# Patient Record
Sex: Female | Born: 1940
Health system: Southern US, Community
[De-identification: ages and names within clinical notes are randomized; demographics above are authoritative.]

## PROBLEM LIST (undated history)

## (undated) DIAGNOSIS — M1990 Unspecified osteoarthritis, unspecified site: Secondary | ICD-10-CM

## (undated) DIAGNOSIS — E162 Hypoglycemia, unspecified: Secondary | ICD-10-CM

## (undated) DIAGNOSIS — IMO0002 Reserved for concepts with insufficient information to code with codable children: Secondary | ICD-10-CM

## (undated) DIAGNOSIS — K589 Irritable bowel syndrome without diarrhea: Secondary | ICD-10-CM

## (undated) DIAGNOSIS — K22711 Barrett's esophagus with high grade dysplasia: Secondary | ICD-10-CM

## (undated) DIAGNOSIS — I959 Hypotension, unspecified: Secondary | ICD-10-CM

## (undated) DIAGNOSIS — I493 Ventricular premature depolarization: Secondary | ICD-10-CM

## (undated) DIAGNOSIS — Z87440 Personal history of urinary (tract) infections: Secondary | ICD-10-CM

## (undated) DIAGNOSIS — K449 Diaphragmatic hernia without obstruction or gangrene: Secondary | ICD-10-CM

## (undated) DIAGNOSIS — M810 Age-related osteoporosis without current pathological fracture: Secondary | ICD-10-CM

## (undated) DIAGNOSIS — M8580 Other specified disorders of bone density and structure, unspecified site: Secondary | ICD-10-CM

## (undated) DIAGNOSIS — N390 Urinary tract infection, site not specified: Secondary | ICD-10-CM

## (undated) DIAGNOSIS — I7300 Raynaud's syndrome without gangrene: Secondary | ICD-10-CM

## (undated) DIAGNOSIS — K2270 Barrett's esophagus without dysplasia: Secondary | ICD-10-CM

## (undated) DIAGNOSIS — E739 Lactose intolerance, unspecified: Secondary | ICD-10-CM

## (undated) DIAGNOSIS — D6851 Activated protein C resistance: Secondary | ICD-10-CM

## (undated) DIAGNOSIS — K529 Noninfective gastroenteritis and colitis, unspecified: Secondary | ICD-10-CM

## (undated) DIAGNOSIS — M419 Scoliosis, unspecified: Secondary | ICD-10-CM

## (undated) HISTORY — DX: Activated protein C resistance: D68.51

## (undated) HISTORY — DX: Urinary tract infection, site not specified: N39.0

## (undated) HISTORY — DX: Ventricular premature depolarization: I49.3

## (undated) HISTORY — DX: Hypotension, unspecified: I95.9

## (undated) HISTORY — DX: Barrett's esophagus with high grade dysplasia: K22.711

## (undated) HISTORY — DX: Hypoglycemia, unspecified: E16.2

## (undated) HISTORY — PX: BREAST EXCISIONAL BIOPSY: SUR124

## (undated) HISTORY — DX: Personal history of urinary (tract) infections: Z87.440

## (undated) HISTORY — DX: *Unknown: ERROR EN

## (undated) HISTORY — DX: Other specified disorders of bone density and structure, unspecified site: M85.80

## (undated) HISTORY — DX: Unspecified osteoarthritis, unspecified site: M19.90

---
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
## 1997-10-25 ENCOUNTER — Ambulatory Visit (HOSPITAL_COMMUNITY): Admission: RE | Admit: 1997-10-25 | Discharge: 1997-10-25 | Payer: Self-pay | Admitting: *Deleted

## 1998-06-23 ENCOUNTER — Other Ambulatory Visit: Admission: RE | Admit: 1998-06-23 | Discharge: 1998-06-23 | Payer: Self-pay | Admitting: *Deleted

## 1998-12-13 ENCOUNTER — Ambulatory Visit (HOSPITAL_COMMUNITY): Admission: RE | Admit: 1998-12-13 | Discharge: 1998-12-13 | Payer: Self-pay | Admitting: Internal Medicine

## 1998-12-13 ENCOUNTER — Encounter: Payer: Self-pay | Admitting: Internal Medicine

## 1998-12-22 ENCOUNTER — Other Ambulatory Visit: Admission: RE | Admit: 1998-12-22 | Discharge: 1998-12-22 | Payer: Self-pay | Admitting: *Deleted

## 1999-06-21 ENCOUNTER — Encounter: Payer: Self-pay | Admitting: Internal Medicine

## 1999-06-21 ENCOUNTER — Encounter: Admission: RE | Admit: 1999-06-21 | Discharge: 1999-06-21 | Payer: Self-pay | Admitting: Internal Medicine

## 1999-07-07 ENCOUNTER — Other Ambulatory Visit: Admission: RE | Admit: 1999-07-07 | Discharge: 1999-07-07 | Payer: Self-pay | Admitting: *Deleted

## 2000-06-21 ENCOUNTER — Encounter: Payer: Self-pay | Admitting: Internal Medicine

## 2000-06-21 ENCOUNTER — Encounter: Admission: RE | Admit: 2000-06-21 | Discharge: 2000-06-21 | Payer: Self-pay | Admitting: Internal Medicine

## 2000-07-05 ENCOUNTER — Other Ambulatory Visit: Admission: RE | Admit: 2000-07-05 | Discharge: 2000-07-05 | Payer: Self-pay | Admitting: *Deleted

## 2001-02-19 ENCOUNTER — Encounter (INDEPENDENT_AMBULATORY_CARE_PROVIDER_SITE_OTHER): Payer: Self-pay

## 2001-02-19 ENCOUNTER — Ambulatory Visit (HOSPITAL_BASED_OUTPATIENT_CLINIC_OR_DEPARTMENT_OTHER): Admission: RE | Admit: 2001-02-19 | Discharge: 2001-02-19 | Payer: Self-pay | Admitting: General Surgery

## 2001-06-24 ENCOUNTER — Encounter: Payer: Self-pay | Admitting: Internal Medicine

## 2001-06-24 ENCOUNTER — Encounter: Admission: RE | Admit: 2001-06-24 | Discharge: 2001-06-24 | Payer: Self-pay | Admitting: Internal Medicine

## 2001-07-08 ENCOUNTER — Other Ambulatory Visit: Admission: RE | Admit: 2001-07-08 | Discharge: 2001-07-08 | Payer: Self-pay | Admitting: *Deleted

## 2002-06-27 ENCOUNTER — Encounter: Payer: Self-pay | Admitting: Internal Medicine

## 2002-06-27 ENCOUNTER — Encounter: Admission: RE | Admit: 2002-06-27 | Discharge: 2002-06-27 | Payer: Self-pay | Admitting: Internal Medicine

## 2002-08-05 ENCOUNTER — Ambulatory Visit (HOSPITAL_COMMUNITY): Admission: RE | Admit: 2002-08-05 | Discharge: 2002-08-05 | Payer: Self-pay | Admitting: Internal Medicine

## 2002-08-05 ENCOUNTER — Encounter: Payer: Self-pay | Admitting: Internal Medicine

## 2003-03-27 ENCOUNTER — Ambulatory Visit (HOSPITAL_COMMUNITY): Admission: RE | Admit: 2003-03-27 | Discharge: 2003-03-27 | Payer: Self-pay | Admitting: Gastroenterology

## 2003-03-27 ENCOUNTER — Encounter (INDEPENDENT_AMBULATORY_CARE_PROVIDER_SITE_OTHER): Payer: Self-pay | Admitting: *Deleted

## 2003-06-30 ENCOUNTER — Encounter: Admission: RE | Admit: 2003-06-30 | Discharge: 2003-06-30 | Payer: Self-pay | Admitting: Internal Medicine

## 2003-09-01 ENCOUNTER — Encounter: Admission: RE | Admit: 2003-09-01 | Discharge: 2003-09-01 | Payer: Self-pay | Admitting: Internal Medicine

## 2003-09-01 ENCOUNTER — Other Ambulatory Visit: Admission: RE | Admit: 2003-09-01 | Discharge: 2003-09-01 | Payer: Self-pay | Admitting: Internal Medicine

## 2004-08-31 ENCOUNTER — Other Ambulatory Visit: Admission: RE | Admit: 2004-08-31 | Discharge: 2004-08-31 | Payer: Self-pay | Admitting: Internal Medicine

## 2004-09-01 ENCOUNTER — Encounter: Admission: RE | Admit: 2004-09-01 | Discharge: 2004-09-01 | Payer: Self-pay | Admitting: Internal Medicine

## 2004-10-13 ENCOUNTER — Encounter: Admission: RE | Admit: 2004-10-13 | Discharge: 2004-10-13 | Payer: Self-pay | Admitting: Gastroenterology

## 2004-10-20 ENCOUNTER — Encounter: Admission: RE | Admit: 2004-10-20 | Discharge: 2004-10-20 | Payer: Self-pay | Admitting: Gastroenterology

## 2004-11-15 ENCOUNTER — Encounter: Admission: RE | Admit: 2004-11-15 | Discharge: 2004-11-15 | Payer: Self-pay | Admitting: Internal Medicine

## 2005-11-17 ENCOUNTER — Encounter: Admission: RE | Admit: 2005-11-17 | Discharge: 2005-11-17 | Payer: Self-pay | Admitting: Internal Medicine

## 2006-11-20 ENCOUNTER — Encounter: Admission: RE | Admit: 2006-11-20 | Discharge: 2006-11-20 | Payer: Self-pay | Admitting: Family Medicine

## 2007-11-21 ENCOUNTER — Encounter: Admission: RE | Admit: 2007-11-21 | Discharge: 2007-11-21 | Payer: Self-pay | Admitting: Family Medicine

## 2008-11-23 ENCOUNTER — Ambulatory Visit: Payer: Self-pay | Admitting: Diagnostic Radiology

## 2008-11-23 ENCOUNTER — Ambulatory Visit (HOSPITAL_BASED_OUTPATIENT_CLINIC_OR_DEPARTMENT_OTHER): Admission: RE | Admit: 2008-11-23 | Discharge: 2008-11-23 | Payer: Self-pay | Admitting: Family Medicine

## 2009-12-21 ENCOUNTER — Ambulatory Visit: Payer: Self-pay | Admitting: Diagnostic Radiology

## 2009-12-21 ENCOUNTER — Ambulatory Visit (HOSPITAL_BASED_OUTPATIENT_CLINIC_OR_DEPARTMENT_OTHER): Admission: RE | Admit: 2009-12-21 | Discharge: 2009-12-21 | Payer: Self-pay | Admitting: Family Medicine

## 2010-12-12 ENCOUNTER — Other Ambulatory Visit (HOSPITAL_BASED_OUTPATIENT_CLINIC_OR_DEPARTMENT_OTHER): Payer: Self-pay | Admitting: Family Medicine

## 2010-12-12 DIAGNOSIS — Z1231 Encounter for screening mammogram for malignant neoplasm of breast: Secondary | ICD-10-CM

## 2011-01-02 ENCOUNTER — Ambulatory Visit (HOSPITAL_BASED_OUTPATIENT_CLINIC_OR_DEPARTMENT_OTHER)
Admission: RE | Admit: 2011-01-02 | Discharge: 2011-01-02 | Disposition: A | Discharge status: Home / Self Care | Payer: Medicare Other | Source: Ambulatory Visit | Attending: Family Medicine | Admitting: Family Medicine

## 2011-01-02 DIAGNOSIS — Z1231 Encounter for screening mammogram for malignant neoplasm of breast: Secondary | ICD-10-CM | POA: Insufficient documentation

## 2011-01-14 ENCOUNTER — Emergency Department (HOSPITAL_BASED_OUTPATIENT_CLINIC_OR_DEPARTMENT_OTHER)
Admission: EM | Admit: 2011-01-14 | Discharge: 2011-01-14 | Disposition: A | Discharge status: Home / Self Care | Payer: Medicare Other | Attending: Emergency Medicine | Admitting: Emergency Medicine

## 2011-01-14 ENCOUNTER — Encounter: Payer: Self-pay | Admitting: Emergency Medicine

## 2011-01-14 DIAGNOSIS — K589 Irritable bowel syndrome without diarrhea: Secondary | ICD-10-CM | POA: Insufficient documentation

## 2011-01-14 DIAGNOSIS — T6391XA Toxic effect of contact with unspecified venomous animal, accidental (unintentional), initial encounter: Secondary | ICD-10-CM | POA: Insufficient documentation

## 2011-01-14 DIAGNOSIS — T63441A Toxic effect of venom of bees, accidental (unintentional), initial encounter: Secondary | ICD-10-CM

## 2011-01-14 DIAGNOSIS — T63461A Toxic effect of venom of wasps, accidental (unintentional), initial encounter: Secondary | ICD-10-CM | POA: Insufficient documentation

## 2011-01-14 HISTORY — DX: Irritable bowel syndrome, unspecified: K58.9

## 2011-01-14 HISTORY — DX: Noninfective gastroenteritis and colitis, unspecified: K52.9

## 2011-01-14 MED ORDER — PREDNISONE 50 MG PO TABS: 50.0000 mg | ORAL_TABLET | Freq: Every day | ORAL | Status: AC

## 2011-01-14 MED ORDER — PREDNISONE 20 MG PO TABS
60.0000 mg | ORAL_TABLET | Freq: Once | ORAL | Status: AC
  Administered 2011-01-14: 60 mg via ORAL
  Filled 2011-01-14: qty 3

## 2011-01-14 NOTE — ED Notes (Signed)
Pt presents with bee sing over both arms with 2-3 inch diameter red raised sites with increased itching x 24hrs

## 2011-01-14 NOTE — ED Provider Notes (Signed)
History     CSN: [id number] Arrival date & time: 01/14/2011  4:19 PM  Chief Complaint  Patient presents with  . Insect Bite    Pt reports being stung by approximately 7 yellow jackets  with large red welps forming over 1 hr at site with no SOB   Patient is a 70 y.o. female presenting with allergic reaction. The history is provided by the patient.  Allergic Reaction The primary symptoms are  rash and urticaria. The primary symptoms do not include wheezing, shortness of breath, cough, diarrhea, dizziness, altered mental status or angioedema. The current episode started yesterday. The problem has been gradually worsening.  The rash is associated with itching.  The onset of the reaction was associated with insect bite/sting. Significant symptoms also include itching.  she denies known h/o severe allergic rxn Reports she was stung by multiple bees  Past Medical History  Diagnosis Date  . Colitis   . IBS (irritable bowel syndrome)     No past surgical history on file.  Family History  Problem Relation Age of Onset  . Cancer Mother   . Cancer Father     History  Substance Use Topics  . Smoking status: Never Smoker   . Smokeless tobacco: Not on file  . Alcohol Use: 0.6 oz/week    1 Glasses of wine per week    OB History    Grav Para Term Preterm Abortions TAB SAB Ect Mult Living                  Review of Systems  Respiratory: Negative for cough, shortness of breath and wheezing.   Gastrointestinal: Negative for diarrhea.  Skin: Positive for itching and rash.  Neurological: Negative for dizziness.  Psychiatric/Behavioral: Negative for altered mental status.    Physical Exam  BP 129/57  Pulse 78  Temp(Src) 97.7 F (36.5 C) (Oral)  Resp 22  SpO2 99%  Physical Exam  Constitutional: well developed, well nourished, no distress Head and Face: normocephalic/atraumatic Eyes: EOMI/PERRL ENMT: mucous membranes moist, uvula midline, no angioedema noted Neck: supple, no  meningeal signs CV: no murmur/rubs/gallops noted Lungs: clear to auscultation bilaterally Abd: soft, nontender GU: normal appearance Extremities: full ROM noted, pulses normal/equal Neuro: awake/alert, no distress, appropriate for age, [id number], no lethargy is noted Skin: area of urticaria to each bicep, and right LE.  No rash to chest/abd/face/neck Psych: appropriate for age  ED Course  Procedures  MDM Nursing notes reviewed and considered in documentation Pt already taking benadryl, will give burst of prednisone Pt reports reaction slowly worsened over past 24 hours No signs of anaphylaxis      Joya Gaskins, MD 01/14/11 1742

## 2011-06-13 HISTORY — PX: ABDOMINAL HYSTERECTOMY: SHX81

## 2011-10-16 ENCOUNTER — Other Ambulatory Visit: Payer: Self-pay | Admitting: Family Medicine

## 2011-10-16 DIAGNOSIS — Z9289 Personal history of other medical treatment: Secondary | ICD-10-CM

## 2011-10-16 DIAGNOSIS — N63 Unspecified lump in unspecified breast: Secondary | ICD-10-CM

## 2011-10-16 HISTORY — DX: Personal history of other medical treatment: Z92.89

## 2011-10-16 HISTORY — PX: OTHER SURGICAL HISTORY: SHX169

## 2011-10-17 ENCOUNTER — Ambulatory Visit
Admission: RE | Admit: 2011-10-17 | Discharge: 2011-10-17 | Disposition: A | Discharge status: Home / Self Care | Payer: Medicare Other | Source: Ambulatory Visit | Attending: Family Medicine | Admitting: Family Medicine

## 2011-10-17 DIAGNOSIS — N63 Unspecified lump in unspecified breast: Secondary | ICD-10-CM

## 2011-11-22 ENCOUNTER — Other Ambulatory Visit: Payer: Self-pay | Admitting: Family Medicine

## 2011-11-22 DIAGNOSIS — Z1231 Encounter for screening mammogram for malignant neoplasm of breast: Secondary | ICD-10-CM

## 2011-11-23 HISTORY — PX: COLONOSCOPY: SHX174

## 2012-01-03 ENCOUNTER — Ambulatory Visit
Admission: RE | Admit: 2012-01-03 | Discharge: 2012-01-03 | Disposition: A | Discharge status: Home / Self Care | Payer: Medicare Other | Source: Ambulatory Visit | Attending: Family Medicine | Admitting: Family Medicine

## 2012-01-03 DIAGNOSIS — Z1231 Encounter for screening mammogram for malignant neoplasm of breast: Secondary | ICD-10-CM

## 2012-05-14 DIAGNOSIS — N8111 Cystocele, midline: Secondary | ICD-10-CM | POA: Insufficient documentation

## 2012-06-24 DIAGNOSIS — D499 Neoplasm of unspecified behavior of unspecified site: Secondary | ICD-10-CM | POA: Insufficient documentation

## 2012-06-24 DIAGNOSIS — M79604 Pain in right leg: Secondary | ICD-10-CM | POA: Insufficient documentation

## 2012-11-26 ENCOUNTER — Other Ambulatory Visit: Payer: Self-pay

## 2012-11-26 DIAGNOSIS — Z1231 Encounter for screening mammogram for malignant neoplasm of breast: Secondary | ICD-10-CM

## 2013-01-16 DIAGNOSIS — M179 Osteoarthritis of knee, unspecified: Secondary | ICD-10-CM | POA: Insufficient documentation

## 2013-01-16 DIAGNOSIS — M1712 Unilateral primary osteoarthritis, left knee: Secondary | ICD-10-CM | POA: Insufficient documentation

## 2013-01-16 DIAGNOSIS — M2240 Chondromalacia patellae, unspecified knee: Secondary | ICD-10-CM | POA: Insufficient documentation

## 2013-01-20 ENCOUNTER — Ambulatory Visit
Admission: RE | Admit: 2013-01-20 | Discharge: 2013-01-20 | Disposition: A | Discharge status: Home / Self Care | Payer: Medicare Other | Source: Ambulatory Visit

## 2013-01-20 DIAGNOSIS — Z1231 Encounter for screening mammogram for malignant neoplasm of breast: Secondary | ICD-10-CM

## 2013-08-03 ENCOUNTER — Encounter (HOSPITAL_BASED_OUTPATIENT_CLINIC_OR_DEPARTMENT_OTHER): Payer: Self-pay | Admitting: Emergency Medicine

## 2013-08-03 ENCOUNTER — Emergency Department (HOSPITAL_BASED_OUTPATIENT_CLINIC_OR_DEPARTMENT_OTHER)
Admission: EM | Admit: 2013-08-03 | Discharge: 2013-08-03 | Disposition: A | Discharge status: Home / Self Care | Payer: Medicare Other | Attending: Emergency Medicine | Admitting: Emergency Medicine

## 2013-08-03 DIAGNOSIS — K589 Irritable bowel syndrome without diarrhea: Secondary | ICD-10-CM | POA: Insufficient documentation

## 2013-08-03 DIAGNOSIS — Z91040 Latex allergy status: Secondary | ICD-10-CM | POA: Insufficient documentation

## 2013-08-03 DIAGNOSIS — N390 Urinary tract infection, site not specified: Secondary | ICD-10-CM | POA: Insufficient documentation

## 2013-08-03 DIAGNOSIS — Z79899 Other long term (current) drug therapy: Secondary | ICD-10-CM | POA: Insufficient documentation

## 2013-08-03 DIAGNOSIS — Z8679 Personal history of other diseases of the circulatory system: Secondary | ICD-10-CM | POA: Insufficient documentation

## 2013-08-03 DIAGNOSIS — IMO0002 Reserved for concepts with insufficient information to code with codable children: Secondary | ICD-10-CM | POA: Insufficient documentation

## 2013-08-03 DIAGNOSIS — Z8739 Personal history of other diseases of the musculoskeletal system and connective tissue: Secondary | ICD-10-CM | POA: Insufficient documentation

## 2013-08-03 DIAGNOSIS — Z880 Allergy status to penicillin: Secondary | ICD-10-CM | POA: Insufficient documentation

## 2013-08-03 HISTORY — DX: Scoliosis, unspecified: M41.9

## 2013-08-03 HISTORY — DX: Lactose intolerance, unspecified: E73.9

## 2013-08-03 HISTORY — DX: Raynaud's syndrome without gangrene: I73.00

## 2013-08-03 HISTORY — DX: Diaphragmatic hernia without obstruction or gangrene: K44.9

## 2013-08-03 HISTORY — DX: Reserved for concepts with insufficient information to code with codable children: IMO0002

## 2013-08-03 HISTORY — DX: Age-related osteoporosis without current pathological fracture: M81.0

## 2013-08-03 HISTORY — DX: Barrett's esophagus without dysplasia: K22.70

## 2013-08-03 LAB — CBC WITH DIFFERENTIAL/PLATELET
Basophils Absolute: 0.1 10*3/uL (ref 0.0–0.1)
Basophils Relative: 1 % (ref 0–1)
Eosinophils Absolute: 0 10*3/uL (ref 0.0–0.7)
Eosinophils Relative: 0 % (ref 0–5)
HCT: 41 % (ref 36.0–46.0)
Hemoglobin: 13.4 g/dL (ref 12.0–15.0)
Lymphocytes Relative: 23 % (ref 12–46)
Lymphs Abs: 1.5 10*3/uL (ref 0.7–4.0)
MCH: 32.2 pg (ref 26.0–34.0)
MCHC: 32.7 g/dL (ref 30.0–36.0)
MCV: 98.6 fL (ref 78.0–100.0)
Monocytes Absolute: 0.7 10*3/uL (ref 0.1–1.0)
Monocytes Relative: 11 % (ref 3–12)
Neutro Abs: 4.3 10*3/uL (ref 1.7–7.7)
Neutrophils Relative %: 65 % (ref 43–77)
Platelets: 280 10*3/uL (ref 150–400)
RBC: 4.16 MIL/uL (ref 3.87–5.11)
RDW: 12.7 % (ref 11.5–15.5)
WBC: 6.7 10*3/uL (ref 4.0–10.5)

## 2013-08-03 LAB — URINALYSIS, ROUTINE W REFLEX MICROSCOPIC
Bilirubin Urine: NEGATIVE
Glucose, UA: NEGATIVE mg/dL
Hgb urine dipstick: NEGATIVE
Ketones, ur: NEGATIVE mg/dL
Nitrite: POSITIVE — AB
Protein, ur: NEGATIVE mg/dL
Specific Gravity, Urine: 1.008 (ref 1.005–1.030)
Urobilinogen, UA: 1 mg/dL (ref 0.0–1.0)
pH: 6 (ref 5.0–8.0)

## 2013-08-03 LAB — BASIC METABOLIC PANEL
BUN: 12 mg/dL (ref 6–23)
CO2: 25 mEq/L (ref 19–32)
Calcium: 9.4 mg/dL (ref 8.4–10.5)
Chloride: 101 mEq/L (ref 96–112)
Creatinine, Ser: 0.7 mg/dL (ref 0.50–1.10)
GFR calc Af Amer: >90 mL/min (ref >90)
GFR calc non Af Amer: 86 mL/min — ABNORMAL LOW (ref >90)
Glucose, Bld: 118 mg/dL — ABNORMAL HIGH (ref 70–99)
Potassium: 4.5 mEq/L (ref 3.7–5.3)
Sodium: 140 mEq/L (ref 137–147)

## 2013-08-03 LAB — URINE MICROSCOPIC-ADD ON

## 2013-08-03 MED ORDER — CEFTRIAXONE SODIUM 1 G IJ SOLR
INTRAMUSCULAR | Status: AC
  Filled 2013-08-03: qty 10

## 2013-08-03 MED ORDER — DEXTROSE 5 % IV SOLN
1.0000 g | Freq: Once | INTRAVENOUS | Status: AC
  Administered 2013-08-03: 1 g via INTRAVENOUS

## 2013-08-03 MED ORDER — CEPHALEXIN 500 MG PO CAPS: 500.0000 mg | ORAL_CAPSULE | Freq: Four times a day (QID) | ORAL | Status: DC

## 2013-08-03 NOTE — Discharge Instructions (Signed)
Urinary Tract Infection  Urinary tract infections (UTIs) can develop anywhere along your urinary tract. Your urinary tract is your body's drainage system for removing wastes and extra water. Your urinary tract includes two kidneys, two ureters, a bladder, and a urethra. Your kidneys are a pair of bean-shaped organs. Each kidney is about the size of your fist. They are located below your ribs, one on each side of your spine.  CAUSES  Infections are caused by microbes, which are microscopic organisms, including fungi, viruses, and bacteria. These organisms are so small that they can only be seen through a microscope. Bacteria are the microbes that most commonly cause UTIs.  SYMPTOMS   Symptoms of UTIs may vary by age and gender of the patient and by the location of the infection. Symptoms in young women typically include a frequent and intense urge to urinate and a painful, burning feeling in the bladder or urethra during urination. Older women and men are more likely to be tired, shaky, and weak and have muscle aches and abdominal pain. A fever may mean the infection is in your kidneys. Other symptoms of a kidney infection include pain in your back or sides below the ribs, nausea, and vomiting.  DIAGNOSIS  To diagnose a UTI, your caregiver will ask you about your symptoms. Your caregiver also will ask to provide a urine sample. The urine sample will be tested for bacteria and white blood cells. White blood cells are made by your body to help fight infection.  TREATMENT   Typically, UTIs can be treated with medication. Because most UTIs are caused by a bacterial infection, they usually can be treated with the use of antibiotics. The choice of antibiotic and length of treatment depend on your symptoms and the type of bacteria causing your infection.  HOME CARE INSTRUCTIONS   If you were prescribed antibiotics, take them exactly as your caregiver instructs you. Finish the medication even if you feel better after you  have only taken some of the medication.   Drink enough water and fluids to keep your urine clear or pale yellow.   Avoid caffeine, tea, and carbonated beverages. They tend to irritate your bladder.   Empty your bladder often. Avoid holding urine for long periods of time.   Empty your bladder before and after sexual intercourse.   After a bowel movement, women should cleanse from front to back. Use each tissue only once.  SEEK MEDICAL CARE IF:    You have back pain.   You develop a fever.   Your symptoms do not begin to resolve within 3 days.  SEEK IMMEDIATE MEDICAL CARE IF:    You have severe back pain or lower abdominal pain.   You develop chills.   You have nausea or vomiting.   You have continued burning or discomfort with urination.  MAKE SURE YOU:    Understand these instructions.   Will watch your condition.   Will get help right away if you are not doing well or get worse.  Document Released: 03/08/2005 Document Revised: 11/28/2011 Document Reviewed: 07/07/2011  Exit[employer] Patient Information 2014 [employer].

## 2013-08-03 NOTE — ED Notes (Signed)
Dysuria since yesterday.  Pt taking azo standard which has helped some.  No known fever.

## 2013-08-03 NOTE — ED Provider Notes (Signed)
CSN: [id number]     Arrival date & time 08/03/13  1105 History   First MD Initiated Contact with Patient 08/03/13 1108     Chief Complaint  Patient presents with  . Dysuria     (Consider location/radiation/quality/duration/timing/severity/associated sxs/prior Treatment) HPI  This is a 73 year old female with a history of frequent UTIs, colitis, IBS who presents with dysuria. Patient reports one-day history of urinary burning and frequency. She is followed by urology at [facility]. Patient states that she took 2 doses of a "left over antibiotic" yesterday. It appears that it was Keflex. Patient denies any fevers, chills, abdominal pain. She denies any flank pain, and nausea, vomiting.  Past Medical History  Diagnosis Date  . Colitis   . IBS (irritable bowel syndrome)   . Hiatal hernia   . Barrett's esophagus   . Osteoporosis   . Scoliosis   . Raynaud's disease   . DDD (degenerative disc disease)   . Lactose intolerance    No past surgical history on file. Family History  Problem Relation Age of Onset  . Cancer Mother   . Cancer Father    History  Substance Use Topics  . Smoking status: Never Smoker   . Smokeless tobacco: Not on file  . Alcohol Use: 0.6 oz/week    1 Glasses of wine per week   OB History   Grav Para Term Preterm Abortions TAB SAB Ect Mult Living                 Review of Systems  Constitutional: Negative for fever.  Respiratory: Negative for chest tightness and shortness of breath.   Cardiovascular: Negative for chest pain.  Gastrointestinal: Negative for nausea, vomiting and abdominal pain.  Genitourinary: Positive for dysuria, urgency and frequency. Negative for flank pain.  Musculoskeletal: Negative for back pain.  Neurological: Negative for headaches.  Psychiatric/Behavioral: Negative for confusion.  All other systems reviewed and are negative.      Allergies  Aspirin; Avelox; Ciprofloxacin hcl; Levofloxacin;  Norfloxacin; Nsaids; Ofloxacin; Penicillins; Tequin; and Latex  Home Medications   Current Outpatient Rx  Name  Route  Sig  Dispense  Refill  . calcium-vitamin D (OSCAL WITH D) 500-200 MG-UNIT per tablet   Oral   Take 1 tablet by mouth 3 (three) times daily.           . cephALEXin (KEFLEX) 500 MG capsule   Oral   Take 500 mg by mouth 3 (three) times daily.           . Cholecalciferol (VITAMIN D) 2000 UNITS tablet   Oral   Take 2,000 Units by mouth daily.           . fluticasone (FLOVENT DISKUS) 50 MCG/BLIST diskus inhaler   Inhalation   Inhale 1 puff into the lungs 2 (two) times daily.         . Glucosamine 500 MG TABS   Oral   Take 1 tablet by mouth 3 (three) times daily.           Marland Kitchen lactase (LACTAID) 3000 UNITS tablet   Oral   Take 1 tablet by mouth 2 (two) times daily.           Earney Navy Bicarbonate (ZEGERID) 20-1100 MG CAPS   Oral   Take 1 capsule by mouth 2 (two) times daily.           . ValACYclovir HCl (VALTREX PO)   Oral   Take 1 tablet  by mouth daily.           . zoledronic acid (RECLAST) 5 MG/100ML SOLN injection   Intravenous   Inject 5 mg into the vein once.         . cephALEXin (KEFLEX) 500 MG capsule   Oral   Take 1 capsule (500 mg total) by mouth 4 (four) times daily.   28 capsule   0   . cycloSPORINE (RESTASIS) 0.05 % ophthalmic emulsion   Both Eyes   Place 1 drop into both eyes 2 (two) times daily.          . diphenhydrAMINE (BENADRYL) 25 MG tablet   Oral   Take 25 mg by mouth every 4 (four) hours as needed. For itching          . hydrOXYzine (ATARAX) 25 MG tablet   Oral   Take 50 mg by mouth every 4 (four) hours as needed. For itching           . Methenamine-Sodium Salicylate (CYSTEX) 008-676.1 MG TABS   Oral   Take 15 mLs by mouth daily.           . Multiple Vitamins-Minerals (MULTIVITAMIN WITH MINERALS) tablet   Oral   Take 1 tablet by mouth daily.            BP 118/68  Pulse 83   Temp(Src) 98.3 F (36.8 C) (Oral)  Resp 20  Ht 5\' 2"  (1.575 m)  Wt 140 lb (63.504 kg)  BMI 25.60 kg/m2  SpO2 98% Physical Exam  Nursing note and vitals reviewed. Constitutional: She is oriented to person, place, and time. No distress.  elderly  HENT:  Head: Normocephalic and atraumatic.  Neck: Neck supple.  Cardiovascular: Normal rate, regular rhythm and normal heart sounds.   No murmur heard. Pulmonary/Chest: Effort normal and breath sounds normal. No respiratory distress. She has no wheezes.  Abdominal: Soft. Bowel sounds are normal. There is no tenderness. There is no rebound and no guarding.  Neurological: She is alert and oriented to person, place, and time.  Skin: Skin is warm and dry.  Psychiatric: She has a normal mood and affect.    ED Course  Procedures (including critical care time) Labs Review Labs Reviewed  URINALYSIS, ROUTINE W REFLEX MICROSCOPIC - Abnormal; Notable for the following:    Color, Urine ORANGE (*)    Nitrite POSITIVE (*)    Leukocytes, UA TRACE (*)    All other components within normal limits  BASIC METABOLIC PANEL - Abnormal; Notable for the following:    Glucose, Bld 118 (*)    GFR calc non Af Amer 86 (*)    All other components within normal limits  CBC WITH DIFFERENTIAL  URINE MICROSCOPIC-ADD ON   Imaging Review No results found.  EKG Interpretation   None       MDM   Final diagnoses:  Urinary tract infection    Patient presents with symptoms consistent with prior UTIs. Has taken 2 doses of Keflex.  She does report some improvement. She is nontoxic-appearing. Lab work is reassuring. Urinalysis is nitrite positive.  I have reviewed patient's urine culture from [facility]. Urine sensitive to ceftriaxone. Patient was given dose of ceftriaxone. She had sensitivities to cephalosporins. Will discharge on Keflex. She is to followup with her urologist.  After history, exam, and medical workup I feel the patient has been appropriately  medically screened and is safe for discharge home. Pertinent diagnoses were discussed with the patient. Patient  was given return precautions.     Merryl Hacker, MD 08/03/13 1501

## 2013-08-04 LAB — URINE CULTURE
Colony Count: NO GROWTH
Culture: NO GROWTH

## 2014-01-20 ENCOUNTER — Other Ambulatory Visit: Payer: Self-pay

## 2014-01-20 DIAGNOSIS — Z1231 Encounter for screening mammogram for malignant neoplasm of breast: Secondary | ICD-10-CM

## 2014-02-04 ENCOUNTER — Ambulatory Visit: Payer: Medicare Other

## 2014-02-10 ENCOUNTER — Encounter (INDEPENDENT_AMBULATORY_CARE_PROVIDER_SITE_OTHER): Payer: Self-pay

## 2014-02-10 ENCOUNTER — Ambulatory Visit
Admission: RE | Admit: 2014-02-10 | Discharge: 2014-02-10 | Disposition: A | Discharge status: Home / Self Care | Payer: Medicare Other | Source: Ambulatory Visit

## 2014-02-10 DIAGNOSIS — Z1231 Encounter for screening mammogram for malignant neoplasm of breast: Secondary | ICD-10-CM

## 2014-11-02 HISTORY — PX: SIGMOIDOSCOPY: SHX6686

## 2015-01-13 ENCOUNTER — Other Ambulatory Visit: Payer: Self-pay

## 2015-01-13 DIAGNOSIS — Z1231 Encounter for screening mammogram for malignant neoplasm of breast: Secondary | ICD-10-CM

## 2015-02-22 ENCOUNTER — Ambulatory Visit
Admission: RE | Admit: 2015-02-22 | Discharge: 2015-02-22 | Disposition: A | Discharge status: Home / Self Care | Payer: Medicare Other | Source: Ambulatory Visit

## 2015-02-22 DIAGNOSIS — Z1231 Encounter for screening mammogram for malignant neoplasm of breast: Secondary | ICD-10-CM

## 2015-02-24 ENCOUNTER — Other Ambulatory Visit: Payer: Self-pay | Admitting: Family Medicine

## 2015-02-24 DIAGNOSIS — R928 Other abnormal and inconclusive findings on diagnostic imaging of breast: Secondary | ICD-10-CM

## 2015-03-12 ENCOUNTER — Ambulatory Visit
Admission: RE | Admit: 2015-03-12 | Discharge: 2015-03-12 | Disposition: A | Discharge status: Home / Self Care | Payer: Medicare Other | Source: Ambulatory Visit | Attending: Family Medicine | Admitting: Family Medicine

## 2015-03-12 ENCOUNTER — Other Ambulatory Visit: Payer: Self-pay | Admitting: Family Medicine

## 2015-03-12 DIAGNOSIS — R928 Other abnormal and inconclusive findings on diagnostic imaging of breast: Secondary | ICD-10-CM

## 2015-12-17 DIAGNOSIS — Z9889 Other specified postprocedural states: Secondary | ICD-10-CM

## 2015-12-17 HISTORY — DX: Other specified postprocedural states: Z98.890

## 2018-07-12 DIAGNOSIS — Z9289 Personal history of other medical treatment: Secondary | ICD-10-CM

## 2018-07-12 HISTORY — DX: Personal history of other medical treatment: Z92.89

## 2018-08-26 LAB — HM COLONOSCOPY

## 2018-11-18 ENCOUNTER — Other Ambulatory Visit: Payer: Self-pay | Admitting: Family Medicine

## 2019-02-11 ENCOUNTER — Other Ambulatory Visit: Payer: Self-pay | Admitting: Family Medicine

## 2019-02-11 DIAGNOSIS — N644 Mastodynia: Secondary | ICD-10-CM

## 2019-03-01 DIAGNOSIS — Z9289 Personal history of other medical treatment: Secondary | ICD-10-CM

## 2019-03-01 HISTORY — DX: Personal history of other medical treatment: Z92.89

## 2019-04-01 DIAGNOSIS — Z9289 Personal history of other medical treatment: Secondary | ICD-10-CM

## 2019-04-01 HISTORY — DX: Personal history of other medical treatment: Z92.89

## 2019-04-14 ENCOUNTER — Other Ambulatory Visit: Payer: Self-pay

## 2019-04-14 ENCOUNTER — Ambulatory Visit
Admission: RE | Admit: 2019-04-14 | Discharge: 2019-04-14 | Disposition: A | Discharge status: Home / Self Care | Payer: Medicare Other | Source: Ambulatory Visit | Attending: Family Medicine | Admitting: Family Medicine

## 2019-04-14 DIAGNOSIS — N644 Mastodynia: Secondary | ICD-10-CM

## 2019-04-14 DIAGNOSIS — Z9289 Personal history of other medical treatment: Secondary | ICD-10-CM

## 2019-04-14 HISTORY — PX: OTHER SURGICAL HISTORY: SHX169

## 2019-04-14 HISTORY — DX: Personal history of other medical treatment: Z92.89

## 2019-04-23 ENCOUNTER — Other Ambulatory Visit: Payer: Self-pay

## 2019-04-23 ENCOUNTER — Emergency Department (HOSPITAL_COMMUNITY): Payer: Medicare Other

## 2019-04-23 ENCOUNTER — Emergency Department (HOSPITAL_COMMUNITY)
Admission: EM | Admit: 2019-04-23 | Discharge: 2019-04-23 | Disposition: A | Discharge status: Home / Self Care | Payer: Medicare Other | Attending: Emergency Medicine | Admitting: Emergency Medicine

## 2019-04-23 ENCOUNTER — Encounter (HOSPITAL_COMMUNITY): Payer: Self-pay

## 2019-04-23 DIAGNOSIS — Z91040 Latex allergy status: Secondary | ICD-10-CM | POA: Diagnosis not present

## 2019-04-23 DIAGNOSIS — R413 Other amnesia: Secondary | ICD-10-CM | POA: Diagnosis not present

## 2019-04-23 DIAGNOSIS — Z79899 Other long term (current) drug therapy: Secondary | ICD-10-CM | POA: Insufficient documentation

## 2019-04-23 DIAGNOSIS — R4182 Altered mental status, unspecified: Secondary | ICD-10-CM | POA: Diagnosis present

## 2019-04-23 LAB — CBC WITH DIFFERENTIAL/PLATELET
Abs Immature Granulocytes: 0.03 10*3/uL (ref 0.00–0.07)
Basophils Absolute: 0.1 10*3/uL (ref 0.0–0.1)
Basophils Relative: 1 %
Eosinophils Absolute: 0.1 10*3/uL (ref 0.0–0.5)
Eosinophils Relative: 1 %
HCT: 38.5 % (ref 36.0–46.0)
Hemoglobin: 12.2 g/dL (ref 12.0–15.0)
Immature Granulocytes: 0 %
Lymphocytes Relative: 26 %
Lymphs Abs: 2.3 10*3/uL (ref 0.7–4.0)
MCH: 32 pg (ref 26.0–34.0)
MCHC: 31.7 g/dL (ref 30.0–36.0)
MCV: 101 fL — ABNORMAL HIGH (ref 80.0–100.0)
Monocytes Absolute: 1.1 10*3/uL — ABNORMAL HIGH (ref 0.1–1.0)
Monocytes Relative: 12 %
Neutro Abs: 5.3 10*3/uL (ref 1.7–7.7)
Neutrophils Relative %: 60 %
Platelets: 251 10*3/uL (ref 150–400)
RBC: 3.81 MIL/uL — ABNORMAL LOW (ref 3.87–5.11)
RDW: 12.5 % (ref 11.5–15.5)
WBC: 9 10*3/uL (ref 4.0–10.5)
nRBC: 0 % (ref 0.0–0.2)

## 2019-04-23 LAB — BASIC METABOLIC PANEL
Anion gap: 7 (ref 5–15)
BUN: 9 mg/dL (ref 8–23)
CO2: 25 mmol/L (ref 22–32)
Calcium: 8.4 mg/dL — ABNORMAL LOW (ref 8.9–10.3)
Chloride: 102 mmol/L (ref 98–111)
Creatinine, Ser: 0.86 mg/dL (ref 0.44–1.00)
GFR calc Af Amer: >60 mL/min (ref >60)
GFR calc non Af Amer: >60 mL/min (ref >60)
Glucose, Bld: 100 mg/dL — ABNORMAL HIGH (ref 70–99)
Potassium: 4.2 mmol/L (ref 3.5–5.1)
Sodium: 134 mmol/L — ABNORMAL LOW (ref 135–145)

## 2019-04-23 NOTE — Discharge Instructions (Addendum)
The testing today is reassuring.  There is no sign of brain tumor or dementia.  Make sure you are taking your usual medicines, getting plenty of rest and eating 3 good meals each day.  Call your PCP for a follow-up appointment to discuss further testing and treatment which may be needed.  She may prefer to send you to a neurologist as well.

## 2019-04-23 NOTE — ED Triage Notes (Signed)
Pt states her and husband went for walk, came back and began reading a book, and could not remember her place in the book, Husband states Pt told him she could not remember taking a walk and memory of the morning.

## 2019-04-23 NOTE — ED Provider Notes (Signed)
[facility] DEPT Provider Note   CSN: [id number] Arrival date & time: 04/23/19  1228     History   Chief Complaint Chief Complaint  Patient presents with  . AMS    HPI Carol Perez is a 78 y.o. female.     Patient is a 78 year old female with a history of IBS, arthritis, colitis, factor V Leiden disease on aspirin who presents today with her husband for abrupt onset of confusion.  Husband states everything was fine this morning and they gone walking in the halls when approximately 45 minutes prior to arrival she came in and told him something was wrong and she could not remember anything.  He states she could not remember anything that happened earlier today her birthdate her phone number or other information that she would normally know.  Of note patient had a similar episode approximately 6 weeks ago and they went to [facility] for this.  Her symptoms did resolve and she went home and had an outpatient work-up with MRI, EEG, carotid ultrasounds, and echo which have all been normal.  Patient has been feeling well until this episode today 45 minutes prior to arrival.  Patient denies any pain, dizziness, lightheadedness, palpitations.  Husband denies any facial droop and patient denies any unilateral weakness numbness or coordination problems.  The history is provided by the patient and the spouse.    Past Medical History:  Diagnosis Date  . Barrett's esophagus   . Colitis   . DDD (degenerative disc disease)   . Hiatal hernia   . IBS (irritable bowel syndrome)   . Lactose intolerance   . Osteoporosis   . Raynaud's disease   . Scoliosis     There are no active problems to display for this patient.   Past Surgical History:  Procedure Laterality Date  . BREAST EXCISIONAL BIOPSY Left 30+ yrs ago     OB History   No obstetric history on file.      Home Medications    Prior to Admission medications   Medication Sig Start Date End  Date Taking? Authorizing Provider  calcium-vitamin D (OSCAL WITH D) 500-200 MG-UNIT per tablet Take 1 tablet by mouth 3 (three) times daily.      [provider]  cephALEXin (KEFLEX) 500 MG capsule Take 500 mg by mouth 3 (three) times daily.      [provider]  cephALEXin (KEFLEX) 500 MG capsule Take 1 capsule (500 mg total) by mouth 4 (four) times daily. 08/03/13   Horton, Barbette Hair, MD  Cholecalciferol (VITAMIN D) 2000 UNITS tablet Take 2,000 Units by mouth daily.      [provider]  cycloSPORINE (RESTASIS) 0.05 % ophthalmic emulsion Place 1 drop into both eyes 2 (two) times daily.     [provider]  diphenhydrAMINE (BENADRYL) 25 MG tablet Take 25 mg by mouth every 4 (four) hours as needed. For itching     [provider]  fluticasone (FLOVENT DISKUS) 50 MCG/BLIST diskus inhaler Inhale 1 puff into the lungs 2 (two) times daily.    [provider]  Glucosamine 500 MG TABS Take 1 tablet by mouth 3 (three) times daily.      [provider]  hydrOXYzine (ATARAX) 25 MG tablet Take 50 mg by mouth every 4 (four) hours as needed. For itching      [provider]  lactase (LACTAID) 3000 UNITS tablet Take 1 tablet by mouth 2 (two) times daily.  [provider]  Methenamine-Sodium Salicylate (CYSTEX) XX123456 MG TABS Take 15 mLs by mouth daily.      [provider]  Multiple Vitamins-Minerals (MULTIVITAMIN WITH MINERALS) tablet Take 1 tablet by mouth daily.      [provider]  Omeprazole-Sodium Bicarbonate (ZEGERID) 20-1100 MG CAPS Take 1 capsule by mouth 2 (two) times daily.      [provider]  ValACYclovir HCl (VALTREX PO) Take 1 tablet by mouth daily.      [provider]  zoledronic acid (RECLAST) 5 MG/100ML SOLN injection Inject 5 mg into the vein once.    [provider]    Family History Family History  Problem Relation Age of Onset  . Cancer Mother   .  Cancer Father     Social History Social History   Tobacco Use  . Smoking status: Never Smoker  . Smokeless tobacco: Never Used  Substance Use Topics  . Alcohol use: Yes    Alcohol/week: 1.0 standard drinks    Types: 1 Glasses of wine per week  . Drug use: No     Allergies   Aspirin, Avelox [moxifloxacin], Ciprofloxacin hcl, Levofloxacin, Norfloxacin, Nsaids, Ofloxacin, Penicillins, Tequin [gatifloxacin], and Latex   Review of Systems Review of Systems  All other systems reviewed and are negative.    Physical Exam Updated Vital Signs BP 139/70   Pulse 71   Temp 97.9 F (36.6 C) (Oral)   Resp 16   Ht 5\' 2"  (1.575 m)   Wt 61.2 kg   SpO2 100%   BMI 24.69 kg/m   Physical Exam Vitals signs and nursing note reviewed.  Constitutional:      General: She is not in acute distress.    Appearance: Normal appearance. She is well-developed and normal weight.  HENT:     Head: Normocephalic and atraumatic.  Eyes:     General: No visual field deficit.    Pupils: Pupils are equal, round, and reactive to light.  Cardiovascular:     Rate and Rhythm: Normal rate and regular rhythm.     Heart sounds: Normal heart sounds. No murmur. No friction rub.  Pulmonary:     Effort: Pulmonary effort is normal.     Breath sounds: Normal breath sounds. No wheezing or rales.  Abdominal:     General: Bowel sounds are normal. There is no distension.     Palpations: Abdomen is soft.     Tenderness: There is no abdominal tenderness. There is no guarding or rebound.  Musculoskeletal: Normal range of motion.        General: No tenderness.     Comments: No edema  Skin:    General: Skin is warm and dry.     Findings: No rash.  Neurological:     Mental Status: She is alert.     Cranial Nerves: No cranial nerve deficit, dysarthria or facial asymmetry.     Sensory: No sensory deficit.     Motor: Motor function is intact. No weakness or pronator drift.     Coordination: Coordination is intact.  Coordination normal. Finger-Nose-Finger Test and Heel to [facility] Test normal.     Gait: Gait is intact.     Comments: Oriented to person place and time.  Still having difficulty recalling short-term memory like what she did earlier today or test that room runs 6 weeks ago.  Psychiatric:        Mood and Affect: Mood normal.        Behavior:  Behavior normal.        Thought Content: Thought content normal.      ED Treatments / Results  Labs (all labs ordered are listed, but only abnormal results are displayed) Labs Reviewed  CBC WITH DIFFERENTIAL/PLATELET  BASIC METABOLIC PANEL  CBG MONITORING, ED    EKG EKG Interpretation  Date/Time:  Wednesday April 23 2019 12:43:35 EST Ventricular Rate:  74 PR Interval:    QRS Duration: 97 QT Interval:  433 QTC Calculation: 481 R Axis:   -13 Text Interpretation: Sinus rhythm Low voltage, precordial leads Borderline repolarization abnormality No previous tracing Artifact Confirmed by Blanchie Dessert [phone]) on 04/23/2019 1:08:27 PM   Radiology No results found.  Procedures Procedures (including critical care time)  Medications Ordered in ED Medications - No data to display   Initial Impression / Assessment and Plan / ED Course  I have reviewed the triage vital signs and the nursing notes.  Pertinent labs & imaging results that were available during my care of the patient were reviewed by me and considered in my medical decision making (see chart for details).        Elderly female presenting today with an acute loss of memory.  Similar symptoms approximately 6 weeks ago without definitive answers.  Patient had multiple testing done including MRI, EEG, echo, carotid Doppler, zio patch all which were reassuring.  Had been a normal day today until symptoms started.  Symptoms are starting to clear now but she still is not able to remember going to the hospital 6 weeks ago or any of the outpatient testing.  She is oriented to person  place and time.  She is able to recall her birthdate and her phone number.  She has no focal deficits on exam.  Blood sugar here today is within normal limits and vital signs are normal.  Do not feel that this is a code stroke.  Will discuss with neurology for further recommendations.  2:28 PM Labs without acute findings.  Patient does report she is also on macrobid and is not sure if that is relevant for recent UTI.  Pt is still having persistent confusion but states now it is waxing and waning.  Spoke with Dr. Sandrea Matte who recommened repeat MRI to r/o stroke but also possible atypical sz.  Final Clinical Impressions(s) / ED Diagnoses   Final diagnoses:  None    ED Discharge Orders    None       Blanchie Dessert, MD 04/23/19 1431

## 2019-04-23 NOTE — ED Provider Notes (Signed)
4:45 PM-checkout from Dr. Maryan Rued to evaluate patient after MRI imaging of the brain.  This is returned and is normal for age.    Patient Vitals for the past 24 hrs:  BP Temp Temp src Pulse Resp SpO2 Height Weight  04/23/19 1658 128/68 - - 66 16 98 % - -  04/23/19 1530 127/63 - - 69 13 97 % - -  04/23/19 1500 127/63 - - 69 13 97 % - -  04/23/19 1445 - - - 71 15 97 % - -  04/23/19 1430 128/70 - - 73 20 97 % - -  04/23/19 1400 129/61 - - 70 14 97 % - -  04/23/19 1345 - - - 70 16 97 % - -  04/23/19 1330 134/62 - - 69 10 98 % - -  04/23/19 1315 - - - 71 (!) 29 99 % - -  04/23/19 1300 131/63 - - 72 18 98 % - -  04/23/19 1257 139/70 97.9 F (36.6 C) Oral 71 16 100 % 5' 2" (1.575 m) 61.2 kg    6:09 PM Reevaluation with update and discussion. After initial assessment and treatment, an updated evaluation reveals at this time the patient is alert, and conversant and states that her memory has almost completely recovered for the events of today.  She feels like this is a fairly similar episode to one that she had a couple of months ago when she was hospitalized and evaluated comprehensively.  She states that she did not see the neurologist as an outpatient following hospitalization but did talk to her on the phone regarding her MRI results then.  She is also met with her PCP since then, and been referred to cardiology who has done a work-up.  She was given reassuring results.  The patient lives in [place], and gets most of her medical care in [place], [place].  We discussed the findings and plan and the patient states that she will follow-up with her PCP for further evaluation and treatment as needed.  During my time with her and her husband, I did notice that the patient had a somewhat short temper and became agitated when her husband was asking her questions while she was thinking about something.  All questions answered. [place] Decision Making: Episode of memory loss,  recurrent.  Evaluation here is reassuring.  No indication for hospitalization at this time.  I doubt that she has had a stroke or is having encephalopathy.  There is no indication for hospitalization at this time.  CRITICAL CARE-no Performed by: Daleen Bo  Nursing Notes Reviewed/ Care Coordinated Applicable Imaging Reviewed Interpretation of Laboratory Data incorporated into ED treatment  The patient appears reasonably screened and/or stabilized for discharge and I doubt any other medical condition or other [facility] requiring further screening, evaluation, or treatment in the ED at this time prior to discharge.  Plan: Home Medications-continue usual; Home Treatments-rest, fluids; return here if the recommended treatment, does not improve the symptoms; Recommended follow up-PCP follow-up for further evaluation treatment in 1 or so weeks.    Daleen Bo, MD 04/23/19 [phone]

## 2019-04-23 NOTE — ED Triage Notes (Signed)
Patient was brought in by her husband. Patient's husband reports that the patient had sudden memory loss approx 45 minutes ago.  Patient is a resident of [facility].  Husband reports that the patient walked up to him and stated, "something is wrong. I can not remember anything."

## 2019-04-23 NOTE — ED Notes (Signed)
Pure wick has been placed. Suction set to 45mmHg.  

## 2019-05-01 DIAGNOSIS — Z780 Asymptomatic menopausal state: Secondary | ICD-10-CM | POA: Insufficient documentation

## 2019-05-01 DIAGNOSIS — K219 Gastro-esophageal reflux disease without esophagitis: Secondary | ICD-10-CM | POA: Insufficient documentation

## 2019-06-25 ENCOUNTER — Other Ambulatory Visit: Payer: Self-pay

## 2019-06-25 ENCOUNTER — Non-Acute Institutional Stay: Payer: Medicare Other | Admitting: Internal Medicine

## 2019-06-25 VITALS — BP 138/80 | Temp 98.5°F | Ht 62.0 in | Wt 138.0 lb

## 2019-06-25 DIAGNOSIS — F419 Anxiety disorder, unspecified: Secondary | ICD-10-CM

## 2019-06-25 DIAGNOSIS — N390 Urinary tract infection, site not specified: Secondary | ICD-10-CM | POA: Diagnosis not present

## 2019-06-25 DIAGNOSIS — M810 Age-related osteoporosis without current pathological fracture: Secondary | ICD-10-CM | POA: Diagnosis not present

## 2019-06-25 DIAGNOSIS — K580 Irritable bowel syndrome with diarrhea: Secondary | ICD-10-CM

## 2019-06-25 DIAGNOSIS — G8929 Other chronic pain: Secondary | ICD-10-CM

## 2019-06-25 DIAGNOSIS — M545 Low back pain, unspecified: Secondary | ICD-10-CM

## 2019-06-25 DIAGNOSIS — K22719 Barrett's esophagus with dysplasia, unspecified: Secondary | ICD-10-CM

## 2019-06-25 DIAGNOSIS — E785 Hyperlipidemia, unspecified: Secondary | ICD-10-CM

## 2019-06-25 MED ORDER — PREMARIN 0.625 MG/GM VA CREA: 1.0000 | TOPICAL_CREAM | VAGINAL | 12 refills | Status: DC

## 2019-06-25 NOTE — Progress Notes (Signed)
Location:  [place] of Service:  Clinic (12)  Provider:   Code Status:  Goals of Care:  Advanced Directives 04/23/2019  Does Patient Have a Medical Advance Directive? No  Would patient like information on creating a medical advance directive? No - Patient declined     Chief Complaint  Patient presents with  . Medical Management of Chronic Issues    Patient here to establish care.     HPI: Patient is a 79 y.o. female seen today for medical management of chronic diseases.   Patient came here today to [place] with our Practice Her Active problems are   ? Anxiety Attack vs TIA  Patient has been having episodes in which she start having Nausea, Palpitations, Dizziness ,SOB and Confusion. It can last from 5 min to 30 min Seen By Neurology Her Mri and EEG have been negative She has been seen by Cardiologist  Her Echo Normal. Zio Patch was negative for any Arrhythmias except Some PVC She says this started when she moved with her husband to friend's home.  They had to sell the house in [place].  There is some amount of anxiety involved neck Is taking three 81 mg Aspirin And was started on Statin but did not tolerate Recurrent UTI Patient has a history of recurrent UTI she says she has had 4-5 UTIs in the past few months.  She is on chronic nitrofurantoin.  Has appointment with the urologist Osteoporosis Is on Prolia Due for repeat Injection in May  IBS with diarrhea and questionable Barrett's Has appointment with [facility] for [facility] Hyperlipidemia Was started on Lipitor 40 mg per her Neurology. But patient started having tremor and does not want to take it anymore Back Pain Takes Neurontin PRN. Was told for surgery but does not want to do that  Has moved in [facility] in June 2020 from [place].  She has a son who lives in [place] and a niece who is the [place] Past Medical History:  Diagnosis Date  . Barrett's esophagus   . Colitis   . DDD  (degenerative disc disease)   . Erroneous encounter - disregard    error   . Erroneous encounter - disregard   . Erroneous encounter - disregard   . Factor 5 Leiden mutation, heterozygous [facility])    Per [place] Patient Packet.  . H/O echocardiogram 04/01/2019   Per [place] Patient Packet.  . Hiatal hernia    Per [place] Patient Packet.  . High grade dysplasia of Barrett's epithelium    Per [facility] New Patient Packet.  Marland Kitchen History of bladder infections    Per [facility] New Patient Packet.  Marland Kitchen History of bone density study 07/12/2018   Per [place] Patient Packet.  Marland Kitchen History of bone density study 07/12/2018   Up coming appointment 07/17/2019, Per [facility] New Patient Packet  . History of endoscopy 12/17/2015   Needed Every 3 years. Scheduled for January 2021. Per [facility] New Patient Packet.   Marland Kitchen History of mammogram 04/14/2019   Per [place] Patient Packet.  Marland Kitchen History of MRI 03/01/2019   By Dr.Gulati/ Neurologist. MRI of Brain. Per [facility] New Patient Packet.  Marland Kitchen History of MRI 04/23/2019   By Dr[facility] Maryan Rued at [facility]. Per [facility] New Patient Packet.  Marland Kitchen History of Papanicolaou smear of cervix 10/16/2011   Per [place] Patient Packet  . Hypotension    Per [facility] New Patient Packet.  . IBS (  irritable bowel syndrome)    Per [facility] New Patient Packet.  . Lactose intolerance   . Osteoarthritis    Per [facility] New Patient Packet.  Marland Kitchen Osteopenia    Per [place] New Patient Packet.  . Osteoporosis   . Raynaud's disease    Per [facility] New Patient Packet.  . Scoliosis    Per [place] New Patient Packet.    Past Surgical History:  Procedure Laterality Date  . ABDOMINAL HYSTERECTOMY  06/13/2011   By Dr[facility]Scherer at [facility]. Per [facility] New Patient Packet.  Marland Kitchen BREAST EXCISIONAL BIOPSY Left 30+ yrs ago  . COLONOSCOPY  11/23/2011   By Dr.McCune at [facility]. Per [place] Patient Packet.  Marland Kitchen SIGMOIDOSCOPY  11/02/2014   Per [place] New Patient Packet  . TONSILLECTOMY  06/12/1948   Per [place] New Patient Packet.     Allergies  Allergen Reactions  . Aspirin Other (See Comments)    Upsets ulcers  . Avelox [Moxifloxacin]   . Ciprofloxacin Hcl   . Levofloxacin   . Norfloxacin   . Nsaids Other (See Comments)    Upsets ulcers  . Ofloxacin   . Penicillins Hives  . Tape   . Tequin [Gatifloxacin]   . Latex Swelling and Rash  . Methylisothiazolinone Rash    Outpatient Encounter Medications as of 06/25/2019  Medication Sig  . aspirin EC 81 MG tablet Take 81 mg by mouth 3 (three) times daily. Take with food. ( enteric coated tablets, had peptic ulcers years ago. ) Per [facility] New Patient Packet.  . B Complex Vitamins (VITAMIN B-COMPLEX) TABS Take 1 tablet by mouth daily.  Marland Kitchen CALCIUM PO Take 1,500 mg by mouth daily.  Marland Kitchen dicyclomine (BENTYL) 10 MG/5ML solution Take 10 mg by mouth as needed. For stomach cramps.  . gabapentin (NEURONTIN) 100 MG capsule Take 100 mg by mouth daily as needed for pain.  . magnesium oxide (MAG-OX) 400 MG tablet Take 400 mg by mouth daily.  Marland Kitchen tiZANidine (ZANAFLEX) 2 MG tablet Take 2 mg by mouth as needed for muscle spasms. For leg cramps.  . valACYclovir (VALTREX) 500 MG tablet Take 500 mg by mouth daily.    No facility-administered encounter medications on file as of 06/25/2019.    Review of Systems:  Review of Systems  Constitutional: Negative.   HENT: Negative.   Respiratory: Negative.   Cardiovascular: Negative.   Gastrointestinal: Positive for diarrhea.  Genitourinary: Positive for frequency and urgency.  Musculoskeletal: Positive for back pain.  Neurological: Negative.   Psychiatric/Behavioral: The patient is nervous/anxious.   All other systems reviewed and are negative.   Health Maintenance  Topic Date Due  . DEXA SCAN  10/14/2007  . PNA vac Low Risk Adult (2 of 2 - PPSV23) 12/12/2015  . TETANUS/TDAP  04/08/2018  . INFLUENZA VACCINE  Completed    Physical Exam: Vitals:   06/25/19 1541  BP: 138/80  Temp: 98.5 F (36.9 C)  Weight: 138 lb (62.6 kg)  Height:  5\' 2"  (1.575 m)   Body mass index is 25.24 kg/m. Physical Exam Constitutional: Oriented to person, place, and time. Well-developed and well-nourished.  HENT:  Head: Normocephalic.  Mouth/Throat: Oropharynx is clear and moist. Had Some wax in her ear Eyes: Pupils are equal, round, and reactive to light.  Neck: Neck supple.  Cardiovascular: Normal rate and normal heart sounds.  No murmur heard. Pulmonary/Chest: Effort normal and breath sounds normal. No respiratory distress. No wheezes. She has no rales.  Abdominal: Soft. Bowel sounds are  normal. No distension. There is no tenderness. There is no rebound.  Musculoskeletal: No edema.  Lymphadenopathy: none Neurological: Alert and oriented to person, place, and time.  No Focal Deficits Gait is stable Does not use Cane. No Falls. No Tremors Skin: Skin is warm and dry.  Psychiatric: Normal mood and affect. Behavior is normal. Thought content normal.   Labs reviewed: Basic Metabolic Panel: Recent Labs    04/23/19 1250  NA 134*  K 4.2  CL 102  CO2 25  GLUCOSE 100*  BUN 9  CREATININE 0.86  CALCIUM 8.4*   Liver Function Tests: No results for input(s): AST, ALT, ALKPHOS, BILITOT, PROT, ALBUMIN in the last 8760 hours. No results for input(s): LIPASE, AMYLASE in the last 8760 hours. No results for input(s): AMMONIA in the last 8760 hours. CBC: Recent Labs    04/23/19 1250  WBC 9.0  NEUTROABS 5.3  HGB 12.2  HCT 38.5  MCV 101.0*  PLT 251   Lipid Panel: No results for input(s): CHOL, HDL, LDLCALC, TRIG, CHOLHDL, LDLDIRECT in the last 8760 hours. No results found for: HGBA1C  Procedures since last visit: No results found.  Assessment/Plan ? Episodes of Confusions/ Anxiety Work Up By cardiology and neurology has been negative so far She is on Aspirin Did not tolerate statin Does want to try something besides Lipitor We discussed the possibility of anxiety.  Trial of antidepressant or Klonopin Patient wants to wait for  now. Recurrent UTI On nitrofurantoin and Methenamine Has appointment with urology Also uses Premarin to help with vaginal atrophy Hyperlipidemia Had tremors with Lipitor.  Is open to try something else Osteoporosis Had DEXA in 01/20 in [place] Do not have access for that She was to schedule a Prolia injection in the office Back pain Uses Neurontin as needed Chronic diarrhea Has appointment with GI   Labs/tests ordered:  * No order type specified * Next appt:  Visit date not found  Total time spent in this patient care encounter was  45_  minutes; greater than 50% of the visit spent counseling patient and staff, reviewing records , Labs and coordinating care for problems addressed at this encounter.

## 2019-06-25 NOTE — Progress Notes (Signed)
New Patient Packet Abstracted by Sadee Osland/RMA.

## 2019-06-26 ENCOUNTER — Encounter: Payer: Self-pay | Admitting: Internal Medicine

## 2019-06-26 DIAGNOSIS — K2270 Barrett's esophagus without dysplasia: Secondary | ICD-10-CM | POA: Insufficient documentation

## 2019-06-26 DIAGNOSIS — E785 Hyperlipidemia, unspecified: Secondary | ICD-10-CM | POA: Insufficient documentation

## 2019-06-26 DIAGNOSIS — M810 Age-related osteoporosis without current pathological fracture: Secondary | ICD-10-CM | POA: Insufficient documentation

## 2019-06-26 DIAGNOSIS — N390 Urinary tract infection, site not specified: Secondary | ICD-10-CM | POA: Insufficient documentation

## 2019-06-26 DIAGNOSIS — M549 Dorsalgia, unspecified: Secondary | ICD-10-CM | POA: Insufficient documentation

## 2019-06-26 DIAGNOSIS — K589 Irritable bowel syndrome without diarrhea: Secondary | ICD-10-CM | POA: Insufficient documentation

## 2019-07-17 LAB — HM DEXA SCAN

## 2019-08-15 ENCOUNTER — Ambulatory Visit (INDEPENDENT_AMBULATORY_CARE_PROVIDER_SITE_OTHER): Payer: Medicare Other | Admitting: Family

## 2019-08-15 ENCOUNTER — Other Ambulatory Visit: Payer: Self-pay

## 2019-08-15 ENCOUNTER — Encounter: Payer: Self-pay | Admitting: Family

## 2019-08-15 VITALS — BP 128/70 | HR 74 | Temp 97.7°F | Wt 139.0 lb

## 2019-08-15 DIAGNOSIS — K1379 Other lesions of oral mucosa: Secondary | ICD-10-CM

## 2019-08-15 MED ORDER — MAGIC MOUTHWASH W/LIDOCAINE: 5.0000 mL | Freq: Three times a day (TID) | ORAL | 0 refills | Status: AC

## 2019-08-15 MED ORDER — VALACYCLOVIR HCL 1 G PO TABS: 500.0000 mg | ORAL_TABLET | Freq: Two times a day (BID) | ORAL | 0 refills | Status: AC

## 2019-08-15 NOTE — Progress Notes (Signed)
Provider: Reign Bartnick FNP-C  Virgie Dad, MD  Patient Care Team: Virgie Dad, MD as PCP - General (Internal Medicine) Rozetta Nunnery, MD as Consulting Physician (Otolaryngology) Luberta Mutter, MD as Consulting Physician (Ophthalmology)  Extended Emergency Contact Information Primary Emergency Contact: Engelbert,William M Address: [address] [place]          [place], [place] [address] Johnnette Litter of [place] Phone: [phone] Mobile Phone: [phone] Relation: Spouse Secondary Emergency Contact: Reeves,Pam    POA Address: [place], [place] [address] Johnnette Litter of [place] Phone: [phone] Relation: Niece Preferred language: English Interpreter needed? No  Code Status:  Full Code  Goals of care: Advanced Directive information Advanced Directives 04/23/2019  Does Patient Have a Medical Advance Directive? No  Would patient like information on creating a medical advance directive? No - Patient declined     Chief Complaint  Patient presents with  . Acute Visit    ulcers in mouth x 1 week     HPI:  Pt is a 79 y.o. female seen today for an acute visit for evaluation of ulcers in mouth x 10 days.she states felt the sores in the mouth after eating pop corn.she describes sores as painful especially when brushing her teeth.Also states tip of the  tongue is sensitive.she has noticed sores on right upper / lower gum.Last night new sore started under the tongue.Her tongue has been brown-black in color though thinks might be from mouth wash.she has had pain with swallowing so took a Benadryl last night. Also took a Valacyclovir 500 mg tablet that she had for previous genital  Herpes.she read about mouth sores online and went and bought some new toothpaste for Gum care and soft toothbrush. She denies any fever,chills or shortness of breath.    Past Medical History:  Diagnosis Date  . Barrett's esophagus   .  Colitis   . DDD (degenerative disc disease)   . Erroneous encounter - disregard    error   . Erroneous encounter - disregard   . Erroneous encounter - disregard   . Factor 5 Leiden mutation, heterozygous [facility])    Per [place] Patient Packet.  . H/O echocardiogram 04/01/2019   Per [place] Patient Packet.  . Hiatal hernia    Per [place] Patient Packet.  . High grade dysplasia of Barrett's epithelium    Per [facility] New Patient Packet.  Marland Kitchen History of bladder infections    Per [facility] New Patient Packet.  Marland Kitchen History of bone density study 07/12/2018   Per [place] Patient Packet.  Marland Kitchen History of bone density study 07/12/2018   Up coming appointment 07/17/2019, Per [facility] New Patient Packet  . History of endoscopy 12/17/2015   Needed Every 3 years. Scheduled for January 2021. Per [facility] New Patient Packet.   Marland Kitchen History of mammogram 04/14/2019   Per [place] Patient Packet.  Marland Kitchen History of MRI 03/01/2019   By Dr.Gulati/ Neurologist. MRI of Brain. Per [facility] New Patient Packet.  Marland Kitchen History of MRI 04/23/2019   By Dr. [facility]an Rued at [facility]. Per [facility] New Patient Packet.  Marland Kitchen History of Papanicolaou smear of cervix 10/16/2011   Per [place] Patient Packet  . Hypotension    Per [facility] New Patient Packet.  . IBS (irritable bowel syndrome)    Per [place] New Patient Packet.  . Lactose intolerance   . Osteoarthritis    Per  [place] New Patient Packet.  Marland Kitchen Osteopenia    Per [place] New Patient Packet.  . Osteoporosis   . Raynaud's disease    Per [facility] New Patient Packet.  . Scoliosis    Per [place] New Patient Packet.   Past Surgical History:  Procedure Laterality Date  . ABDOMINAL HYSTERECTOMY  06/13/2011   By Dr[facility]herer at [facility]. Per [facility] New Patient Packet.  Marland Kitchen BREAST EXCISIONAL BIOPSY Left 30+ yrs ago  . COLONOSCOPY  11/23/2011   By Dr.McCune at [facility]. Per [place] Patient Packet.  Marland Kitchen SIGMOIDOSCOPY  11/02/2014   Per [place] New Patient Packet  . TONSILLECTOMY  06/12/1946   Per [place] New Patient  Packet.    Allergies  Allergen Reactions  . Aspirin Other (See Comments)    Upsets ulcers  . Avelox [Moxifloxacin]   . Ciprofloxacin Hcl   . Levofloxacin   . Norfloxacin   . Nsaids Other (See Comments)    Upsets ulcers  . Ofloxacin   . Penicillins Hives  . Tape   . Tequin [Gatifloxacin]   . Latex Swelling and Rash  . Methylisothiazolinone Rash    Outpatient Encounter Medications as of 08/15/2019  Medication Sig  . aspirin EC 81 MG tablet Take 81 mg by mouth 3 (three) times daily.   . B Complex Vitamins (VITAMIN B-COMPLEX) TABS Take 1 tablet by mouth daily.  Marland Kitchen CALCIUM PO Take 1,500 mg by mouth daily.  . Cholecalciferol (D3-1000 PO) Take by mouth daily. 1,000 IU  . conjugated estrogens (PREMARIN) vaginal cream Place 1 Applicatorful vaginally 3 (three) times a week.  . denosumab (PROLIA) 60 MG/ML SOSY injection Inject 60 mg into the skin every 6 (six) months. Last injection 05/01/2019.  . gabapentin (NEURONTIN) 100 MG capsule Take 100 mg by mouth daily as needed for pain.  . Glucosamine HCl 1000 MG TABS Take 1 tablet by mouth 2 (two) times daily.  Marland Kitchen Lifitegrast (XIIDRA OP) Apply 1 drop to eye 2 (two) times daily.   Marland Kitchen loperamide (IMODIUM) 2 MG capsule Take 2 mg by mouth as needed for diarrhea or loose stools.  . magnesium oxide (MAG-OX) 400 MG tablet Take 400 mg by mouth daily.  . methenamine (HIPREX) 1 g tablet Take 1 g by mouth 2 (two) times daily with a meal. For UTI Prevention. Last UTI: October 20th, November 5th, and December 28th.   . Methenamine-Sodium Salicylate (CYSTEX PO) Take 100 mg by mouth daily. ( Cranberry Concentrate )  . Omeprazole-Sodium Bicarbonate (ZEGERID PO) Take 20 mg by mouth 2 (two) times daily.  . Polyvinyl Alcohol-Povidone (REFRESH OP) Apply 1 drop to eye as needed.  . valACYclovir (VALTREX) 500 MG tablet Take 500 mg by mouth daily.   . [DISCONTINUED] dicyclomine (BENTYL) 10 MG/5ML solution Take 10 mg by mouth as needed. For stomach cramps.  .  [DISCONTINUED] PSYLLIUM FIBER PO Take by mouth 5 (five) times daily.  . [DISCONTINUED] tiZANidine (ZANAFLEX) 2 MG tablet Take 2 mg by mouth as needed for muscle spasms. For leg cramps.   No facility-administered encounter medications on file as of 08/15/2019.    Review of Systems  Constitutional: Negative for appetite change, chills, fatigue and fever.  HENT: Positive for postnasal drip and rhinorrhea. Negative for congestion, sinus pressure, sinus pain, sneezing, sore throat and trouble swallowing.        Chronic runny nose. Mouth sore   Eyes: Positive for visual disturbance. Negative for discharge, redness and itching.       Wears  eye glasses   Respiratory: Negative for cough, chest tightness, shortness of breath and wheezing.   Cardiovascular: Negative for chest pain and leg swelling.       Chronic PVC's   Gastrointestinal: Positive for diarrhea. Negative for abdominal distention, abdominal pain, constipation, nausea and vomiting.       Chronic diarrhea d/t IBS   Skin: Negative for color change, pallor and rash.  Neurological: Negative for dizziness, speech difficulty, light-headedness, numbness and headaches.    Immunization History  Administered Date(s) Administered  . Influenza, High Dose Seasonal PF 03/26/2019  . Moderna SARS-COVID-2 Vaccination 06/16/2019  . Pneumococcal Conjugate-13 12/12/2014  . Tdap 04/08/2008   Pertinent  Health Maintenance Due  Topic Date Due  . DEXA SCAN  10/14/2007  . PNA vac Low Risk Adult (2 of 2 - PPSV23) 12/12/2015  . INFLUENZA VACCINE  Completed   Fall Risk  08/15/2019  Falls in the past year? 0  Number falls in past yr: 0  Injury with Fall? 0   Functional Status Survey:    Vitals:   08/15/19 1323  BP: 128/70  Pulse: 74  Temp: 97.7 F (36.5 C)  TempSrc: Oral  SpO2: 96%  Weight: 139 lb (63 kg)   Body mass index is 25.42 kg/m. Physical Exam Vitals reviewed.  Constitutional:      General: She is not in acute distress.     Appearance: She is not ill-appearing.  HENT:     Head: Normocephalic.     Right Ear: Tympanic membrane, ear canal and external ear normal. There is no impacted cerumen.     Left Ear: Tympanic membrane, ear canal and external ear normal. There is no impacted cerumen.     Nose: Nose normal. No congestion or rhinorrhea.     Mouth/Throat:     Mouth: Mucous membranes are moist. Oral lesions present.     Tongue: Lesions present.     Palate: No lesions.     Pharynx: Oropharynx is clear. No pharyngeal swelling, oropharyngeal exudate or posterior oropharyngeal erythema.     Tonsils: No tonsillar exudate or tonsillar abscesses.     Comments: Whitish ulcer on right upper gum with erythema along gum.red lesion also noted on right lower gum line and tip of tongue and under the tongue.painful to touch.   Eyes:     General: No scleral icterus.       Right eye: No discharge.        Left eye: No discharge.     Extraocular Movements: Extraocular movements intact.     Conjunctiva/sclera: Conjunctivae normal.     Pupils: Pupils are equal, round, and reactive to light.  Neck:     Vascular: No carotid bruit.  Cardiovascular:     Rate and Rhythm: Normal rate and regular rhythm.     Pulses: Normal pulses.     Heart sounds: Normal heart sounds. No murmur. No friction rub. No gallop.   Pulmonary:     Effort: Pulmonary effort is normal. No respiratory distress.     Breath sounds: Normal breath sounds. No wheezing, rhonchi or rales.  Chest:     Chest wall: No tenderness.  Abdominal:     General: Bowel sounds are normal. There is no distension.     Palpations: Abdomen is soft. There is no mass.     Tenderness: There is no abdominal tenderness. There is no right CVA tenderness, left CVA tenderness, guarding or rebound.  Musculoskeletal:        General:  No swelling or tenderness. Normal range of motion.     Cervical back: Normal range of motion. No rigidity or tenderness.     Right lower leg: No edema.      Left lower leg: No edema.  Lymphadenopathy:     Cervical: No cervical adenopathy.  Skin:    General: Skin is warm and dry.     Coloration: Skin is not pale.     Findings: No bruising, erythema or rash.  Neurological:     Mental Status: She is alert and oriented to person, place, and time.     Cranial Nerves: No cranial nerve deficit.     Sensory: No sensory deficit.     Motor: No weakness.     Gait: Gait normal.  Psychiatric:        Mood and Affect: Mood normal.        Behavior: Behavior normal.        Thought Content: Thought content normal.        Judgment: Judgment normal.     Labs reviewed: Recent Labs    04/23/19 1250  NA 134*  K 4.2  CL 102  CO2 25  GLUCOSE 100*  BUN 9  CREATININE 0.86  CALCIUM 8.4*   No results for input(s): AST, ALT, ALKPHOS, BILITOT, PROT, ALBUMIN in the last 8760 hours. Recent Labs    04/23/19 1250  WBC 9.0  NEUTROABS 5.3  HGB 12.2  HCT 38.5  MCV 101.0*  PLT 251   No results found for: TSH No results found for: HGBA1C No results found for: CHOL, HDL, LDLCALC, LDLDIRECT, TRIG, CHOLHDL  Significant Diagnostic Results in last 30 days:  No results found.  Assessment/Plan 1. Mouth sores Afebrile.Lesion worrisome for shingles verse canker sores.discussed patient's lesion with Dr.Reed present at [facility] office who also agrees possible shingles recommeded treating with valacyclovir and mouth wash with lidocaine.I've discussed treat with patient agrees to take Valacyclovir 1Gm twice daily x 7 days. - Advised to swish and spit magic mouth wash 5 ml three times daily x 7 days.Advised to avoid eating 30 minutes before or after using mouth wash.  - valACYclovir (VALTREX) 1000 MG tablet; Take 0.5 tablets (500 mg total) by mouth 2 (two) times daily for 7 days. Then resume 500 mg tablet daily.  Dispense: 7 tablet; Refill: 0 - magic mouthwash w/lidocaine SOLN; Take 5 mLs by mouth 3 (three) times daily for 7 days.  Dispense: 100 mL; Refill: 0 - Advised  to go to ED if she  develops any shortness of breath or choking sensation,or swelling of the tongue - follow up in 5 days with Dr[facility]Gupta at [facility]  Family/ staff Communication: Reviewed plan of care with patient verbalized understanding.  Labs/tests ordered: None   Next Appointment: 5 days with Dr.Gupta   Sandrea Hughs, NP

## 2019-08-15 NOTE — Patient Instructions (Signed)
-   Take Valacyclovir 1 Gm one by mouth twice daily x 7 days  - Swish mouth wash 5 ml by mouth three times daily x 7 days  - Please go to ED if you develop any shortness of breath or choking sensation,or swelling of the tongue

## 2019-08-20 ENCOUNTER — Other Ambulatory Visit: Payer: Self-pay

## 2019-08-20 ENCOUNTER — Encounter: Payer: Self-pay | Admitting: Internal Medicine

## 2019-08-20 ENCOUNTER — Non-Acute Institutional Stay: Payer: Medicare Other | Admitting: Internal Medicine

## 2019-08-20 VITALS — BP 136/88 | HR 84 | Temp 98.0°F | Ht 62.0 in | Wt 138.4 lb

## 2019-08-20 DIAGNOSIS — M810 Age-related osteoporosis without current pathological fracture: Secondary | ICD-10-CM

## 2019-08-20 DIAGNOSIS — E785 Hyperlipidemia, unspecified: Secondary | ICD-10-CM

## 2019-08-20 DIAGNOSIS — K1379 Other lesions of oral mucosa: Secondary | ICD-10-CM

## 2019-08-20 DIAGNOSIS — N390 Urinary tract infection, site not specified: Secondary | ICD-10-CM

## 2019-08-20 DIAGNOSIS — K22719 Barrett's esophagus with dysplasia, unspecified: Secondary | ICD-10-CM

## 2019-08-20 NOTE — Progress Notes (Signed)
Location: [place] of Service:  Clinic (12)  Provider:   Code Status:  Goals of Care:  Advanced Directives 04/23/2019  Does Patient Have a Medical Advance Directive? No  Would patient like information on creating a medical advance directive? No - Patient declined     Chief Complaint  Patient presents with  . Medical Management of Chronic Issues    Follow up on office visit at office due to herpes in mouth.     HPI: Patient is a 79 y.o. female seen today for an acute visit for Follow up of Mouth Ulcers most Likely due to Herpes Has h/o Recurrent UTI, Genital Herpes, Osteoporosis, IBS with Diarrhea, Barrett's Esophagus, Hyperlipidemia ans Back Pain  Mouth Ulcers Was seen in the Clinic on 3/05 for Mouth Ulcers.  Was diagnosed with a mouth herpes.  And started on Vancyclovir. States she feels much better now Patient does have a history of genital herpes and recurrent mouth Ulcers  She was on prophylactic Vancyclovir which she had stopped a year ago.  Other Issues Anxiety Attack / TIA Sya they are better. She still getting them but try to control with Breathing Exercise  Patient has these  episodes in which she start having Nausea, Palpitations, Dizziness ,SOB and Confusion. It can last from 5 min to 30 min Seen By Neurology Her MRI and EEG have been negative She has been seen by Cardiologist  Her Echo Normal. Zio Patch was negative for any Arrhythmias except Some PVC She says this started when she moved with her husband to friend's home.  They had to sell the house in [place].  There is some amount of anxiety involved neck Is taking three 81 mg Aspirin And was started on Statin but did not tolerate  Recurrent UTI Patient has a history of recurrent UTI she says she has had 4-5 UTIs in the past few months.  She is on chronic Methanimine Osteoporosis Is on Prolia Due for repeat Injection in May DEXA in previous PCP  IBS with diarrhea and questionable  Barrett's Has appointment with [facility] for [facility] Hyperlipidemia Was started on Lipitor 40 mg per her Neurology. But patient started having tremor and does not want to take it anymore Back Pain Takes Neurontin PRN. Was told for surgery but does not want to do that  Has moved in [facility] in June 2020 from [place].  She has a son who lives in [place] and a niece who is the [place]   Past Medical History:  Diagnosis Date  . Barrett's esophagus   . Colitis   . DDD (degenerative disc disease)   . Erroneous encounter - disregard    error   . Erroneous encounter - disregard   . Erroneous encounter - disregard   . Factor 5 Leiden mutation, heterozygous [facility])    Per [place] Patient Packet.  . H/O echocardiogram 04/01/2019   Per [place] Patient Packet.  . Hiatal hernia    Per [place] Patient Packet.  . High grade dysplasia of Barrett's epithelium    Per [facility] New Patient Packet.  Marland Kitchen History of bladder infections    Per [facility] New Patient Packet.  Marland Kitchen History of bone density study 07/12/2018   Per [place] Patient Packet.  Marland Kitchen History of bone density study 07/12/2018   Up coming appointment 07/17/2019, Per [facility] New Patient Packet  . History of endoscopy 12/17/2015   Needed Every 3 years. Scheduled for January 2021. Per  [place] New Patient Packet.   Marland Kitchen History of mammogram 04/14/2019   Per [place] Patient Packet.  Marland Kitchen History of MRI 03/01/2019   By Dr.Gulati/ Neurologist. MRI of Brain. Per [facility] New Patient Packet.  Marland Kitchen History of MRI 04/23/2019   By Dr. Maryan Rued at [facility]. Per [facility] New Patient Packet.  Marland Kitchen History of Papanicolaou smear of cervix 10/16/2011   Per [place] Patient Packet  . Hypotension    Per [facility] New Patient Packet.  . IBS (irritable bowel syndrome)    Per [place] New Patient Packet.  . Lactose intolerance   . Osteoarthritis    Per [facility] New Patient Packet.  Marland Kitchen Osteopenia    Per [place] New Patient Packet.  . Osteoporosis   . Raynaud's disease    Per [facility] New Patient  Packet.  . Scoliosis    Per [place] New Patient Packet.    Past Surgical History:  Procedure Laterality Date  . ABDOMINAL HYSTERECTOMY  06/13/2011   By Dr.Scherer at [facility]. Per [facility] New Patient Packet.  Marland Kitchen BREAST EXCISIONAL BIOPSY Left 30+ yrs ago  . COLONOSCOPY  11/23/2011   By Dr.McCune at [facility]. Per [place] Patient Packet.  Marland Kitchen SIGMOIDOSCOPY  11/02/2014   Per [place] New Patient Packet  . TONSILLECTOMY  06/12/1948   Per [place] New Patient Packet.    Allergies  Allergen Reactions  . Aspirin Other (See Comments)    Upsets ulcers  . Avelox [Moxifloxacin]   . Ciprofloxacin Hcl   . Levofloxacin   . Norfloxacin   . Nsaids Other (See Comments)    Upsets ulcers  . Ofloxacin   . Penicillins Hives  . Tape   . Tequin [Gatifloxacin]   . Latex Swelling and Rash  . Methylisothiazolinone Rash    Outpatient Encounter Medications as of 08/20/2019  Medication Sig  . aspirin EC 81 MG tablet Take 81 mg by mouth 3 (three) times daily.   . B Complex Vitamins (VITAMIN B-COMPLEX) TABS Take 1 tablet by mouth daily.  Marland Kitchen CALCIUM PO Take 1,500 mg by mouth daily.  . Cholecalciferol (D3-1000 PO) Take by mouth daily. 2,000 IU  . conjugated estrogens (PREMARIN) vaginal cream Place 1 Applicatorful vaginally 3 (three) times a week.  . denosumab (PROLIA) 60 MG/ML SOSY injection Inject 60 mg into the skin every 6 (six) months. Last injection 05/01/2019.  . gabapentin (NEURONTIN) 100 MG capsule Take 100 mg by mouth daily as needed for pain.  . Glucosamine HCl 1000 MG TABS Take 1 tablet by mouth 2 (two) times daily.  Marland Kitchen Lifitegrast (XIIDRA OP) Apply 1 drop to eye 2 (two) times daily.   Marland Kitchen loperamide (IMODIUM) 2 MG capsule Take 2 mg by mouth as needed for diarrhea or loose stools.  . magic mouthwash w/lidocaine SOLN Take 5 mLs by mouth 3 (three) times daily for 7 days.  . magnesium oxide (MAG-OX) 400 MG tablet Take 400 mg by mouth daily.  . methenamine (HIPREX) 1 g tablet Take 1 g by  mouth 2 (two) times daily with a meal. For UTI Prevention. Last UTI: October 20th, November 5th, and December 28th.   . Methenamine-Sodium Salicylate (CYSTEX PO) Take 100 mg by mouth daily. ( Cranberry Concentrate )  . Omeprazole-Sodium Bicarbonate (ZEGERID PO) Take 20 mg by mouth 2 (two) times daily.  . Polyvinyl Alcohol-Povidone (REFRESH OP) Apply 1 drop to eye as needed.  . valACYclovir (VALTREX) 1000 MG tablet Take 0.5 tablets (500 mg total) by mouth 2 (two)  times daily for 7 days. Then resume 500 mg tablet daily.   No facility-administered encounter medications on file as of 08/20/2019.    Review of Systems:  Review of Systems  Review of Systems  Constitutional: Negative for activity change, appetite change, chills, diaphoresis, fatigue and fever.  HENT: Negative for mouth sores, postnasal drip, rhinorrhea, sinus pain and sore throat.   Respiratory: Negative for apnea, cough, chest tightness, shortness of breath and wheezing.   Cardiovascular: Negative for chest pain, palpitations and leg swelling.  Gastrointestinal: Negative for abdominal distention, abdominal pain, constipation, Does have Diarrhea   Genitourinary: Negative for dysuria and frequency.  Musculoskeletal: Negative for arthralgias, joint swelling and myalgias.  Skin: Negative for rash.  Neurological: Negative for dizziness, syncope, weakness, light-headedness and numbness.  Psychiatric/Behavioral: Negative for behavioral problems, confusion and sleep disturbance.     Health Maintenance  Topic Date Due  . DEXA SCAN  10/14/2007  . PNA vac Low Risk Adult (2 of 2 - PPSV23) 12/12/2015  . TETANUS/TDAP  04/08/2018  . INFLUENZA VACCINE  Completed    Physical Exam: Vitals:   08/20/19 1253  BP: 136/88  Pulse: 84  Temp: 98 F (36.7 C)  SpO2: 97%  Weight: 138 lb 6.4 oz (62.8 kg)  Height: 5\' 2"  (1.575 m)   Body mass index is 25.31 kg/m. Physical Exam  Constitutional: Oriented to person, place, and time.  Well-developed and well-nourished.  HENT:  Head: Normocephalic.  Mouth/Throat: Oropharynx is clear and moist.  Was clear Did not see any Ulcers Eyes: Pupils are equal, round, and reactive to light.  Neck: Neck supple.  Cardiovascular: Normal rate and normal heart sounds.  No murmur heard. Pulmonary/Chest: Effort normal and breath sounds normal. No respiratory distress. No wheezes. She has no rales.  Abdominal: Soft. Bowel sounds are normal. No distension. There is no tenderness. There is no rebound.  Musculoskeletal: No edema.  Lymphadenopathy: none Neurological: Alert and oriented to person, place, and time.  Skin: Skin is warm and dry.  Psychiatric: Normal mood and affect. Behavior is normal. Thought content normal.    Labs reviewed: Basic Metabolic Panel: Recent Labs    04/23/19 1250  NA 134*  K 4.2  CL 102  CO2 25  GLUCOSE 100*  BUN 9  CREATININE 0.86  CALCIUM 8.4*   Liver Function Tests: No results for input(s): AST, ALT, ALKPHOS, BILITOT, PROT, ALBUMIN in the last 8760 hours. No results for input(s): LIPASE, AMYLASE in the last 8760 hours. No results for input(s): AMMONIA in the last 8760 hours. CBC: Recent Labs    04/23/19 1250  WBC 9.0  NEUTROABS 5.3  HGB 12.2  HCT 38.5  MCV 101.0*  PLT 251   Lipid Panel: No results for input(s): CHOL, HDL, LDLCALC, TRIG, CHOLHDL, LDLDIRECT in the last 8760 hours. No results found for: HGBA1C  Procedures since last visit: No results found.  Assessment/Plan  Mouth Ulcers ? Herpes Better on Vancyclovir She was on Suppressive Therapy for Gential Herpes before. Will restart that again.  ? Episodes of Confusions/ Anxiety Work Up By cardiology and neurology has been negative so far She is on Aspirin Did not tolerate statin Will Consider Antianxiety next visit. Right Now she feels she can handle it   Recurrent UTI On  Methenamine Follows  with urology Also uses Premarin to help with vaginal  atrophy Hyperlipidemia Had tremors with Lipitor.  Is open to try something else Repeat Lipid Panel Osteoporosis Repeat BMD Tscore -2.2 Continue Prolia  Back pain  Uses Neurontin as needed Chronic diarrhea Is Having EGD and Colonoscopy Next week with [facility]   Labs/tests ordered:  * No order type specified * Next appt:  09/03/2019

## 2019-08-26 HISTORY — PX: COLONOSCOPY: SHX174

## 2019-09-01 ENCOUNTER — Other Ambulatory Visit: Payer: Self-pay

## 2019-09-01 DIAGNOSIS — M810 Age-related osteoporosis without current pathological fracture: Secondary | ICD-10-CM

## 2019-09-01 DIAGNOSIS — E785 Hyperlipidemia, unspecified: Secondary | ICD-10-CM

## 2019-09-01 LAB — LIPID PANEL
Cholesterol: 228 mg/dL — ABNORMAL HIGH (ref <200)
HDL: 93 mg/dL (ref >=50)
LDL Cholesterol (Calc): 112 mg/dL (calc) — ABNORMAL HIGH
Non-HDL Cholesterol (Calc): 135 mg/dL (calc) — ABNORMAL HIGH (ref <130)
Total CHOL/HDL Ratio: 2.5 (calc) (ref <5.0)
Triglycerides: 115 mg/dL (ref <150)

## 2019-09-01 LAB — CBC WITH DIFFERENTIAL/PLATELET
Absolute Monocytes: 636 cells/uL (ref 200–950)
Basophils Absolute: 101 cells/uL (ref 0–200)
Basophils Relative: 1.6 %
Eosinophils Absolute: 164 cells/uL (ref 15–500)
Eosinophils Relative: 2.6 %
HCT: 41.8 % (ref 35.0–45.0)
Hemoglobin: 13.8 g/dL (ref 11.7–15.5)
Lymphs Abs: 1915 cells/uL (ref 850–3900)
MCH: 31.4 pg (ref 27.0–33.0)
MCHC: 33 g/dL (ref 32.0–36.0)
MCV: 95.2 fL (ref 80.0–100.0)
MPV: 10.1 fL (ref 7.5–12.5)
Monocytes Relative: 10.1 %
Neutro Abs: 3484 cells/uL (ref 1500–7800)
Neutrophils Relative %: 55.3 %
Platelets: 374 10*3/uL (ref 140–400)
RBC: 4.39 10*6/uL (ref 3.80–5.10)
RDW: 12.4 % (ref 11.0–15.0)
Total Lymphocyte: 30.4 %
WBC: 6.3 10*3/uL (ref 3.8–10.8)

## 2019-09-01 LAB — COMPLETE METABOLIC PANEL WITH GFR
AG Ratio: 1.8 (calc) (ref 1.0–2.5)
ALT: 17 U/L (ref 6–29)
AST: 22 U/L (ref 10–35)
Albumin: 4.4 g/dL (ref 3.6–5.1)
Alkaline phosphatase (APISO): 81 U/L (ref 37–153)
BUN: 10 mg/dL (ref 7–25)
CO2: 27 mmol/L (ref 20–32)
Calcium: 9.6 mg/dL (ref 8.6–10.4)
Chloride: 104 mmol/L (ref 98–110)
Creat: 0.76 mg/dL (ref 0.60–0.93)
GFR, Est African American: 88 mL/min/{1.73_m2} (ref >=60)
GFR, Est Non African American: 76 mL/min/{1.73_m2} (ref >=60)
Globulin: 2.4 g/dL (calc) (ref 1.9–3.7)
Glucose, Bld: 102 mg/dL — ABNORMAL HIGH (ref 65–99)
Potassium: 4.2 mmol/L (ref 3.5–5.3)
Sodium: 141 mmol/L (ref 135–146)
Total Bilirubin: 0.5 mg/dL (ref 0.2–1.2)
Total Protein: 6.8 g/dL (ref 6.1–8.1)

## 2019-09-01 LAB — VITAMIN D 25 HYDROXY (VIT D DEFICIENCY, FRACTURES): Vit D, 25-Hydroxy: 51 ng/mL (ref 30–100)

## 2019-09-02 ENCOUNTER — Other Ambulatory Visit: Payer: Self-pay

## 2019-09-02 ENCOUNTER — Encounter (HOSPITAL_COMMUNITY): Payer: Self-pay

## 2019-09-02 ENCOUNTER — Emergency Department (HOSPITAL_COMMUNITY)
Admission: EM | Admit: 2019-09-02 | Discharge: 2019-09-02 | Disposition: A | Discharge status: Home / Self Care | Payer: Medicare Other | Attending: Emergency Medicine | Admitting: Emergency Medicine

## 2019-09-02 ENCOUNTER — Emergency Department (HOSPITAL_COMMUNITY): Payer: Medicare Other

## 2019-09-02 DIAGNOSIS — Z79899 Other long term (current) drug therapy: Secondary | ICD-10-CM | POA: Diagnosis not present

## 2019-09-02 DIAGNOSIS — R519 Headache, unspecified: Secondary | ICD-10-CM | POA: Diagnosis not present

## 2019-09-02 DIAGNOSIS — G500 Trigeminal neuralgia: Secondary | ICD-10-CM

## 2019-09-02 DIAGNOSIS — Z7982 Long term (current) use of aspirin: Secondary | ICD-10-CM | POA: Diagnosis not present

## 2019-09-02 DIAGNOSIS — Z87891 Personal history of nicotine dependence: Secondary | ICD-10-CM | POA: Insufficient documentation

## 2019-09-02 DIAGNOSIS — Z91040 Latex allergy status: Secondary | ICD-10-CM | POA: Insufficient documentation

## 2019-09-02 LAB — CBC WITH DIFFERENTIAL/PLATELET
Abs Immature Granulocytes: 0.03 10*3/uL (ref 0.00–0.07)
Basophils Absolute: 0.1 10*3/uL (ref 0.0–0.1)
Basophils Relative: 1 %
Eosinophils Absolute: 0.1 10*3/uL (ref 0.0–0.5)
Eosinophils Relative: 2 %
HCT: 42.1 % (ref 36.0–46.0)
Hemoglobin: 13.5 g/dL (ref 12.0–15.0)
Immature Granulocytes: 0 %
Lymphocytes Relative: 24 %
Lymphs Abs: 1.7 10*3/uL (ref 0.7–4.0)
MCH: 32 pg (ref 26.0–34.0)
MCHC: 32.1 g/dL (ref 30.0–36.0)
MCV: 99.8 fL (ref 80.0–100.0)
Monocytes Absolute: 0.7 10*3/uL (ref 0.1–1.0)
Monocytes Relative: 10 %
Neutro Abs: 4.4 10*3/uL (ref 1.7–7.7)
Neutrophils Relative %: 63 %
Platelets: 307 10*3/uL (ref 150–400)
RBC: 4.22 MIL/uL (ref 3.87–5.11)
RDW: 13.4 % (ref 11.5–15.5)
WBC: 7 10*3/uL (ref 4.0–10.5)
nRBC: 0 % (ref 0.0–0.2)

## 2019-09-02 LAB — BASIC METABOLIC PANEL
Anion gap: 9 (ref 5–15)
BUN: 12 mg/dL (ref 8–23)
CO2: 27 mmol/L (ref 22–32)
Calcium: 8.8 mg/dL — ABNORMAL LOW (ref 8.9–10.3)
Chloride: 100 mmol/L (ref 98–111)
Creatinine, Ser: 0.74 mg/dL (ref 0.44–1.00)
GFR calc Af Amer: >60 mL/min (ref >60)
GFR calc non Af Amer: >60 mL/min (ref >60)
Glucose, Bld: 100 mg/dL — ABNORMAL HIGH (ref 70–99)
Potassium: 4.2 mmol/L (ref 3.5–5.1)
Sodium: 136 mmol/L (ref 135–145)

## 2019-09-02 MED ORDER — GADOBUTROL 1 MMOL/ML IV SOLN
6.0000 mL | Freq: Once | INTRAVENOUS | Status: AC | PRN
  Administered 2019-09-02: 6 mL via INTRAVENOUS

## 2019-09-02 MED ORDER — GABAPENTIN 100 MG PO CAPS
100.0000 mg | ORAL_CAPSULE | Freq: Once | ORAL | Status: AC
  Administered 2019-09-02: 100 mg via ORAL
  Filled 2019-09-02: qty 1

## 2019-09-02 MED ORDER — GABAPENTIN 300 MG PO CAPS: 300.0000 mg | ORAL_CAPSULE | Freq: Three times a day (TID) | ORAL | 0 refills | Status: DC

## 2019-09-02 NOTE — ED Provider Notes (Signed)
[place] DEPT Provider Note   CSN: [id number] Arrival date & time: 09/02/19  [id number]     History Chief Complaint  Patient presents with  . Headache    GENEVIA VOLPE is a 79 y.o. female.  The history is provided by the patient, medical records and the spouse. No language interpreter was used.  Headache Pain location:  R temporal Quality:  Sharp Radiates to:  Does not radiate Severity currently:  0/10 Severity at highest:  7/10 Onset quality:  Sudden Duration:  2 days Timing:  Sporadic Progression:  Waxing and waning Chronicity:  New Similar to prior headaches: no   Context: not activity, not exposure to bright light, not coughing and not straining   Relieved by:  Nothing Worsened by:  Nothing Ineffective treatments:  None tried Associated symptoms: no abdominal pain, no back pain, no congestion, no cough, no diarrhea, no dizziness, no ear pain, no eye pain, no fatigue, no fever, no focal weakness, no hearing loss, no loss of balance, no myalgias, no nausea, no near-syncope, no neck pain, no neck stiffness, no numbness, no paresthesias, no photophobia, no seizures, no sinus pressure, no sore throat, no tingling, no visual change, no vomiting and no weakness        Past Medical History:  Diagnosis Date  . Barrett's esophagus   . Colitis   . DDD (degenerative disc disease)   . Erroneous encounter - disregard    error   . Erroneous encounter - disregard   . Erroneous encounter - disregard   . Factor 5 Leiden mutation, heterozygous [facility])    Per [place] Patient Packet.  . H/O echocardiogram 04/01/2019   Per [place] Patient Packet.  . Hiatal hernia    Per [place] Patient Packet.  . High grade dysplasia of Barrett's epithelium    Per [facility] New Patient Packet.  Marland Kitchen History of bladder infections    Per [facility] New Patient Packet.  Marland Kitchen History of bone density study 07/12/2018   Per [place] Patient Packet.  Marland Kitchen History of bone density study 07/12/2018   Up coming appointment 07/17/2019, Per [facility] New Patient Packet  . History of endoscopy 12/17/2015   Needed Every 3 years. Scheduled for January 2021. Per [facility] New Patient Packet.   Marland Kitchen History of mammogram 04/14/2019   Per [place] Patient Packet.  Marland Kitchen History of MRI 03/01/2019   By Dr.Gulati/ Neurologist. MRI of Brain. Per [facility] New Patient Packet.  Marland Kitchen History of MRI 04/23/2019   By Dr. Maryan Rued at [facility]. Per [facility] New Patient Packet.  Marland Kitchen History of Papanicolaou smear of cervix 10/16/2011   Per [place] Patient Packet  . Hypotension    Per [facility] New Patient Packet.  . IBS (irritable bowel syndrome)    Per [place] New Patient Packet.  . Lactose intolerance   . Osteoarthritis    Per [facility] New Patient Packet.  Marland Kitchen Osteopenia    Per [place] New Patient Packet.  . Osteoporosis   . Raynaud's disease    Per [facility] New Patient Packet.  . Scoliosis    Per [place] New Patient Packet.    Patient Active Problem List   Diagnosis Date Noted  . Recurrent UTI 06/26/2019  . Osteoporosis 06/26/2019  . IBS (irritable bowel syndrome) 06/26/2019  . Hyperlipidemia 06/26/2019  . Back pain 06/26/2019  . Barrett's esophagus 06/26/2019    Past Surgical History:  Procedure Laterality Date  . ABDOMINAL HYSTERECTOMY  06/13/2011   By  Dr[facility]Scherer at [facility]. Per [facility] New Patient Packet.  Marland Kitchen BREAST EXCISIONAL BIOPSY Left 30+ yrs ago  . COLONOSCOPY  11/23/2011   By Dr.McCune at [facility]. Per [place] Patient Packet.  Marland Kitchen SIGMOIDOSCOPY  11/02/2014   Per [place] New Patient Packet  . TONSILLECTOMY  06/12/1948   Per [place] New Patient Packet.     OB History   No obstetric history on file.     Family History  Problem Relation Age of Onset  . Cancer Mother   . Cancer Father     Social History   Tobacco Use  . Smoking status: Former Smoker    Start date: 06/12/1977  . Smokeless tobacco: Never Used  . Tobacco comment: 42 years ago. Smoked from age [age]  Substance Use Topics  . Alcohol use: Yes     Alcohol/week: 1.0 standard drinks    Types: 1 Glasses of wine per week    Comment: Daily   . Drug use: No    Home Medications Prior to Admission medications   Medication Sig Start Date End Date Taking? Authorizing Provider  aspirin EC 81 MG tablet Take 81 mg by mouth 3 (three) times daily.     [provider]  B Complex Vitamins (VITAMIN B-COMPLEX) TABS Take 1 tablet by mouth daily.    [provider]  Biotin 5000 MCG TABS Take 1 tablet by mouth daily.    [provider]  CALCIUM PO Take 1,500 mg by mouth daily.    [provider]  Cholecalciferol (D3-1000 PO) Take by mouth daily. 2,000 IU    [provider]  conjugated estrogens (PREMARIN) vaginal cream Place 1 Applicatorful vaginally 3 (three) times a week. 06/25/19   Virgie Dad, MD  denosumab (PROLIA) 60 MG/ML SOSY injection Inject 60 mg into the skin every 6 (six) months. Last injection 05/01/2019.    [provider]  gabapentin (NEURONTIN) 100 MG capsule Take 100 mg by mouth daily as needed for pain.    [provider]  Glucosamine HCl 1000 MG TABS Take 1 tablet by mouth 2 (two) times daily.    [provider]  Lifitegrast Shirley Friar OP) Apply 1 drop to eye 2 (two) times daily.     [provider]  loperamide (IMODIUM) 2 MG capsule Take 2 mg by mouth as needed for diarrhea or loose stools.    [provider]  magnesium oxide (MAG-OX) 400 MG tablet Take 400 mg by mouth daily.    [provider]  methenamine (HIPREX) 1 g tablet Take 1 g by mouth 2 (two) times daily with a meal. For UTI Prevention. Last UTI: October 20th, November 5th, and December 28th.     [provider]  Methenamine-Sodium Salicylate (CYSTEX PO) Take 100 mg by mouth daily. ( Cranberry Concentrate )    [provider]  Multiple Vitamins-Minerals (ONE-A-DAY WOMENS PO) Take 1 tablet by mouth daily.    [provider]  Multiple Vitamins-Minerals  (PRESERVISION AREDS 2 PO) Take 1 tablet by mouth daily.    [provider]  NON FORMULARY CBD Full spectrum 1 ml as needed for sleep.    [provider]  Omeprazole-Sodium Bicarbonate (ZEGERID PO) Take 20 mg by mouth 2 (two) times daily.    [provider]  Polyvinyl Alcohol-Povidone (REFRESH OP) Apply 1 drop to eye as needed.    [provider]  Probiotic Product (PROBIOTIC COLON SUPPORT PO) Take 1 tablet by mouth daily.  [provider]  Turmeric (QC TUMERIC COMPLEX) 500 MG CAPS Take 1 capsule by mouth daily.    [provider]    Allergies    Aspirin, Avelox [moxifloxacin], Ciprofloxacin hcl, Levofloxacin, Norfloxacin, Nsaids, Ofloxacin, Penicillins, Tape, Tequin [gatifloxacin], Latex, and Methylisothiazolinone  Review of Systems   Review of Systems  Constitutional: Negative for chills, fatigue and fever.  HENT: Negative for congestion, ear discharge, ear pain, facial swelling, hearing loss, rhinorrhea, sinus pressure, sinus pain, sneezing, sore throat, tinnitus and trouble swallowing.   Eyes: Negative for photophobia, pain, redness, itching and visual disturbance.  Respiratory: Negative for cough, chest tightness, shortness of breath, wheezing and stridor.   Cardiovascular: Negative for chest pain and near-syncope.  Gastrointestinal: Negative for abdominal pain, constipation, diarrhea, nausea and vomiting.  Genitourinary: Negative for dysuria, flank pain and frequency.  Musculoskeletal: Negative for back pain, myalgias, neck pain and neck stiffness.  Skin: Negative for rash and wound.  Neurological: Positive for headaches. Negative for dizziness, focal weakness, seizures, syncope, facial asymmetry, speech difficulty, weakness, light-headedness, numbness, paresthesias and loss of balance.  Psychiatric/Behavioral: Negative for agitation.  All other systems reviewed and are negative.   Physical Exam Updated Vital Signs BP 128/71  (BP Location: Right Arm)   Pulse 86   Temp 97.9 F (36.6 C) (Oral)   Resp 16   Ht 5' 1.5" (1.562 m)   Wt 60.5 kg   SpO2 100%   BMI 24.80 kg/m   Physical Exam Vitals and nursing note reviewed.  Constitutional:      General: She is not in acute distress.    Appearance: Normal appearance. She is well-developed. She is not ill-appearing, toxic-appearing or diaphoretic.  HENT:     Head: Normocephalic and atraumatic.     Right Ear: Tympanic membrane, ear canal and external ear normal. There is no impacted cerumen.     Left Ear: External ear normal.     Nose: Nose normal. No congestion or rhinorrhea.     Mouth/Throat:     Mouth: Mucous membranes are moist.     Pharynx: Oropharynx is clear. No oropharyngeal exudate.  Eyes:     General: No scleral icterus.    Extraocular Movements: Extraocular movements intact.     Right eye: Normal extraocular motion.     Left eye: Normal extraocular motion.     Conjunctiva/sclera: Conjunctivae normal.     Pupils: Pupils are equal, round, and reactive to light.     Right eye: Pupil is round and reactive.     Left eye: Pupil is round and reactive.  Neck:     Vascular: No carotid bruit.  Cardiovascular:     Rate and Rhythm: Normal rate.     Pulses: Normal pulses.     Heart sounds: No murmur.  Pulmonary:     Effort: Pulmonary effort is normal. No respiratory distress.     Breath sounds: No stridor. No wheezing, rhonchi or rales.  Chest:     Chest wall: No tenderness.  Abdominal:     General: Abdomen is flat. There is no distension.     Tenderness: There is no abdominal tenderness. There is no right CVA tenderness, left CVA tenderness or rebound.  Musculoskeletal:        General: No tenderness.     Cervical back: Normal range of motion and neck supple. No rigidity or tenderness.  Skin:    General: Skin is warm.     Capillary Refill: Capillary refill takes less than 2 seconds.  Coloration: Skin is not pale.     Findings: No erythema or  rash.  Neurological:     General: No focal deficit present.     Mental Status: She is alert and oriented to person, place, and time.     Cranial Nerves: No cranial nerve deficit.     Sensory: No sensory deficit.     Motor: No weakness or abnormal muscle tone.     Coordination: Coordination normal.     Gait: Gait normal.     Deep Tendon Reflexes: Reflexes are normal and symmetric.  Psychiatric:        Mood and Affect: Mood normal.     ED Results / Procedures / Treatments   Labs (all labs ordered are listed, but only abnormal results are displayed) Labs Reviewed  BASIC METABOLIC PANEL - Abnormal; Notable for the following components:      Result Value   Glucose, Bld 100 (*)    Calcium 8.8 (*)    All other components within normal limits  CBC WITH DIFFERENTIAL/PLATELET    EKG None  Radiology MR BRAIN W WO CONTRAST  Result Date: 09/02/2019 CLINICAL DATA:  Right-sided headache. EXAM: MRI HEAD WITHOUT AND WITH CONTRAST TECHNIQUE: Multiplanar, multiecho pulse sequences of the brain and surrounding structures were obtained without and with intravenous contrast. CONTRAST:  36mL GADAVIST GADOBUTROL 1 MMOL/ML IV SOLN COMPARISON:  MRI head 03/01/2019 FINDINGS: Brain: Ventricle size and cerebral volume normal for age. Negative for acute infarct. Few small white matter hyperintensities bilaterally stable from the prior MRI. Negative for hemorrhage or mass. Normal enhancement following contrast administration. Vascular: Normal arterial flow voids Skull and upper cervical spine: No focal skeletal lesion. Sinuses/Orbits: Negative Other: None IMPRESSION: No acute abnormality. Mild white matter changes stable since the prior MRI. Electronically Signed   By: Franchot Gallo M.D.   On: 09/02/2019 12:41   MR FACE/TRIGEMINAL WO/W CM  Result Date: 09/02/2019 CLINICAL DATA:  Right-sided headache. Rule out trigeminal neuralgia. EXAM: MRI FACE TRIGEMINAL WITHOUT AND WITH CONTRAST TECHNIQUE: Multiplanar,  multiecho pulse sequences of the face and surrounding structures, including thin slice imaging of the course of the Trigeminal Nerves, were obtained both before and after administration of intravenous contrast. CONTRAST:  26mL GADAVIST GADOBUTROL 1 MMOL/ML IV SOLN COMPARISON:  MRI head 09/02/2019 FINDINGS: High-resolution imaging was obtained through the trigeminal nerve. Normal brainstem. The cisternal segment of the trigeminal nerve is normal bilaterally. No enlargement or mass lesion. No vascular impingement on the nerve. The trigeminal ganglion and cavernous sinus appear normal bilaterally. No mass lesion. Normal skull base. Negative orbit and sinuses bilaterally. Brain findings reported separately. IMPRESSION: Normal. Trigeminal nerve and cavernous sinus. No tumor or vascular impingement on the trigeminal nerve. Electronically Signed   By: Franchot Gallo M.D.   On: 09/02/2019 12:05    Procedures Procedures (including critical care time)  Medications Ordered in ED Medications  gadobutrol (GADAVIST) 1 MMOL/ML injection 6 mL (6 mLs Intravenous Contrast Given 09/02/19 1142)  gabapentin (NEURONTIN) capsule 100 mg (100 mg Oral Given 09/02/19 1319)    ED Course  I have reviewed the triage vital signs and the nursing notes.  Pertinent labs & imaging results that were available during my care of the patient were reviewed by me and considered in my medical decision making (see chart for details).    MDM Rules/Calculators/A&P                      MAKYAH SHELMAN is a  79 y.o. female with a past medical history significant for irritable bowel syndrome, hyperlipidemia, Barrett's esophagus, osteoporosis, factor V Leiden, and recent shingles/herpes in the mouth who presents with right-sided headache.  Patient reports that 2 weeks ago she had ulcers in her mouth that was found to be likely either shingles or recurrent herpes.  Patient has been on valacyclovir over the years but stopped it and was restarted on it  with these ulcers.  She reports ulcers have resolved but yesterday started having right-sided headache.  She reports it is just above her ear and is shooting and sharp in quality.  It lasts for a second but is extremely severe.  It then goes away for several months before coming back.  She denies any nausea, vomiting, visual changes.  She denies any neck pain or neck stiffness that is new.  She denies any focal neurologic deficits related to the symptoms.  She denies any abdominal pain, chest pain, palpitations.  No shortness of breath or fevers or chills.  No Covid symptoms.  She denies any other rashes on her right face or the rest of her body.  She reports that she was able to sleep last night and did not have symptoms and she did take some gabapentin yesterday which she is unsure if helped or not.  She does report history of migraines when she was younger but denies this being similar.  She also reports is not feel similar to sinus headache she has had in the past.  No photophobia or phonophobia.  No recent trauma or injuries.  On exam, she did not have tenderness in the portal area, I could not provoke her pains.  Several times during the exam she had these shooting fleeting pains.  No focal neurologic deficits my exam.  Normal gait.  Pupils are symmetric and reactive with normal extraocular movements.  No rash on the face and ear exam showed no rash in the ear.  No mastoid tenderness.  No neck tenderness.  No bruit.  Exam otherwise unremarkable.  Based on patient's description of symptoms I am concerned about trigeminal neuralgia as the cause of her discomfort.  Lower suspicion for stroke, mass, giant cell arteritis, or other etiology this time.  Spoke briefly with neurology who recommended MRI IAC with contrast to rule out other concerning etiologies.  If the MRI is reassuring, neurology recommended increasing her gabapentin to 100 3 times daily followed by likely increasing to 300 3 times daily and  follow-up with outpatient neurology.  He would recommend that she is already on gabapentin and to increase this as opposed to adding Tegretol at this time.  Anticipate reassessment after MRI.  1:15 PM Labs and imaging is reassuring.  I suspect trigeminal neuralgia as cause of symptoms.  Patient will be given a dose of gabapentin here and will be instructed to increase her gabapentin up to 100 three times a day and escalate to 300 three times a day in a few days.  She also follow-up with outpatient neurology.  Will place ambulatory referral and give her the instructions.   Final Clinical Impression(s) / ED Diagnoses Final diagnoses:  Acute nonintractable headache, unspecified headache type  Trigeminal neuralgia of right side of face    Rx / DC Orders ED Discharge Orders         Ordered    gabapentin (NEURONTIN) 300 MG capsule  3 times daily     09/02/19 1318    Ambulatory referral to Neurology  Comments: An appointment is requested in approximately: 1 week  Patient has likely trigeminal neuralgia.  Spoke with Dr. Malen Gauze in the emergency department who recommended close outpatient neurology follow-up.   09/02/19 1318          Clinical Impression: 1. Acute nonintractable headache, unspecified headache type   2. Trigeminal neuralgia of right side of face     Disposition: Discharge  Condition: Good  I have discussed the results, Dx and Tx plan with the pt(& family if present). He/she/they expressed understanding and agree(s) with the plan. Discharge instructions discussed at great length. Strict return precautions discussed and pt &/or family have verbalized understanding of the instructions. No further questions at time of discharge.    New Prescriptions   GABAPENTIN (NEURONTIN) 300 MG CAPSULE    Take 1 capsule (300 mg total) by mouth 3 (three) times daily.    Follow Up: [facility] [address]     [place]  [phone] [facility] [place] [phone] mc [address] [place]       Dhanya Bogle, Gwenyth Allegra, MD 09/02/19 1320

## 2019-09-02 NOTE — ED Triage Notes (Signed)
Patient c/o right sided headache since yesterday. Patient denies any N/V or blurred vision.

## 2019-09-02 NOTE — Discharge Instructions (Signed)
Your history, exam, and work-up lead [place] to believe that your pain in the right side of your head is from trigeminal neuralgia.  After speaking with neurology, they recommended increasing your gabapentin to taking 100 mg 3 times a day and in several days increasing it to 300 mg 3 times a day.  I also recommend following up outpatient neurology clinic for further management.  If you have any new or worsened symptoms including stroke symptoms such as new numbness, tingling, weakness, or vision changes, please return to nearest emergency department.

## 2019-09-02 NOTE — ED Notes (Signed)
Patient transported to MRI 

## 2019-09-03 ENCOUNTER — Encounter: Payer: Self-pay | Admitting: Internal Medicine

## 2019-09-08 ENCOUNTER — Other Ambulatory Visit: Payer: Self-pay

## 2019-09-08 ENCOUNTER — Encounter: Payer: Self-pay | Admitting: Neurology

## 2019-09-08 ENCOUNTER — Ambulatory Visit: Payer: Medicare Other | Admitting: Neurology

## 2019-09-08 VITALS — BP 119/72 | HR 72 | Temp 98.0°F | Ht 61.5 in | Wt 140.0 lb

## 2019-09-08 DIAGNOSIS — M5481 Occipital neuralgia: Secondary | ICD-10-CM

## 2019-09-08 DIAGNOSIS — M7918 Myalgia, other site: Secondary | ICD-10-CM | POA: Diagnosis not present

## 2019-09-08 DIAGNOSIS — M542 Cervicalgia: Secondary | ICD-10-CM

## 2019-09-08 MED ORDER — GABAPENTIN 100 MG PO CAPS: 100.0000 mg | ORAL_CAPSULE | Freq: Three times a day (TID) | ORAL | 6 refills | Status: DC | PRN

## 2019-09-08 NOTE — Progress Notes (Signed)
[facility]    Provider:  Dr Jaynee Eagles Requesting Provider: Tegeler, Gwenyth Allegra,* Primary Care Provider:  Virgie Dad, MD  CC:  Scalp pain  HPI:  Carol Perez is a 79 y.o. female here as requested by Tegeler, Gwenyth Allegra,* for head pain. She woke up one morning with pain in the right scalp. Impulses. They got severe that evening, she couldn't sleep, she tried CBD and melatonin, shooting pain behind the ear, not burning, she took Gabapentin (she had some for her low back pain and sciatica). She has seen a neurosurgeon and he was worried about her neck. She has a lot of neck pain. The pain is gone. The symptoms are better and she has not had it for a few days. Her low back is better. But she has a lot of neck problems, she has seen a neurosurgeon in the past. She exercises at least 5 morning a week, she exercises her neck. She has tightness in her neck. She has very tight neck muscles.   Reviewed notes, labs and imaging from outside physicians, which showed:  Personally reviewed images of MRI of the brain and MRI trigeminal protocol, essentially normal for age, some very minimal white matter changes, no vascular compression.  Bmp,cbc unremarkable  Reviewed notes from emergency room visit where she was seen for right temporal sharp pain which does not radiate she reported 7 out of 10 pain which was sudden and lasting for 2 days waxing and waning no worsening to bright light coughing or straining, no other associated symptoms, examination and neurologic examination were unremarkable and nonfocal.  She did not have tenderness in the area, her pains could not be provoked, there were no rashes or focal deficits on exam, normal gait, they were concerned for trigeminal neuralgia.  She was given gabapentin 3 times daily.  Review of Systems: Patient complains of symptoms per HPI as well as the following symptoms: neck pain, muscle tightness. Pertinent negatives and positives per  HPI. All others negative.   Social History   Socioeconomic History  . Marital status: Married    Spouse name: Not on file  . Number of children: 2  . Years of education: Not on file  . Highest education level: Associate degree: occupational, Hotel manager, or vocational program  Occupational History  . Occupation: Retired   Tobacco Use  . Smoking status: Former Smoker    Start date: 06/12/1977  . Smokeless tobacco: Never Used  . Tobacco comment: 42 years ago. Smoked from age [age]  Substance and Sexual Activity  . Alcohol use: Not Currently    Alcohol/week: 7.0 standard drinks    Types: 7 Glasses of wine per week    Comment: was a glass of wine daily  . Drug use: No  . Sexual activity: Not on file  Other Topics Concern  . Not on file  Social History Narrative   Tobacco use, amount per day now: No   Past tobacco use, amount per day: Smoked ages [age]   How many years did you use tobacco: 17-20 years   Alcohol use (drinks per week): 1 glass of Red Wine per day.   Diet: Good   Do you drink/eat things with caffeine: No   Marital status: Married                                  What year were you married? 1978   Do  you live in a house, apartment, assisted living, condo, trailer, etc.? Apartment/Independent Living   Is it one or more stories? 1   How many persons live in your home? 2    Do you have pets in your home?( please list) No   Highest Level of Education completed? 2 years of college.    Current or past profession: Retired Futures trader   Do you exercise?  Yes                                Type and how often? 28min workout 5 days week. Walking 3-4 Days a Week.    Do you have a living will? Yes   Do you have a DNR form?  Yes                                 If not, do you want to discuss one?   Do you have signed POA/HPOA forms? Yes                       If so, please bring to you appointment      Do you have difficulty bathing or dressing yourself? No   Do you have  difficulty preparing food or eating? No   Do you have difficulty managing your medications? No   Do you have difficulty managing your finances? No   Do you have difficulty affording your medications? No       Per [facility] New Patient Packet. Abstracted by Jasmine/RMA.    Social Determinants of Health   Financial Resource Strain:   . Difficulty of Paying Living Expenses:   Food Insecurity:   . Worried About Charity fundraiser in the Last Year:   . Arboriculturist in the Last Year:   Transportation Needs:   . Film/video editor (Medical):   Marland Kitchen Lack of Transportation (Non-Medical):   Physical Activity:   . Days of Exercise per Week:   . Minutes of Exercise per Session:   Stress:   . Feeling of Stress :   Social Connections:   . Frequency of Communication with Friends and Family:   . Frequency of Social Gatherings with Friends and Family:   . Attends Religious Services:   . Active Member of Clubs or Organizations:   . Attends Archivist Meetings:   Marland Kitchen Marital Status:   Intimate Partner Violence:   . Fear of Current or Ex-Partner:   . Emotionally Abused:   Marland Kitchen Physically Abused:   . Sexually Abused:     Family History  Problem Relation Age of Onset  . Cancer Mother   . Cancer Father     Past Medical History:  Diagnosis Date  . Barrett's esophagus   . Colitis   . DDD (degenerative disc disease)   . Erroneous encounter - disregard    error   . Erroneous encounter - disregard   . Erroneous encounter - disregard   . Factor 5 Leiden mutation, heterozygous [facility])    Per [place] Patient Packet.  . Frequent UTI   . H/O echocardiogram 04/01/2019   Per [place] Patient Packet.  . Hiatal hernia    Per [place] Patient Packet.  . High grade dysplasia of Barrett's epithelium    Per [facility] New Patient Packet.  Marland Kitchen History of  bladder infections    Per [facility] New Patient Packet.  Marland Kitchen History of bone density study 07/12/2018   Per [place] Patient Packet.  Marland Kitchen History of bone density  study 07/12/2018   Up coming appointment 07/17/2019, Per [facility] New Patient Packet  . History of bone density study 07/17/2019  . History of endoscopy 12/17/2015   Needed Every 3 years. Scheduled for January 2021. Per [facility] New Patient Packet.   Marland Kitchen History of mammogram 04/14/2019   Per [place] Patient Packet.  Marland Kitchen History of MRI 03/01/2019   By Dr.Gulati/ Neurologist. MRI of Brain. Per [facility] New Patient Packet.  Marland Kitchen History of MRI 04/23/2019   By Dr[facility] Maryan Rued at [facility]. Per [facility] New Patient Packet.  Marland Kitchen History of Papanicolaou smear of cervix 10/16/2011   Per [place] Patient Packet  . Hypotension    Per [facility] New Patient Packet.  . IBS (irritable bowel syndrome)    Per [place] New Patient Packet.  . Lactose intolerance   . Osteoarthritis    Per [facility] New Patient Packet.  Marland Kitchen Osteopenia    Per [place] New Patient Packet.  . Osteoporosis   . Premature ventricular complex   . Raynaud's disease    Per [facility] New Patient Packet.  . Scoliosis    Per [place] New Patient Packet.    Patient Active Problem List   Diagnosis Date Noted  . Occipital neuralgia of right side 09/08/2019  . Recurrent UTI 06/26/2019  . Osteoporosis 06/26/2019  . IBS (irritable bowel syndrome) 06/26/2019  . Hyperlipidemia 06/26/2019  . Back pain 06/26/2019  . Barrett's esophagus 06/26/2019    Past Surgical History:  Procedure Laterality Date  . ABDOMINAL HYSTERECTOMY  06/13/2011   By Dr.Scherer at [facility] Per [facility] New Patient Packet.  Marland Kitchen BREAST EXCISIONAL BIOPSY Left 30+ yrs ago  . COLONOSCOPY  11/23/2011   By Dr.McCune at [facility]. Per [place] Patient Packet.  . COLONOSCOPY  08/26/2019  . MAMMOGRAM  04/14/2019  . pap smear  10/16/2011  . SIGMOIDOSCOPY  11/02/2014   Per [place] New Patient Packet  . TONSILLECTOMY  06/12/1948   Per [place] New Patient Packet.    Current Outpatient Medications  Medication Sig Dispense Refill  . aspirin EC 81 MG tablet Take 81 mg by mouth 3 (three) times daily.       . B Complex Vitamins (VITAMIN B-COMPLEX) TABS Take 1 tablet by mouth daily. With vitamin C    . Biotin 5000 MCG TABS Take 1 tablet by mouth daily.    Marland Kitchen CALCIUM PO Take 1,200 mg by mouth daily.     . Cholecalciferol (D3-1000 PO) Take by mouth daily. 2,000 IU    . conjugated estrogens (PREMARIN) vaginal cream Place 1 Applicatorful vaginally 3 (three) times a week. 42.5 g 12  . denosumab (PROLIA) 60 MG/ML SOSY injection Inject 60 mg into the skin every 6 (six) months. Last injection 05/01/2019.    . gabapentin (NEURONTIN) 100 MG capsule Take 100 mg by mouth daily as needed for pain.    Marland Kitchen gabapentin (NEURONTIN) 300 MG capsule Take 1 capsule (300 mg total) by mouth 3 (three) times daily. (Patient taking differently: Take 300 mg by mouth at bedtime. ) 30 capsule 0  . Glucosamine HCl 1000 MG TABS Take 1 tablet by mouth 2 (two) times daily.    Marland Kitchen Lifitegrast (XIIDRA OP) Apply 1 drop to eye 2 (two) times daily.     Marland Kitchen loperamide (IMODIUM) 2 MG capsule  Take 2 mg by mouth as needed for diarrhea or loose stools.    . magnesium oxide (MAG-OX) 400 MG tablet Take 400 mg by mouth daily.    . methenamine (HIPREX) 1 g tablet Take 1 g by mouth 2 (two) times daily with a meal. For UTI Prevention. Last UTI: October 20th, November 5th, and December 28th.     . Methenamine-Sodium Salicylate (CYSTEX PO) Take 100 mg by mouth daily. ( Cranberry Concentrate )    . Multiple Vitamins-Minerals (ONE-A-DAY WOMENS PO) Take 1 tablet by mouth daily.    . Multiple Vitamins-Minerals (PRESERVISION AREDS 2 PO) Take 1 tablet by mouth daily.    . NON FORMULARY CBD Full spectrum 1 ml as needed for sleep.    Marland Kitchen Omeprazole-Sodium Bicarbonate (ZEGERID PO) Take 20 mg by mouth 2 (two) times daily.    . Polyvinyl Alcohol-Povidone (REFRESH OP) Apply 1 drop to eye as needed.    . Probiotic Product (PROBIOTIC COLON SUPPORT PO) Take 1 tablet by mouth daily.    . Turmeric (QC TUMERIC COMPLEX) 500 MG CAPS Take 1 capsule by mouth daily.    .  valACYclovir (VALTREX) 500 MG tablet Take 500 mg by mouth daily as needed (flareup).    . gabapentin (NEURONTIN) 100 MG capsule Take 1 capsule (100 mg total) by mouth 3 (three) times daily as needed. 90 capsule 6   No current facility-administered medications for this visit.    Allergies as of 09/08/2019 - Review Complete 09/08/2019  Allergen Reaction Noted  . Aspirin Other (See Comments) 01/14/2011  . Avelox [moxifloxacin]  08/03/2013  . Banana  09/08/2019  . Ciprofloxacin hcl  08/03/2013  . Levofloxacin  08/03/2013  . Norfloxacin  08/03/2013  . Nsaids Other (See Comments) 01/14/2011  . Ofloxacin  08/03/2013  . Other  09/08/2019  . Penicillins Hives 01/14/2011  . Tape  04/12/2017  . Tequin [gatifloxacin]  08/03/2013  . Latex Swelling and Rash 01/14/2011  . Methylisothiazolinone Rash 04/12/2017    Vitals: BP 119/72 (BP Location: Left Arm, Patient Position: Sitting)   Pulse 72   Temp 98 F (36.7 C) Comment: taken at front  Ht 5' 1.5" (1.562 m)   Wt 140 lb (63.5 kg)   BMI 26.02 kg/m  Last Weight:  Wt Readings from Last 1 Encounters:  09/08/19 140 lb (63.5 kg)   Last Height:   Ht Readings from Last 1 Encounters:  09/08/19 5' 1.5" (1.562 m)     Physical exam: Exam: Gen: NAD, conversant, well nourised, well groomed                     CV: RRR, no MRG. No Carotid Bruits. No peripheral edema, warm, nontender Eyes: Conjunctivae clear without exudates or hemorrhage  Neuro: Detailed Neurologic Exam  Speech:    Speech is normal; fluent and spontaneous with normal comprehension.  Cognition:    The patient is oriented to person, place, and time;     recent and remote memory intact;     language fluent;     normal attention, concentration,     fund of knowledge Cranial Nerves:    The pupils are equal, round, and reactive to light.  Fundi flat. Visual fields are full to finger confrontation. Extraocular movements are intact. Trigeminal sensation is intact and the muscles  of mastication are normal. The face is symmetric. The palate elevates in the midline. Hearing intact. Voice is normal. Shoulder shrug is normal. The tongue has normal motion without fasciculations.  Coordination:    No dysmetria  Gait:    Normal native gait  Motor Observation:    No asymmetry, no atrophy, and no involuntary movements noted. Tone:    Normal muscle tone.    Posture:    Posture is normal. normal erect    Strength:    Strength is V/V in the upper and lower limbs.      Sensation: intact to LT     Reflex Exam:  DTR's:    Deep tendon reflexes in the upper and lower extremities are normal bilaterally.   Toes:    The toes are downgoing bilaterally.   Clonus:    Clonus is absent.    Assessment/Plan: This is a really lovely 79 year old who woke up with scalp pain, sharp, in the occipital area on the right.  By report she has significant arthritic changes in the neck, I think this is likely occipital nerve irritation, the symptoms have resolved, we did talk about her neck pain, at this time nothing concerning to order MRI of the cervical spine, exam is nonfocal, she has no radiating symptoms into the arms and her occipital neuralgia has resolved.  The gabapentin 300 mg was too high a dose for her, I did refill her Neurontin at a lower dose 100 mg.  We did talk about cervical pillows, possible MRI of the cervical spine in the future, injections into the neck such as epidural steroid injections, occipital nerve blocks if needed.  At this time I will send her to physical therapy she does have a lot of cervical muscle hypertrophy and tenderness to palpation but I do think that this is likely occipital neuralgia either due to arthritic changes in the neck or tightness of the cervical musculature.  Dry needling - Dry Needling for occipital neuralgia and very tight musculoskeletal cervical muscles/myofascial cervical pain.  Orders Placed This Encounter  Procedures  . Ambulatory  referral to Physical Therapy   Meds ordered this encounter  Medications  . gabapentin (NEURONTIN) 100 MG capsule    Sig: Take 1 capsule (100 mg total) by mouth 3 (three) times daily as needed.    Dispense:  90 capsule    Refill:  6    Cc: Tegeler, Nickola Major, MD  Sarina Ill, MD  [facility] [address] [place], [place] [address]  Phone [phone] Fax [phone]

## 2019-09-08 NOTE — Patient Instructions (Addendum)
Gabapentin as needed   Occipital Neuralgia  Occipital neuralgia is a type of headache that causes brief episodes of very bad pain in the back of your head. Pain from occipital neuralgia may spread (radiate) to other parts of your head. These headaches may be caused by irritation of the nerves that leave your spinal cord high up in your neck, just below the base of your skull (occipital nerves). Your occipital nerves transmit sensations from the back of your head, the top of your head, and the areas behind your ears. What are the causes? This condition can occur without any known cause (primary headache syndrome). In other cases, this condition is caused by pressure on or irritation of one of the two occipital nerves. Pressure and irritation may be due to:  Muscle spasm in the neck.  Neck injury.  Wear and tear of the vertebrae in the neck (osteoarthritis).  Disease of the disks that separate the vertebrae.  Swollen blood vessels that put pressure on the occipital nerves.  Infections.  Tumors.  Diabetes. What are the signs or symptoms? This condition causes brief burning, stabbing, electric, shocking, or shooting pain which can radiate to the top of the head. It can happen on one side or both sides of the head. It can also cause:  Pain behind the eye.  Pain triggered by neck movement or hair brushing.  Scalp tenderness.  Aching in the back of the head between episodes of very bad pain.  Pain gets worse with exposure to bright lights. How is this diagnosed? There is no test that diagnoses this condition. Your health care provider may diagnose this condition based on a physical exam and your symptoms. Other tests may be done, such as:  Imaging studies of the brain and neck (cervical spine), such as an MRI or CT scan. These look for causes of pinched nerves.  Applying pressure to the nerves in the neck to try to re-create the pain.  Injection of numbing medicine into the  occipital nerve areas to see if pain goes away (diagnostic nerve block). How is this treated? Treatment for this condition may begin with simple measures, such as:  Rest.  Massage.  Applying heat or cold on the area.  Over-the-counter pain relievers. If these measures do not work, you may need other treatments, including:  Medicines, such as: ? Prescription-strength anti-inflammatory medicines. ? Muscle relaxants. ? Anti-seizure medicines, which can relieve pain. ? Antidepressants, which can relieve pain. ? Injected medicines, such as medicines that numb the area (local anesthetic) and steroids.  Pulsed radiofrequency ablation. This is when wires are implanted to deliver electrical impulses that block pain signals from the occipital nerve.  Surgery to relieve nerve pressure.  Physical therapy. Follow these instructions at home: Pain management      Avoid any activities that cause pain.  Rest when you have an attack of pain.  Try gentle massage to relieve pain.  Try a different pillow or sleeping position.  If directed, apply heat to the affected area as told by your health care provider. Use the heat source that your health care provider recommends, such as a moist heat pack or a heating pad. ? Place a towel between your skin and the heat source. ? Leave the heat on for 20-30 minutes. ? Remove the heat if your skin turns bright red. This is especially important if you are unable to feel pain, heat, or cold. You may have a greater risk of getting burned.  If  directed, apply ice to the back of the head and neck area as told by your health care provider. ? Put ice in a plastic bag. ? Place a towel between your skin and the bag. ? Leave the ice on for 20 minutes, 2-3 times per day. General instructions  Take over-the-counter and prescription medicines only as told by your health care provider.  Avoid things that make your symptoms worse, such as bright lights.  Try  to stay active. Get regular exercise that does not cause pain. Ask your health care provider to suggest safe exercises for you.  Work with a physical therapist to learn stretching exercises you can do at home.  Practice good posture.  Keep all follow-up visits as told by your health care provider. This is important. Contact a health care provider if:  Your medicine is not working.  You have new or worsening symptoms. Get help right away if:  You have very bad head pain that does not go away.  You have a sudden change in vision, balance, or speech. Summary  Occipital neuralgia is a type of headache that causes brief episodes of very bad pain in the back of your head.  Pain from occipital neuralgia may spread (radiate) to other parts of your head.  Treatment for this condition includes rest, massage, and medicines. This information is not intended to replace advice given to you by your health care provider. Make sure you discuss any questions you have with your health care provider. Document Revised: 05/15/2017 Document Reviewed: 08/03/2016 Elsevier Patient Education  [place].  Gabapentin capsules or tablets What is this medicine? GABAPENTIN (GA ba pen tin) is used to control seizures in certain types of epilepsy. It is also used to treat certain types of nerve pain. This medicine may be used for other purposes; ask your health care provider or pharmacist if you have questions. COMMON BRAND NAME(S): Active-PAC with Gabapentin, Gabarone, Neurontin What should I tell my health care provider before I take this medicine? They need to know if you have any of these conditions:  history of drug abuse or alcohol abuse problem  kidney disease  lung or breathing disease  suicidal thoughts, plans, or attempt; a previous suicide attempt by you or a family member  an unusual or allergic reaction to gabapentin, other medicines, foods, dyes, or preservatives  pregnant or  trying to get pregnant  breast-feeding How should I use this medicine? Take this medicine by mouth with a glass of water. Follow the directions on the prescription label. You can take it with or without food. If it upsets your stomach, take it with food. Take your medicine at regular intervals. Do not take it more often than directed. Do not stop taking except on your doctor's advice. If you are directed to break the 600 or 800 mg tablets in half as part of your dose, the extra half tablet should be used for the next dose. If you have not used the extra half tablet within 28 days, it should be thrown away. A special MedGuide will be given to you by the pharmacist with each prescription and refill. Be sure to read this information carefully each time. Talk to your pediatrician regarding the use of this medicine in children. While this drug may be prescribed for children as young as 3 years for selected conditions, precautions do apply. Overdosage: If you think you have taken too much of this medicine contact a poison control center or emergency room  at once. NOTE: This medicine is only for you. Do not share this medicine with others. What if I miss a dose? If you miss a dose, take it as soon as you can. If it is almost time for your next dose, take only that dose. Do not take double or extra doses. What may interact with this medicine? This medicine may interact with the following medications:  alcohol  antihistamines for allergy, cough, and cold  certain medicines for anxiety or sleep  certain medicines for depression like amitriptyline, fluoxetine, sertraline  certain medicines for seizures like phenobarbital, primidone  certain medicines for stomach problems  general anesthetics like halothane, isoflurane, methoxyflurane, propofol  local anesthetics like lidocaine, pramoxine, tetracaine  medicines that relax muscles for surgery  narcotic medicines for pain  phenothiazines like  chlorpromazine, mesoridazine, prochlorperazine, thioridazine This list may not describe all possible interactions. Give your health care provider a list of all the medicines, herbs, non-prescription drugs, or dietary supplements you use. Also tell them if you smoke, drink alcohol, or use illegal drugs. Some items may interact with your medicine. What should I watch for while using this medicine? Visit your doctor or health care provider for regular checks on your progress. You may want to keep a record at home of how you feel your condition is responding to treatment. You may want to share this information with your doctor or health care provider at each visit. You should contact your doctor or health care provider if your seizures get worse or if you have any new types of seizures. Do not stop taking this medicine or any of your seizure medicines unless instructed by your doctor or health care provider. Stopping your medicine suddenly can increase your seizures or their severity. This medicine may cause serious skin reactions. They can happen weeks to months after starting the medicine. Contact your health care provider right away if you notice fevers or flu-like symptoms with a rash. The rash may be red or purple and then turn into blisters or peeling of the skin. Or, you might notice a red rash with swelling of the face, lips or lymph nodes in your neck or under your arms. Wear a medical identification bracelet or chain if you are taking this medicine for seizures, and carry a card that lists all your medications. You may get drowsy, dizzy, or have blurred vision. Do not drive, use machinery, or do anything that needs mental alertness until you know how this medicine affects you. To reduce dizzy or fainting spells, do not sit or stand up quickly, especially if you are an older patient. Alcohol can increase drowsiness and dizziness. Avoid alcoholic drinks. Your mouth may get dry. Chewing sugarless gum or  sucking hard candy, and drinking plenty of water will help. The use of this medicine may increase the chance of suicidal thoughts or actions. Pay special attention to how you are responding while on this medicine. Any worsening of mood, or thoughts of suicide or dying should be reported to your health care provider right away. Women who become pregnant while using this medicine may enroll in the [place] Pregnancy Registry by calling [phone]. This registry collects information about the safety of antiepileptic drug use during pregnancy. What side effects may I notice from receiving this medicine? Side effects that you should report to your doctor or health care professional as soon as possible:  allergic reactions like skin rash, itching or hives, swelling of the face, lips, or tongue  breathing problems  rash, fever, and swollen lymph nodes  redness, blistering, peeling or loosening of the skin, including inside the mouth  suicidal thoughts, mood changes Side effects that usually do not require medical attention (report to your doctor or health care professional if they continue or are bothersome):  dizziness  drowsiness  headache  nausea, vomiting  swelling of ankles, feet, hands  tiredness This list may not describe all possible side effects. Call your doctor for medical advice about side effects. You may report side effects to FDA at 1-800-FDA-1088. Where should I keep my medicine? Keep out of reach of children. This medicine may cause accidental overdose and death if it taken by other adults, children, or pets. Mix any unused medicine with a substance like cat litter or coffee grounds. Then throw the medicine away in a sealed container like a sealed bag or a coffee can with a lid. Do not use the medicine after the expiration date. Store at room temperature between 15 and 30 degrees C (59 and 86 degrees F). NOTE: This sheet is a summary. It may not  cover all possible information. If you have questions about this medicine, talk to your doctor, pharmacist, or health care provider.  2020 Elsevier/Gold Standard (2018-08-30 14:16:43)

## 2019-09-26 ENCOUNTER — Ambulatory Visit: Payer: Medicare Other | Attending: Neurology | Admitting: Physical Therapy

## 2019-09-26 ENCOUNTER — Other Ambulatory Visit: Payer: Self-pay

## 2019-09-26 ENCOUNTER — Encounter: Payer: Self-pay | Admitting: Physical Therapy

## 2019-09-26 DIAGNOSIS — M542 Cervicalgia: Secondary | ICD-10-CM

## 2019-09-26 DIAGNOSIS — R293 Abnormal posture: Secondary | ICD-10-CM | POA: Diagnosis present

## 2019-09-26 NOTE — Patient Instructions (Signed)

## 2019-09-26 NOTE — Therapy (Signed)
[place], [place], [address] Phone: [phone]   Fax:  [phone]  Physical Therapy Evaluation  Patient Details  Name: Carol Perez MRN: [id number] Date of Birth: [birth year] Referring Provider (PT): Sarina Ill, MD   Encounter Date: 09/26/2019  PT End of Session - 09/26/19 1457    Visit Number  1    Number of Visits  12    Date for PT Re-Evaluation  11/07/19    Authorization Type  [employer]HC Medicare    PT Start Time  1100    PT Stop Time  1146    PT Time Calculation (min)  46 min    Activity Tolerance  Patient tolerated treatment well    Behavior During Therapy  [facility] for tasks assessed/performed       Past Medical History:  Diagnosis Date  . Barrett's esophagus   . Colitis   . DDD (degenerative disc disease)   . Erroneous encounter - disregard    error   . Erroneous encounter - disregard   . Erroneous encounter - disregard   . Factor 5 Leiden mutation, heterozygous [facility])    Per [place] Patient Packet.  . Frequent UTI   . H/O echocardiogram 04/01/2019   Per [place] Patient Packet.  . Hiatal hernia    Per [place] Patient Packet.  . High grade dysplasia of Barrett's epithelium    Per [facility] New Patient Packet.  Marland Kitchen History of bladder infections    Per [facility] New Patient Packet.  Marland Kitchen History of bone density study 07/12/2018   Per [place] Patient Packet.  Marland Kitchen History of bone density study 07/12/2018   Up coming appointment 07/17/2019, Per [facility] New Patient Packet  . History of bone density study 07/17/2019  . History of endoscopy 12/17/2015   Needed Every 3 years. Scheduled for January 2021. Per [facility] New Patient Packet.   Marland Kitchen History of mammogram 04/14/2019   Per [place] Patient Packet.  Marland Kitchen History of MRI 03/01/2019   By Dr.Gulati/ Neurologist. MRI of Brain. Per [facility] New Patient Packet.  Marland Kitchen History of MRI 04/23/2019   By Dr. Maryan Rued at [facility]. Per [facility] New Patient Packet.  Marland Kitchen History of Papanicolaou  smear of cervix 10/16/2011   Per [place] Patient Packet  . Hypotension    Per [facility] New Patient Packet.  . IBS (irritable bowel syndrome)    Per [place] New Patient Packet.  . Lactose intolerance   . Osteoarthritis    Per [facility] New Patient Packet.  Marland Kitchen Osteopenia    Per [place] New Patient Packet.  . Osteoporosis   . Premature ventricular complex   . Raynaud's disease    Per [facility] New Patient Packet.  . Scoliosis    Per [place] New Patient Packet.    Past Surgical History:  Procedure Laterality Date  . ABDOMINAL HYSTERECTOMY  06/13/2011   By Dr.Scherer at [facility]. Per [facility] New Patient Packet.  Marland Kitchen BREAST EXCISIONAL BIOPSY Left 30+ yrs ago  . COLONOSCOPY  11/23/2011   By Dr.McCune at [facility]. Per [place] Patient Packet.  . COLONOSCOPY  08/26/2019  . MAMMOGRAM  04/14/2019  . pap smear  10/16/2011  . SIGMOIDOSCOPY  11/02/2014   Per [place] New Patient Packet  . TONSILLECTOMY  06/12/1948   Per [place] New Patient Packet.    There were no vitals filed for this visit.   Subjective Assessment - 09/26/19 1437    Subjective  Pt. is a 79 y/o female referred to PT for right occipital neuralgia and cervical myofascial pain. She reports woke up one morning a few weeks ago with acute onset right temporal/occipital region pain-no mechanism of injury noted. She went to ED with brain MRI performed which was (-). Previous multi-year history of back and neck pain issues with chronic neck muscle tension. Pt. reports tried therapy in the past (for back with thoracic region pain) but has had persisted region of pain and tension in right thoracic paraspinals which has not improved and is aggravated with arm use and lifting as well as walking. She saw neurologist with current occipital neuralgia diagnosis with associated myofascial tightness and pain. She reports that head symptoms improved about 3 days after seeing neurologist but has had some persisted myofascial tension and thoracic discomfort as  noted.    Pertinent History  osteoporosis, chronic back/neck pain, scoliosis, Raynaud's, shingles with recent flare up prior to symptom onset, IBS    Limitations  House hold activities;Lifting;Standing;Walking    Diagnostic tests  Brain MRI    Patient Stated Goals  Get rid of pain    Currently in Pain?  No/denies         [facility] PT Assessment - 09/26/19 0001      Assessment   Medical Diagnosis  occipital neuralgia right side, cervical myofascial pain syndrome, cervical arthralgia    Referring Provider (PT)  Sarina Ill, MD    Onset Date/Surgical Date  09/02/19    Prior Therapy  past PT for back/spine pain but no PT yet for current episode      Precautions   Precaution Comments  osteoporosis, Raynaud's-no cryo      Restrictions   Weight Bearing Restrictions  No      Balance Screen   Has the patient fallen in the past 6 months  No      [place] living   Lives at "[facility]" with husband   Home Equipment  Shower seat - built in      Prior Function   Level of [place] with basic ADLs      Cognition   Overall Cognitive Status  Within Functional Limits for tasks assessed      Observation/Other Assessments   Focus on Therapeutic Outcomes (FOTO)   43% limited      Sensation   Light Touch  Appears Intact      Posture/Postural Control   Posture Comments  Forward head with rounded shoulders and right scapula elevated > left, right convex scoilosis noted      ROM / Strength   AROM / PROM / Strength  AROM;Strength      AROM   AROM Assessment Site  Shoulder;Cervical    Right/Left Shoulder  --    Right Shoulder Flexion  160 Degrees    Right Shoulder ABduction  170 Degrees    Right Shoulder Internal Rotation  --   reach to T5   Right Shoulder External Rotation  60 Degrees   at 0 deg abd   Left Shoulder Flexion  160 Degrees    Left Shoulder ABduction  170 Degrees    Left Shoulder Internal Rotation  --   reach to  T5   Left Shoulder External Rotation  60 Degrees   at 0 deg abd   Cervical Flexion  45   "pulling" in upper trapezius region   Cervical Extension  35    Cervical - Right  Side Bend  35    Cervical - Left Side Bend  35    Cervical - Right Rotation  65    Cervical - Left Rotation  70      Strength   Overall Strength Comments  MMTs not tested due to osteoporosis history      Flexibility   Soft Tissue Assessment /Muscle Length  --   general tightness in bilat. cervical/upper trapezius region     Palpation   Palpation comment  Tightness but minimal tenderness to palpation in bilat. upper trapezius, levator, cervical paraspinals and SCM, tight with tenderness to palpation in right thoracic longissimus at T7 region                Objective measurements completed on examination: See above findings.        Trigger Point Dry Needling - 09/26/19 0001    Consent Given?  Yes    Education Handout Provided  Yes    Muscles Treated Head and Neck  Upper trapezius;Levator scapulae    Muscles Treated Back/Hip  Erector spinae   right thoracic longissimus at T7 region   Dry Needling Comments  needling to bilat. cervical muscles as noted with 32 gauge 30 mm needles, needling to right thoracic longissimus with "shelf" technique with transverse insertion using 32 gauge 30 mm needle    Upper Trapezius Response  Twitch reponse elicited           PT Education - 09/26/19 1456    Education Details  dry needling, POC    Person(s) Educated  Patient    Methods  Explanation    Comprehension  Verbalized understanding          PT Long Term Goals - 09/26/19 1505      PT LONG TERM GOAL #1   Title  Independent with HEP    Baseline  needs HEP    Time  6    Period  Weeks    Status  New    Target Date  11/07/19      PT LONG TERM GOAL #2   Title  Perform light lifting for chores and arm/shoulder ROM for ADLs without limitation due to neck or back pain    Baseline  limited due to  right thoracic region pain    Time  6    Period  Weeks    Status  New    Target Date  11/07/19      PT LONG TERM GOAL #3   Title  Perform walking exercise and be able to participate in community exercise classes such as Tai Chi without limitation due to neck or back pain    Baseline  reports tried Tai Chi class but had to stop due to thoracic pain    Time  6    Period  Weeks    Status  New    Target Date  11/07/19      PT LONG TERM GOAL #4   Title  FOTO outcome measure score 42% or less impairment    Time  6    Period  Weeks    Status  New    Target Date  11/07/19      PT LONG TERM GOAL #5   Title  Right cervical AROM = left to improve ability to turn head while driving    Baseline  65    Time  6    Period  Weeks    Status  New  Target Date  11/07/19             Plan - 09/26/19 1458    Clinical Impression Statement  Pt. present with recent occipital neuralgia symptoms now improving but with associated/underlying cervical and thoracic myofascial tension and pain with contributing factors from posture and underlying scoliosis and spinal degenerative changes. Pt. would benefit from PT to help relieve pain and address associated functional limitations.    Personal Factors and Comorbidities  Age;Comorbidity 3+    Comorbidities  scoliosis, chronic spine pain, osteoporosis    Examination-Activity Limitations  Lift;Reach Overhead;Locomotion Level    Examination-Participation Restrictions  Community Activity   difficulty exercising-reports tried tai chi but unable due to thoracic pain   Stability/Clinical Decision Making  Evolving/Moderate complexity    Clinical Decision Making  Moderate    Rehab Potential  Good    PT Frequency  --   1-2x/week   PT Duration  6 weeks    PT Treatment/Interventions  ADLs/Self Care Home Management;Ultrasound;Electrical Stimulation;Moist Heat;Therapeutic activities;Therapeutic exercise;Neuromuscular re-education;Patient/family education;Manual  techniques;Dry needling;Taping    PT Next Visit Plan  caution:osteoporosis-no traction or spinal mobs, no cryo due to Raynaud's, check response dry needling and continue as found beneficial, no time for HEP at eval so instruct as appropriate for gentle stretches and postural training, STM/myofascial release-suboccipital release, upper trap and right>left thoracic paraspinal region, try cervical retractions, upper trapezius/levator/pec stretches, scapular retractions-add bands for postural exercises as tolerated    PT Home Exercise Plan  will instruct at future session as appropriate    Consulted and Agree with Plan of Care  Patient       Patient will benefit from skilled therapeutic intervention in order to improve the following deficits and impairments:  Pain, Postural dysfunction, Impaired flexibility, Decreased activity tolerance, Increased muscle spasms, Difficulty walking, Increased fascial restricitons  Visit Diagnosis: Cervicalgia  Abnormal posture     Problem List Patient Active Problem List   Diagnosis Date Noted  . Occipital neuralgia of right side 09/08/2019  . Recurrent UTI 06/26/2019  . Osteoporosis 06/26/2019  . IBS (irritable bowel syndrome) 06/26/2019  . Hyperlipidemia 06/26/2019  . Back pain 06/26/2019  . Barrett's esophagus 06/26/2019    Beaulah Dinning, PT, DPT 09/26/19 3:10 PM  [facility] [address] [place], [place], [address] Phone: [phone]   Fax:  [phone]  Name: Carol Perez MRN: [id number] Date of Birth: 08/08/1940

## 2019-10-01 ENCOUNTER — Other Ambulatory Visit: Payer: Self-pay

## 2019-10-01 ENCOUNTER — Ambulatory Visit: Payer: Medicare Other | Admitting: Physical Therapy

## 2019-10-01 ENCOUNTER — Encounter: Payer: Self-pay | Admitting: Physical Therapy

## 2019-10-01 DIAGNOSIS — M542 Cervicalgia: Secondary | ICD-10-CM

## 2019-10-01 DIAGNOSIS — R293 Abnormal posture: Secondary | ICD-10-CM

## 2019-10-01 NOTE — Therapy (Signed)
[place], [place], [address] Phone: [phone]   Fax:  [phone]  Physical Therapy Treatment  Patient Details  Name: Carol Perez MRN: [id number] Date of Birth: [birth year] Referring Provider (PT): Sarina Ill, MD   Encounter Date: 10/01/2019  PT End of Session - 10/01/19 1123    Visit Number  2    Number of Visits  12    Date for PT Re-Evaluation  11/07/19    Authorization Type  UHC Medicare    PT Start Time  1015    PT Stop Time  1100    PT Time Calculation (min)  45 min    Activity Tolerance  Patient tolerated treatment well    Behavior During Therapy  [facility] for tasks assessed/performed       Past Medical History:  Diagnosis Date  . Barrett's esophagus   . Colitis   . DDD (degenerative disc disease)   . Erroneous encounter - disregard    error   . Erroneous encounter - disregard   . Erroneous encounter - disregard   . Factor 5 Leiden mutation, heterozygous [facility])    Per [place] Patient Packet.  . Frequent UTI   . H/O echocardiogram 04/01/2019   Per [place] Patient Packet.  . Hiatal hernia    Per [place] Patient Packet.  . High grade dysplasia of Barrett's epithelium    Per [facility] New Patient Packet.  Marland Kitchen History of bladder infections    Per [facility] New Patient Packet.  Marland Kitchen History of bone density study 07/12/2018   Per [place] Patient Packet.  Marland Kitchen History of bone density study 07/12/2018   Up coming appointment 07/17/2019, Per [facility] New Patient Packet  . History of bone density study 07/17/2019  . History of endoscopy 12/17/2015   Needed Every 3 years. Scheduled for January 2021. Per [facility] New Patient Packet.   Marland Kitchen History of mammogram 04/14/2019   Per [place] Patient Packet.  Marland Kitchen History of MRI 03/01/2019   By Dr.Gulati/ Neurologist. MRI of Brain. Per [facility] New Patient Packet.  Marland Kitchen History of MRI 04/23/2019   By Dr. Maryan Rued at [facility]. Per [facility] New Patient Packet.  Marland Kitchen History of Papanicolaou  smear of cervix 10/16/2011   Per [place] Patient Packet  . Hypotension    Per [facility] New Patient Packet.  . IBS (irritable bowel syndrome)    Per [place] New Patient Packet.  . Lactose intolerance   . Osteoarthritis    Per [facility] New Patient Packet.  Marland Kitchen Osteopenia    Per [place] New Patient Packet.  . Osteoporosis   . Premature ventricular complex   . Raynaud's disease    Per [facility] New Patient Packet.  . Scoliosis    Per [place] New Patient Packet.    Past Surgical History:  Procedure Laterality Date  . ABDOMINAL HYSTERECTOMY  06/13/2011   By Dr[facility]Scherer at [facility]. Per [facility] New Patient Packet.  Marland Kitchen BREAST EXCISIONAL BIOPSY Left 30+ yrs ago  . COLONOSCOPY  11/23/2011   By Dr.McCune at [place] [facility]. Per [place] Patient Packet.  . COLONOSCOPY  08/26/2019  . MAMMOGRAM  04/14/2019  . pap smear  10/16/2011  . SIGMOIDOSCOPY  11/02/2014   Per [place] New Patient Packet  . TONSILLECTOMY  06/12/1948   Per [place] New Patient Packet.    There were no vitals filed for this visit.  Subjective Assessment - 10/01/19 1103    Subjective  Pt.  returns for first follow up tx.-she is uncertain if related to last visit but notes some increased lumbar region soreness after visit potentially associated with positioning in prone for portion of tx. She does note some ease of upper trapezius and right thoracic tension from previous status.    Pertinent History  osteoporosis, chronic back/neck pain, scoliosis, Raynaud's, shingles with recent flare up prior to symptom onset, IBS    Limitations  House hold activities;Lifting;Standing;Walking    Diagnostic tests  Brain MRI    Patient Stated Goals  Get rid of pain    Currently in Pain?  No/denies                       [facility] Adult PT Treatment/Exercise - 10/01/19 0001      Exercises   Exercises  Neck      Neck Exercises: Theraband   Rows  15 reps;Red      Neck Exercises: Seated   Neck Retraction Limitations  HEP instruction and brief  practice cervical retractions      Manual Therapy   Manual Therapy  Soft tissue mobilization    Soft tissue mobilization  STM bilateral upper trapezius and levator region in sitting      Neck Exercises: Stretches   Upper Trapezius Stretch  Right;Left;2 reps;20 seconds    Levator Stretch  Right;Left;2 reps;20 seconds    Other Neck Stretches  HEP instruction doorway pec stretch       Trigger Point Dry Needling - 10/01/19 0001    Consent Given?  Yes    Muscles Treated Head and Neck  Upper trapezius;Levator scapulae    Muscles Treated Back/Hip  Erector spinae   right thoracic longissimus at T7 region   Dry Needling Comments  needling to upper trapezius/levator region in sitting with 32 gauge 30 mm needles, right thoracic longissimus needled in sidelying with 32 gauge 30 mm needle with "shelf" technique with transverse insertion    Electrical Stimulation Performed with Dry Needling  Yes    E-stim with Dry Needling Details  TENS 20 pps x 10 min in sitting to upper trapezius/levator region    Upper Trapezius Response  Twitch reponse elicited           PT Education - 10/01/19 1122    Education Details  FOTO patient report, dry needling, exercises/HEP instruction    Person(s) Educated  Patient    Methods  Explanation;Demonstration;Verbal cues;Handout    Comprehension  Returned demonstration;Verbalized understanding          PT Long Term Goals - 09/26/19 1505      PT LONG TERM GOAL #1   Title  Independent with HEP    Baseline  needs HEP    Time  6    Period  Weeks    Status  New    Target Date  11/07/19      PT LONG TERM GOAL #2   Title  Perform light lifting for chores and arm/shoulder ROM for ADLs without limitation due to neck or back pain    Baseline  limited due to right thoracic region pain    Time  6    Period  Weeks    Status  New    Target Date  11/07/19      PT LONG TERM GOAL #3   Title  Perform walking exercise and be able to participate in community  exercise classes such as Tai Chi without limitation due to neck or back pain  Baseline  reports tried Tai Chi class but had to stop due to thoracic pain    Time  6    Period  Weeks    Status  New    Target Date  11/07/19      PT LONG TERM GOAL #4   Title  FOTO outcome measure score 42% or less impairment    Time  6    Period  Weeks    Status  New    Target Date  11/07/19      PT LONG TERM GOAL #5   Title  Right cervical AROM = left to improve ability to turn head while driving    Baseline  65    Time  6    Period  Weeks    Status  New    Target Date  11/07/19            Plan - 10/01/19 1124    Clinical Impression Statement  Avoided prone positioning today due to back pain noted after last session. Instructed/added HEP for gentle stretches and postural exercises to address myofascial tension and performed STM as well as another trial dry needling to affected muscles. So far responding well to tx. for decreased pain/associated muscle tension-expect progress to be gradual given underlying postural contribution to symptoms.    Personal Factors and Comorbidities  Age;Comorbidity 3+    Comorbidities  scoliosis, chronic spine pain, osteoporosis    Examination-Activity Limitations  Lift;Reach Overhead;Locomotion Level    Examination-Participation Restrictions  Community Activity    Stability/Clinical Decision Making  Evolving/Moderate complexity    Clinical Decision Making  Moderate    Rehab Potential  Good    PT Frequency  --   1-2x/week   PT Duration  6 weeks    PT Treatment/Interventions  ADLs/Self Care Home Management;Ultrasound;Electrical Stimulation;Moist Heat;Therapeutic activities;Therapeutic exercise;Neuromuscular re-education;Patient/family education;Manual techniques;Dry needling;Taping    PT Next Visit Plan  caution:osteoporosis-no traction or spinal mobs, no cryo due to Raynaud's, continue dry needling, STM/myofascial release-suboccipital release, upper trap and  right>left thoracic paraspinal region, stretches and postural retraining as tolerated    PT Home Exercise Plan  [id number]: gentle upper trapezius and levator stretches, cervical retractions, doorway pec stretch, Theraband row    Consulted and Agree with Plan of Care  Patient       Patient will benefit from skilled therapeutic intervention in order to improve the following deficits and impairments:  Pain, Postural dysfunction, Impaired flexibility, Decreased activity tolerance, Increased muscle spasms, Difficulty walking, Increased fascial restricitons  Visit Diagnosis: Cervicalgia  Abnormal posture     Problem List Patient Active Problem List   Diagnosis Date Noted  . Occipital neuralgia of right side 09/08/2019  . Recurrent UTI 06/26/2019  . Osteoporosis 06/26/2019  . IBS (irritable bowel syndrome) 06/26/2019  . Hyperlipidemia 06/26/2019  . Back pain 06/26/2019  . Barrett's esophagus 06/26/2019    Beaulah Dinning, PT, DPT 10/01/19 11:31 AM  [facility] [address] [place], [place], [address] Phone: [phone]   Fax:  [phone]  Name: Carol Perez MRN: [id number] Date of Birth: 1941-04-18

## 2019-10-03 ENCOUNTER — Encounter: Payer: Self-pay | Admitting: Physical Therapy

## 2019-10-08 ENCOUNTER — Other Ambulatory Visit: Payer: Self-pay

## 2019-10-08 ENCOUNTER — Ambulatory Visit: Payer: Medicare Other | Admitting: Physical Therapy

## 2019-10-08 ENCOUNTER — Encounter: Payer: Self-pay | Admitting: Physical Therapy

## 2019-10-08 DIAGNOSIS — R293 Abnormal posture: Secondary | ICD-10-CM

## 2019-10-08 DIAGNOSIS — M542 Cervicalgia: Secondary | ICD-10-CM | POA: Diagnosis not present

## 2019-10-08 NOTE — Therapy (Signed)
[place], [place], [address] Phone: [phone]   Fax:  [phone]  Physical Therapy Treatment  Patient Details  Name: Carol Perez MRN: [id number] Date of Birth: Nov 09, 1940 Referring Provider (PT): Sarina Ill, MD   Encounter Date: 10/08/2019  PT End of Session - 10/08/19 1539    Visit Number  3    Number of Visits  12    Date for PT Re-Evaluation  11/07/19    Authorization Type  UHC Medicare    PT Start Time  1500    PT Stop Time  1545    PT Time Calculation (min)  45 min    Activity Tolerance  Patient tolerated treatment well    Behavior During Therapy  [facility] for tasks assessed/performed       Past Medical History:  Diagnosis Date  . Barrett's esophagus   . Colitis   . DDD (degenerative disc disease)   . Erroneous encounter - disregard    error   . Erroneous encounter - disregard   . Erroneous encounter - disregard   . Factor 5 Leiden mutation, heterozygous [facility])    Per [place] Patient Packet.  . Frequent UTI   . H/O echocardiogram 04/01/2019   Per [place] Patient Packet.  . Hiatal hernia    Per [place] Patient Packet.  . High grade dysplasia of Barrett's epithelium    Per [facility] New Patient Packet.  Marland Kitchen History of bladder infections    Per [facility] New Patient Packet.  Marland Kitchen History of bone density study 07/12/2018   Per [place] Patient Packet.  Marland Kitchen History of bone density study 07/12/2018   Up coming appointment 07/17/2019, Per [facility] New Patient Packet  . History of bone density study 07/17/2019  . History of endoscopy 12/17/2015   Needed Every 3 years. Scheduled for January 2021. Per [facility] New Patient Packet.   Marland Kitchen History of mammogram 04/14/2019   Per [place] Patient Packet.  Marland Kitchen History of MRI 03/01/2019   By Dr.Gulati/ Neurologist. MRI of Brain. Per [facility] New Patient Packet.  Marland Kitchen History of MRI 04/23/2019   By Dr[facility]an [facility]ued at [facility]. Per [facility] New Patient Packet.  Marland Kitchen History of Papanicolaou  smear of cervix 10/16/2011   Per [place] Patient Packet  . Hypotension    Per [facility] New Patient Packet.  . IBS (irritable bowel syndrome)    Per [place] New Patient Packet.  . Lactose intolerance   . Osteoarthritis    Per [facility] New Patient Packet.  Marland Kitchen Osteopenia    Per [place] New Patient Packet.  . Osteoporosis   . Premature ventricular complex   . Raynaud's disease    Per [facility] New Patient Packet.  . Scoliosis    Per [place] New Patient Packet.    Past Surgical History:  Procedure Laterality Date  . ABDOMINAL HYSTERECTOMY  06/13/2011   By Dr.Scherer at [facility]. Per [facility] New Patient Packet.  Marland Kitchen BREAST EXCISIONAL BIOPSY Left 30+ yrs ago  . COLONOSCOPY  11/23/2011   By Dr.McCune at [place] [facility]. Per [place] Patient Packet.  . COLONOSCOPY  08/26/2019  . MAMMOGRAM  04/14/2019  . pap smear  10/16/2011  . SIGMOIDOSCOPY  11/02/2014   Per [place] New Patient Packet  . TONSILLECTOMY  06/12/1948   Per [place] New Patient Packet.    There were no vitals filed for this visit.  Subjective Assessment - 10/08/19 1503    Subjective  Pt.  reports did better after tx. at eval (tx. in prone) vs. last session (tried in sitting due to c/o increased back pain after tx. at eval). No neck pain today, has noted continued intermittent right thoracic pain (no pain immediately pre-tx.). She reports also referred for PT for pelvic floor due to some urinary stress incontinence issues. Though still wtih some intermittent back pain she reports status still improved from baseline.    Pertinent History  osteoporosis, chronic back/neck pain, scoliosis, Raynaud's, shingles with recent flare up prior to symptom onset, IBS    Currently in Pain?  No/denies                       [facility] Adult PT Treatment/Exercise - 10/08/19 0001      Neck Exercises: Theraband   Shoulder Extension  20 reps;Green    Rows  20 reps;Green    Horizontal ABduction  15 reps;Red    Horizontal ABduction Limitations  supine       Manual Therapy   Manual therapy comments  suboccipital release    Soft tissue mobilization  right thoracic paraspinals      Neck Exercises: Stretches   Upper Trapezius Stretch  Right;Left;2 reps;20 seconds    Levator Stretch  Right;Left;2 reps;20 seconds       Trigger Point Dry Needling - 10/08/19 0001    Consent Given?  Yes    Muscles Treated Head and Neck  Upper trapezius    Muscles Treated Back/Hip  Erector spinae   right thoracic longissimus at T8-9 level   Dry Needling Comments  needling in prone over 2 pillows    Electrical Stimulation Performed with Dry Needling  Yes    E-stim with Dry Needling Details  TENS 20 pps x 10 min in sitting to upper trapezius/levator region           PT Education - 10/08/19 1539    Education Details  POC    Person(s) Educated  Patient    Methods  Explanation    Comprehension  Verbalized understanding          PT Long Term Goals - 09/26/19 1505      PT LONG TERM GOAL #1   Title  Independent with HEP    Baseline  needs HEP    Time  6    Period  Weeks    Status  New    Target Date  11/07/19      PT LONG TERM GOAL #2   Title  Perform light lifting for chores and arm/shoulder ROM for ADLs without limitation due to neck or back pain    Baseline  limited due to right thoracic region pain    Time  6    Period  Weeks    Status  New    Target Date  11/07/19      PT LONG TERM GOAL #3   Title  Perform walking exercise and be able to participate in community exercise classes such as Tai Chi without limitation due to neck or back pain    Baseline  reports tried Tai Chi class but had to stop due to thoracic pain    Time  6    Period  Weeks    Status  New    Target Date  11/07/19      PT LONG TERM GOAL #4   Title  FOTO outcome measure score 42% or less impairment    Time  6  Period  Weeks    Status  New    Target Date  11/07/19      PT LONG TERM GOAL #5   Title  Right cervical AROM = left to improve ability to turn head  while driving    Baseline  65    Time  6    Period  Weeks    Status  New    Target Date  11/07/19            Plan - 10/08/19 1539    Clinical Impression Statement  Resumed prone positioning for manual tx. and dry needling per pt. report of more benefit in this position. Good progress for neck/occipital pain and fair progress for back but still improving from baseline and given multi-year history of pain this region expect progress may be gradual.    Personal Factors and Comorbidities  Age;Comorbidity 3+    Comorbidities  scoliosis, chronic spine pain, osteoporosis    Examination-Activity Limitations  Lift;Reach Overhead;Locomotion Level    Examination-Participation Restrictions  Community Activity    Stability/Clinical Decision Making  Evolving/Moderate complexity    Clinical Decision Making  Moderate    Rehab Potential  Good    PT Frequency  --   1-2x/week   PT Duration  6 weeks    PT Treatment/Interventions  ADLs/Self Care Home Management;Ultrasound;Electrical Stimulation;Moist Heat;Therapeutic activities;Therapeutic exercise;Neuromuscular re-education;Patient/family education;Manual techniques;Dry needling;Taping    PT Next Visit Plan  caution:osteoporosis-no traction or spinal mobs, no cryo due to Raynaud's, continue dry needling, STM/myofascial release-suboccipital release, upper trap and right>left thoracic paraspinal region, stretches and postural retraining as tolerated    PT Home Exercise Plan  [id number]: gentle upper trapezius and levator stretches, cervical retractions, doorway pec stretch, Theraband row    Consulted and Agree with Plan of Care  Patient       Patient will benefit from skilled therapeutic intervention in order to improve the following deficits and impairments:  Pain, Postural dysfunction, Impaired flexibility, Decreased activity tolerance, Increased muscle spasms, Difficulty walking, Increased fascial restricitons  Visit Diagnosis: Cervicalgia  Abnormal  posture     Problem List Patient Active Problem List   Diagnosis Date Noted  . Occipital neuralgia of right side 09/08/2019  . Recurrent UTI 06/26/2019  . Osteoporosis 06/26/2019  . IBS (irritable bowel syndrome) 06/26/2019  . Hyperlipidemia 06/26/2019  . Back pain 06/26/2019  . Barrett's esophagus 06/26/2019    Beaulah Dinning, PT, DPT 10/08/19 3:42 PM   [facility] [address] [place], [place], [address] Phone: [phone]   Fax:  [phone]  Name: Carol Perez MRN: [id number] Date of Birth: 1940-08-24

## 2019-10-09 ENCOUNTER — Encounter: Payer: Self-pay | Admitting: Physical Therapy

## 2019-10-09 ENCOUNTER — Ambulatory Visit: Payer: Medicare Other | Admitting: Physical Therapy

## 2019-10-09 DIAGNOSIS — M542 Cervicalgia: Secondary | ICD-10-CM

## 2019-10-09 DIAGNOSIS — R293 Abnormal posture: Secondary | ICD-10-CM

## 2019-10-09 NOTE — Therapy (Signed)
[place], [place], [address] Phone: [phone]   Fax:  [phone]  Physical Therapy Treatment  Patient Details  Name: Carol Perez MRN: [id number] Date of Birth: Sep 01, 1940 Referring Provider (PT): Sarina Ill, MD   Encounter Date: 10/09/2019  PT End of Session - 10/09/19 1407    Visit Number  4    Number of Visits  12    Date for PT Re-Evaluation  11/07/19    Authorization Type  UHC Medicare    PT Start Time  8676    PT Stop Time  1413    PT Time Calculation (min)  45 min    Activity Tolerance  Patient tolerated treatment well    Behavior During Therapy  [facility] for tasks assessed/performed       Past Medical History:  Diagnosis Date  . Barrett's esophagus   . Colitis   . DDD (degenerative disc disease)   . Erroneous encounter - disregard    error   . Erroneous encounter - disregard   . Erroneous encounter - disregard   . Factor 5 Leiden mutation, heterozygous [facility])    Per [place] Patient Packet.  . Frequent UTI   . H/O echocardiogram 04/01/2019   Per [place] Patient Packet.  . Hiatal hernia    Per [place] Patient Packet.  . High grade dysplasia of Barrett's epithelium    Per [facility] New Patient Packet.  Marland Kitchen History of bladder infections    Per [facility] New Patient Packet.  Marland Kitchen History of bone density study 07/12/2018   Per [place] Patient Packet.  Marland Kitchen History of bone density study 07/12/2018   Up coming appointment 07/17/2019, Per [facility] New Patient Packet  . History of bone density study 07/17/2019  . History of endoscopy 12/17/2015   Needed Every 3 years. Scheduled for January 2021. Per [facility] New Patient Packet.   Marland Kitchen History of mammogram 04/14/2019   Per [place] Patient Packet.  Marland Kitchen History of MRI 03/01/2019   By Dr.Gulati/ Neurologist. MRI of Brain. Per [facility] New Patient Packet.  Marland Kitchen History of MRI 04/23/2019   By Dr[facility] Maryan [facility]ued at [facility]. Per [facility] New Patient Packet.  Marland Kitchen History of Papanicolaou  smear of cervix 10/16/2011   Per [place] Patient Packet  . Hypotension    Per [facility] New Patient Packet.  . IBS (irritable bowel syndrome)    Per [place] New Patient Packet.  . Lactose intolerance   . Osteoarthritis    Per [facility] New Patient Packet.  Marland Kitchen Osteopenia    Per [place] New Patient Packet.  . Osteoporosis   . Premature ventricular complex   . Raynaud's disease    Per [facility] New Patient Packet.  . Scoliosis    Per [place] New Patient Packet.    Past Surgical History:  Procedure Laterality Date  . ABDOMINAL HYSTERECTOMY  06/13/2011   By Dr[facility]r at [facility]. Per [facility] New Patient Packet.  Marland Kitchen BREAST EXCISIONAL BIOPSY Left 30+ yrs ago  . COLONOSCOPY  11/23/2011   By Dr.McCune at [facility]. Per [place] Patient Packet.  . COLONOSCOPY  08/26/2019  . MAMMOGRAM  04/14/2019  . pap smear  10/16/2011  . SIGMOIDOSCOPY  11/02/2014   Per [place] New Patient Packet  . TONSILLECTOMY  06/12/1948   Per [place] New Patient Packet.    There were no vitals filed for this visit.  Subjective Assessment - 10/09/19 1329    Subjective  No  significant soreness after yesterday and able to take walk this AM without any pain.    Pertinent History  osteoporosis, chronic back/neck pain, scoliosis, Raynaud's, shingles with recent flare up prior to symptom onset, IBS    Currently in Pain?  No/denies                       [facility] Adult PT Treatment/Exercise - 10/09/19 0001      Neck Exercises: Supine   Neck Retraction  15 reps      Manual Therapy   Manual therapy comments  suboccipital release      Neck Exercises: Stretches   Upper Trapezius Stretch  Right;Left;2 reps;20 seconds    Levator Stretch  Right;Left;2 reps;20 seconds    Other Neck Stretches  left trunk stretch in right sidelying with shoulder abduction 20 sec x 3, also brief HEP instruction seated sidebending trunk stretch       Trigger Point Dry Needling - 10/09/19 0001    Consent Given?  Yes    Muscles Treated  Head and Neck  Upper trapezius    Muscles Treated Back/Hip  Erector spinae   right thoracic longissimus at T8-9 level   Dry Needling Comments  needling in prone over 2 pillows    Electrical Stimulation Performed with Dry Needling  Yes    E-stim with Dry Needling Details  TENS 20 pps x 10 min in sitting to upper trapezius/levator region           PT Education - 10/09/19 1407    Education Details  POC, posture    Person(s) Educated  Patient    Methods  Explanation    Comprehension  Verbalized understanding          PT Long Term Goals - 09/26/19 1505      PT LONG TERM GOAL #1   Title  Independent with HEP    Baseline  needs HEP    Time  6    Period  Weeks    Status  New    Target Date  11/07/19      PT LONG TERM GOAL #2   Title  Perform light lifting for chores and arm/shoulder ROM for ADLs without limitation due to neck or back pain    Baseline  limited due to right thoracic region pain    Time  6    Period  Weeks    Status  New    Target Date  11/07/19      PT LONG TERM GOAL #3   Title  Perform walking exercise and be able to participate in community exercise classes such as Tai Chi without limitation due to neck or back pain    Baseline  reports tried Tai Chi class but had to stop due to thoracic pain    Time  6    Period  Weeks    Status  New    Target Date  11/07/19      PT LONG TERM GOAL #4   Title  FOTO outcome measure score 42% or less impairment    Time  6    Period  Weeks    Status  New    Target Date  11/07/19      PT LONG TERM GOAL #5   Title  Right cervical AROM = left to improve ability to turn head while driving    Baseline  65    Time  6    Period  Weeks  Status  New    Target Date  11/07/19            Plan - 10/09/19 1407    Clinical Impression Statement  Good response to tx. for decreased pain and improved tolerance for walking with less pain. Plan continue PT through visits next week but if progress continues then tentative  d.c vs. put POC on hold to try continuing with HEP for a few weeks.    Personal Factors and Comorbidities  Age;Comorbidity 3+    Comorbidities  scoliosis, chronic spine pain, osteoporosis    Examination-Activity Limitations  Lift;Reach Overhead;Locomotion Level    Examination-Participation Restrictions  Community Activity    Stability/Clinical Decision Making  Evolving/Moderate complexity    Clinical Decision Making  Moderate    Rehab Potential  Good    PT Frequency  --   1-2x/week   PT Duration  6 weeks    PT Treatment/Interventions  ADLs/Self Care Home Management;Ultrasound;Electrical Stimulation;Moist Heat;Therapeutic activities;Therapeutic exercise;Neuromuscular re-education;Patient/family education;Manual techniques;Dry needling;Taping    PT Next Visit Plan  caution:osteoporosis-no traction or spinal mobs, no cryo due to Raynaud's, continue dry needling, STM/myofascial release-suboccipital release, upper trap and right>left thoracic paraspinal region, stretches and postural retraining as tolerated    PT Home Exercise Plan  [id number]: gentle upper trapezius and levator stretches, cervical retractions, doorway pec stretch, Theraband row    Consulted and Agree with Plan of Care  Patient       Patient will benefit from skilled therapeutic intervention in order to improve the following deficits and impairments:  Pain, Postural dysfunction, Impaired flexibility, Decreased activity tolerance, Increased muscle spasms, Difficulty walking, Increased fascial restricitons  Visit Diagnosis: Cervicalgia  Abnormal posture     Problem List Patient Active Problem List   Diagnosis Date Noted  . Occipital neuralgia of right side 09/08/2019  . Recurrent UTI 06/26/2019  . Osteoporosis 06/26/2019  . IBS (irritable bowel syndrome) 06/26/2019  . Hyperlipidemia 06/26/2019  . Back pain 06/26/2019  . Barrett's esophagus 06/26/2019    Beaulah Dinning, PT, DPT 10/09/19 2:11 PM  [facility] [address] [place], [place], [address] Phone: [phone]   Fax:  [phone]  Name: Carol Perez MRN: [id number] Date of Birth: August 27, 1940

## 2019-10-13 ENCOUNTER — Other Ambulatory Visit: Payer: Self-pay

## 2019-10-13 ENCOUNTER — Ambulatory Visit: Payer: Medicare Other | Attending: Neurology | Admitting: Physical Therapy

## 2019-10-13 ENCOUNTER — Encounter: Payer: Self-pay | Admitting: Physical Therapy

## 2019-10-13 DIAGNOSIS — M542 Cervicalgia: Secondary | ICD-10-CM | POA: Insufficient documentation

## 2019-10-13 DIAGNOSIS — R293 Abnormal posture: Secondary | ICD-10-CM | POA: Diagnosis present

## 2019-10-13 NOTE — Therapy (Signed)
[place], [place], [address] Phone: [phone]   Fax:  [phone]  Physical Therapy Treatment  Patient Details  Name: Carol Perez MRN: [id number] Date of Birth: Feb 10, 1941 Referring Provider (PT): Sarina Ill, MD   Encounter Date: 10/13/2019  PT End of Session - 10/13/19 1137    Visit Number  5    Number of Visits  12    Date for PT Re-Evaluation  11/07/19    Authorization Type  UHC Medicare    PT Start Time  1057    PT Stop Time  1145    PT Time Calculation (min)  48 min    Activity Tolerance  Patient tolerated treatment well    Behavior During Therapy  [facility] for tasks assessed/performed       Past Medical History:  Diagnosis Date  . Barrett's esophagus   . Colitis   . DDD (degenerative disc disease)   . Erroneous encounter - disregard    error   . Erroneous encounter - disregard   . Erroneous encounter - disregard   . Factor 5 Leiden mutation, heterozygous [facility])    Per [place] Patient Packet.  . Frequent UTI   . H/O echocardiogram 04/01/2019   Per [place] Patient Packet.  . Hiatal hernia    Per [place] Patient Packet.  . High grade dysplasia of Barrett's epithelium    Per [facility] New Patient Packet.  Marland Kitchen History of bladder infections    Per [facility] New Patient Packet.  Marland Kitchen History of bone density study 07/12/2018   Per [place] Patient Packet.  Marland Kitchen History of bone density study 07/12/2018   Up coming appointment 07/17/2019, Per [facility] New Patient Packet  . History of bone density study 07/17/2019  . History of endoscopy 12/17/2015   Needed Every 3 years. Scheduled for January 2021. Per [facility] New Patient Packet.   Marland Kitchen History of mammogram 04/14/2019   Per [place] Patient Packet.  Marland Kitchen History of MRI 03/01/2019   By Dr.Gulati/ Neurologist. MRI of Brain. Per [facility] New Patient Packet.  Marland Kitchen History of MRI 04/23/2019   By Dr[facility]an [facility]ued at [facility]. Per [facility] New Patient Packet.  Marland Kitchen History of Papanicolaou  smear of cervix 10/16/2011   Per [place] Patient Packet  . Hypotension    Per [facility] New Patient Packet.  . IBS (irritable bowel syndrome)    Per [place] New Patient Packet.  . Lactose intolerance   . Osteoarthritis    Per [facility] New Patient Packet.  Marland Kitchen Osteopenia    Per [place] New Patient Packet.  . Osteoporosis   . Premature ventricular complex   . Raynaud's disease    Per [facility] New Patient Packet.  . Scoliosis    Per [place] New Patient Packet.    Past Surgical History:  Procedure Laterality Date  . ABDOMINAL HYSTERECTOMY  06/13/2011   By [facility]Scherer at [facility]. Per [facility] New Patient Packet.  Marland Kitchen BREAST EXCISIONAL BIOPSY Left 30+ yrs ago  . COLONOSCOPY  11/23/2011   By Dr.McCune at [facility]. Per [place] Patient Packet.  . COLONOSCOPY  08/26/2019  . MAMMOGRAM  04/14/2019  . pap smear  10/16/2011  . SIGMOIDOSCOPY  11/02/2014   Per [place] New Patient Packet  . TONSILLECTOMY  06/12/1948   Per [place] New Patient Packet.    There were no vitals filed for this visit.  Subjective Assessment - 10/13/19 1058    Subjective  Pt.  reports able to take walk this AM without pain and no significant sorenes or symptoms since last visit though dealing with leg cramps at night-she reports suspects this is associated with side effect for steroid medication she is taking.    Pertinent History  osteoporosis, chronic back/neck pain, scoliosis, Raynaud's, shingles with recent flare up prior to symptom onset, IBS    Diagnostic tests  Brain MRI    Patient Stated Goals  Get rid of pain    Currently in Pain?  No/denies         [facility] PT Assessment - 10/13/19 0001      Observation/Other Assessments   Focus on Therapeutic Outcomes (FOTO)   31% limited      AROM   Cervical - Right Rotation  60    Cervical - Left Rotation  65                   [facility] Adult PT Treatment/Exercise - 10/13/19 0001      Neck Exercises: Standing   Other Standing Exercises  HEP review-Theraband  exercises      Manual Therapy   Manual therapy comments  suboccipital release    Soft tissue mobilization  right thoracic paraspinals      Neck Exercises: Stretches   Upper Trapezius Stretch  Right;Left;2 reps;20 seconds    Levator Stretch  Right;Left;2 reps;20 seconds       Trigger Point Dry Needling - 10/13/19 0001    Consent Given?  Yes    Muscles Treated Head and Neck  Upper trapezius   bilat.   Muscles Treated Back/Hip  Erector spinae   right thoracic longissimus at T8-9 level   Dry Needling Comments  needling in prone over 2 pillows    Electrical Stimulation Performed with Dry Needling  Yes    E-stim with Dry Needling Details  TENS 20 pps x 10 min in sitting to upper trapezius/levator region           PT Education - 10/13/19 1136    Education Details  POC, HEP, Theracane use vs. tennis ball for self trigger point release    Person(s) Educated  Patient    Methods  Explanation;Demonstration;Verbal cues    Comprehension  Verbalized understanding          PT Long Term Goals - 10/13/19 1139      PT LONG TERM GOAL #1   Title  Independent with HEP    Baseline  met    Time  6    Period  Weeks    Status  Achieved      PT LONG TERM GOAL #2   Title  Perform light lifting for chores and arm/shoulder ROM for ADLs without limitation due to neck or back pain    Baseline  still some issues with chronic LBP but no limitations for neck or thoracic pain addressed with PT    Time  6    Period  Weeks    Status  Achieved      PT LONG TERM GOAL #3   Title  Perform walking exercise and be able to participate in community exercise classes such as Tai Chi without limitation due to neck or back pain    Baseline  walks without pain now, had not yet attempted group exercise    Time  6    Period  Weeks    Status  Partially Met      PT LONG TERM GOAL #4   Title  FOTO outcome measure score 42% or less impairment    Baseline  31% limited    Time  6    Period  Weeks    Status   Achieved      PT LONG TERM GOAL #5   Title  Right cervical AROM = left to improve ability to turn head while driving    Baseline  right 60 deg, left 65 deg    Time  6    Period  Weeks    Status  On-going            Plan - 10/13/19 1137    Clinical Impression Statement  Pt. has progressed well with therapy with no current pain complaints-given progress remaining visits cancelled and she will try continuining via HEP with return prn in the next few weeks only if needed if pain exacerbation experienced otherwise plan d/c to HEP.    Personal Factors and Comorbidities  Age;Comorbidity 3+    Comorbidities  scoliosis, chronic spine pain, osteoporosis    Examination-Activity Limitations  Lift;Reach Overhead;Locomotion Level    Examination-Participation Restrictions  Community Activity    Stability/Clinical Decision Making  Evolving/Moderate complexity    Clinical Decision Making  Moderate    Rehab Potential  Good    PT Frequency  --   1-2x/week   PT Duration  6 weeks    PT Treatment/Interventions  ADLs/Self Care Home Management;Ultrasound;Electrical Stimulation;Moist Heat;Therapeutic activities;Therapeutic exercise;Neuromuscular re-education;Patient/family education;Manual techniques;Dry needling;Taping    PT Next Visit Plan  return only if needed otherwise d/c    PT Home Exercise Plan  [id number]: gentle upper trapezius and levator stretches, cervical retractions, doorway pec stretch, Theraband row    Consulted and Agree with Plan of Care  Patient       Patient will benefit from skilled therapeutic intervention in order to improve the following deficits and impairments:  Pain, Postural dysfunction, Impaired flexibility, Decreased activity tolerance, Increased muscle spasms, Difficulty walking, Increased fascial restricitons  Visit Diagnosis: Cervicalgia  Abnormal posture     Problem List Patient Active Problem List   Diagnosis Date Noted  . Occipital neuralgia of right side  09/08/2019  . Recurrent UTI 06/26/2019  . Osteoporosis 06/26/2019  . IBS (irritable bowel syndrome) 06/26/2019  . Hyperlipidemia 06/26/2019  . Back pain 06/26/2019  . Barrett's esophagus 06/26/2019    Beaulah Dinning, PT, DPT 10/13/19 11:58 AM  [facility] [address] [place], [place], [address] Phone: [phone]   Fax:  [phone]  Name: YTZEL GUBLER MRN: [id number] Date of Birth: January 15, 1941

## 2019-10-16 ENCOUNTER — Ambulatory Visit: Payer: Medicare Other | Admitting: Physical Therapy

## 2019-10-20 ENCOUNTER — Encounter: Payer: Medicare Other | Admitting: Physical Therapy

## 2019-10-22 ENCOUNTER — Encounter: Payer: Medicare Other | Admitting: Physical Therapy

## 2019-10-31 ENCOUNTER — Ambulatory Visit: Payer: Medicare Other

## 2019-11-07 ENCOUNTER — Ambulatory Visit: Payer: Medicare Other | Admitting: *Deleted

## 2019-11-07 ENCOUNTER — Other Ambulatory Visit: Payer: Self-pay

## 2019-11-07 DIAGNOSIS — M810 Age-related osteoporosis without current pathological fracture: Secondary | ICD-10-CM

## 2019-11-07 MED ORDER — DENOSUMAB 60 MG/ML ~~LOC~~ SOSY
60.0000 mg | PREFILLED_SYRINGE | Freq: Once | SUBCUTANEOUS | Status: AC
  Administered 2019-11-07: 60 mg via SUBCUTANEOUS

## 2019-11-12 ENCOUNTER — Other Ambulatory Visit: Payer: Self-pay

## 2019-11-12 ENCOUNTER — Non-Acute Institutional Stay: Payer: Medicare Other | Admitting: Internal Medicine

## 2019-11-12 ENCOUNTER — Encounter: Payer: Self-pay | Admitting: Internal Medicine

## 2019-11-12 VITALS — BP 120/68 | HR 79 | Temp 98.1°F | Ht 61.5 in | Wt 132.4 lb

## 2019-11-12 DIAGNOSIS — E785 Hyperlipidemia, unspecified: Secondary | ICD-10-CM

## 2019-11-12 DIAGNOSIS — A6000 Herpesviral infection of urogenital system, unspecified: Secondary | ICD-10-CM

## 2019-11-12 DIAGNOSIS — M545 Low back pain, unspecified: Secondary | ICD-10-CM

## 2019-11-12 DIAGNOSIS — M810 Age-related osteoporosis without current pathological fracture: Secondary | ICD-10-CM | POA: Diagnosis not present

## 2019-11-12 DIAGNOSIS — M542 Cervicalgia: Secondary | ICD-10-CM

## 2019-11-12 DIAGNOSIS — G8929 Other chronic pain: Secondary | ICD-10-CM

## 2019-11-12 DIAGNOSIS — R252 Cramp and spasm: Secondary | ICD-10-CM

## 2019-11-12 DIAGNOSIS — N390 Urinary tract infection, site not specified: Secondary | ICD-10-CM

## 2019-11-12 DIAGNOSIS — F419 Anxiety disorder, unspecified: Secondary | ICD-10-CM

## 2019-11-12 DIAGNOSIS — K52832 Lymphocytic colitis: Secondary | ICD-10-CM | POA: Diagnosis not present

## 2019-11-12 MED ORDER — PNEUMOCOCCAL VAC POLYVALENT 25 MCG/0.5ML IJ INJ: 0.5000 mL | INJECTION | Freq: Once | INTRAMUSCULAR | 0 refills | Status: AC

## 2019-11-12 MED ORDER — TETANUS-DIPHTH-ACELL PERTUSSIS 5-2.5-18.5 LF-MCG/0.5 IM SUSP: 0.5000 mL | Freq: Once | INTRAMUSCULAR | 0 refills | Status: AC

## 2019-11-12 MED ORDER — VALACYCLOVIR HCL 500 MG PO TABS: 500.0000 mg | ORAL_TABLET | Freq: Every day | ORAL | 0 refills | Status: DC | PRN

## 2019-11-12 NOTE — Progress Notes (Signed)
Location:  [place] of Service:  Clinic (12)  Provider:   Code Status:  Goals of Care:  Advanced Directives 09/26/2019  Does Patient Have a Medical Advance Directive? Yes  Type of Paramedic of [place];Living will  Does patient want to make changes to medical advance directive? No - Patient declined  Copy of Downs in Chart? No - copy requested  Would patient like information on creating a medical advance directive? -     Chief Complaint  Patient presents with  . Medical Management of Chronic Issues  . Health Maintenance    PNA, TDAP    HPI: Patient is a 79 y.o. female seen today for medical management of chronic diseases.    Has h/o Recurrent UTI, Genital Herpes, Osteoporosis, IBS with Diarrhea, Barrett's Esophagus, Hyperlipidemia ans Back Pain  Recent Diagnosis Cervicalgia Patient was diagnosed when she went to the emergency room with a severe pain in the back of her neck.  Was seen by Dr. Lavell Anchors.  She had physical therapy and was treated with Neurontin.  She says she is much better and back to her baseline.  She is not taking Neurontin anymore Muscle cramps This seems to be a new problem.  She thinks is because of budesonide which was started by GI for lymphocytic colitis.  She is taking magnesium almost 1000 mg at night to see if that will help Recent diagnosis of colitis lymphocytic and was started on budesonide 3 times a day..  Patient started having cramps in her leg.  For now she is taking it just once a day. She says she is still having diarrhea. Anxiety This continues to be an issue.  ? with depression  Other stable issues  Patient has these  episodes in which she start having Nausea, Palpitations, Dizziness,SOBand Confusion. It can last from 5 min to 30 min Seen By Neurology Her MRI and EEG have been negative She has been seen by Cardiologist Her Echo Normal. Zio Patch was negative for any  Arrhythmias except Some PVC She says thisstartedwhen she moved with her husband to friend's home. They had to sell the house in [place]. There is some amount of anxiety involved neck Is taking three 81 mg Aspirin And was started on Statin but did not tolerate  Recurrent UTI Patient has a history of recurrent UTI She is on chronic Methanimine Osteoporosis Is on Prolia  Has moved in [facility] in June 2020 from [place]. She has a son who lives in [place] and a niece who is the [place]  Past Medical History:  Diagnosis Date  . Barrett's esophagus   . Colitis   . DDD (degenerative disc disease)   . Erroneous encounter - disregard    error   . Erroneous encounter - disregard   . Erroneous encounter - disregard   . Factor 5 Leiden mutation, heterozygous [facility])    Per [place] Patient Packet.  . Frequent UTI   . H/O echocardiogram 04/01/2019   Per [place] Patient Packet.  . Hiatal hernia    Per [place] Patient Packet.  . High grade dysplasia of Barrett's epithelium    Per [facility] New Patient Packet.  Marland Kitchen History of bladder infections    Per [facility] New Patient Packet.  Marland Kitchen History of bone density study 07/12/2018   Per [place] Patient Packet.  Marland Kitchen History of bone density study 07/12/2018   Up coming appointment 07/17/2019, Per [facility] New  Patient Packet  . History of bone density study 07/17/2019  . History of endoscopy 12/17/2015   Needed Every 3 years. Scheduled for January 2021. Per [facility] New Patient Packet.   Marland Kitchen History of mammogram 04/14/2019   Per [place] Patient Packet.  Marland Kitchen History of MRI 03/01/2019   By Dr.Gulati/ Neurologist. MRI of Brain. Per [facility] New Patient Packet.  Marland Kitchen History of MRI 04/23/2019   By Dr. Maryan Rued at [facility]. Per [facility] New Patient Packet.  Marland Kitchen History of Papanicolaou smear of cervix 10/16/2011   Per [place] Patient Packet  . Hypotension    Per [facility] New Patient Packet.  . IBS (irritable bowel syndrome)    Per [place] New Patient Packet.  . Lactose  intolerance   . Osteoarthritis    Per [facility] New Patient Packet.  Marland Kitchen Osteopenia    Per [place] New Patient Packet.  . Osteoporosis   . Premature ventricular complex   . Raynaud's disease    Per [facility] New Patient Packet.  . Scoliosis    Per [place] New Patient Packet.    Past Surgical History:  Procedure Laterality Date  . ABDOMINAL HYSTERECTOMY  06/13/2011   By Dr.Scherer at [facility]. Per [facility] New Patient Packet.  Marland Kitchen BREAST EXCISIONAL BIOPSY Left 30+ yrs ago  . COLONOSCOPY  11/23/2011   By Dr.McCune at [facility]. Per [place] Patient Packet.  . COLONOSCOPY  08/26/2019  . MAMMOGRAM  04/14/2019  . pap smear  10/16/2011  . SIGMOIDOSCOPY  11/02/2014   Per [place] New Patient Packet  . TONSILLECTOMY  06/12/1948   Per [place] New Patient Packet.    Allergies  Allergen Reactions  . Aspirin Other (See Comments)    Upsets ulcers  . Avelox [Moxifloxacin]   . Banana     Swelling around mouth and eyes and red blotches  . Ciprofloxacin Hcl   . Levofloxacin   . Norfloxacin   . Nsaids Other (See Comments)    Upsets ulcers  . Ofloxacin   . Other     FLOXIN  . Penicillins Hives  . Tape   . Tequin [Gatifloxacin]   . Latex Swelling and Rash  . Methylisothiazolinone Rash    Outpatient Encounter Medications as of 11/12/2019  Medication Sig  . aspirin EC 81 MG tablet Take 81 mg by mouth 3 (three) times daily.   . B Complex Vitamins (VITAMIN B-COMPLEX) TABS Take 1 tablet by mouth daily. With vitamin C  . Biotin 5000 MCG TABS Take 1 tablet by mouth daily.  . budesonide (ENTOCORT EC) 3 MG 24 hr capsule Take 3 mg by mouth daily.   Marland Kitchen CALCIUM PO Take 1,200 mg by mouth daily.   . Cholecalciferol (D3-1000 PO) Take by mouth daily. 2,000 IU  . conjugated estrogens (PREMARIN) vaginal cream Place 1 Applicatorful vaginally 3 (three) times a week.  . denosumab (PROLIA) 60 MG/ML SOSY injection Inject 60 mg into the skin every 6 (six) months. Last injection 05/21  . gabapentin (NEURONTIN) 100  MG capsule Take 100 mg by mouth daily as needed for pain.  Marland Kitchen gabapentin (NEURONTIN) 100 MG capsule Take 1 capsule (100 mg total) by mouth 3 (three) times daily as needed.  . Glucosamine HCl 1000 MG TABS Take 1 tablet by mouth 2 (two) times daily.  Marland Kitchen Lifitegrast (XIIDRA OP) Apply 1 drop to eye 2 (two) times daily.   Marland Kitchen loperamide (IMODIUM) 2 MG capsule Take 2 mg by mouth as needed for diarrhea or loose  stools.  . magnesium oxide (MAG-OX) 400 MG tablet Take 400 mg by mouth daily.  . methenamine (HIPREX) 1 g tablet Take 1 g by mouth 2 (two) times daily with a meal. For UTI Prevention. Last UTI: October 20th, November 5th, and December 28th.   . Methenamine-Sodium Salicylate (CYSTEX PO) Take 100 mg by mouth daily. ( Cranberry Concentrate )  . Multiple Vitamins-Minerals (ONE-A-DAY WOMENS PO) Take 1 tablet by mouth daily.  . NON FORMULARY CBD Full spectrum 1 ml as needed for sleep.  Marland Kitchen Omeprazole-Sodium Bicarbonate (ZEGERID PO) Take 20 mg by mouth 2 (two) times daily.  . pneumococcal 23 valent vaccine (PNEUMOVAX-23) 25 MCG/0.5ML injection Inject 0.5 mLs into the muscle once for 1 dose.  . Polyvinyl Alcohol-Povidone (REFRESH OP) Apply 1 drop to eye as needed.  . Probiotic Product (PROBIOTIC COLON SUPPORT PO) Take 1 tablet by mouth daily.  . Tdap (BOOSTRIX) 5-2.5-18.5 LF-MCG/0.5 injection Inject 0.5 mLs into the muscle once for 1 dose.  . Turmeric (QC TUMERIC COMPLEX) 500 MG CAPS Take 1 capsule by mouth daily.  . valACYclovir (VALTREX) 500 MG tablet Take 1 tablet (500 mg total) by mouth daily as needed (flareup). (Patient taking differently: Take 500 mg by mouth daily. )  . [DISCONTINUED] gabapentin (NEURONTIN) 300 MG capsule Take 1 capsule (300 mg total) by mouth 3 (three) times daily. (Patient taking differently: Take 300 mg by mouth at bedtime. )  . [DISCONTINUED] pneumococcal 23 valent vaccine (PNEUMOVAX-23) 25 MCG/0.5ML injection Inject 0.5 mLs into the muscle once.  . [DISCONTINUED] Tdap (BOOSTRIX)  5-2.5-18.5 LF-MCG/0.5 injection Inject 0.5 mLs into the muscle once.  . [DISCONTINUED] valACYclovir (VALTREX) 500 MG tablet Take 500 mg by mouth daily as needed (flareup).  . Multiple Vitamins-Minerals (PRESERVISION AREDS 2 PO) Take 1 tablet by mouth daily.   No facility-administered encounter medications on file as of 11/12/2019.    Review of Systems:  Review of Systems  Constitutional: Positive for activity change.  HENT: Negative.   Respiratory: Negative.   Cardiovascular: Negative.   Gastrointestinal: Positive for diarrhea.  Genitourinary: Negative.   Musculoskeletal: Negative.   Skin: Negative.   Neurological: Negative.   Psychiatric/Behavioral: Positive for sleep disturbance. The patient is nervous/anxious.     Health Maintenance  Topic Date Due  . PNA vac Low Risk Adult (2 of 2 - PPSV23) 12/12/2015  . TETANUS/TDAP  04/08/2018  . INFLUENZA VACCINE  01/11/2020  . DEXA SCAN  Completed  . COVID-19 Vaccine  Completed    Physical Exam: Vitals:   11/12/19 1523  BP: 120/68  Pulse: 79  Temp: 98.1 F (36.7 C)  SpO2: 97%  Weight: 132 lb 6.4 oz (60.1 kg)  Height: 5' 1.5" (1.562 m)   Body mass index is 24.61 kg/m. Physical Exam Vitals reviewed.  Constitutional:      Appearance: Normal appearance.  HENT:     Head: Normocephalic.     Nose: Nose normal.     Mouth/Throat:     Mouth: Mucous membranes are moist.     Pharynx: Oropharynx is clear.  Eyes:     Pupils: Pupils are equal, round, and reactive to light.  Cardiovascular:     Rate and Rhythm: Normal rate and regular rhythm.     Pulses: Normal pulses.  Pulmonary:     Effort: Pulmonary effort is normal.     Breath sounds: Normal breath sounds.  Abdominal:     General: Abdomen is flat. Bowel sounds are normal.     Palpations: Abdomen is soft.  Musculoskeletal:        General: No swelling.     Cervical back: Neck supple.  Skin:    General: Skin is warm.  Neurological:     General: No focal deficit present.      Mental Status: She is alert and oriented to person, place, and time.  Psychiatric:        Mood and Affect: Mood normal.     Labs reviewed: Basic Metabolic Panel: Recent Labs    04/23/19 1250 08/29/19 0826 09/02/19 1016  NA 134* 141 136  K 4.2 4.2 4.2  CL 102 104 100  CO2 25 27 27   GLUCOSE 100* 102* 100*  BUN 9 10 12   CREATININE 0.86 0.76 0.74  CALCIUM 8.4* 9.6 8.8*   Liver Function Tests: Recent Labs    08/29/19 0826  AST 22  ALT 17  BILITOT 0.5  PROT 6.8   No results for input(s): LIPASE, AMYLASE in the last 8760 hours. No results for input(s): AMMONIA in the last 8760 hours. CBC: Recent Labs    04/23/19 1250 08/29/19 0826 09/02/19 1016  WBC 9.0 6.3 7.0  NEUTROABS 5.3 3,484 4.4  HGB 12.2 13.8 13.5  HCT 38.5 41.8 42.1  MCV 101.0* 95.2 99.8  PLT 251 374 307   Lipid Panel: Recent Labs    08/29/19 0826  CHOL 228*  HDL 93  LDLCALC 112*  TRIG 115  CHOLHDL 2.5   No results found for: HGBA1C  Procedures since last visit: No results found.  Assessment/Plan  Cervicalgia Diagnosed by neurology Now resolved Got  therapy Not taking Neurontin anymore  Lymphocytic colitis On budesonide daily Still having diarrhea Muscle cramps Was taking magnesium off told her to cut it back due  her diarrhea She can take Neurontin every night to help her cramps  Age-related osteoporosis without current pathological fracture Got her Prolia shot in May 21 Recurrent UTI On Mithenimine Hyperlipidemia, unspecified hyperlipidemia type Was started on Lipitor by her at the PCP she states she did not tolerate it Chronic bilateral low back pain without sciatica Takes Neurontin as needed Anxiety We discussed about starting antidepressant Patient states she had acute depression many years ago is open to any medication Will reevaluate in 3 months Genital herpes simplex, unspecified site Valacyclovir ? TIA like symptoms Patient has not had any more symptoms like that  recently and it looked more like anxiety Has been taking aspirin 3 tablets every day I told her to start taking 1 is there is no need for such a high dose anymore  Labs/tests ordered:  * No order type specified * Next appt:  Visit date not found

## 2019-11-18 NOTE — Therapy (Signed)
[place], [place], [address] Phone: [phone]   Fax:  [phone]  Physical Therapy Treatment/Discharge  Patient Details  Name: Carol Perez MRN: [id number] Date of Birth: 1940-08-17 Referring Provider (PT): Sarina Ill, MD   Encounter Date: 10/13/2019    Past Medical History:  Diagnosis Date  . Barrett's esophagus   . Colitis   . DDD (degenerative disc disease)   . Erroneous encounter - disregard    error   . Erroneous encounter - disregard   . Erroneous encounter - disregard   . Factor 5 Leiden mutation, heterozygous [facility])    Per [place] Patient Packet.  . Frequent UTI   . H/O echocardiogram 04/01/2019   Per [place] Patient Packet.  . Hiatal hernia    Per [place] Patient Packet.  . High grade dysplasia of Barrett's epithelium    Per [facility] New Patient Packet.  Marland Kitchen History of bladder infections    Per [facility] New Patient Packet.  Marland Kitchen History of bone density study 07/12/2018   Per [place] Patient Packet.  Marland Kitchen History of bone density study 07/12/2018   Up coming appointment 07/17/2019, Per [facility] New Patient Packet  . History of bone density study 07/17/2019  . History of endoscopy 12/17/2015   Needed Every 3 years. Scheduled for January 2021. Per [facility] New Patient Packet.   Marland Kitchen History of mammogram 04/14/2019   Per [place] Patient Packet.  Marland Kitchen History of MRI 03/01/2019   By Dr.Gulati/ Neurologist. MRI of Brain. Per [facility] New Patient Packet.  Marland Kitchen History of MRI 04/23/2019   By Dr. Maryan Rued at [facility]. Per [facility] New Patient Packet.  Marland Kitchen History of Papanicolaou smear of cervix 10/16/2011   Per [place] Patient Packet  . Hypotension    Per [facility] New Patient Packet.  . IBS (irritable bowel syndrome)    Per [place] New Patient Packet.  . Lactose intolerance   . Osteoarthritis    Per [facility] New Patient Packet.  Marland Kitchen Osteopenia    Per [place] New Patient Packet.  . Osteoporosis   . Premature ventricular complex   .  Raynaud's disease    Per [facility] New Patient Packet.  . Scoliosis    Per [place] New Patient Packet.    Past Surgical History:  Procedure Laterality Date  . ABDOMINAL HYSTERECTOMY  06/13/2011   By Dr.Scherer at [facility]. Per [facility] New Patient Packet.  Marland Kitchen BREAST EXCISIONAL BIOPSY Left 30+ yrs ago  . COLONOSCOPY  11/23/2011   By Dr.McCune at [facility]. Per [place] Patient Packet.  . COLONOSCOPY  08/26/2019  . MAMMOGRAM  04/14/2019  . pap smear  10/16/2011  . SIGMOIDOSCOPY  11/02/2014   Per [place] New Patient Packet  . TONSILLECTOMY  06/12/1948   Per [place] New Patient Packet.    There were no vitals filed for this visit.                                  PT Long Term Goals - 10/13/19 1139      PT LONG TERM GOAL #1   Title  Independent with HEP    Baseline  met    Time  6    Period  Weeks    Status  Achieved      PT LONG TERM GOAL #2   Title  Perform light lifting for chores and arm/shoulder  ROM for ADLs without limitation due to neck or back pain    Baseline  still some issues with chronic LBP but no limitations for neck or thoracic pain addressed with PT    Time  6    Period  Weeks    Status  Achieved      PT LONG TERM GOAL #3   Title  Perform walking exercise and be able to participate in community exercise classes such as Tai Chi without limitation due to neck or back pain    Baseline  walks without pain now, had not yet attempted group exercise    Time  6    Period  Weeks    Status  Partially Met      PT LONG TERM GOAL #4   Title  FOTO outcome measure score 42% or less impairment    Baseline  31% limited    Time  6    Period  Weeks    Status  Achieved      PT LONG TERM GOAL #5   Title  Right cervical AROM = left to improve ability to turn head while driving    Baseline  right 60 deg, left 65 deg    Time  6    Period  Weeks    Status  On-going              Patient will benefit from skilled therapeutic  intervention in order to improve the following deficits and impairments:  Pain, Postural dysfunction, Impaired flexibility, Decreased activity tolerance, Increased muscle spasms, Difficulty walking, Increased fascial restricitons  Visit Diagnosis: Cervicalgia  Abnormal posture     Problem List Patient Active Problem List   Diagnosis Date Noted  . Occipital neuralgia of right side 09/08/2019  . Recurrent UTI 06/26/2019  . Osteoporosis 06/26/2019  . IBS (irritable bowel syndrome) 06/26/2019  . Hyperlipidemia 06/26/2019  . Back pain 06/26/2019  . Barrett's esophagus 06/26/2019        PHYSICAL THERAPY DISCHARGE SUMMARY  Visits from Start of Care: 5  Current functional level related to goals / functional outcomes: Patient did not return for further therapy after last session 10/13/19. At the time she was doing well without significant symptoms-plan of care was left open in case of need for return if having symptom exacerbation but no further visits scheduled. Patient to follow up with MD if having any future changes in status.   Remaining deficits: NA   Education / Equipment: HEP Plan: Patient agrees to discharge.  Patient goals were partially met. Patient is being discharged due to meeting the stated rehab goals.  ?????           Beaulah Dinning, PT, DPT 11/18/19 8:32 AM     [facility] [address] [place], [place], [address] Phone: [phone]   Fax:  [phone]  Name: Carol Perez MRN: [id number] Date of Birth: 09-14-1940

## 2019-11-20 ENCOUNTER — Other Ambulatory Visit: Payer: Self-pay

## 2019-11-20 DIAGNOSIS — K52832 Lymphocytic colitis: Secondary | ICD-10-CM

## 2019-11-20 DIAGNOSIS — R252 Cramp and spasm: Secondary | ICD-10-CM

## 2019-11-21 LAB — COMPLETE METABOLIC PANEL WITH GFR
AG Ratio: 2 (calc) (ref 1.0–2.5)
ALT: 15 U/L (ref 6–29)
AST: 20 U/L (ref 10–35)
Albumin: 3.9 g/dL (ref 3.6–5.1)
Alkaline phosphatase (APISO): 65 U/L (ref 37–153)
BUN: 11 mg/dL (ref 7–25)
CO2: 29 mmol/L (ref 20–32)
Calcium: 9.5 mg/dL (ref 8.6–10.4)
Chloride: 103 mmol/L (ref 98–110)
Creat: 0.81 mg/dL (ref 0.60–0.93)
GFR, Est African American: 81 mL/min/{1.73_m2} (ref >=60)
GFR, Est Non African American: 70 mL/min/{1.73_m2} (ref >=60)
Globulin: 2 g/dL (calc) (ref 1.9–3.7)
Glucose, Bld: 82 mg/dL (ref 65–99)
Potassium: 3.9 mmol/L (ref 3.5–5.3)
Sodium: 142 mmol/L (ref 135–146)
Total Bilirubin: 0.4 mg/dL (ref 0.2–1.2)
Total Protein: 5.9 g/dL — ABNORMAL LOW (ref 6.1–8.1)

## 2019-11-21 LAB — CBC WITH DIFFERENTIAL/PLATELET
Absolute Monocytes: 595 cells/uL (ref 200–950)
Basophils Absolute: 93 cells/uL (ref 0–200)
Basophils Relative: 1.5 %
Eosinophils Absolute: 149 cells/uL (ref 15–500)
Eosinophils Relative: 2.4 %
HCT: 38.7 % (ref 35.0–45.0)
Hemoglobin: 12.9 g/dL (ref 11.7–15.5)
Lymphs Abs: 1941 cells/uL (ref 850–3900)
MCH: 33.1 pg — ABNORMAL HIGH (ref 27.0–33.0)
MCHC: 33.3 g/dL (ref 32.0–36.0)
MCV: 99.2 fL (ref 80.0–100.0)
MPV: 10 fL (ref 7.5–12.5)
Monocytes Relative: 9.6 %
Neutro Abs: 3422 cells/uL (ref 1500–7800)
Neutrophils Relative %: 55.2 %
Platelets: 270 10*3/uL (ref 140–400)
RBC: 3.9 10*6/uL (ref 3.80–5.10)
RDW: 12.8 % (ref 11.0–15.0)
Total Lymphocyte: 31.3 %
WBC: 6.2 10*3/uL (ref 3.8–10.8)

## 2019-11-21 LAB — VITAMIN B12: Vitamin B-12: 393 pg/mL (ref 200–1100)

## 2019-11-21 LAB — MAGNESIUM: Magnesium: 2.1 mg/dL (ref 1.5–2.5)

## 2019-11-21 LAB — TSH: TSH: 2.44 mIU/L (ref 0.40–4.50)

## 2019-12-06 ENCOUNTER — Other Ambulatory Visit: Payer: Self-pay | Admitting: Internal Medicine

## 2019-12-17 ENCOUNTER — Other Ambulatory Visit: Payer: Self-pay

## 2019-12-17 ENCOUNTER — Non-Acute Institutional Stay: Payer: Medicare Other | Admitting: Internal Medicine

## 2019-12-17 ENCOUNTER — Encounter: Payer: Self-pay | Admitting: Internal Medicine

## 2019-12-17 VITALS — BP 112/68 | HR 89 | Temp 98.0°F | Ht 61.5 in | Wt 139.8 lb

## 2019-12-17 DIAGNOSIS — K52832 Lymphocytic colitis: Secondary | ICD-10-CM

## 2019-12-17 DIAGNOSIS — M545 Low back pain, unspecified: Secondary | ICD-10-CM

## 2019-12-17 DIAGNOSIS — M79605 Pain in left leg: Secondary | ICD-10-CM

## 2019-12-17 DIAGNOSIS — G8929 Other chronic pain: Secondary | ICD-10-CM

## 2019-12-17 NOTE — Progress Notes (Signed)
Location: [place] of Service:  Clinic (12)  Provider:   Code Status:  Goals of Care:  Advanced Directives 09/26/2019  Does Patient Have a Medical Advance Directive? Yes  Type of Paramedic of [place];Living will  Does patient want to make changes to medical advance directive? No - Patient declined  Copy of [place] in Chart? No - copy requested  Would patient like information on creating a medical advance directive? -     Chief Complaint  Patient presents with  . Acute Visit    Patient returns to the clinic complaining of left leg pain for the last 3 weeks. She also has bakers cysts behind both knees.    HPI: Patient is a 79 y.o. female seen today for an acute visit for Left Leg Pain  Has h/o Recurrent UTI, Genital Herpes, Osteoporosis, IBS with Diarrhea, Barrett's Esophagus, Hyperlipidemia and Back Pain  Also Recent Diagnosis of Cervicalgia, Muscle Cramps, Lymphocytic Colitis  Left Leg Pain Seems to have started 3 weeks ago. She has started some new exercise to involving her legs. She thinks she also felt Bakers Cyst in that leg Walking with the cane now as she thinks her leg is not stable C/o Pain when she walks.  Low Back Pain She was told in [place] that she needs Surgery for bad back. She did not go through it. Having some pain occasionally more lower back. No Radiating to legs. No Bowel or Bladder control issues. Lymphocytic Colitis On Budesonide. Stopped herself. Has follow up with GI  In few weeks.    Her other issues are stable today Patient hastheseepisodes in which she start having Nausea, Palpitations, Dizziness,SOBand Confusion. It can last from 5 min to 30 min Seen By Neurology Her MRIand EEG have been negative She has been seen by Cardiologist Her Echo Normal. Zio Patch was negative for any Arrhythmias except Some PVC She says thisstartedwhen she moved with her husband to friend's  home. They had to sell the house in [place]. There is some amount of anxiety involved neck Is taking three 81 mg Aspirin And was started on Statin but did not tolerate  Recurrent UTI Patient has a history of recurrent UTI She is on chronicMethanimine Osteoporosis Is on Prolia Cervicalgia Taking Neurontin PRN Symptoms controlled  Has moved in [facility] in June 2020 from [place]. She has a son who lives in [place] and a niece who is the [place]  Past Medical History:  Diagnosis Date  . Barrett's esophagus   . Colitis   . DDD (degenerative disc disease)   . Erroneous encounter - disregard    error   . Erroneous encounter - disregard   . Erroneous encounter - disregard   . Factor 5 Leiden mutation, heterozygous [facility])    Per [place] Patient Packet.  . Frequent UTI   . H/O echocardiogram 04/01/2019   Per [place] Patient Packet.  . Hiatal hernia    Per [place] Patient Packet.  . High grade dysplasia of Barrett's epithelium    Per [facility] New Patient Packet.  Marland Kitchen History of bladder infections    Per [facility] New Patient Packet.  Marland Kitchen History of bone density study 07/12/2018   Per [place] Patient Packet.  Marland Kitchen History of bone density study 07/12/2018   Up coming appointment 07/17/2019, Per [facility] New Patient Packet  . History of bone density study 07/17/2019  . History of endoscopy 12/17/2015   Needed  Every 3 years. Scheduled for January 2021. Per [facility] New Patient Packet.   Marland Kitchen History of mammogram 04/14/2019   Per [place] Patient Packet.  Marland Kitchen History of MRI 03/01/2019   By Dr.Gulati/ Neurologist. MRI of Brain. Per [facility] New Patient Packet.  Marland Kitchen History of MRI 04/23/2019   By Dr. Maryan Rued at [facility]. Per [facility] New Patient Packet.  Marland Kitchen History of Papanicolaou smear of cervix 10/16/2011   Per [place] Patient Packet  . Hypotension    Per [facility] New Patient Packet.  . IBS (irritable bowel syndrome)    Per [place] New Patient Packet.  . Lactose intolerance   . Osteoarthritis    Per [facility] New  Patient Packet.  Marland Kitchen Osteopenia    Per [place] New Patient Packet.  . Osteoporosis   . Premature ventricular complex   . Raynaud's disease    Per [facility] New Patient Packet.  . Scoliosis    Per [place] New Patient Packet.    Past Surgical History:  Procedure Laterality Date  . ABDOMINAL HYSTERECTOMY  06/13/2011   By Dr[facility]Scherer at [facility]. Per [facility] New Patient Packet.  Marland Kitchen BREAST EXCISIONAL BIOPSY Left 30+ yrs ago  . COLONOSCOPY  11/23/2011   By Dr.McCune at [place] [facility]. Per [place] Patient Packet.  . COLONOSCOPY  08/26/2019  . MAMMOGRAM  04/14/2019  . pap smear  10/16/2011  . SIGMOIDOSCOPY  11/02/2014   Per [place] New Patient Packet  . TONSILLECTOMY  06/12/1948   Per [place] New Patient Packet.    Allergies  Allergen Reactions  . Aspirin Other (See Comments)    Upsets ulcers  . Avelox [Moxifloxacin]   . Banana     Swelling around mouth and eyes and red blotches  . Ciprofloxacin Hcl   . Levofloxacin   . Norfloxacin   . Nsaids Other (See Comments)    Upsets ulcers  . Ofloxacin   . Other     FLOXIN  . Penicillins Hives  . Tape   . Tequin [Gatifloxacin]   . Latex Swelling and Rash  . Methylisothiazolinone Rash    Outpatient Encounter Medications as of 12/17/2019  Medication Sig  . aspirin EC 81 MG tablet Take 81 mg by mouth 3 (three) times daily.   . B Complex Vitamins (VITAMIN B-COMPLEX) TABS Take 1 tablet by mouth daily. With vitamin C  . Biotin 5000 MCG TABS Take 1 tablet by mouth daily.  Marland Kitchen CALCIUM PO Take 1,200 mg by mouth daily.   . Cholecalciferol (D3-1000 PO) Take by mouth daily. 2,000 IU  . conjugated estrogens (PREMARIN) vaginal cream Place 1 Applicatorful vaginally 3 (three) times a week.  . denosumab (PROLIA) 60 MG/ML SOSY injection Inject 60 mg into the skin every 6 (six) months. Last injection 05/21  . gabapentin (NEURONTIN) 100 MG capsule Take 100 mg by mouth daily as needed for pain.  Marland Kitchen gabapentin (NEURONTIN) 100 MG capsule Take 1 capsule (100 mg  total) by mouth 3 (three) times daily as needed.  . Glucosamine HCl 1000 MG TABS Take 1 tablet by mouth 2 (two) times daily.  Marland Kitchen Lifitegrast (XIIDRA OP) Apply 1 drop to eye 2 (two) times daily.   Marland Kitchen loperamide (IMODIUM) 2 MG capsule Take 2 mg by mouth as needed for diarrhea or loose stools.  . magnesium oxide (MAG-OX) 400 MG tablet Take 400 mg by mouth daily.  . methenamine (HIPREX) 1 g tablet Take 1 g by mouth 2 (two) times daily with a meal. For UTI  Prevention. Last UTI: October 20th, November 5th, and December 28th.   . Methenamine-Sodium Salicylate (CYSTEX PO) Take 100 mg by mouth daily. ( Cranberry Concentrate )  . Multiple Vitamins-Minerals (ONE-A-DAY WOMENS PO) Take 1 tablet by mouth daily.  . Multiple Vitamins-Minerals (PRESERVISION AREDS 2 PO) Take 1 tablet by mouth daily.  . NON FORMULARY CBD Full spectrum 1 ml as needed for sleep.  Marland Kitchen Omeprazole-Sodium Bicarbonate (ZEGERID PO) Take 20 mg by mouth 2 (two) times daily.  . Polyvinyl Alcohol-Povidone (REFRESH OP) Apply 1 drop to eye as needed.  . Probiotic Product (PROBIOTIC COLON SUPPORT PO) Take 1 tablet by mouth daily.  . Turmeric (QC TUMERIC COMPLEX) 500 MG CAPS Take 1 capsule by mouth daily.  . valACYclovir (VALTREX) 500 MG tablet TAKE (1) TABLET DAILY AS NEEDED.  . [DISCONTINUED] budesonide (ENTOCORT EC) 3 MG 24 hr capsule Take 3 mg by mouth daily.    No facility-administered encounter medications on file as of 12/17/2019.    Review of Systems:  Review of Systems  Constitutional: Positive for activity change.  HENT: Negative.   Respiratory: Negative.   Cardiovascular: Negative.   Gastrointestinal: Negative.   Genitourinary: Negative.   Musculoskeletal: Positive for arthralgias, back pain, gait problem and myalgias.  Skin: Negative.   Neurological: Positive for weakness.  Psychiatric/Behavioral: The patient is nervous/anxious.     Health Maintenance  Topic Date Due  . Hepatitis C Screening  Never done  . PNA vac Low Risk  Adult (2 of 2 - PPSV23) 12/12/2015  . TETANUS/TDAP  04/08/2018  . INFLUENZA VACCINE  01/11/2020  . DEXA SCAN  Completed  . COVID-19 Vaccine  Completed    Physical Exam: Vitals:   12/17/19 1255  BP: 112/68  Pulse: 89  Temp: 98 F (36.7 C)  SpO2: 99%  Weight: 139 lb 12.8 oz (63.4 kg)  Height: 5' 1.5" (1.562 m)   Body mass index is 25.99 kg/m. Physical Exam Vitals reviewed.  Constitutional:      Appearance: Normal appearance.  HENT:     Head: Normocephalic.     Nose: Nose normal.     Mouth/Throat:     Mouth: Mucous membranes are moist.     Pharynx: Oropharynx is clear.  Eyes:     Pupils: Pupils are equal, round, and reactive to light.  Cardiovascular:     Rate and Rhythm: Normal rate.     Pulses: Normal pulses.  Pulmonary:     Effort: Pulmonary effort is normal.     Breath sounds: Normal breath sounds.  Abdominal:     General: Abdomen is flat. Bowel sounds are normal.     Palpations: Abdomen is soft.  Musculoskeletal:     Cervical back: Neck supple.     Comments: Left Leg Exam Normal. Good pulses. No Swelling Knee exam was normal also with No signs of arthritis and I did not feel any cyst Right LE was normal also  Skin:    General: Skin is warm.  Neurological:     General: No focal deficit present.     Mental Status: She is alert and oriented to person, place, and time.     Comments: Walking with Slight limp and using her cane  Psychiatric:        Mood and Affect: Mood normal.        Thought Content: Thought content normal.     Labs reviewed: Basic Metabolic Panel: Recent Labs    08/29/19 0826 09/02/19 1016 11/19/19 0929  NA 141 136  142  K 4.2 4.2 3.9  CL 104 100 103  CO2 27 27 29   GLUCOSE 102* 100* 82  BUN 10 12 11   CREATININE 0.76 0.74 0.81  CALCIUM 9.6 8.8* 9.5  MG  --   --  2.1  TSH  --   --  2.44   Liver Function Tests: Recent Labs    08/29/19 0826 11/19/19 0929  AST 22 20  ALT 17 15  BILITOT 0.5 0.4  PROT 6.8 5.9*   No results  for input(s): LIPASE, AMYLASE in the last 8760 hours. No results for input(s): AMMONIA in the last 8760 hours. CBC: Recent Labs    08/29/19 0826 09/02/19 1016 11/19/19 0929  WBC 6.3 7.0 6.2  NEUTROABS 3,484 4.4 3,422  HGB 13.8 13.5 12.9  HCT 41.8 42.1 38.7  MCV 95.2 99.8 99.2  PLT 374 307 270   Lipid Panel: Recent Labs    08/29/19 0826  CHOL 228*  HDL 93  LDLCALC 112*  TRIG 115  CHOLHDL 2.5   No results found for: HGBA1C  Procedures since last visit: No results found.  Assessment/Plan Left leg pain ? Muscle pain. No Signs of Infection or injury Continue Tylenol PRN Stop the new exercise Continue walking Reval in 2 weeks. If Pain Not better will consider Xray on the Ankle  Chronic bilateral low back pain without sciatica Patient wanted to know if she should see neurosurgery She says she was told that she needs surgery when she was in [place]. Right now her pain seems to be controlled and mostly localized. No other symptoms besides occasional pain I told her that we can discuss this later if her pain got worse. Right now continue Neurontin PRN Lymphocytic colitis Patient is supposed to be on budesonide but she states she is having a lot of side effects due to that She is going to see her GI and discussed with him At this time she has stopped taking it  Other Issues  Cervicalgia Diagnosed by neurology Now resolved Got  therapy  Muscle cramps She can take Neurontin every night to help her cramps  Age-related osteoporosis without current pathological fracture Got her Prolia shot in May 21 Recurrent UTI On Mithenimine Hyperlipidemia, unspecified hyperlipidemia type Was started on Lipitor by her previous PCP she states she did not tolerate it Anxiety We discussed about starting antidepressant Patient states she had acute depression many years ago is open to any medication Will reevaluate in 3 months Genital herpes simplex, unspecified  site Valacyclovir ? TIA like symptoms Patient has not had any more symptoms like that recently and it looked more like anxiety Has been taking aspirin 3 tablets every day I told her to start taking 1 is there is no need for such a high dose anymore     Labs/tests ordered:  * No order type specified * Next appt:  02/11/2020 Total time spent in this patient care encounter was  45_  minutes; greater than 50% of the visit spent counseling patient and staff, reviewing records , Labs and coordinating care for problems addressed at this encounter.

## 2019-12-27 ENCOUNTER — Encounter: Payer: Self-pay | Admitting: Internal Medicine

## 2019-12-29 ENCOUNTER — Other Ambulatory Visit: Payer: Self-pay | Admitting: Internal Medicine

## 2019-12-29 DIAGNOSIS — G8929 Other chronic pain: Secondary | ICD-10-CM

## 2019-12-29 DIAGNOSIS — M545 Low back pain, unspecified: Secondary | ICD-10-CM

## 2019-12-29 DIAGNOSIS — M79605 Pain in left leg: Secondary | ICD-10-CM

## 2020-01-06 ENCOUNTER — Other Ambulatory Visit: Payer: Self-pay | Admitting: Internal Medicine

## 2020-01-06 NOTE — Telephone Encounter (Signed)
Not sure if this is suppose to be a long term medication.  I will sent to Virgie Dad, MD to approve

## 2020-02-07 ENCOUNTER — Other Ambulatory Visit: Payer: Self-pay | Admitting: Nurse Practitioner

## 2020-02-11 ENCOUNTER — Non-Acute Institutional Stay: Payer: Medicare Other | Admitting: Internal Medicine

## 2020-02-11 ENCOUNTER — Encounter: Payer: Self-pay | Admitting: Internal Medicine

## 2020-02-11 ENCOUNTER — Other Ambulatory Visit: Payer: Self-pay

## 2020-02-11 VITALS — BP 116/64 | HR 61 | Temp 98.1°F | Ht 61.5 in | Wt 139.8 lb

## 2020-02-11 DIAGNOSIS — M545 Low back pain, unspecified: Secondary | ICD-10-CM

## 2020-02-11 DIAGNOSIS — E785 Hyperlipidemia, unspecified: Secondary | ICD-10-CM

## 2020-02-11 DIAGNOSIS — M810 Age-related osteoporosis without current pathological fracture: Secondary | ICD-10-CM

## 2020-02-11 DIAGNOSIS — K52832 Lymphocytic colitis: Secondary | ICD-10-CM

## 2020-02-11 DIAGNOSIS — M79605 Pain in left leg: Secondary | ICD-10-CM

## 2020-02-11 DIAGNOSIS — A6000 Herpesviral infection of urogenital system, unspecified: Secondary | ICD-10-CM

## 2020-02-11 DIAGNOSIS — N390 Urinary tract infection, site not specified: Secondary | ICD-10-CM

## 2020-02-11 DIAGNOSIS — F419 Anxiety disorder, unspecified: Secondary | ICD-10-CM

## 2020-02-11 DIAGNOSIS — G8929 Other chronic pain: Secondary | ICD-10-CM

## 2020-02-11 MED ORDER — TRAZODONE HCL 50 MG PO TABS: 25.0000 mg | ORAL_TABLET | Freq: Every evening | ORAL | 2 refills | Status: DC | PRN

## 2020-02-11 NOTE — Progress Notes (Signed)
Location: [place] of Service:  Clinic (12)  Provider:   Code Status:  Goals of Care:  Advanced Directives 09/26/2019  Does Patient Have a Medical Advance Directive? Yes  Type of Paramedic of [place];Living will  Does patient want to make changes to medical advance directive? No - Patient declined  Copy of [place] in Chart? No - copy requested  Would patient like information on creating a medical advance directive? -     Chief Complaint  Patient presents with  . Medical Management of Chronic Issues    Patient returns to the clinic for 3 month follow up. No MOST/DNR  . Health Maintenance    Hep C screen, PPSV23, TDAP, influenza vaccine    HPI: Patient is a 79 y.o. female seen today for an acute visit with number of Issues  Left Leg Pain with Weakness Continues to be her issue. Feels weak in that leg and it hurts especially after she takes her walk with her husband. Has Appointment with Neurosurgery in 2 weeks  She says she feels her Balance is off due to this weakness Recurent UTI Self treated herself with ? Sulfa Drug. Was seen by Urology. They said her Urine is clear now. On Prophylaxis with Hiprex  Had ? Low grade temp with Cough over the weekend but now better. Has had 2 Covid shoots.  Also Dealing with Depression especially having issues with her Husband  Thinks that she sometimes get Hypoglycemic and has been trying Supplement sugar especially after exercise  Stopped Budesonide due to side effects. Has diagnosis of Lymphocytic Colitis    ? Anxiety Attack vs TIA  Patient has been having episodes in which she start having Nausea, Palpitations, Dizziness ,SOB and Confusion. It can last from 5 min to 30 min Seen By Neurology Her Mri and EEG have been negative She has been seen by Cardiologist  Her Echo Normal. Zio Patch was negative for any Arrhythmias except Some PVC She recently had one more  Episode recently when she got weak and had Shaking. She thinks it was due to her BS being Low Has moved in [facility] in [facility] from [place].  She has a son who lives in [place] and a niece who is the [place]  Past Medical History:  Diagnosis Date  . Barrett's esophagus   . Colitis   . DDD (degenerative disc disease)   . Erroneous encounter - disregard    error   . Erroneous encounter - disregard   . Erroneous encounter - disregard   . Factor 5 Leiden mutation, heterozygous [facility])    Per [place] Patient Packet.  . Frequent UTI   . H/O echocardiogram 04/01/2019   Per [place] Patient Packet.  . Hiatal hernia    Per [place] Patient Packet.  . High grade dysplasia of Barrett's epithelium    Per [facility] New Patient Packet.  Marland Kitchen History of bladder infections    Per [facility] New Patient Packet.  Marland Kitchen History of bone density study 07/12/2018   Per [place] Patient Packet.  Marland Kitchen History of bone density study 07/12/2018   Up coming appointment 07/17/2019, Per [facility] New Patient Packet  . History of bone density study 07/17/2019  . History of endoscopy 12/17/2015   Needed Every 3 years. Scheduled for January 2021. Per [facility] New Patient Packet.   Marland Kitchen History of mammogram 04/14/2019   Per [place] Patient Packet.  Marland Kitchen History  of MRI 03/01/2019   By Dr.Gulati/ Neurologist. MRI of Brain. Per [facility] New Patient Packet.  Marland Kitchen History of MRI 04/23/2019   [facility] Dr[facility]an [facility]ued at [facility]. Per [facility] New Patient Packet.  Marland Kitchen History of Papanicolaou smear of cervix 10/16/2011   Per [place] Patient Packet  . Hypotension    Per [facility] New Patient Packet.  . IBS (irritable bowel syndrome)    Per [place] New Patient Packet.  . Lactose intolerance   . Osteoarthritis    Per [facility] New Patient Packet.  Marland Kitchen Osteopenia    Per [place] New Patient Packet.  . Osteoporosis   . Premature ventricular complex   . Raynaud's disease    Per [facility] New Patient Packet.  . Scoliosis    Per [place] New Patient Packet.    Past Surgical History:   Procedure Laterality Date  . ABDOMINAL HYSTERECTOMY  06/13/2011   By Dr[facility]Scherer at [facility]. Per [facility] New Patient Packet.  Marland Kitchen BREAST EXCISIONAL BIOPSY Left 30+ yrs ago  . COLONOSCOPY  11/23/2011   By Dr.McCune at [facility]. Per [place] Patient Packet.  . COLONOSCOPY  08/26/2019  . MAMMOGRAM  04/14/2019  . pap smear  10/16/2011  . SIGMOIDOSCOPY  11/02/2014   Per [place] New Patient Packet  . TONSILLECTOMY  06/12/1948   Per [place] New Patient Packet.    Allergies  Allergen Reactions  . Aspirin Other (See Comments)    Upsets ulcers  . Avelox [Moxifloxacin]   . Banana     Swelling around mouth and eyes and red blotches  . Ciprofloxacin Hcl   . Levofloxacin   . Norfloxacin   . Nsaids Other (See Comments)    Upsets ulcers  . Ofloxacin   . Other     FLOXIN  . Penicillins Hives  . Tape   . Tequin [Gatifloxacin]   . Latex Swelling and Rash  . Methylisothiazolinone Rash    Outpatient Encounter Medications as of 02/11/2020  Medication Sig  . aspirin EC 81 MG tablet Take 81 mg by mouth 3 (three) times daily.   . B Complex Vitamins (VITAMIN B-COMPLEX) TABS Take 1 tablet by mouth daily. With vitamin C  . Biotin 5000 MCG TABS Take 1 tablet by mouth daily.  Marland Kitchen CALCIUM PO Take 1,200 mg by mouth daily.   . Cholecalciferol (D3-1000 PO) Take by mouth daily. 2,000 IU  . conjugated estrogens (PREMARIN) vaginal cream Place 1 Applicatorful vaginally 3 (three) times a week.  . denosumab (PROLIA) 60 MG/ML SOSY injection Inject 60 mg into the skin every 6 (six) months. Last injection 05/21  . gabapentin (NEURONTIN) 100 MG capsule Take 100 mg by mouth daily as needed for pain.  Marland Kitchen gabapentin (NEURONTIN) 100 MG capsule Take 1 capsule (100 mg total) by mouth 3 (three) times daily as needed.  . Glucosamine HCl 1000 MG TABS Take 1 tablet by mouth 2 (two) times daily.  Marland Kitchen Lifitegrast (XIIDRA OP) Apply 1 drop to eye 2 (two) times daily.   Marland Kitchen loperamide (IMODIUM) 2 MG capsule Take 2 mg by  mouth as needed for diarrhea or loose stools.  . magnesium oxide (MAG-OX) 400 MG tablet Take 400 mg by mouth daily.  . methenamine (HIPREX) 1 g tablet Take 1 g by mouth 2 (two) times daily with a meal. For UTI Prevention. Last UTI: October 20th, November 5th, and December 28th.   . Methenamine-Sodium Salicylate (CYSTEX PO) Take 100 mg by mouth daily. ( Cranberry Concentrate )  .  Multiple Vitamins-Minerals (ONE-A-DAY WOMENS PO) Take 1 tablet by mouth daily.  . Multiple Vitamins-Minerals (PRESERVISION AREDS 2 PO) Take 1 tablet by mouth daily.  . NON FORMULARY CBD Full spectrum 1 ml as needed for sleep.  Marland Kitchen Omeprazole-Sodium Bicarbonate (ZEGERID PO) Take 20 mg by mouth 2 (two) times daily.  . Polyvinyl Alcohol-Povidone (REFRESH OP) Apply 1 drop to eye as needed.  . Probiotic Product (PROBIOTIC COLON SUPPORT PO) Take 1 tablet by mouth daily.  . Turmeric (QC TUMERIC COMPLEX) 500 MG CAPS Take 1 capsule by mouth daily.  . valACYclovir (VALTREX) 500 MG tablet TAKE (1) TABLET DAILY AS NEEDED.   No facility-administered encounter medications on file as of 02/11/2020.    Review of Systems:  Review of Systems  Constitutional: Positive for activity change.  HENT: Negative.   Respiratory: Positive for cough.   Cardiovascular: Negative.   Gastrointestinal: Negative.   Genitourinary: Negative.   Musculoskeletal: Positive for arthralgias, back pain, gait problem and myalgias.  Skin: Negative.   Neurological: Positive for dizziness and weakness.  Psychiatric/Behavioral: Positive for dysphoric mood and sleep disturbance.    Health Maintenance  Topic Date Due  . Hepatitis C Screening  Never done  . PNA vac Low Risk Adult (2 of 2 - PPSV23) 12/12/2015  . TETANUS/TDAP  04/08/2018  . INFLUENZA VACCINE  01/11/2020  . DEXA SCAN  Completed  . COVID-19 Vaccine  Completed    Physical Exam: Vitals:   02/11/20 1302  BP: 116/64  Pulse: 61  Temp: 98.1 F (36.7 C)  SpO2: 97%  Weight: 139 lb 12.8 oz (63.4  kg)  Height: 5' 1.5" (1.562 m)   Body mass index is 25.99 kg/m. Physical Exam Vitals reviewed.  Constitutional:      Appearance: Normal appearance.  HENT:     Head: Normocephalic.     Right Ear: Tympanic membrane normal.     Left Ear: Tympanic membrane normal.     Nose: Nose normal.     Mouth/Throat:     Mouth: Mucous membranes are moist.     Pharynx: Oropharynx is clear.  Eyes:     Pupils: Pupils are equal, round, and reactive to light.  Cardiovascular:     Rate and Rhythm: Normal rate and regular rhythm.     Pulses: Normal pulses.     Heart sounds: Normal heart sounds.  Pulmonary:     Effort: Pulmonary effort is normal. No respiratory distress.     Breath sounds: Normal breath sounds. No wheezing or rales.  Abdominal:     General: Abdomen is flat. Bowel sounds are normal.     Palpations: Abdomen is soft.  Musculoskeletal:        General: No swelling.     Cervical back: Neck supple.     Comments: No Swelling. Just mild tenderness in Lower Leg near her Shin  Skin:    General: Skin is warm and dry.  Neurological:     Mental Status: She is alert and oriented to person, place, and time.     Comments: Walks with Left Leg Limp. Good strength in all extremities. Mild Proximal weakness in Left Lower Leg  Psychiatric:     Comments: Feels Depressed     Labs reviewed: Basic Metabolic Panel: Recent Labs    08/29/19 0826 09/02/19 1016 11/19/19 0929  NA 141 136 142  K 4.2 4.2 3.9  CL 104 100 103  CO2 27 27 29   GLUCOSE 102* 100* 82  BUN 10 12 11   CREATININE  0.76 0.74 0.81  CALCIUM 9.6 8.8* 9.5  MG  --   --  2.1  TSH  --   --  2.44   Liver Function Tests: Recent Labs    08/29/19 0826 11/19/19 0929  AST 22 20  ALT 17 15  BILITOT 0.5 0.4  PROT 6.8 5.9*   No results for input(s): LIPASE, AMYLASE in the last 8760 hours. No results for input(s): AMMONIA in the last 8760 hours. CBC: Recent Labs    08/29/19 0826 09/02/19 1016 11/19/19 0929  WBC 6.3 7.0 6.2   NEUTROABS 3,484 4.4 3,422  HGB 13.8 13.5 12.9  HCT 41.8 42.1 38.7  MCV 95.2 99.8 99.2  PLT 374 307 270   Lipid Panel: Recent Labs    08/29/19 0826  CHOL 228*  HDL 93  LDLCALC 112*  TRIG 115  CHOLHDL 2.5   No results found for: HGBA1C  Procedures since last visit: No results found.  Assessment/Plan Left leg pain - Plan: DG Ankle Complete Left, DG Tibia/Fibula Left Has had issues before. Will Order Xrays  Chronic bilateral low back pain without sciatica Has Appoiunmnet With Neurosurgery On Neurontin Lymphocytic colitis Stopped Budesonide Due to Sideeffects Will Follow with GI if Dirrhea comes back Age-related osteoporosis without current pathological fracture On Prolia Last Shot in May 21 Recurrent UTI On Mithenimine.And [place] Sees Urology Hyperlipidemia, unspecified hyperlipidemia type Did not tolerate Lipitor  Anxiety Had taken Trazodone Before wants to know if she can restart that agian Prescription Called  Genital herpes simplex, unspecified site On Vancyclovir  TIA like Symptoms.     Labs/tests ordered:  * No order type specified * Next appt:  05/10/2020

## 2020-02-12 ENCOUNTER — Ambulatory Visit
Admission: RE | Admit: 2020-02-12 | Discharge: 2020-02-12 | Disposition: A | Discharge status: Home / Self Care | Payer: Medicare Other | Source: Ambulatory Visit | Attending: Internal Medicine | Admitting: Internal Medicine

## 2020-02-12 DIAGNOSIS — M79605 Pain in left leg: Secondary | ICD-10-CM

## 2020-02-12 IMAGING — DX DG TIBIA/FIBULA 2V*L*
2 series · 2 of 2 positions shown · non-contrast
Comparison: None.

CLINICAL DATA: Distal left lower leg pain for 2 months

EXAM:
LEFT TIBIA AND FIBULA - 2 VIEW

[dg tibia/fibula left (1 of 2)]
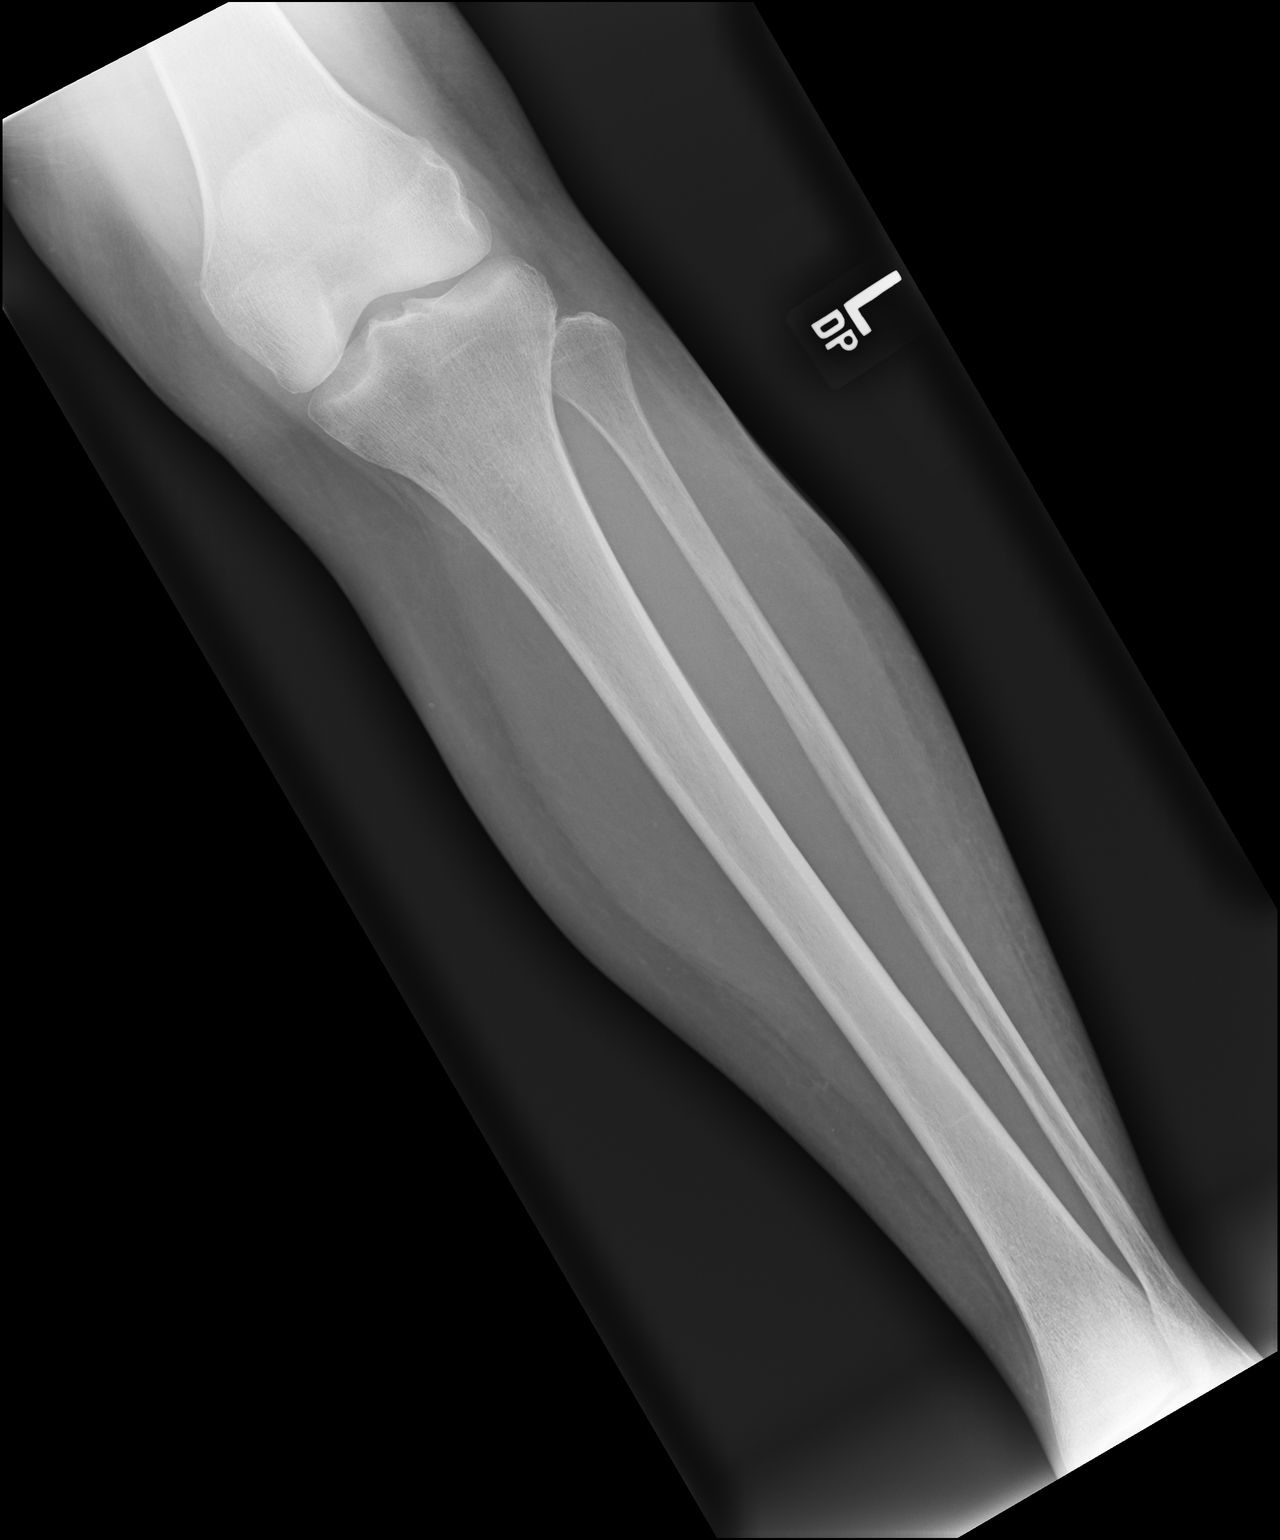

[dg tibia/fibula left (2 of 2)]
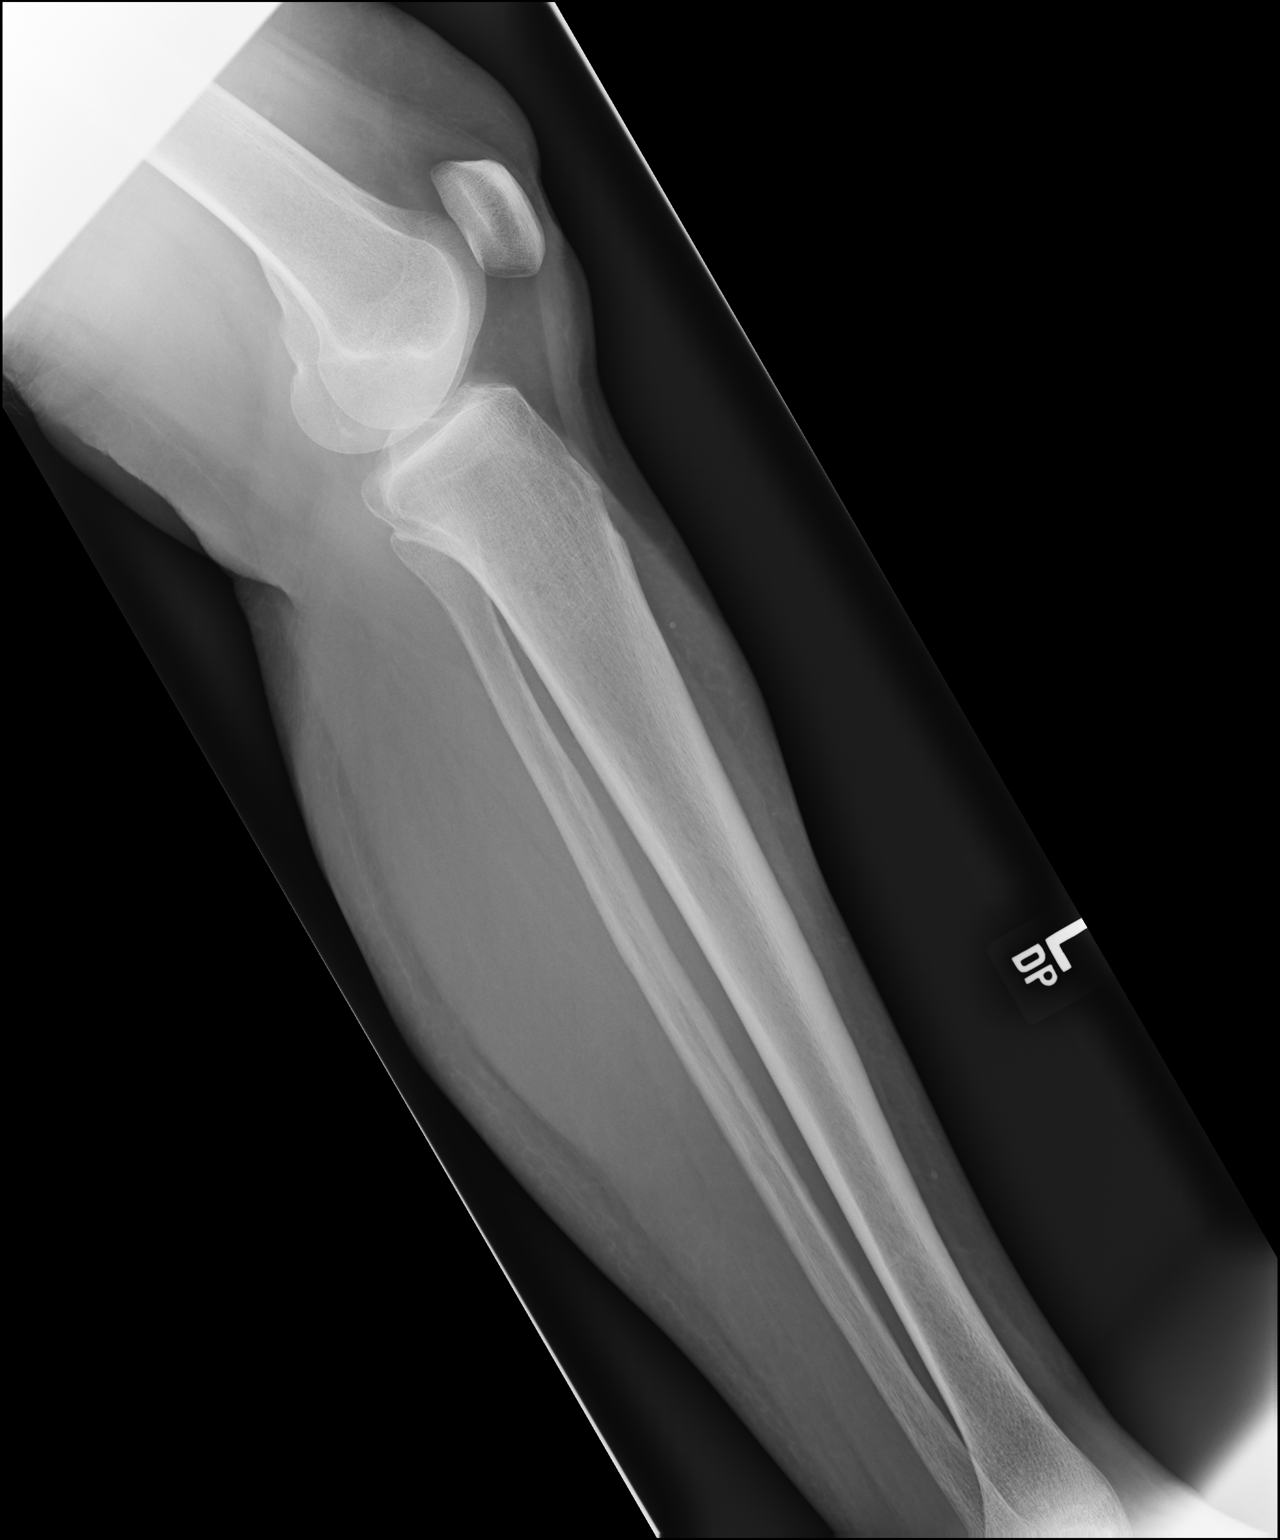

[2 of 2 positions shown; findings below may reference images not displayed]

FINDINGS: Nonspecific subcutaneous reticulation. No evidence of fracture or
bone lesion. No degenerative spurring at the ankle or knee (see
ankle radiographic series).
IMPRESSION: 1. Nonspecific subcutaneous edematous appearance.
2. Osteopenia with otherwise unremarkable osseous structures.

## 2020-03-03 ENCOUNTER — Other Ambulatory Visit: Payer: Self-pay | Admitting: Family Medicine

## 2020-03-03 DIAGNOSIS — Z1231 Encounter for screening mammogram for malignant neoplasm of breast: Secondary | ICD-10-CM

## 2020-03-06 ENCOUNTER — Other Ambulatory Visit: Payer: Self-pay | Admitting: Internal Medicine

## 2020-03-08 NOTE — Telephone Encounter (Signed)
Dr.Gupta we have only been giving this patient #30 with no refills  Spoke with patient, patient is taking mediation daily NOT as needed as listed on medication list  Patient states she is going to take this medication for the rest of her life, patient tried to d/c medication in the past had an outbreak.  Please review and advise if we can provide patient with a year supply of medication versus 30 days at a time. Also advise if we should change the medication list to reflect patient taking daily versus as needed  Thanks, S.Chrae B/CMA

## 2020-04-14 ENCOUNTER — Ambulatory Visit
Admission: RE | Admit: 2020-04-14 | Discharge: 2020-04-14 | Disposition: A | Discharge status: Home / Self Care | Payer: Medicare Other | Source: Ambulatory Visit | Attending: Family Medicine | Admitting: Family Medicine

## 2020-04-14 ENCOUNTER — Other Ambulatory Visit: Payer: Self-pay

## 2020-04-14 DIAGNOSIS — Z1231 Encounter for screening mammogram for malignant neoplasm of breast: Secondary | ICD-10-CM

## 2020-04-30 ENCOUNTER — Other Ambulatory Visit: Payer: Self-pay

## 2020-04-30 ENCOUNTER — Ambulatory Visit: Payer: Medicare Other | Attending: Neurosurgery | Admitting: Physical Therapy

## 2020-04-30 ENCOUNTER — Encounter: Payer: Self-pay | Admitting: Physical Therapy

## 2020-04-30 DIAGNOSIS — G8929 Other chronic pain: Secondary | ICD-10-CM | POA: Insufficient documentation

## 2020-04-30 DIAGNOSIS — M546 Pain in thoracic spine: Secondary | ICD-10-CM | POA: Diagnosis present

## 2020-04-30 DIAGNOSIS — M6281 Muscle weakness (generalized): Secondary | ICD-10-CM | POA: Diagnosis present

## 2020-04-30 DIAGNOSIS — M5442 Lumbago with sciatica, left side: Secondary | ICD-10-CM | POA: Diagnosis not present

## 2020-04-30 NOTE — Therapy (Signed)
[place], [place], [address] Phone: [phone]   Fax:  [phone]  Physical Therapy Evaluation  Patient Details  Name: Carol Perez MRN: [id number] Date of Birth: 1941/01/21 Referring Provider (PT): Erline Levine, MD   Encounter Date: 04/30/2020   PT End of Session - 04/30/20 1353    Visit Number 1    Number of Visits 10    Date for PT Re-Evaluation 06/11/20    Authorization Type [employer] Medicare, progress note by visit 48, recheck FOTO at visit 6    PT Start Time 0712    PT Stop Time 0808    PT Time Calculation (min) 56 min    Activity Tolerance Patient tolerated treatment well    Behavior During Therapy [facility] for tasks assessed/performed           Past Medical History:  Diagnosis Date  . Barrett's esophagus   . Colitis   . DDD (degenerative disc disease)   . Erroneous encounter - disregard    error   . Erroneous encounter - disregard   . Erroneous encounter - disregard   . Factor 5 Leiden mutation, heterozygous [facility])    Per [place] Patient Packet.  . Frequent UTI   . H/O echocardiogram 04/01/2019   Per [place] Patient Packet.  . Hiatal hernia    Per [place] Patient Packet.  . High grade dysplasia of Barrett's epithelium    Per [facility] New Patient Packet.  Marland Kitchen History of bladder infections    Per [facility] New Patient Packet.  Marland Kitchen History of bone density study 07/12/2018   Per [place] Patient Packet.  Marland Kitchen History of bone density study 07/12/2018   Up coming appointment 07/17/2019, Per [facility] New Patient Packet  . History of bone density study 07/17/2019  . History of endoscopy 12/17/2015   Needed Every 3 years. Scheduled for January 2021. Per [facility] New Patient Packet.   Marland Kitchen History of mammogram 04/14/2019   Per [place] Patient Packet.  Marland Kitchen History of MRI 03/01/2019   By Dr.Gulati/ Neurologist. MRI of Brain. Per [facility] New Patient Packet.  Marland Kitchen History of MRI 04/23/2019   By Dr. Maryan Rued at [facility]. Per  [facility] New Patient Packet.  Marland Kitchen History of Papanicolaou smear of cervix 10/16/2011   Per [place] Patient Packet  . Hypotension    Per [facility] New Patient Packet.  . IBS (irritable bowel syndrome)    Per [place] New Patient Packet.  . Lactose intolerance   . Osteoarthritis    Per [facility] New Patient Packet.  Marland Kitchen Osteopenia    Per [place] New Patient Packet.  . Osteoporosis   . Premature ventricular complex   . Raynaud's disease    Per [facility] New Patient Packet.  . Scoliosis    Per [place] New Patient Packet.    Past Surgical History:  Procedure Laterality Date  . ABDOMINAL HYSTERECTOMY  06/13/2011   By Dr.Scherer at [facility]. Per [facility] New Patient Packet.  Marland Kitchen BREAST EXCISIONAL BIOPSY Left 30+ yrs ago  . COLONOSCOPY  11/23/2011   By Dr.McCune at [facility]. Per [place] Patient Packet.  . COLONOSCOPY  08/26/2019  . MAMMOGRAM  04/14/2019  . pap smear  10/16/2011  . SIGMOIDOSCOPY  11/02/2014   Per [place] New Patient Packet  . TONSILLECTOMY  06/12/1948   Per [place] New Patient Packet.    There were no vitals filed for this visit.    Subjective Assessment -  04/30/20 0720    Subjective Pt. is a 79 y/o female referred to PT for c/o back pain. She has multi-year history of symptoms of chronic thoracic and lumbar pain symptoms as well as neck pain. She was seen last Spring for PT for neck and thoracic pain with benefit noted at the time in particular from dry needling so would like to try dry needling for thoracic region again. She reports pending back surgery for fusion to address underlying scoliosis with Dr. Vertell Limber planned for this coming January. For her low back she localizes primary current pain around her SIJ. She had been having left LE radiating pain but reports no recent radiating leg symptoms in the past several months though she does report that her left leg is weaker.    Pertinent History chronic back and neck pain, scoliosis, DDD, osteopenia/osteoporosis, Raynaud's    Limitations  Lifting;Sitting;House hold activities    Diagnostic tests past MRI and X-rays    Patient Stated Goals Ease back pain symptoms    Currently in Pain? Yes    Pain Score 2     Pain Location Back    Pain Orientation Lower    Pain Descriptors / Indicators Dull;Aching    Pain Type Chronic pain    Pain Onset More than a month ago    Pain Frequency Intermittent    Aggravating Factors  vacuuming, bending motions, twisting    Pain Relieving Factors gabapentin, walking              [facility] PT Assessment - 04/30/20 0001      Assessment   Medical Diagnosis Lumbago with left sciatica    Referring Provider (PT) Erline Levine, MD    Onset Date/Surgical Date 10/29/19   symptoms chronic, exacerbated the past 6 months   Hand Dominance Right    Prior Therapy past PT for neck and thoracic region in April to May of this year      Precautions   Precaution Comments osteoporosis, Raynaud's no cryo      Restrictions   Weight Bearing Restrictions No      Balance Screen   Has the patient fallen in the past 6 months No      [place] living   "Friend's Home"   Home Equipment Shower seat    Additional Comments Lives with spouse      Prior Function   Level of Independence Independent with basic ADLs;Independent with community mobility with device   uses SPC to ambulate     Cognition   Overall Cognitive Status Within Functional Limits for tasks assessed      Observation/Other Assessments   Focus on Therapeutic Outcomes (FOTO)  49% limited      Sensation   Light Touch Impaired Detail    Light Touch Impaired Details --   decreased at L3 dermatome on left     Posture/Postural Control   Posture/Postural Control Postural limitations    Postural Limitations Increased thoracic kyphosis    Posture Comments right convex thoracic scoliosis      Deep Tendon Reflexes   DTR Assessment Site Patella;Achilles    Patella DTR 2+    Achilles DTR 2+      ROM / Strength     AROM / PROM / Strength AROM;Strength      AROM   Overall AROM Comments Trunk AROM grossly WFL for mat mobility and transfers otherwise not tested to end-ranges due to history of osteoporosis  Strength   Strength Assessment Site Hip;Knee;Ankle    Right/Left Hip Right;Left    Right Hip Flexion 4/5    Right Hip External Rotation  4/5    Right Hip Internal Rotation 4/5    Left Hip Flexion 4/5    Left Hip External Rotation 4/5    Left Hip Internal Rotation 4/5    Right/Left Knee Right;Left    Right Knee Flexion 5/5    Right Knee Extension 5/5    Left Knee Flexion 4/5    Left Knee Extension 4/5    Right/Left Ankle Right;Left    Right Ankle Dorsiflexion 5/5    Right Ankle Inversion 4/5    Right Ankle Eversion 4/5    Left Ankle Dorsiflexion 5/5    Left Ankle Inversion 4/5    Left Ankle Eversion 4/5      Flexibility   Soft Tissue Assessment /Muscle Length --   SLR 80 deg bilat., increased LBP with right SLR at 80 deg     Palpation   SI assessment  (+) Thigh thrust bilat. and SI compression/distraction also (+), Mild increase in SI pain with anterior pelvic tilt with partial lunge, mild symptom ease with right step and bend for posterior pelvic tilt    Palpation comment Tender to palpation right thoracic longissimus at T8-9 region and bilateral upper trapezius      Special Tests   Other special tests SLR (-)                      Objective measurements completed on examination: See above findings.       [facility] Adult PT Treatment/Exercise - 04/30/20 0001      Exercises   Exercises --   HEP handout review           Trigger Point Dry Needling - 04/30/20 0001    Consent Given? Yes    Education Handout Provided Previously provided    Muscles Treated Head and Neck Upper trapezius   bilat.   Muscles Treated Back/Hip Erector spinae   right thoracic longissimus at T8-9 region   Dry Needling Comments needling in prone over pillow with 30 gauge 30 mm needles     Electrical Stimulation Performed with Dry Needling Yes    E-stim with Dry Needling Details TENS 20 pps x 10 minutes                PT Education - 04/30/20 1353    Education Details HEP, POC, eval findings    Person(s) Educated Patient    Methods Explanation;Verbal cues;Handout    Comprehension Verbalized understanding               PT Long Term Goals - 04/30/20 1405      PT LONG TERM GOAL #1   Title Independent with HEP    Baseline instructed at eval, will update prn    Time 6    Period Weeks    Status New    Target Date 06/11/20      PT LONG TERM GOAL #2   Title Improve FOTO outcome measure score to 41% or less impairment    Baseline 49% limited    Time 6    Period Weeks    Status New    Target Date 06/11/20      PT LONG TERM GOAL #3   Title Increase left knee and bilat. hip strength grossly 1/2 MMT grade to improve ability for stair navigation, and transfers from  low seats    Baseline 4/5    Time 6    Period Weeks    Status New    Target Date 06/11/20      PT LONG TERM GOAL #4   Title Perform chores at home for vacuuming, cleaning as needed with back pain decreased 40% or greater from baseline status    Baseline increased pain and difficulty with these activities    Time 6    Period Weeks    Status New    Target Date 06/11/20                  Plan - 04/30/20 1355    Clinical Impression Statement Pt. presents with chronic LBP with underlying scoliosis and current primary symptoms localized around SI joint region bilaterally right>left. Would suspect potential underlying stenosis based on age and PMH but symptoms are exacerbated with bending>standing and walking which would be atypical for stenosis. She does report past MRI findings of a disc pathology but no copy of report at eval to correlate with symptoms. She localizes her primary pain around her SIJ and presents with (+) test item cluster for SI provocative tests so suspect SI as contributing  to current symptoms. She will be pending surgery in January and her primary current wish is to help ease lower thoracic region symptoms with more dry needling to plan a course of therapy visits prior to her back surgery to help address.    Personal Factors and Comorbidities Age;Comorbidity 3+;Time since onset of injury/illness/exacerbation    Examination-Activity Limitations Lift;Carry;Bend    Examination-Participation Restrictions Cleaning    Stability/Clinical Decision Making Evolving/Moderate complexity    Clinical Decision Making Moderate    Rehab Potential Good    PT Frequency --   1-2x/week   PT Duration 6 weeks    PT Treatment/Interventions ADLs/Self Care Home Management;Electrical Stimulation;Moist Heat;Therapeutic activities;Therapeutic exercise;Neuromuscular re-education;Patient/family education;Manual techniques;Dry needling;Taping    PT Next Visit Plan Review FOTO pt. 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 Occipital neuralgia of right side 09/08/2019  . Recurrent UTI 06/26/2019  . Osteoporosis 06/26/2019  . IBS (irritable bowel syndrome) 06/26/2019  . Hyperlipidemia 06/26/2019  . Back pain 06/26/2019  . Barrett's esophagus 06/26/2019    Beaulah Dinning, PT, DPT 04/30/20 2:10  PM  [facility] [address] [place], [place], [address] Phone: [phone]   Fax:  [phone]  Name: Carol Perez MRN: [id number] Date of Birth: 04-23-1941

## 2020-05-10 ENCOUNTER — Other Ambulatory Visit: Payer: Self-pay

## 2020-05-10 ENCOUNTER — Ambulatory Visit (INDEPENDENT_AMBULATORY_CARE_PROVIDER_SITE_OTHER): Payer: Medicare Other

## 2020-05-10 DIAGNOSIS — M810 Age-related osteoporosis without current pathological fracture: Secondary | ICD-10-CM | POA: Diagnosis not present

## 2020-05-10 MED ORDER — DENOSUMAB 60 MG/ML ~~LOC~~ SOSY
60.0000 mg | PREFILLED_SYRINGE | Freq: Once | SUBCUTANEOUS | Status: AC
  Administered 2020-05-10: 60 mg via SUBCUTANEOUS

## 2020-05-12 ENCOUNTER — Non-Acute Institutional Stay: Payer: Medicare Other | Admitting: Internal Medicine

## 2020-05-12 ENCOUNTER — Other Ambulatory Visit: Payer: Self-pay

## 2020-05-12 ENCOUNTER — Encounter: Payer: Self-pay | Admitting: Internal Medicine

## 2020-05-12 VITALS — BP 132/80 | HR 68 | Temp 96.6°F | Ht 61.5 in | Wt 137.2 lb

## 2020-05-12 DIAGNOSIS — F419 Anxiety disorder, unspecified: Secondary | ICD-10-CM

## 2020-05-12 DIAGNOSIS — M79605 Pain in left leg: Secondary | ICD-10-CM | POA: Diagnosis not present

## 2020-05-12 DIAGNOSIS — M810 Age-related osteoporosis without current pathological fracture: Secondary | ICD-10-CM

## 2020-05-12 DIAGNOSIS — M545 Low back pain, unspecified: Secondary | ICD-10-CM

## 2020-05-12 DIAGNOSIS — K52832 Lymphocytic colitis: Secondary | ICD-10-CM | POA: Diagnosis not present

## 2020-05-12 DIAGNOSIS — G8929 Other chronic pain: Secondary | ICD-10-CM

## 2020-05-12 DIAGNOSIS — A6000 Herpesviral infection of urogenital system, unspecified: Secondary | ICD-10-CM

## 2020-05-12 DIAGNOSIS — E785 Hyperlipidemia, unspecified: Secondary | ICD-10-CM

## 2020-05-12 DIAGNOSIS — N390 Urinary tract infection, site not specified: Secondary | ICD-10-CM

## 2020-05-12 NOTE — Progress Notes (Signed)
Location:      Place of Service:     Provider:   Code Status:  Goals of Care:  Advanced Directives 04/30/2020  Does Patient Have a Medical Advance Directive? Yes  Type of Paramedic of [place];Living will  Does patient want to make changes to medical advance directive? No - Patient declined  Copy of [place] in Chart? No - copy requested  Would patient like information on creating a medical advance directive? -     Chief Complaint  Patient presents with  . Medical Management of Chronic Issues    Upcoming back sugery 2022 and left cataract surgery tomorrow.    HPI: Patient is a 79 y.o. female seen today for medical management of chronic diseases.   Patient has a history of recurrent UTI prophylaxis with Hip-Rex History of depression History of lymphocytic colitis was not able to tolerate budesonide History of anxiety attacks versus TIA Previous work-up with neurology including MRI. EEG has been negative  Her main issue today was to discuss upcoming back surgery. Patient has been seeing Dr. Vertell Limber. Low back pain radiating down to left leg with weakness of left leg. After MRI of her back he has suggested that she needs surgery for her scoliosis. It seems like it is going to be a 2-day surgery and she would need to stay in rehab after that. She is little apprehensive about surgery. It is scheduled in January.  No other acute issues today She is using cane to walk now. Continues to have issues with anxiety but controls with deep breathing  No recent UTI No diarrhea or abdominal pain does follow special diet She has a son who lives in [place] and a niece who is the [place] Past Medical History:  Diagnosis Date  . Barrett's esophagus   . Colitis   . DDD (degenerative disc disease)   . Erroneous encounter - disregard    error   . Erroneous encounter - disregard   . Erroneous encounter - disregard   . Factor 5 Leiden mutation,  heterozygous [facility])    Per [place] Patient Packet.  . Frequent UTI   . H/O echocardiogram 04/01/2019   Per [place] Patient Packet.  . Hiatal hernia    Per [place] Patient Packet.  . High grade dysplasia of Barrett's epithelium    Per [facility] New Patient Packet.  Marland Kitchen History of bladder infections    Per [facility] New Patient Packet.  Marland Kitchen History of bone density study 07/12/2018   Per [place] Patient Packet.  Marland Kitchen History of bone density study 07/12/2018   Up coming appointment 07/17/2019, Per [facility] New Patient Packet  . History of bone density study 07/17/2019  . History of endoscopy 12/17/2015   Needed Every 3 years. Scheduled for January 2021. Per [facility] New Patient Packet.   Marland Kitchen History of mammogram 04/14/2019   Per [place] Patient Packet.  Marland Kitchen History of MRI 03/01/2019   By Dr.Gulati/ Neurologist. MRI of Brain. Per [facility] New Patient Packet.  Marland Kitchen History of MRI 04/23/2019   By Dr. [facility]an [facility]ued at [facility]. Per [facility] New Patient Packet.  Marland Kitchen History of Papanicolaou smear of cervix 10/16/2011   Per [place] Patient Packet  . Hypotension    Per [facility] New Patient Packet.  . IBS (irritable bowel syndrome)    Per [place] New Patient Packet.  . Lactose intolerance   . Osteoarthritis    Per [facility] New Patient Packet.  Marland Kitchen  Osteopenia    Per [place] New Patient Packet.  . Osteoporosis   . Premature ventricular complex   . Raynaud's disease    Per [facility] New Patient Packet.  . Scoliosis    Per [place] New Patient Packet.    Past Surgical History:  Procedure Laterality Date  . ABDOMINAL HYSTERECTOMY  06/13/2011   By Dr.Scherer at [facility]. Per [facility] New Patient Packet.  Marland Kitchen BREAST EXCISIONAL BIOPSY Left 30+ yrs ago  . COLONOSCOPY  11/23/2011   By Dr.McCune at [facility]. Per [place] Patient Packet.  . COLONOSCOPY  08/26/2019  . MAMMOGRAM  04/14/2019  . pap smear  10/16/2011  . SIGMOIDOSCOPY  11/02/2014   Per [place] New Patient Packet  . TONSILLECTOMY  06/12/1948   Per [place] New Patient Packet.     Allergies  Allergen Reactions  . Aspirin Other (See Comments)    Upsets ulcers  . Avelox [Moxifloxacin]   . Banana     Swelling around mouth and eyes and red blotches  . Ciprofloxacin Hcl   . Levofloxacin   . Norfloxacin   . Nsaids Other (See Comments)    Upsets ulcers  . Ofloxacin   . Other     FLOXIN  . Penicillins Hives  . Tape   . Tequin [Gatifloxacin]   . Latex Swelling and Rash  . Methylisothiazolinone Rash    Outpatient Encounter Medications as of 05/12/2020  Medication Sig  . aspirin EC 81 MG tablet Take 81 mg by mouth in the morning and at bedtime.   . B Complex Vitamins (VITAMIN B-COMPLEX) TABS Take 1 tablet by mouth daily. With vitamin C  . Biotin 5000 MCG TABS Take 1 tablet by mouth daily.  Marland Kitchen CALCIUM PO Take 1,200 mg by mouth daily.   . Cholecalciferol (D3-1000 PO) Take by mouth daily. 2,000 IU  . conjugated estrogens (PREMARIN) vaginal cream Place 1 Applicatorful vaginally 3 (three) times a week.  . gabapentin (NEURONTIN) 100 MG capsule Take 100 mg by mouth daily as needed for pain.  Marland Kitchen Lifitegrast (XIIDRA OP) Apply 1 drop to eye 2 (two) times daily.   Marland Kitchen loperamide (IMODIUM) 2 MG capsule Take 2 mg by mouth as needed for diarrhea or loose stools.  . methenamine (HIPREX) 1 g tablet Take 1 g by mouth 2 (two) times daily with a meal. For UTI Prevention. Last UTI: October 20th, November 5th, and December 28th.   . Methenamine-Sodium Salicylate (CYSTEX PO) Take 100 mg by mouth daily. ( Cranberry Concentrate )  . Multiple Vitamins-Minerals (ONE-A-DAY WOMENS PO) Take 1 tablet by mouth daily.  . NON FORMULARY CBD Full spectrum 1 ml as needed for sleep.  Marland Kitchen Omeprazole-Sodium Bicarbonate (ZEGERID PO) Take 20 mg by mouth 2 (two) times daily.  . Polyvinyl Alcohol-Povidone (REFRESH OP) Apply 1 drop to eye as needed.  . Probiotic Product (PROBIOTIC COLON SUPPORT PO) Take 1 tablet by mouth daily.  . traZODone (DESYREL) 50 MG tablet Take 50 mg by mouth at bedtime.  . traZODone  (DESYREL) 50 MG tablet Take 0.5-1 tablets (25-50 mg total) by mouth at bedtime as needed for sleep.  . valACYclovir (VALTREX) 500 MG tablet TAKE (1) TABLET DAILY AS NEEDED.  . [DISCONTINUED] gabapentin (NEURONTIN) 100 MG capsule Take 1 capsule (100 mg total) by mouth 3 (three) times daily as needed.  . [DISCONTINUED] Glucosamine HCl 1000 MG TABS Take 1 tablet by mouth 2 (two) times daily.  . [DISCONTINUED] magnesium oxide (MAG-OX) 400 MG tablet Take 400 mg  by mouth daily.  . [DISCONTINUED] Multiple Vitamins-Minerals (PRESERVISION AREDS 2 PO) Take 1 tablet by mouth daily.  . [DISCONTINUED] Turmeric (QC TUMERIC COMPLEX) 500 MG CAPS Take 1 capsule by mouth daily.   No facility-administered encounter medications on file as of 05/12/2020.    Review of Systems:  Review of Systems  Constitutional: Positive for activity change.  HENT: Negative.   Respiratory: Negative.   Cardiovascular: Negative.   Gastrointestinal: Negative.   Genitourinary: Negative.   Musculoskeletal: Positive for arthralgias, back pain, gait problem and myalgias.  Skin: Negative.   Neurological: Positive for weakness.  Psychiatric/Behavioral: Positive for dysphoric mood.    Health Maintenance  Topic Date Due  . Hepatitis C Screening  Never done  . PNA vac Low Risk Adult (2 of 2 - PPSV23) 12/12/2015  . TETANUS/TDAP  04/08/2018  . INFLUENZA VACCINE  01/11/2020  . DEXA SCAN  Completed  . COVID-19 Vaccine  Completed    Physical Exam: Vitals:   05/12/20 1503  BP: 132/80  Pulse: 68  Temp: (!) 96.6 F (35.9 C)  SpO2: 98%  Weight: 137 lb 3.2 oz (62.2 kg)  Height: 5' 1.5" (1.562 m)   Body mass index is 25.5 kg/m. Physical Exam  Constitutional: Oriented to person, place, and time. Well-developed and well-nourished.  HENT:  Head: Normocephalic.  Mouth/Throat: Oropharynx is clear and moist.  Eyes: Pupils are equal, round, and reactive to light.  Neck: Neck supple.  Cardiovascular: Normal rate and normal heart  sounds.  No murmur heard. Pulmonary/Chest: Effort normal and breath sounds normal. No respiratory distress. No wheezes. She has no rales.  Abdominal: Soft. Bowel sounds are normal. No distension. There is no tenderness. There is no rebound.  Musculoskeletal: No edema.  Lymphadenopathy: none Neurological: Alert and oriented to person, place, and time.  Skin: Skin is warm and dry.  Psychiatric: Normal mood and affect. Behavior is normal. Thought content normal.    Labs reviewed: Basic Metabolic Panel: Recent Labs    08/29/19 0826 09/02/19 1016 11/19/19 0929  NA 141 136 142  K 4.2 4.2 3.9  CL 104 100 103  CO2 27 27 29   GLUCOSE 102* 100* 82  BUN 10 12 11   CREATININE 0.76 0.74 0.81  CALCIUM 9.6 8.8* 9.5  MG  --   --  2.1  TSH  --   --  2.44   Liver Function Tests: Recent Labs    08/29/19 0826 11/19/19 0929  AST 22 20  ALT 17 15  BILITOT 0.5 0.4  PROT 6.8 5.9*   No results for input(s): LIPASE, AMYLASE in the last 8760 hours. No results for input(s): AMMONIA in the last 8760 hours. CBC: Recent Labs    08/29/19 0826 09/02/19 1016 11/19/19 0929  WBC 6.3 7.0 6.2  NEUTROABS 3,484 4.4 3,422  HGB 13.8 13.5 12.9  HCT 41.8 42.1 38.7  MCV 95.2 99.8 99.2  PLT 374 307 270   Lipid Panel: Recent Labs    08/29/19 0826  CHOL 228*  HDL 93  LDLCALC 112*  TRIG 115  CHOLHDL 2.5   No results found for: HGBA1C  Procedures since last visit: MM 3D SCREEN BREAST BILATERAL  Result Date: 04/16/2020 CLINICAL DATA:  Screening. EXAM: DIGITAL SCREENING BILATERAL MAMMOGRAM WITH TOMO AND CAD COMPARISON:  Previous exam(s). ACR Breast Density Category b: There are scattered areas of fibroglandular density. FINDINGS: There are no findings suspicious for malignancy. Images were processed with CAD. IMPRESSION: No mammographic evidence of malignancy. A result letter of this  screening mammogram will be mailed directly to the patient. RECOMMENDATION: Screening mammogram in one year.  (Code:SM-B-01Y) BI-RADS CATEGORY  1: Negative. Electronically Signed   By: Fidela Salisbury M.D.   On: 04/16/2020 09:51    Assessment/Plan  Chronic bilateral low back pain without sciatica Plan for back surgery in January We discussed the pros and cons of surgery. Patient also wanted to talk about her rehab and [facility]  Age-related osteoporosis without current pathological fracture ON Prolia in 11/21  Left leg pain Xray showed some soft tissue swelling Patient has had work up for this before Right now will await her back surgery before following up for this Lymphocytic colitis Did not tolerate Budesonide Right now symptoms are controlled with Dietary modifications Recurrent UTI On Methenamine and Estrace Follows with Urology Hyperlipidemia, unspecified hyperlipidemia type Did not tolerate Lipitor Will discuss other statins Genital herpes simplex, unspecified site On Vancyclovir Anxiety Doing well on Trazadone   Labs/tests ordered:  * No order type specified * Next appt:  Visit date not found

## 2020-05-17 ENCOUNTER — Other Ambulatory Visit: Payer: Self-pay | Admitting: Neurosurgery

## 2020-05-21 ENCOUNTER — Ambulatory Visit: Payer: Medicare Other | Attending: Neurosurgery | Admitting: Physical Therapy

## 2020-05-21 ENCOUNTER — Encounter: Payer: Self-pay | Admitting: Physical Therapy

## 2020-05-21 ENCOUNTER — Other Ambulatory Visit: Payer: Self-pay

## 2020-05-21 DIAGNOSIS — M546 Pain in thoracic spine: Secondary | ICD-10-CM | POA: Insufficient documentation

## 2020-05-21 DIAGNOSIS — G8929 Other chronic pain: Secondary | ICD-10-CM | POA: Insufficient documentation

## 2020-05-21 DIAGNOSIS — M542 Cervicalgia: Secondary | ICD-10-CM | POA: Insufficient documentation

## 2020-05-21 DIAGNOSIS — R293 Abnormal posture: Secondary | ICD-10-CM | POA: Diagnosis present

## 2020-05-21 DIAGNOSIS — M5442 Lumbago with sciatica, left side: Secondary | ICD-10-CM | POA: Diagnosis present

## 2020-05-21 DIAGNOSIS — M6281 Muscle weakness (generalized): Secondary | ICD-10-CM | POA: Insufficient documentation

## 2020-05-21 NOTE — Therapy (Signed)
[place], [place], [address] Phone: [phone]   Fax:  [phone]  Physical Therapy Treatment  Patient Details  Name: Carol Perez MRN: [id number] Date of Birth: [birth year] Referring Provider (PT): Erline Levine, MD   Encounter Date: 05/21/2020   PT End of Session - 05/21/20 1213    Visit Number 2    Number of Visits 10    Date for PT Re-Evaluation 06/11/20    Authorization Type UHC Medicare, progress note by visit 58, recheck FOTO at visit 6    PT Start Time 1101    PT Stop Time 1147    PT Time Calculation (min) 46 min    Activity Tolerance Patient tolerated treatment well    Behavior During Therapy [facility] for tasks assessed/performed           Past Medical History:  Diagnosis Date  . Barrett's esophagus   . Colitis   . DDD (degenerative disc disease)   . Erroneous encounter - disregard    error   . Erroneous encounter - disregard   . Erroneous encounter - disregard   . Factor 5 Leiden mutation, heterozygous [facility])    Per [place] Patient Packet.  . Frequent UTI   . H/O echocardiogram 04/01/2019   Per [place] Patient Packet.  . Hiatal hernia    Per [place] Patient Packet.  . High grade dysplasia of Barrett's epithelium    Per [facility] New Patient Packet.  Marland Kitchen History of bladder infections    Per [facility] New Patient Packet.  Marland Kitchen History of bone density study 07/12/2018   Per [place] Patient Packet.  Marland Kitchen History of bone density study 07/12/2018   Up coming appointment 07/17/2019, Per [facility] New Patient Packet  . History of bone density study 07/17/2019  . History of endoscopy 12/17/2015   Needed Every 3 years. Scheduled for January 2021. Per [facility] New Patient Packet.   Marland Kitchen History of mammogram 04/14/2019   Per [place] Patient Packet.  Marland Kitchen History of MRI 03/01/2019   By Dr.Gulati/ Neurologist. MRI of Brain. Per [facility] New Patient Packet.  Marland Kitchen History of MRI 04/23/2019   [facility]an [facility] at [facility]. Per  [facility] New Patient Packet.  Marland Kitchen History of Papanicolaou smear of cervix 10/16/2011   Per [place] Patient Packet  . Hypotension    Per [facility] New Patient Packet.  . IBS (irritable bowel syndrome)    Per [place] New Patient Packet.  . Lactose intolerance   . Osteoarthritis    Per [facility] New Patient Packet.  Marland Kitchen Osteopenia    Per [place] New Patient Packet.  . Osteoporosis   . Premature ventricular complex   . Raynaud's disease    Per [facility] New Patient Packet.  . Scoliosis    Per [place] New Patient Packet.    Past Surgical History:  Procedure Laterality Date  . ABDOMINAL HYSTERECTOMY  06/13/2011   By Dr[facility]Scherer at [facility]. Per [facility] New Patient Packet.  Marland Kitchen BREAST EXCISIONAL BIOPSY Left 30+ yrs ago  . COLONOSCOPY  11/23/2011   By Dr.McCune at [facility]. Per [place] Patient Packet.  . COLONOSCOPY  08/26/2019  . MAMMOGRAM  04/14/2019  . pap smear  10/16/2011  . SIGMOIDOSCOPY  11/02/2014   Per [place] New Patient Packet  . TONSILLECTOMY  06/12/1948   Per [place] New Patient Packet.    There were no vitals filed for this visit.   Subjective Assessment - 05/21/20  1210    Subjective Pt. returns for first follow up session since eval-she reports treatment at eval visit helped but given interim between sessions effects have "worn off". Primary complaint is right-sided thoracic pain as well as upper trapezius region pain bilaterally. Good compliance with HEP exercises from eval visit for postural strengthening.    Pertinent History chronic back and neck pain, scoliosis, DDD, osteopenia/osteoporosis, Raynaud's                             [facility] Adult PT Treatment/Exercise - 05/21/20 0001      Exercises   Exercises Lumbar      Lumbar Exercises: Stretches   Other Lumbar Stretch Exercise sidelying right trunk stretch with right shoulder abduction/therapist assist 30 sec x 3 (from left sidelying), supine manual stretches bilaterally for upper trapezius and levator 3 x 30 sec  ea.            Trigger Point Dry Needling - 05/21/20 0001    Consent Given? Yes    Education Handout Provided Previously provided    Muscles Treated Head and Neck Upper trapezius   bilat.   Muscles Treated Back/Hip Erector spinae   right thoracic longissimus at T8-9 region   Dry Needling Comments needling in prone over pillow with 30 gauge 30 mm needles    Electrical Stimulation Performed with Dry Needling Yes    E-stim with Dry Needling Details TENS 20 pps x 10 minutes                PT Education - 05/21/20 1213    Education Details POC    Person(s) Educated Patient    Methods Explanation    Comprehension Verbalized understanding               PT Long Term Goals - 04/30/20 1405      PT LONG TERM GOAL #1   Title Independent with HEP    Baseline instructed at eval, will update prn    Time 6    Period Weeks    Status New    Target Date 06/11/20      PT LONG TERM GOAL #2   Title Improve FOTO outcome measure score to 41% or less impairment    Baseline 49% limited    Time 6    Period Weeks    Status New    Target Date 06/11/20      PT LONG TERM GOAL #3   Title Increase left knee and bilat. hip strength grossly 1/2 MMT grade to improve ability for stair navigation, and transfers from low seats    Baseline 4/5    Time 6    Period Weeks    Status New    Target Date 06/11/20      PT LONG TERM GOAL #4   Title Perform chores at home for vacuuming, cleaning as needed with back pain decreased 40% or greater from baseline status    Baseline increased pain and difficulty with these activities    Time 6    Period Weeks    Status New    Target Date 06/11/20                 Plan - 05/21/20 1214    Clinical Impression Statement Good response to tx. at eval as well as today's session to decrease muscle tension and pain as noted in subjective. Some setback with time in between visits impacted by  underlying scoliosis with continued functional limitations for  positional and walking tolerance. Plan continue PT for further progress to address mobility impairments.    Personal Factors and Comorbidities Age;Comorbidity 3+;Time since onset of injury/illness/exacerbation    Examination-Activity Limitations Lift;Carry;Bend    Examination-Participation Restrictions Cleaning    Stability/Clinical Decision Making Evolving/Moderate complexity    Clinical Decision Making Moderate    Rehab Potential Good    PT Frequency --   1-2x/week   PT Duration 6 weeks    PT Treatment/Interventions ADLs/Self Care Home Management;Electrical Stimulation;Moist Heat;Therapeutic activities;Therapeutic exercise;Neuromuscular re-education;Patient/family education;Manual techniques;Dry needling;Taping    PT Next Visit Plan Review FOTO pt. report by visit 3, no spinal mobs due to history osteopenia/osteoporosis, continue dry needling, postural + core and lumbopelvic strengthening and stabilization, modalities prn but no cryo due to Raynaud's    PT Home Exercise Plan Access code: [email or web address]    Consulted and Agree with Plan of Care Patient           Patient will benefit from skilled therapeutic intervention in order to improve the following deficits and impairments:  Pain,Postural dysfunction,Decreased strength,Decreased activity tolerance,Increased muscle spasms  Visit Diagnosis: Chronic bilateral low back pain with left-sided sciatica     Problem List Patient Active Problem List   Diagnosis Date Noted  . Occipital neuralgia of right side 09/08/2019  . Recurrent UTI 06/26/2019  . Osteoporosis 06/26/2019  . IBS (irritable bowel syndrome) 06/26/2019  . Hyperlipidemia 06/26/2019  . Back pain 06/26/2019  . Barrett's esophagus 06/26/2019    Beaulah Dinning, PT, DPT 05/21/20 12:17 PM  [facility] [address] [place], [place], [address] Phone: [phone]   Fax:  [phone]  Name: Carol Perez MRN: [id number] Date  of Birth: 10-18-1940

## 2020-05-24 ENCOUNTER — Ambulatory Visit: Payer: Medicare Other | Admitting: Physical Therapy

## 2020-05-24 ENCOUNTER — Encounter: Payer: Self-pay | Admitting: Physical Therapy

## 2020-05-24 ENCOUNTER — Other Ambulatory Visit: Payer: Self-pay

## 2020-05-24 DIAGNOSIS — M5442 Lumbago with sciatica, left side: Secondary | ICD-10-CM | POA: Diagnosis not present

## 2020-05-24 DIAGNOSIS — M546 Pain in thoracic spine: Secondary | ICD-10-CM

## 2020-05-24 DIAGNOSIS — M6281 Muscle weakness (generalized): Secondary | ICD-10-CM

## 2020-05-24 DIAGNOSIS — G8929 Other chronic pain: Secondary | ICD-10-CM

## 2020-05-24 NOTE — Therapy (Signed)
[place], [place], [address] Phone: [phone]   Fax:  [phone]  Physical Therapy Treatment  Patient Details  Name: Carol Perez MRN: [id number] Date of Birth: July 02, 1940 Referring Provider (PT): Erline Levine, MD   Encounter Date: 05/24/2020   PT End of Session - 05/24/20 1052    Visit Number 3    Number of Visits 10    Date for PT Re-Evaluation 06/11/20    Authorization Type UHC Medicare, progress note by visit 10, recheck FOTO at visit 6    PT Start Time 0845    PT Stop Time 0929    PT Time Calculation (min) 44 min    Activity Tolerance Patient tolerated treatment well    Behavior During Therapy Carol Perez for tasks assessed/performed           Past Medical History:  Diagnosis Date  . Barrett's esophagus   . Colitis   . DDD (degenerative disc disease)   . Erroneous encounter - disregard    error   . Erroneous encounter - disregard   . Erroneous encounter - disregard   . Factor 5 Leiden mutation, heterozygous [facility]    Per [place] Patient Packet.  . Frequent UTI   . H/O echocardiogram 04/01/2019   Per [place] Patient Packet.  . Hiatal hernia    Per [place] Patient Packet.  . High grade dysplasia of Barrett's epithelium    Per [facility] New Patient Packet.  Marland Kitchen History of bladder infections    Per [facility] New Patient Packet.  Marland Kitchen History of bone density study 07/12/2018   Per [place] Patient Packet.  Marland Kitchen History of bone density study 07/12/2018   Up coming appointment 07/17/2019, Per [facility] New Patient Packet  . History of bone density study 07/17/2019  . History of endoscopy 12/17/2015   Needed Every 3 years. Scheduled for January 2021. Per [facility] New Patient Packet.   Marland Kitchen History of mammogram 04/14/2019   Per [place] Patient Packet.  Marland Kitchen History of MRI 03/01/2019   By Dr.Gulati/ Neurologist. MRI of Brain. Per [facility] New Patient Packet.  Marland Kitchen History of MRI 04/23/2019   By Dr. Maryan Rued at [facility]. Per  [facility] New Patient Packet.  Marland Kitchen History of Papanicolaou smear of cervix 10/16/2011   Per [place] Patient Packet  . Hypotension    Per [facility] New Patient Packet.  . IBS (irritable bowel syndrome)    Per [place] New Patient Packet.  . Lactose intolerance   . Osteoarthritis    Per [facility] New Patient Packet.  Marland Kitchen Osteopenia    Per [place] New Patient Packet.  . Osteoporosis   . Premature ventricular complex   . Raynaud's disease    Per [facility] New Patient Packet.  . Scoliosis    Per [place] New Patient Packet.    Past Surgical History:  Procedure Laterality Date  . ABDOMINAL HYSTERECTOMY  06/13/2011   By Dr.Scherer at [facility]. Per [facility] New Patient Packet.  Marland Kitchen BREAST EXCISIONAL BIOPSY Left 30+ yrs ago  . COLONOSCOPY  11/23/2011   By Dr.McCune at [place]. Per [place] Patient Packet.  . COLONOSCOPY  08/26/2019  . MAMMOGRAM  04/14/2019  . pap smear  10/16/2011  . SIGMOIDOSCOPY  11/02/2014   Per [place] New Patient Packet  . TONSILLECTOMY  06/12/1948   Per [place] New Patient Packet.    There were no vitals filed for this visit.   Subjective Assessment - 05/24/20  1051    Subjective Patient reports continued pain in the right thoracic para-spinals. She reports it is about the same as it has been.    Pertinent History chronic back and neck pain, scoliosis, DDD, osteopenia/osteoporosis, Raynaud's    Diagnostic tests past MRI and X-rays    Patient Stated Goals Ease back pain symptoms    Currently in Pain? Yes    Pain Score 2     Pain Location Back    Pain Orientation Right;Mid    Pain Descriptors / Indicators Aching    Pain Type Chronic pain    Pain Onset More than a month ago    Pain Frequency Intermittent    Aggravating Factors  vacuming, bending    Pain Relieving Factors gabapentin                             [facility] Adult PT Treatment/Exercise - 05/24/20 0001      Lumbar Exercises: Stretches   Other Lumbar Stretch Exercise doorway right stretch up 3x20 sec hold       Manual Therapy   Manual Therapy Soft tissue mobilization    Soft tissue mobilization IASTYM to lumbar multifidi and right upper trap            Trigger Point Dry Needling - 05/24/20 0001    Consent Given? Yes    Education Handout Provided Previously provided    Muscles Treated Head and Neck Upper trapezius    Muscles Treated Back/Hip Erector spinae   right thoracic longissimus at T8-9 region   Dry Needling Comments needling in prone over pillow with 30 gauge 30 mm needles    Electrical Stimulation Performed with Dry Needling Yes    E-stim with Dry Needling Details TENS 20 pps x 10 minutes    Upper Trapezius Response Twitch reponse elicited    Erector spinae Response Twitch response elicited                PT Education - 05/24/20 1052    Education Details reviewed benefits and risk of TPDN, reviewed how to keep posture    Person(s) Educated Patient    Methods Explanation;Demonstration;Tactile cues;Verbal cues    Comprehension Returned demonstration;Verbalized understanding;Tactile cues required;Verbal cues required               PT Long Term Goals - 04/30/20 1405      PT LONG TERM GOAL #1   Title Independent with HEP    Baseline instructed at eval, will update prn    Time 6    Period Weeks    Status New    Target Date 06/11/20      PT LONG TERM GOAL #2   Title Improve FOTO outcome measure score to 41% or less impairment    Baseline 49% limited    Time 6    Period Weeks    Status New    Target Date 06/11/20      PT LONG TERM GOAL #3   Title Increase left knee and bilat. hip strength grossly 1/2 MMT grade to improve ability for stair navigation, and transfers from low seats    Baseline 4/5    Time 6    Period Weeks    Status New    Target Date 06/11/20      PT LONG TERM GOAL #4   Title Perform chores at home for vacuuming, cleaning as needed with back pain decreased 40% or greater from  baseline status    Baseline increased pain and difficulty  with these activities    Time 6    Period Weeks    Status New    Target Date 06/11/20                 Plan - 05/24/20 1053    Clinical Impression Statement Patient hasd a large mid throacic trigger point. She reported improved pain with needling and manual therapy. She was shown overhead door stretch for scoliosis. She tolerated treatment well. She has exercises at home she will continue.    Personal Factors and Comorbidities Age;Comorbidity 3+;Time since onset of injury/illness/exacerbation    Examination-Activity Limitations Lift;Carry;Bend    Examination-Participation Restrictions Cleaning    Stability/Clinical Decision Making Evolving/Moderate complexity    Clinical Decision Making Moderate    Rehab Potential Good    PT Treatment/Interventions ADLs/Self Care Home Management;Electrical Stimulation;Moist Heat;Therapeutic activities;Therapeutic exercise;Neuromuscular re-education;Patient/family education;Manual techniques;Dry needling;Taping    PT Next Visit Plan Review FOTO pt. report by visit 3, no spinal mobs due to history osteopenia/osteoporosis, continue dry needling, postural + core and lumbopelvic strengthening and stabilization, modalities prn but no cryo due to Raynaud's    PT Home Exercise Plan Access code: [email or web address]    Consulted and Agree with Plan of Care Patient           Patient will benefit from skilled therapeutic intervention in order to improve the following deficits and impairments:  Pain,Postural dysfunction,Decreased strength,Decreased activity tolerance,Increased muscle spasms  Visit Diagnosis: Chronic bilateral low back pain with left-sided sciatica  Pain in thoracic spine  Muscle weakness (generalized)     Problem List Patient Active Problem List   Diagnosis Date Noted  . Occipital neuralgia of right side 09/08/2019  . Recurrent UTI 06/26/2019  . Osteoporosis 06/26/2019  . IBS (irritable bowel syndrome) 06/26/2019  . Hyperlipidemia  06/26/2019  . Back pain 06/26/2019  . Barrett's esophagus 06/26/2019    Carney Living PT DPT  05/24/2020, 10:55 AM  [facility] [address] [place], [place], [address] Phone: [phone]   Fax:  [phone]  Name: ZAHRAH SUTHERLIN MRN: [id number] Date of Birth: Nov 25, 1940

## 2020-05-26 ENCOUNTER — Other Ambulatory Visit: Payer: Self-pay

## 2020-05-26 ENCOUNTER — Ambulatory Visit: Payer: Medicare Other | Admitting: Physical Therapy

## 2020-05-26 DIAGNOSIS — M6281 Muscle weakness (generalized): Secondary | ICD-10-CM

## 2020-05-26 DIAGNOSIS — M542 Cervicalgia: Secondary | ICD-10-CM

## 2020-05-26 DIAGNOSIS — R293 Abnormal posture: Secondary | ICD-10-CM

## 2020-05-26 DIAGNOSIS — M546 Pain in thoracic spine: Secondary | ICD-10-CM

## 2020-05-26 DIAGNOSIS — M5442 Lumbago with sciatica, left side: Secondary | ICD-10-CM | POA: Diagnosis not present

## 2020-05-26 DIAGNOSIS — G8929 Other chronic pain: Secondary | ICD-10-CM

## 2020-05-27 ENCOUNTER — Encounter: Payer: Self-pay | Admitting: Physical Therapy

## 2020-05-27 NOTE — Therapy (Signed)
[employer] [place] [place], [place], [address] Phone: [phone]   Fax:  [phone]  Physical Therapy Treatment  Patient Details  Name: Carol Perez MRN: [id number] Date of Birth: October 09, 1940 Referring Provider (PT): Erline Levine, MD   Encounter Date: 05/26/2020   PT End of Session - 05/27/20 0816    Visit Number 4    Number of Visits 10    Date for PT Re-Evaluation 06/11/20    Authorization Type UHC Medicare, progress note by visit 40, recheck FOTO at visit 6    PT Start Time 0845    PT Stop Time 0926    PT Time Calculation (min) 41 min    Activity Tolerance Patient tolerated treatment well    Behavior During Therapy [facility] for tasks assessed/performed           Past Medical History:  Diagnosis Date  . Barrett's esophagus   . Colitis   . DDD (degenerative disc disease)   . Erroneous encounter - disregard    error   . Erroneous encounter - disregard   . Erroneous encounter - disregard   . Factor 5 Leiden mutation, heterozygous [facility])    Per [place] Patient Packet.  . Frequent UTI   . H/O echocardiogram 04/01/2019   Per [place] Patient Packet.  . Hiatal hernia    Per [place] Patient Packet.  . High grade dysplasia of Barrett's epithelium    Per [facility] New Patient Packet.  Marland Kitchen History of bladder infections    Per [facility] New Patient Packet.  Marland Kitchen History of bone density study 07/12/2018   Per [place] Patient Packet.  Marland Kitchen History of bone density study 07/12/2018   Up coming appointment 07/17/2019, Per [facility] New Patient Packet  . History of bone density study 07/17/2019  . History of endoscopy 12/17/2015   Needed Every 3 years. Scheduled for January 2021. Per [facility] New Patient Packet.   Marland Kitchen History of mammogram 04/14/2019   Per [place] Patient Packet.  Marland Kitchen History of MRI 03/01/2019   By Dr.Gulati/ Neurologist. MRI of Brain. Per [facility] New Patient Packet.  Marland Kitchen History of MRI 04/23/2019   By Dr[facility] Maryan Rued at [facility]. Per  [facility] New Patient Packet.  Marland Kitchen History of Papanicolaou smear of cervix 10/16/2011   Per [place] Patient Packet  . Hypotension    Per [facility] New Patient Packet.  . IBS (irritable bowel syndrome)    Per [place] New Patient Packet.  . Lactose intolerance   . Osteoarthritis    Per [facility] New Patient Packet.  Marland Kitchen Osteopenia    Per [place] New Patient Packet.  . Osteoporosis   . Premature ventricular complex   . Raynaud's disease    Per [facility] New Patient Packet.  . Scoliosis    Per [place] New Patient Packet.    Past Surgical History:  Procedure Laterality Date  . ABDOMINAL HYSTERECTOMY  06/13/2011   By Dr.Scherer at [facility]. Per [facility] New Patient Packet.  Marland Kitchen BREAST EXCISIONAL BIOPSY Left 30+ yrs ago  . COLONOSCOPY  11/23/2011   By Dr.McCune at [place]. Per [place] Patient Packet.  . COLONOSCOPY  08/26/2019  . MAMMOGRAM  04/14/2019  . pap smear  10/16/2011  . SIGMOIDOSCOPY  11/02/2014   Per [place] New Patient Packet  . TONSILLECTOMY  06/12/1948   Per [place] New Patient Packet.    There were no vitals filed for this visit.   Subjective Assessment - 05/27/20  0806    Subjective Patient was sore for a few days after needling. She is doing better today. She continues to have pain today in the right mid thoriac area.    Pertinent History chronic back and neck pain, scoliosis, DDD, osteopenia/osteoporosis, Raynaud's    Limitations Lifting;Sitting;House hold activities    Diagnostic tests past MRI and X-rays    Patient Stated Goals Ease back pain symptoms    Currently in Pain? Yes    Pain Score 3     Pain Location Back    Pain Orientation Right    Pain Descriptors / Indicators Aching    Pain Type Chronic pain    Pain Onset More than a month ago    Pain Frequency Intermittent    Aggravating Factors  vacuming and bending    Pain Relieving Factors gabapentin    Multiple Pain Sites No                             [facility] Adult PT Treatment/Exercise - 05/27/20 0001       Lumbar Exercises: Sidelying   Other Sidelying Lumbar Exercises open book stretch 10x 5 sec hold; shouder abdcution with assit and manual stretch of the ribs x10      Manual Therapy   Manual Therapy Soft tissue mobilization;Manual Traction    Soft tissue mobilization IASTYM to lumbar multifidi and right upper trap    Manual Traction gentle manual traction of the cervical spine            Trigger Point Dry Needling - 05/27/20 0001    Consent Given? Yes    Education Handout Provided Previously provided    Muscles Treated Head and Neck Upper trapezius    Muscles Treated Back/Hip Erector spinae   right thoracic longissimus at T8-9 region   Dry Needling Comments needling in prone over pillow with 30 gauge 30 mm needles    Electrical Stimulation Performed with Dry Needling Yes    Upper Trapezius Response Twitch reponse elicited    Erector spinae Response Twitch response elicited                PT Education - 05/27/20 0815    Education Details reviewed stretching and talked about home strengthening    Person(s) Educated Patient    Methods Demonstration;Tactile cues;Verbal cues;Explanation    Comprehension Returned demonstration;Verbal cues required;Tactile cues required;Verbalized understanding               PT Long Term Goals - 05/27/20 0819      PT LONG TERM GOAL #1   Title Independent with HEP    Baseline instructed at eval, will update prn    Time 6    Period Weeks    Status New      PT LONG TERM GOAL #2   Title Improve FOTO outcome measure score to 41% or less impairment    Baseline 49% limited    Time 6    Period Weeks    Status On-going      PT LONG TERM GOAL #3   Title Increase left knee and bilat. hip strength grossly 1/2 MMT grade to improve ability for stair navigation, and transfers from low seats    Baseline 4/5    Time 6    Period Weeks    Status On-going      PT LONG TERM GOAL #4   Title Perform chores at home for vacuuming, cleaning as  needed with back pain decreased 40% or greater from baseline status    Baseline increased pain and difficulty with these activities    Time 6    Period Weeks    Status On-going      PT LONG TERM GOAL #5   Title Right cervical AROM = left to improve ability to turn head while driving    Baseline right 60 deg, left 65 deg    Time 6    Period Weeks    Status On-going                 Plan - 05/27/20 0816    Clinical Impression Statement Therapy focused on needling in her upper trap and levaotr area. We held off on needling with stim today. We still needled a few spots in her thoracic spine. Therapy also perfromed IATYM to posterior muscles and manual therapy to the cervical spine. She will continue with her exercises at home. We will continue with manual therapy and needling here to reduce pain and improve her ability to exercise prior to the surgery.    Personal Factors and Comorbidities Age;Comorbidity 3+;Time since onset of injury/illness/exacerbation    Examination-Activity Limitations Lift;Carry;Bend    Examination-Participation Restrictions Cleaning    Stability/Clinical Decision Making Evolving/Moderate complexity    Clinical Decision Making Moderate    Rehab Potential Good    PT Duration 6 weeks    PT Treatment/Interventions ADLs/Self Care Home Management;Electrical Stimulation;Moist Heat;Therapeutic activities;Therapeutic exercise;Neuromuscular re-education;Patient/family education;Manual techniques;Dry needling;Taping    PT Next Visit Plan Review FOTO pt. 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 Occipital neuralgia of right side 09/08/2019  . Recurrent UTI 06/26/2019  . Osteoporosis 06/26/2019  . IBS (irritable bowel syndrome) 06/26/2019  . Hyperlipidemia 06/26/2019  . Back pain 06/26/2019  . Barrett's esophagus 06/26/2019    Carney Living PT DPT  05/27/2020, 9:41 AM  [facility] [address] [place], [place], [address] Phone: [phone]   Fax:  [phone]  Name: Carol Perez MRN: [id number] Date of Birth: 1940-11-13

## 2020-05-31 ENCOUNTER — Other Ambulatory Visit: Payer: Self-pay

## 2020-05-31 ENCOUNTER — Ambulatory Visit: Payer: Medicare Other | Admitting: Physical Therapy

## 2020-05-31 ENCOUNTER — Encounter: Payer: Self-pay | Admitting: Physical Therapy

## 2020-05-31 DIAGNOSIS — G8929 Other chronic pain: Secondary | ICD-10-CM

## 2020-05-31 DIAGNOSIS — M5442 Lumbago with sciatica, left side: Secondary | ICD-10-CM | POA: Diagnosis not present

## 2020-05-31 DIAGNOSIS — R293 Abnormal posture: Secondary | ICD-10-CM

## 2020-05-31 DIAGNOSIS — M6281 Muscle weakness (generalized): Secondary | ICD-10-CM

## 2020-05-31 DIAGNOSIS — M546 Pain in thoracic spine: Secondary | ICD-10-CM

## 2020-05-31 DIAGNOSIS — M542 Cervicalgia: Secondary | ICD-10-CM

## 2020-05-31 NOTE — Therapy (Signed)
[place], [place], [address] Phone: [phone]   Fax:  [phone]  Physical Therapy Treatment  Patient Details  Name: ALEZANDRA EGLI MRN: [id number] Date of Birth: 01-19-1941 Referring Provider (PT): Erline Levine, MD   Encounter Date: 05/31/2020   PT End of Session - 05/31/20 1229    Visit Number 5    Number of Visits 11    Date for PT Re-Evaluation 07/12/20    Authorization Type UHC Medicare, progress note by visit 61, recheck FOTO at visit 6    PT Start Time 0850    PT Stop Time 0930    PT Time Calculation (min) 40 min    Activity Tolerance Patient tolerated treatment well    Behavior During Therapy [facility] for tasks assessed/performed           Past Medical History:  Diagnosis Date  . Barrett's esophagus   . Colitis   . DDD (degenerative disc disease)   . Erroneous encounter - disregard    error   . Erroneous encounter - disregard   . Erroneous encounter - disregard   . Factor 5 Leiden mutation, heterozygous [facility])    Per [place] Patient Packet.  . Frequent UTI   . H/O echocardiogram 04/01/2019   Per [place] Patient Packet.  . Hiatal hernia    Per [place] Patient Packet.  . High grade dysplasia of Barrett's epithelium    Per [facility] New Patient Packet.  Marland Kitchen History of bladder infections    Per [facility] New Patient Packet.  Marland Kitchen History of bone density study 07/12/2018   Per [place] Patient Packet.  Marland Kitchen History of bone density study 07/12/2018   Up coming appointment 07/17/2019, Per [facility] New Patient Packet  . History of bone density study 07/17/2019  . History of endoscopy 12/17/2015   Needed Every 3 years. Scheduled for January 2021. Per [facility] New Patient Packet.   Marland Kitchen History of mammogram 04/14/2019   Per [place] Patient Packet.  Marland Kitchen History of MRI 03/01/2019   By Dr.Gulati/ Neurologist. MRI of Brain. Per [facility] New Patient Packet.  Marland Kitchen History of MRI 04/23/2019   By Dr. Maryan Rued at [facility]. Per  [facility] New Patient Packet.  Marland Kitchen History of Papanicolaou smear of cervix 10/16/2011   Per [place] Patient Packet  . Hypotension    Per [facility] New Patient Packet.  . IBS (irritable bowel syndrome)    Per [place] New Patient Packet.  . Lactose intolerance   . Osteoarthritis    Per [facility] New Patient Packet.  Marland Kitchen Osteopenia    Per [place] New Patient Packet.  . Osteoporosis   . Premature ventricular complex   . Raynaud's disease    Per [facility] New Patient Packet.  . Scoliosis    Per [place] New Patient Packet.    Past Surgical History:  Procedure Laterality Date  . ABDOMINAL HYSTERECTOMY  06/13/2011   By Dr.Scherer at [facility]. Per [facility] New Patient Packet.  Marland Kitchen BREAST EXCISIONAL BIOPSY Left 30+ yrs ago  . COLONOSCOPY  11/23/2011   By Dr.McCune at [place] [facility]. Per [place] Patient Packet.  . COLONOSCOPY  08/26/2019  . MAMMOGRAM  04/14/2019  . pap smear  10/16/2011  . SIGMOIDOSCOPY  11/02/2014   Per [place] New Patient Packet  . TONSILLECTOMY  06/12/1948   Per [place] New Patient Packet.    There were no vitals filed for this visit.   Subjective Assessment - 05/31/20  1225    Subjective Patient had a n eye procedure and had to slpeep on the couhc. he nek an dmid back were sore.    Pertinent History chronic back and neck pain, scoliosis, DDD, osteopenia/osteoporosis, Raynaud's    Limitations Lifting;Sitting;House hold activities    Diagnostic tests past MRI and X-rays    Patient Stated Goals Ease back pain symptoms    Currently in Pain? Yes    Pain Score 3     Pain Location Back    Pain Orientation Right    Pain Descriptors / Indicators Aching    Pain Type Chronic pain    Pain Onset More than a month ago    Pain Frequency Constant    Aggravating Factors  sleeping on the couch    Pain Relieving Factors rest    Multiple Pain Sites No              [facility] PT Assessment - 05/31/20 0001      Observation/Other Assessments   Focus on Therapeutic Outcomes (FOTO)  48%      Strength    Right Hip Flexion 4+/5    Left Hip Flexion 4+/5    Right Ankle Inversion 4+/5    Right Ankle Eversion 4+/5    Left Ankle Eversion 4+/5      Palpation   Palpation comment Improved spasming in mid thoracic area and cervical spine           FOTO 48%               [facility] Adult PT Treatment/Exercise - 05/31/20 0001      Lumbar Exercises: Stretches   Other Lumbar Stretch Exercise seated rhomboid stretch for mid back 3x30 sec hold; posterior capsule stretch 3x20 sec hold.    Other Lumbar Stretch Exercise upper trap stretch 3x20 sec hold      Manual Therapy   Manual Therapy Soft tissue mobilization;Manual Traction    Soft tissue mobilization IASTYM to lumbar multifidi and right upper trap    Manual Traction gentle manual traction of the cervical spine                  PT Education - 05/31/20 1226    Education Details reviewed stretching for HEP    Person(s) Educated Patient    Methods Explanation;Demonstration;Tactile cues;Verbal cues    Comprehension Verbalized understanding;Returned demonstration;Verbal cues required               PT Long Term Goals - 05/27/20 0819      PT LONG TERM GOAL #1   Title Independent with HEP    Baseline instructed at eval, will update prn    Time 6    Period Weeks    Status New      PT LONG TERM GOAL #2   Title Improve FOTO outcome measure score to 41% or less impairment    Baseline 49% limited    Time 6    Period Weeks    Status On-going      PT LONG TERM GOAL #3   Title Increase left knee and bilat. hip strength grossly 1/2 MMT grade to improve ability for stair navigation, and transfers from low seats    Baseline 4/5    Time 6    Period Weeks    Status On-going      PT LONG TERM GOAL #4   Title Perform chores at home for vacuuming, cleaning as needed with back pain decreased 40% or greater  from baseline status    Baseline increased pain and difficulty with these activities    Time 6    Period Weeks     Status On-going      PT LONG TERM GOAL #5   Title Right cervical AROM = left to improve ability to turn head while driving    Baseline right 60 deg, left 65 deg    Time 6    Period Weeks    Status On-going                 Plan - 05/31/20 1229    Clinical Impression Statement Patints spasming appear to be improving. Overall she feels like her upper back is givng her less of a problem. She continues to work on her postiure but she feels like if she dosent think about it she ends up in bad posture. She was advised not to force her posture at this time. She tolerated stretching well. she has several stretches at home.She was advised to work on the ones that help her the most. Therapy perfromed needling to her upper trap and thoriac parapsinals> She had a great tiwtch respose to both. She would benefit from continued PT 1W6 to continue to work on strenghtening, posure, and pain relief prior to her surgery coming up.    Personal Factors and Comorbidities Age;Comorbidity 3+;Time since onset of injury/illness/exacerbation    Examination-Activity Limitations Lift;Carry;Bend    Examination-Participation Restrictions Cleaning    Stability/Clinical Decision Making Evolving/Moderate complexity    Clinical Decision Making Moderate    Rehab Potential Good    PT Duration 6 weeks    PT Treatment/Interventions ADLs/Self Care Home Management;Electrical Stimulation;Moist Heat;Therapeutic activities;Therapeutic exercise;Neuromuscular re-education;Patient/family education;Manual techniques;Dry needling;Taping    PT Next Visit Plan , no spinal mobs due to history osteopenia/osteoporosis, continue dry needling, postural + core and lumbopelvic strengthening and stabilization, modalities prn but no cryo due to Raynaud's    PT Home Exercise Plan Access code: [email or web address]    Consulted and Agree with Plan of Care Patient           Patient will benefit from skilled therapeutic intervention in order to improve the  following deficits and impairments:  Pain,Postural dysfunction,Decreased strength,Decreased activity tolerance,Increased muscle spasms  Visit Diagnosis: Chronic bilateral low back pain with left-sided sciatica  Pain in thoracic spine  Muscle weakness (generalized)  Cervicalgia  Abnormal posture     Problem List Patient Active Problem List   Diagnosis Date Noted  . Occipital neuralgia of right side 09/08/2019  . Recurrent UTI 06/26/2019  . Osteoporosis 06/26/2019  . IBS (irritable bowel syndrome) 06/26/2019  . Hyperlipidemia 06/26/2019  . Back pain 06/26/2019  . Barrett's esophagus 06/26/2019    Carney Living PT PT DPT  05/31/2020, 12:42 PM  [facility] [address] [place], [place], [address] Phone: [phone]   Fax:  [phone]  Name: SHERISSE FULLILOVE MRN: [id number] Date of Birth: [birth year]

## 2020-05-31 NOTE — Addendum Note (Signed)
Addended by: Carney Living on: 05/31/2020 12:47 PM   Modules accepted: Orders

## 2020-06-02 ENCOUNTER — Encounter: Payer: Medicare Other | Admitting: Physical Therapy

## 2020-06-07 ENCOUNTER — Ambulatory Visit: Payer: Medicare Other | Admitting: Physical Therapy

## 2020-06-07 ENCOUNTER — Other Ambulatory Visit: Payer: Self-pay

## 2020-06-07 ENCOUNTER — Encounter: Payer: Self-pay | Admitting: Physical Therapy

## 2020-06-07 DIAGNOSIS — M6281 Muscle weakness (generalized): Secondary | ICD-10-CM

## 2020-06-07 DIAGNOSIS — M546 Pain in thoracic spine: Secondary | ICD-10-CM

## 2020-06-07 DIAGNOSIS — M542 Cervicalgia: Secondary | ICD-10-CM

## 2020-06-07 DIAGNOSIS — M5442 Lumbago with sciatica, left side: Secondary | ICD-10-CM | POA: Diagnosis not present

## 2020-06-07 DIAGNOSIS — R293 Abnormal posture: Secondary | ICD-10-CM

## 2020-06-07 DIAGNOSIS — G8929 Other chronic pain: Secondary | ICD-10-CM

## 2020-06-07 NOTE — Therapy (Signed)
[name], [place], [address] Phone: [phone]   Fax:  [phone]  Physical Therapy Treatment  Patient Details  Name: Carol Perez MRN: [id number] Date of Birth: Jul 25, 1940 Referring Provider (PT): Erline Levine, MD   Encounter Date: 06/07/2020   PT End of Session - 06/07/20 0847    Visit Number 6    Number of Visits 11    Date for PT Re-Evaluation 07/12/20    Authorization Type UHC Medicare, progress note by visit 10,    PT Start Time 0847    PT Stop Time 0929    PT Time Calculation (min) 42 min    Activity Tolerance Patient tolerated treatment well    Behavior During Therapy [facility] for tasks assessed/performed           Past Medical History:  Diagnosis Date  . Barrett's esophagus   . Colitis   . DDD (degenerative disc disease)   . Erroneous encounter - disregard    error   . Erroneous encounter - disregard   . Erroneous encounter - disregard   . Factor 5 Leiden mutation, heterozygous [facility])    Per [place] Patient Packet.  . Frequent UTI   . H/O echocardiogram 04/01/2019   Per [place] Patient Packet.  . Hiatal hernia    Per [place] Patient Packet.  . High grade dysplasia of Barrett's epithelium    Per [facility] New Patient Packet.  Marland Kitchen History of bladder infections    Per [facility] New Patient Packet.  Marland Kitchen History of bone density study 07/12/2018   Per [place] Patient Packet.  Marland Kitchen History of bone density study 07/12/2018   Up coming appointment 07/17/2019, Per [facility] New Patient Packet  . History of bone density study 07/17/2019  . History of endoscopy 12/17/2015   Needed Every 3 years. Scheduled for January 2021. Per [facility] New Patient Packet.   Marland Kitchen History of mammogram 04/14/2019   Per [place] Patient Packet.  Marland Kitchen History of MRI 03/01/2019   By Dr.Gulati/ Neurologist. MRI of Brain. Per [facility] New Patient Packet.  Marland Kitchen History of MRI 04/23/2019   By [facility]an [facility]ued at [facility]. Per [facility] New Patient Packet.   Marland Kitchen History of Papanicolaou smear of cervix 10/16/2011   Per [place] Patient Packet  . Hypotension    Per [facility] New Patient Packet.  . IBS (irritable bowel syndrome)    Per [place] New Patient Packet.  . Lactose intolerance   . Osteoarthritis    Per [place] New Patient Packet.  Marland Kitchen Osteopenia    Per [place] New Patient Packet.  . Osteoporosis   . Premature ventricular complex   . Raynaud's disease    Per [facility] New Patient Packet.  . Scoliosis    Per [place] New Patient Packet.    Past Surgical History:  Procedure Laterality Date  . ABDOMINAL HYSTERECTOMY  06/13/2011   By Dr[facility]Scherer at [facility]. Per [facility] New Patient Packet.  Marland Kitchen BREAST EXCISIONAL BIOPSY Left 30+ yrs ago  . COLONOSCOPY  11/23/2011   By Dr.McCune at [place] [facility]. Per [place] Patient Packet.  . COLONOSCOPY  08/26/2019  . MAMMOGRAM  04/14/2019  . pap smear  10/16/2011  . SIGMOIDOSCOPY  11/02/2014   Per [place] New Patient Packet  . TONSILLECTOMY  06/12/1948   Per [place] New Patient Packet.    There were no vitals filed for this visit.   Subjective Assessment - 06/07/20 0850    Subjective "  iam feeling much better  since the last session. I have been able to stop and rest to calm down the pain."    Patient Stated Goals Ease back pain symptoms    Currently in Pain? Yes    Pain Score 3               [facility] PT Assessment - 06/07/20 0001      Assessment   Medical Diagnosis Lumbago with left sciatica    Referring Provider (PT) Maeola Harman, MD                         [facility] Adult PT Treatment/Exercise - 06/07/20 0001      Lumbar Exercises: Seated   Other Seated Lumbar Exercises seated on dyna-disc maintaingin anterior pelvic tilt keeping core tight, 1 x 10 holding 5 seconds      Manual Therapy   Manual therapy comments skilled palpation and monitoring of pt throughout TPDN    Soft tissue mobilization IASTM along bil thoracolumbar paraspinal    Manual Traction gentle manual traction of the  cervical spine            Trigger Point Dry Needling - 06/07/20 0001    Consent Given? Yes    Education Handout Provided Previously provided    Muscles Treated Back/Hip Erector spinae    Electrical Stimulation Performed with Dry Needling Yes    E-stim with Dry Needling Details frequency at 20, x 10 min increasing intensity intermittenly    Upper Trapezius Response Twitch reponse elicited    Erector spinae Response Twitch response elicited;Palpable increased muscle length                     PT Long Term Goals - 05/27/20 0819      PT LONG TERM GOAL #1   Title Independent with HEP    Baseline instructed at eval, will update prn    Time 6    Period Weeks    Status New      PT LONG TERM GOAL #2   Title Improve FOTO outcome measure score to 41% or less impairment    Baseline 49% limited    Time 6    Period Weeks    Status On-going      PT LONG TERM GOAL #3   Title Increase left knee and bilat. hip strength grossly 1/2 MMT grade to improve ability for stair navigation, and transfers from low seats    Baseline 4/5    Time 6    Period Weeks    Status On-going      PT LONG TERM GOAL #4   Title Perform chores at home for vacuuming, cleaning as needed with back pain decreased 40% or greater from baseline status    Baseline increased pain and difficulty with these activities    Time 6    Period Weeks    Status On-going      PT LONG TERM GOAL #5   Title Right cervical AROM = left to improve ability to turn head while driving    Baseline right 60 deg, left 65 deg    Time 6    Period Weeks    Status On-going                 Plan - 06/07/20 0911    Clinical Impression Statement pt reports doing better since the last session. Continued TPDN for R upper trap, bil thoracic paraspinals  combined with E-stim. followed DN with IASTM. continued working core and posterior chain strengthening to facilitate efficient posture.end of session she reported decreased pain,  and wanted to continue treatment until her surgery schedule at the end of January.    PT Treatment/Interventions ADLs/Self Care Home Management;Electrical Stimulation;Moist Heat;Therapeutic activities;Therapeutic exercise;Neuromuscular re-education;Patient/family education;Manual techniques;Dry needling;Taping    PT Next Visit Plan , no spinal mobs due to history osteopenia/osteoporosis, continue dry needling, postural + core and lumbopelvic strengthening and stabilization, modalities prn but no cryo due to Raynaud's    PT Home Exercise Plan Access code: [email or web address]    Consulted and Agree with Plan of Care Patient           Patient will benefit from skilled therapeutic intervention in order to improve the following deficits and impairments:  Pain,Postural dysfunction,Decreased strength,Decreased activity tolerance,Increased muscle spasms  Visit Diagnosis: Chronic bilateral low back pain with left-sided sciatica  Muscle weakness (generalized)  Pain in thoracic spine  Cervicalgia  Abnormal posture     Problem List Patient Active Problem List   Diagnosis Date Noted  . Occipital neuralgia of right side 09/08/2019  . Recurrent UTI 06/26/2019  . Osteoporosis 06/26/2019  . IBS (irritable bowel syndrome) 06/26/2019  . Hyperlipidemia 06/26/2019  . Back pain 06/26/2019  . Barrett's esophagus 06/26/2019    Starr Lake PT, DPT, LAT, ATC  06/07/20  10:06 AM      [facility] [address] [place], [place], [address] Phone: [phone]   Fax:  [phone]  Name: LAELA VOUGHT MRN: [id number] Date of Birth: 21-Oct-1940

## 2020-06-09 ENCOUNTER — Other Ambulatory Visit: Payer: Self-pay | Admitting: Internal Medicine

## 2020-06-09 NOTE — Telephone Encounter (Signed)
Patient has requested refill on medication 'Trazodone 50mg ". Last refill was 02/11/2020 with 30 tablets and 2 refills. Patient is due for refill. Medication pend and sent to PCP 04/12/2020, MD . Please Advise.

## 2020-06-15 NOTE — H&P (Signed)
Patient ID:   [id number] Patient: Carol Perez  Date of Birth: 03-21-1941 Visit Type: Office Visit   Date: 04/06/2020 09:00 AM Provider: Danae Orleans. Venetia Maxon MD   This 80 year old female presents for back pain.  HISTORY OF PRESENT ILLNESS: 1.  back pain  Ms. Ehler is a 80 year old female who was last seen in the office on 03/10/2020 to review her MRI and discuss treatment options.  Today she continues to have complaints of low back pain with a left lower extremity radiculopathy.  She reports that she feels that her legs are becoming weaker and that they "feel heavy."  Per the injection records received, her last injection was on 05/21/2018 and consisted of bilateral intra-articular facet joint injections at the L4-5 and L5-S1 levels at the [facility].  Each injection consisted of 20 mg of Depo-Medrol which was diluted with 1 mL of 0.5% bupivacaine. She reported an adverse reaction to this injection that consisted of flushed face, leg cramps, and tachycardia X 1 week s/p injection. She reported that she has experienced this with prior injections as well.  She continues to exercise multiple times a week with exercises and stretches that were given to her throughout her many stents with physical therapy.  She does report that she has had dry needling in the past which has helped more than any other intervention.  Past medical history:  Factor 5 Leidn (Heterozygous), osteopenia, premature ventricular complex  Medications: Prolia injection Q6 months      Medical/Surgical/Interim History Reviewed, no change.  Last detailed document date:02/23/2020.     Family History: Reviewed, no changes.  Last detailed document date:02/23/2020.   Social History: Reviewed, no changes. Last detailed document date: 02/23/2020.    MEDICATIONS: (added, continued or stopped this visit) Started Medication Directions Instruction Stopped  Aspirin Low Dose 81 mg tablet,delayed release take 1 tablet by  oral route  every day    biotin 5,000 mcg disintegrating tablet     calcium 1000 ORAL TABLET     Cystex Cranberry 1,937 mg-188 mg/15 mL oral liquid     gabapentin 100 mg capsule take 3 capsule by oral route 3 times every day    Imodium A-D 2 mg tablet take 2 tablet by oral route after 1st loose stool and 1 tablet (2 mg) after each next bowel movement; do not exceed 16 mg in 24hrs    methenamine hippurate 1 gram tablet take 1 tablet by oral route 2 times every day    Nephronex 900 mcg/5 mL oral liquid     One-A-Day Women VitaCraves 200 mcg chewable tablet     Premarin 0.625 mg/gram vaginal cream insert (1G)  by vaginal route  every day cyclically, 3 weeks on and 1 week off    Probiotic Colon Care 1.5 billion cell capsule     Refresh Classic (PF) 1.4 %-0.6 % eye drops in a dropperette     trazodone 50 mg tablet take 1 tablet by oral route  every day after meals    valacyclovir 500 mg tablet take 1 tablet by oral route  every day    Vitamin D3 50 mcg (2,000 unit) capsule     Xiidra 5 % eye drops in a dropperette instill 1 drop by ophthalmic route 2 times every day into both eyes approximately 12 hours apart    Zegerid OTC 20 mg-1.1 gram capsule take 1 capsule by oral route  every day on an empty stomach at least 1 hour before meals  swallowing whole with water. Do not break, chew and/or open.      ALLERGIES: Ingredient Reaction Medication Name Comment CIPROFLOXACIN    GATIFLOXACIN    LEVOFLOXACIN    MOXIFLOXACIN    NORFLOXACIN    OFLOXACIN    LATEX    METHYLISOTHIAZOLINONE    BANANA    ADHESIVE TAPE    NSAIDS (NON-STEROIDAL ANTI-INFLAMMATORY DRUG)     Reviewed, updated.    PHYSICAL EXAM:  Vitals Date Temp F BP Pulse Ht In Wt Lb BMI BSA Pain Score 04/06/2020  138/75 73 61.5 138.8 25.8  4/10   PHYSICAL EXAM Details General Level of Distress: no acute  distress Overall Appearance: normal    Cardiovascular Cardiac: regular rate and rhythm without murmur  Respiratory Lungs: clear to auscultation  Neurological Recent and Remote Memory: normal Attention Span and Concentration:   normal Language: normal Fund of Knowledge: normal  Right Left Sensation: normal normal Upper Extremity Coordination: normal normal  Lower Extremity Coordination: normal normal  Musculoskeletal Gait and Station: normal  Right Left Upper Extremity Muscle Strength: normal normal Lower Extremity Muscle Strength: normal normal Upper Extremity Muscle Tone:  normal normal Lower Extremity Muscle Tone: normal normal   Motor Strength Upper and lower extremity motor strength was tested in the clinically pertinent muscles. Any abnormal findings will be noted below.   Right Left EHL:  4/5   Deep Tendon Reflexes  Right Left Biceps: normal normal Triceps: normal normal Brachioradialis: normal normal Patellar: normal normal Achilles: normal normal  Sensory Sensation was tested at L1 to S1. Any abnormal findings will be noted below.  Right Left L5:  decreased   Cranial Nerves II. Optic Nerve/Visual Fields: normal III. Oculomotor: normal IV. Trochlear: normal V. Trigeminal: normal VI. Abducens: normal VII. Facial: normal VIII. Acoustic/Vestibular: normal IX. Glossopharyngeal: normal X. Vagus: normal XI. Spinal Accessory: normal XII. Hypoglossal: normal  Motor and other Tests Lhermittes: negative Rhomberg: negative    Right Left Hoffman's: normal normal Clonus: normal normal Babinski: normal normal SLR: negative negative Patrick's Pearlean Brownie): negative negative Toe Walk: normal normal Toe Lift: normal normal Heel Walk: normal normal SI Joint: nontender nontender      IMPRESSION:  The patient continues to have severe low back pain with a left lower extremity radiculopathy.  On exam she has a left EHL 4/5 and decreased sensation in the  L5 distribution.  Her symptoms are greatly reducing her quality of life and would like to move forward with options to resolve her symptoms.  She has received multiple injections with no significant change in her symptoms and has had adverse reactions to multiple different steroids.  Do not believe injections would be beneficial to the patient, nor do I believe subjecting her to the adverse reactions that the steroids have caused her in the past is of benefit to her.  She expressed interest in receiving dry needling as it helped significantly in the past.  We will proceed with planned for an ALIF at the L5-S1 level with an XLIF at L4-5, L3-4, L2-3 levels worth percutaneous pedicle screw fixation at the L2 through S1 levels.  PLAN: We will send the patient for dry needling.  We discussed the patient undergoing a ALIF at the L5-S1 level with an XLIF at L4-5, L3-4, L2-3 levels worth percutaneous pedicle screw fixation at the L2 through S1 levels.  She will need a consult with the vascular surgeon.  She would like to proceed with surgery.  She will follow-up in the clinic due discussed  plan for surgery.  Orders: Instruction(s)/Education: Assessment Instruction R03.0 Lifestyle education [phone] Dietary management education, guidance, and counseling  Completed Orders (this encounter) Order Details Reason Side Interpretation Result Initial Treatment Date Region Lifestyle education Patient will monitor and contact primary care physician if needed.       Dietary management education, guidance, and counseling Encouraged patient to eat well balanced diet.        Assessment/Plan  # Detail Type Description  1. Assessment Other idiopathic scoliosis, unspecified spinal region (M41.20).     2. Assessment Disc displacement, lumbar (M51.26).     3. Assessment Lumbar stenosis without neurogenic claudication (M48.061).     4. Assessment Disc degeneration, lumbar  (M51.36).     5. Assessment Lumbago with sciatica, left side (M54.42).     6. Assessment Elevated blood-pressure reading, w/o diagnosis of htn (R03.0).     7. Assessment Body mass index (BMI) 25.0-25.9, adult (R74.08).  Plan Orders Today's instructions / counseling include(s) Dietary management education, guidance, and counseling. Clinical information/comments: Encouraged patient to eat well balanced diet.       Pain Management Plan Pain Scale: 4/10. Method: Numeric Pain Intensity Scale. Location: back. Onset: 03/10/2020. Duration: varies. Quality: discomforting. Pain management follow-up plan of care: Patient will continue medication management..              Provider:  Danae Orleans. Venetia Maxon MD  04/09/2020 09:38 AM  Under the supervision of Dorian Heckle MD    Dictation edited by: Benita Gutter, NP    CC Providers: Einar Crow [address] [place],  [place]  [address]-   Einar Crow  [address] [place], [place] [address]-               Electronically signed by Benita Gutter NP on 04/09/2020 09:38 AM  on behalf of Danae Orleans. Venetia Maxon MD

## 2020-06-16 ENCOUNTER — Other Ambulatory Visit: Payer: Self-pay

## 2020-06-21 ENCOUNTER — Encounter: Payer: Medicare Other | Admitting: Surgery

## 2020-06-24 ENCOUNTER — Ambulatory Visit: Payer: Medicare Other | Admitting: Physical Therapy

## 2020-06-30 ENCOUNTER — Ambulatory Visit: Payer: Medicare Other | Attending: Neurosurgery | Admitting: Physical Therapy

## 2020-06-30 ENCOUNTER — Encounter: Payer: Self-pay | Admitting: Physical Therapy

## 2020-06-30 ENCOUNTER — Other Ambulatory Visit: Payer: Self-pay

## 2020-06-30 DIAGNOSIS — M6281 Muscle weakness (generalized): Secondary | ICD-10-CM | POA: Diagnosis present

## 2020-06-30 DIAGNOSIS — M5442 Lumbago with sciatica, left side: Secondary | ICD-10-CM | POA: Insufficient documentation

## 2020-06-30 DIAGNOSIS — M546 Pain in thoracic spine: Secondary | ICD-10-CM | POA: Diagnosis present

## 2020-06-30 DIAGNOSIS — R293 Abnormal posture: Secondary | ICD-10-CM | POA: Diagnosis present

## 2020-06-30 DIAGNOSIS — G8929 Other chronic pain: Secondary | ICD-10-CM | POA: Insufficient documentation

## 2020-06-30 DIAGNOSIS — M542 Cervicalgia: Secondary | ICD-10-CM | POA: Insufficient documentation

## 2020-06-30 NOTE — Therapy (Signed)
[place], [place], [address] Phone: [phone]   Fax:  [phone]  Physical Therapy Treatment/Discharge  Patient Details  Name: Carol Perez MRN: [id number] Date of Birth: [birth year] Referring Provider (PT): Erline Levine, MD   Encounter Date: 06/30/2020   PT End of Session - 06/30/20 1353    Visit Number 7    Number of Visits 11    Date for PT Re-Evaluation 07/12/20    Authorization Type [employer] Medicare, progress note by visit 10,    PT Start Time 0930    PT Stop Time 1010    PT Time Calculation (min) 40 min    Activity Tolerance Patient tolerated treatment well    Behavior During Therapy [facility] for tasks assessed/performed            Past Medical History:  Diagnosis Date  . Barrett's esophagus   . Colitis   . DDD (degenerative disc disease)   . Erroneous encounter - disregard    error   . Erroneous encounter - disregard   . Erroneous encounter - disregard   . Factor 5 Leiden mutation, heterozygous [facility])    Per [place] Patient Packet.  . Frequent UTI   . H/O echocardiogram 04/01/2019   Per [place] Patient Packet.  . Hiatal hernia    Per [place] Patient Packet.  . High grade dysplasia of Barrett's epithelium    Per [facility] New Patient Packet.  Marland Kitchen History of bladder infections    Per [facility] New Patient Packet.  Marland Kitchen History of bone density study 07/12/2018   Per [place] Patient Packet.  Marland Kitchen History of bone density study 07/12/2018   Up coming appointment 07/17/2019, Per [facility] New Patient Packet  . History of bone density study 07/17/2019  . History of endoscopy 12/17/2015   Needed Every 3 years. Scheduled for January 2021. Per [facility] New Patient Packet.   Marland Kitchen History of mammogram 04/14/2019   Per [place] Patient Packet.  Marland Kitchen History of MRI 03/01/2019   By Dr.Gulati/ Neurologist. MRI of Brain. Per [facility] New Patient Packet.  Marland Kitchen History of MRI 04/23/2019   By Dr. Maryan Rued at [facility]. Per [facility] New Patient  Packet.  Marland Kitchen History of Papanicolaou smear of cervix 10/16/2011   Per [place] Patient Packet  . Hypotension    Per [facility] New Patient Packet.  . IBS (irritable bowel syndrome)    Per [place] New Patient Packet.  . Lactose intolerance   . Osteoarthritis    Per [facility] New Patient Packet.  Marland Kitchen Osteopenia    Per [place] New Patient Packet.  . Osteoporosis   . Premature ventricular complex   . Raynaud's disease    Per [facility] New Patient Packet.  . Scoliosis    Per [place] New Patient Packet.    Past Surgical History:  Procedure Laterality Date  . ABDOMINAL HYSTERECTOMY  06/13/2011   By Dr.Scherer at [facility]. Per [facility] New Patient Packet.  Marland Kitchen BREAST EXCISIONAL BIOPSY Left 30+ yrs ago  . COLONOSCOPY  11/23/2011   By Dr.McCune at [facility]. Per [place] Patient Packet.  . COLONOSCOPY  08/26/2019  . MAMMOGRAM  04/14/2019  . pap smear  10/16/2011  . SIGMOIDOSCOPY  11/02/2014   Per [place] New Patient Packet  . TONSILLECTOMY  06/12/1948   Per [place] New Patient Packet.    There were no vitals filed for this visit.   Subjective Assessment - 06/30/20 1350  Subjective Patient reports her upper back is feeling better. She is having significant pain in her lower back. She is also having a little tightness in her upper trap.    Pertinent History chronic back and neck pain, scoliosis, DDD, osteopenia/osteoporosis, Raynaud's    Limitations Lifting;Sitting;House hold activities    Diagnostic tests past MRI and X-rays    Patient Stated Goals Ease back pain symptoms    Currently in Pain? No/denies    Pain Score 3     Pain Location Back    Pain Orientation Right    Pain Descriptors / Indicators Aching    Pain Type Chronic pain    Pain Onset More than a month ago    Pain Frequency Constant    Aggravating Factors  sleeping on the couch    Pain Relieving Factors rest    Multiple Pain Sites No                             [facility] Adult PT Treatment/Exercise - 06/30/20 0001       Self-Care   Self-Care Other Self-Care Comments    Other Self-Care Comments  how to use HEP at home. Advised to follow MD instructions after surgery            Trigger Point Dry Needling - 06/30/20 0001    Consent Given? Yes    Education Handout Provided Previously provided    Electrical Stimulation Performed with Dry Needling Yes    E-stim with Dry Needling Details frequency at 20, x 10 min increasing intensity intermittenly    Upper Trapezius Response Twitch reponse elicited    Erector spinae Response Twitch response elicited;Palpable increased muscle length                PT Education - 06/30/20 1352    Education Details reviewed how to use stretches and exercises at home.    Person(s) Educated Patient    Methods Explanation;Demonstration;Tactile cues;Verbal cues    Comprehension Verbalized understanding;Returned demonstration;Verbal cues required;Tactile cues required               PT Long Term Goals - 06/30/20 1408      PT LONG TERM GOAL #1   Title Independent with HEP    Baseline independnet    Time 6    Period Weeks    Status Achieved      PT LONG TERM GOAL #2   Title Improve FOTO outcome measure score to 41% or less impairment    Baseline 49% limited    Time 6    Period Weeks    Status Achieved      PT LONG TERM GOAL #3   Title Increase left knee and bilat. hip strength grossly 1/2 MMT grade to improve ability for stair navigation, and transfers from low seats    Baseline 4/5    Time 6    Period Weeks    Status Not Met      PT LONG TERM GOAL #4   Title Perform chores at home for vacuuming, cleaning as needed with back pain decreased 40% or greater from baseline status    Baseline improved pain from baseline in area treated    Time 6    Period Weeks    Status Achieved      PT LONG TERM GOAL #5   Title Right cervical AROM = left to improve ability to turn head while driving  Baseline right 60 deg, left 65 deg    Time 6    Period Weeks     Status Achieved                 Plan - 06/30/20 1357    Clinical Impression Statement Patient is having surgery next week. Her upper back pain has improved. Therapy perfromed needling and soft tissue mobilization to the upper trap, and throacic spine. She will discharge home to HEP. Therapy advised her to follow MD recocmendations folloing surgeyr but she can continue with light stretching and exercises until then. Patient has reached max postential for therapy until surgery. See goal specific progress below.    Personal Factors and Comorbidities Age;Comorbidity 3+;Time since onset of injury/illness/exacerbation    Examination-Activity Limitations Lift;Carry;Bend    Examination-Participation Restrictions Cleaning    Stability/Clinical Decision Making Evolving/Moderate complexity    Clinical Decision Making Moderate    Rehab Potential Good    PT Duration 6 weeks    PT Treatment/Interventions ADLs/Self Care Home Management;Electrical Stimulation;Moist Heat;Therapeutic activities;Therapeutic exercise;Neuromuscular re-education;Patient/family education;Manual techniques;Dry needling;Taping    PT Next Visit Plan , no spinal mobs due to history osteopenia/osteoporosis, continue dry needling, postural + core and lumbopelvic strengthening and stabilization, modalities prn but no cryo due to Raynaud's    PT Home Exercise Plan Access code: [email or web address]    Consulted and Agree with Plan of Care Patient           Patient will benefit from skilled therapeutic intervention in order to improve the following deficits and impairments:  Pain,Postural dysfunction,Decreased strength,Decreased activity tolerance,Increased muscle spasms  Visit Diagnosis: Chronic bilateral low back pain with left-sided sciatica  Muscle weakness (generalized)  Pain in thoracic spine  Cervicalgia  Abnormal posture    PHYSICAL THERAPY DISCHARGE SUMMARY  Visits from Start of Care: 7  Current functional level  related to goals / functional outcomes: Improved upper back pain; still has lower back pain    Remaining deficits: Pain in lower back   Education / Equipment: HEP   Plan: Patient agrees to discharge.  Patient goals were partially met. Patient is being discharged due to being pleased with the current functional level.  ?????      Problem List Patient Active Problem List   Diagnosis Date Noted  . Occipital neuralgia of right side 09/08/2019  . Recurrent UTI 06/26/2019  . Osteoporosis 06/26/2019  . IBS (irritable bowel syndrome) 06/26/2019  . Hyperlipidemia 06/26/2019  . Back pain 06/26/2019  . Barrett's esophagus 06/26/2019    Carney Living  PT DPT  06/30/2020, 3:12 PM  [facility] [address] [place], [place], [address] Phone: [phone]   Fax:  [phone]  Name: Carol Perez MRN: [id number] Date of Birth: [birth year]

## 2020-07-05 ENCOUNTER — Encounter: Payer: Self-pay | Admitting: Surgery

## 2020-07-05 ENCOUNTER — Other Ambulatory Visit: Payer: Self-pay

## 2020-07-05 ENCOUNTER — Ambulatory Visit: Payer: Medicare Other | Admitting: Surgery

## 2020-07-05 VITALS — BP 116/69 | HR 72 | Temp 97.2°F | Resp 16 | Ht 62.0 in | Wt 131.0 lb

## 2020-07-05 DIAGNOSIS — M479 Spondylosis, unspecified: Secondary | ICD-10-CM | POA: Diagnosis not present

## 2020-07-05 NOTE — Progress Notes (Signed)
 [facility] [place]  Patient name: Carol Perez  MRN: [id number]        DOB: 01/18/1941            Sex: female   REQUESTING PROVIDER:    Dr. Stern   REASON FOR CONSULT:    Anterior exposure  HISTORY OF PRESENT ILLNESS:   Carol Perez is a 79 y.o. female, who is referrer for evaluation of anterior exposure of L5-S1 for spinal instrumentation.  She has a left leg radiculopathy.  She has failed nonoperative management and is being considered for surgical intervention.  She has not had abdominal surgery.  She did have a transvaginal hysterectomy.  She is a former smoker.  The patient states that she suffers from Raynaud's disease.  PAST MEDICAL HISTORY        Past Medical History:  Diagnosis Date  . Barrett's esophagus   . Colitis   . DDD (degenerative disc disease)   . Erroneous encounter - disregard    error   . Erroneous encounter - disregard   . Erroneous encounter - disregard   . Factor 5 Leiden mutation, heterozygous (HCC)    Per [facility] New Patient Packet.  . Frequent UTI   . H/O echocardiogram 04/01/2019   Per [facility].  . Hiatal hernia    Per [facility].  . High grade dysplasia of Barrett's epithelium    Per [facility].  . History of bladder infections    Per [facility].  . History of bone density study 07/12/2018   Per [facility].  . History of bone density study 07/12/2018   Up coming appointment 07/17/2019, Per [facility] Packet  . History of bone density study 07/17/2019  . History of endoscopy 12/17/2015   Needed Every 3 years. Scheduled for January 2021. Per [facility] Patient Packet.   . History of mammogram 04/14/2019   Per [facility].  . History of MRI 03/01/2019   By Dr.Gulati/ Neurologist. MRI of Brain. Per [facility] Packet.  . History of MRI 04/23/2019   By Dr. Plunkett at [facility]. Per [facility]  Patient Packet.  . History of Papanicolaou smear of cervix 10/16/2011   Per [facility]  . Hypotension    Per [facility].  . IBS (irritable bowel syndrome)    Per [facility].  . Lactose intolerance   . Osteoarthritis    Per [facility].  . Osteopenia    Per [facility].  . Osteoporosis   . Premature ventricular complex   . Raynaud's disease    Per [facility].  . Scoliosis    Per [facility] Packet.     FAMILY HISTORY        Family History  Problem Relation Age of Onset  . Cancer Mother   . Cancer Father     SOCIAL HISTORY:   Social History        Socioeconomic History  . Marital status: Married    Spouse name: Not on file  . Number of children: 2  . Years of education: Not on file  . Highest education level: Associate degree: occupational, technical, or vocational program  Occupational History  . Occupation: Retired   Tobacco Use  . Smoking status: Former Smoker    Start date: 06/12/1977  . Smokeless   tobacco: Never Used  . Tobacco comment: 42 years ago. Smoked from age [age]  Vaping Use  . Vaping Use: Never used  Substance and Sexual Activity  . Alcohol use: Not Currently    Alcohol/week: 7.0 standard drinks    Types: 7 Glasses of wine per week    Comment: was a glass of wine daily  . Drug use: No  . Sexual activity: Not on file  Other Topics Concern  . Not on file  Social History Narrative   Tobacco use, amount per day now: No   Past tobacco use, amount per day: Smoked ages [age]   How many years did you use tobacco: 17-20 years   Alcohol use (drinks per week): 1 glass of Red Wine per day.   Diet: Good   Do you drink/eat things with caffeine: No   Marital status: Married                                  What year were you married? 1978   Do you live in a house, apartment, assisted living, condo, trailer, etc.? Apartment/Independent  Living   Is it one or more stories? 1   How many persons live in your home? 2    Do you have pets in your home?( please list) No   Highest Level of Education completed? 2 years of college.    Current or past profession: Retired Interior Designer   Do you exercise?  Yes                                Type and how often? 30min workout 5 days week. Walking 3-4 Days a Week.    Do you have a living will? Yes   Do you have a DNR form?  Yes                                 If not, do you want to discuss one?   Do you have signed POA/HPOA forms? Yes                       If so, please bring to you appointment      Do you have difficulty bathing or dressing yourself? No   Do you have difficulty preparing food or eating? No   Do you have difficulty managing your medications? No   Do you have difficulty managing your finances? No   Do you have difficulty affording your medications? No       Per [facility] New Patient Packet. Abstracted by Jasmine/RMA.    Social Determinants of Health   Financial Resource Strain: Not on file  Food Insecurity: Not on file  Transportation Needs: Not on file  Physical Activity: Not on file  Stress: Not on file  Social Connections: Not on file  Intimate Partner Violence: Not on file    ALLERGIES:         Allergies  Allergen Reactions  . Aspirin Other (See Comments)    Upsets ulcers  . Avelox [Moxifloxacin]   . Banana     Swelling around mouth and eyes and red blotches  . Ciprofloxacin Hcl   . Levofloxacin   . Norfloxacin   . Nsaids Other (See Comments)    Upsets ulcers  .   Ofloxacin   . Other     FLOXIN  . Penicillins Hives  . Tape   . Tequin [Gatifloxacin]   . Latex Swelling and Rash  . Methylisothiazolinone Rash    CURRENT MEDICATIONS:          Current Outpatient Medications  Medication Sig Dispense Refill  . aspirin EC 81 MG tablet Take 81 mg by mouth in the morning and at bedtime.     . B  Complex Vitamins (VITAMIN B-COMPLEX) TABS Take 1 tablet by mouth daily. With vitamin C    . Biotin 5000 MCG TABS Take 1 tablet by mouth daily.    . CALCIUM PO Take 1,200 mg by mouth daily.     . Cholecalciferol (D3-1000 PO) Take by mouth daily. 2,000 IU    . conjugated estrogens (PREMARIN) vaginal cream Place 1 Applicatorful vaginally 3 (three) times a week. 42.5 g 12  . gabapentin (NEURONTIN) 100 MG capsule Take 100 mg by mouth daily as needed for pain.    . Lifitegrast (XIIDRA OP) Apply 1 drop to eye 2 (two) times daily.     . loperamide (IMODIUM) 2 MG capsule Take 2 mg by mouth as needed for diarrhea or loose stools.    . methenamine (HIPREX) 1 g tablet Take 1 g by mouth 2 (two) times daily with a meal. For UTI Prevention. Last UTI: October 20th, November 5th, and December 28th.     . Methenamine-Sodium Salicylate (CYSTEX PO) Take 100 mg by mouth daily. ( Cranberry Concentrate )    . Multiple Vitamins-Minerals (ONE-A-DAY WOMENS PO) Take 1 tablet by mouth daily.    . NON FORMULARY CBD Full spectrum 1 ml as needed for sleep.    . Omeprazole-Sodium Bicarbonate (ZEGERID PO) Take 20 mg by mouth 2 (two) times daily.    . Polyvinyl Alcohol-Povidone (REFRESH OP) Apply 1 drop to eye as needed.    . Probiotic Product (PROBIOTIC COLON SUPPORT PO) Take 1 tablet by mouth daily.    . traZODone (DESYREL) 50 MG tablet TAKE 1/2 TO 1 TABLET AT BEDTIME AS NEEDED. 30 tablet 0  . valACYclovir (VALTREX) 500 MG tablet TAKE (1) TABLET DAILY AS NEEDED. 90 tablet 3   No current facility-administered medications for this visit.    REVIEW OF SYSTEMS:   [X] denotes positive finding, [ ] denotes negative finding Cardiac  Comments:  Chest pain or chest pressure:    Shortness of breath upon exertion:    Short of breath when lying flat:    Irregular heart rhythm:        Vascular    Pain in calf, thigh, or hip brought on by ambulation:    Pain in feet at night that  wakes you up from your sleep:     Blood clot in your veins:    Leg swelling:         Pulmonary    Oxygen at home:    Productive cough:     Wheezing:         Neurologic    Sudden weakness in arms or legs:     Sudden numbness in arms or legs:     Sudden onset of difficulty speaking or slurred speech:    Temporary loss of vision in one eye:     Problems with dizziness:         Gastrointestinal    Blood in stool:      Vomited blood:         Genitourinary      Burning when urinating:     Blood in urine:        Psychiatric    Major depression:         Hematologic    Bleeding problems:    Problems with blood clotting too easily:        Skin    Rashes or ulcers:        Constitutional    Fever or chills:     PHYSICAL EXAM:   There were no vitals filed for this visit.  GENERAL: The patient is a well-nourished female, in no acute distress. The vital signs are documented above. CARDIAC: There is a regular rate and rhythm.  VASCULAR: Palpable pedal pulses PULMONARY: Nonlabored respirations ABDOMEN: Soft and non-tender  MUSCULOSKELETAL: There are no major deformities or cyanosis. NEUROLOGIC: No focal weakness or paresthesias are detected. SKIN: There are no ulcers or rashes noted. PSYCHIATRIC: The patient has a normal affect.  STUDIES:   I have reviewed her MRI detailing her lower back disease.  ASSESSMENT and PLAN   Degenerative back disease: I discussed my role in a anterior exposure for L5-S1 instrumentation.  We discussed potential risk including injury to the iliac artery or vein as well as the ureter.  We also discussed the risk of hernia and wound infection.  All of their questions were answered.   Wells Dencil Cayson, IV, MD, FACS [facility] [place] Tel [phone] Pager [phone]  

## 2020-07-07 ENCOUNTER — Other Ambulatory Visit: Payer: Self-pay | Admitting: Internal Medicine

## 2020-07-07 ENCOUNTER — Ambulatory Visit: Payer: Medicare Other | Admitting: Physical Therapy

## 2020-07-12 ENCOUNTER — Encounter: Payer: Medicare Other | Admitting: Physical Therapy

## 2020-08-05 ENCOUNTER — Other Ambulatory Visit: Payer: Self-pay | Admitting: Internal Medicine

## 2020-08-05 NOTE — Telephone Encounter (Signed)
Patient has request refill on medication "Trazodone 50 mg". Patient last refill was 07/07/2020. Medication pend and sent to PCP Virgie Dad, MD . Please Advise.

## 2020-08-09 ENCOUNTER — Other Ambulatory Visit (HOSPITAL_COMMUNITY)
Admission: RE | Admit: 2020-08-09 | Discharge: 2020-08-09 | Disposition: A | Discharge status: Home / Self Care | Payer: Medicare Other | Source: Ambulatory Visit | Attending: Neurosurgery | Admitting: Neurosurgery

## 2020-08-09 ENCOUNTER — Encounter (HOSPITAL_COMMUNITY)
Admission: RE | Admit: 2020-08-09 | Discharge: 2020-08-09 | Disposition: A | Discharge status: Home / Self Care | Payer: Medicare Other | Source: Ambulatory Visit | Attending: Neurosurgery | Admitting: Neurosurgery

## 2020-08-09 ENCOUNTER — Other Ambulatory Visit: Payer: Self-pay

## 2020-08-09 ENCOUNTER — Encounter (HOSPITAL_COMMUNITY): Payer: Self-pay

## 2020-08-09 DIAGNOSIS — Z01818 Encounter for other preprocedural examination: Secondary | ICD-10-CM | POA: Insufficient documentation

## 2020-08-09 DIAGNOSIS — Z20822 Contact with and (suspected) exposure to covid-19: Secondary | ICD-10-CM | POA: Insufficient documentation

## 2020-08-09 DIAGNOSIS — I10 Essential (primary) hypertension: Secondary | ICD-10-CM | POA: Insufficient documentation

## 2020-08-09 DIAGNOSIS — Z01812 Encounter for preprocedural laboratory examination: Secondary | ICD-10-CM | POA: Insufficient documentation

## 2020-08-09 LAB — BASIC METABOLIC PANEL
Anion gap: 11 (ref 5–15)
BUN: 7 mg/dL — ABNORMAL LOW (ref 8–23)
CO2: 28 mmol/L (ref 22–32)
Calcium: 9.1 mg/dL (ref 8.9–10.3)
Chloride: 99 mmol/L (ref 98–111)
Creatinine, Ser: 0.76 mg/dL (ref 0.44–1.00)
GFR, Estimated: >60 mL/min (ref >60)
Glucose, Bld: 91 mg/dL (ref 70–99)
Potassium: 4.1 mmol/L (ref 3.5–5.1)
Sodium: 138 mmol/L (ref 135–145)

## 2020-08-09 LAB — CBC
HCT: 37.3 % (ref 36.0–46.0)
Hemoglobin: 12.4 g/dL (ref 12.0–15.0)
MCH: 33.4 pg (ref 26.0–34.0)
MCHC: 33.2 g/dL (ref 30.0–36.0)
MCV: 100.5 fL — ABNORMAL HIGH (ref 80.0–100.0)
Platelets: 232 10*3/uL (ref 150–400)
RBC: 3.71 MIL/uL — ABNORMAL LOW (ref 3.87–5.11)
RDW: 12.7 % (ref 11.5–15.5)
WBC: 6.8 10*3/uL (ref 4.0–10.5)
nRBC: 0 % (ref 0.0–0.2)

## 2020-08-09 LAB — TYPE AND SCREEN
ABO/RH(D): O POS
Antibody Screen: NEGATIVE

## 2020-08-09 LAB — SURGICAL PCR SCREEN
MRSA, PCR: NEGATIVE
Staphylococcus aureus: NEGATIVE

## 2020-08-09 NOTE — Progress Notes (Signed)
PCP - Dr. Veleta Miners Cardiologist - denies  Chest x-ray - n/a EKG - 08/09/20 Stress Test - 30+ years ago ECHO - 03/22/19 Cardiac Cath - denies  Sleep Study - denies CPAP - denies  Blood Thinner Instructions:n/a Aspirin Instructions: LD 08/06/20  COVID TEST- 08/09/20; pt aware of quarantine guidelines   Anesthesia review: Yes, EKG review.  Patient denies shortness of breath, fever, cough and chest pain at PAT appointment   All instructions explained to the patient, with a verbal understanding of the material. Patient agrees to go over the instructions while at home for a better understanding. Patient also instructed to self quarantine after being tested for COVID-19. The opportunity to ask questions was provided.

## 2020-08-09 NOTE — Pre-Procedure Instructions (Signed)
Surgical Instructions    Your procedure is scheduled on Thursday, March 3rd.  Report to [facility] Main Entrance "A" at 5:30 A.M., then check in with the Admitting office.  Call this number if you have problems the morning of surgery:  [phone]   If you have any questions prior to your surgery date call [phone]: Open Monday-Friday 8am-4pm    Remember:  Do not eat or drink after midnight the night before your surgery    Take these medicines the morning of surgery with A SIP OF WATER  Omeprazole-Sodium Bicarbonate (ZEGERID PO) Polyethyl Glycol-Propyl Glycol (SYSTANE)-use as needed for dry eyes valACYclovir (VALTREX)-use as needed.  Follow your surgeon's instructions on when to stop Aspirin.  If no instructions were given by your surgeon then you will need to call the office to get those instructions.    As of today, STOP taking any Aspirin (unless otherwise instructed by your surgeon) Aleve, Naproxen, Ibuprofen, Motrin, Advil, Goody's, BC's, all herbal medications, fish oil, and all vitamins.                     Do not wear jewelry, make up, or nail polish            Do not wear lotions, powders, perfumes, or deodorant.            Do not shave 48 hours prior to surgery.            Do not bring valuables to the hospital.            [facility] is not responsible for any belongings or valuables.  Do NOT Smoke (Tobacco/Vaping) or drink Alcohol 24 hours prior to your procedure If you use a CPAP at night, you may bring all equipment for your overnight stay.   Contacts, glasses, dentures or bridgework may not be worn into surgery, please bring cases for these belongings   For patients admitted to the hospital, discharge time will be determined by your treatment team.   Patients discharged the day of surgery will not be allowed to drive home, and someone needs to stay with them for 24 hours.    Special instructions:   [facility]- Preparing For Surgery  Before surgery, you  can play an important role. Because skin is not sterile, your skin needs to be as free of germs as possible. You can reduce the number of germs on your skin by washing with CHG (chlorahexidine gluconate) Soap before surgery.  CHG is an antiseptic cleaner which kills germs and bonds with the skin to continue killing germs even after washing.    Oral Hygiene is also important to reduce your risk of infection.  Remember - BRUSH YOUR TEETH THE MORNING OF SURGERY WITH YOUR REGULAR TOOTHPASTE  Please do not use if you have an allergy to CHG or antibacterial soaps. If your skin becomes reddened/irritated stop using the CHG.  Do not shave (including legs and underarms) for at least 48 hours prior to first CHG shower. It is OK to shave your face.  Please follow these instructions carefully.   1. Shower the NIGHT BEFORE SURGERY and the MORNING OF SURGERY  2. If you chose to wash your hair, wash your hair first as usual with your normal shampoo.  3. After you shampoo, rinse your hair and body thoroughly to remove the shampoo.  4. Use CHG Soap as you would any other liquid soap. You can apply CHG directly to the skin and  wash gently with a scrungie or a clean washcloth.   5. Apply the CHG Soap to your body ONLY FROM THE NECK DOWN.  Do not use on open wounds or open sores. Avoid contact with your eyes, ears, mouth and genitals (private parts). Wash Face and genitals (private parts)  with your normal soap.   6. Wash thoroughly, paying special attention to the area where your surgery will be performed.  7. Thoroughly rinse your body with warm water from the neck down.  8. DO NOT shower/wash with your normal soap after using and rinsing off the CHG Soap.  9. Pat yourself dry with a CLEAN TOWEL.  10. Wear CLEAN PAJAMAS to bed the night before surgery  11. Place CLEAN SHEETS on your bed the night before your surgery  12. DO NOT SLEEP WITH PETS.   Day of Surgery: Wear Clean/Comfortable clothing the  morning of surgery Do not apply any deodorants/lotions.   Remember to brush your teeth WITH YOUR REGULAR TOOTHPASTE.   Please read over the following fact sheets that you were given.

## 2020-08-10 LAB — SARS CORONAVIRUS 2 (TAT 6-24 HRS): SARS Coronavirus 2: NEGATIVE

## 2020-08-10 NOTE — Anesthesia Preprocedure Evaluation (Addendum)
Anesthesia Evaluation  Patient identified by MRN, date of birth, ID band Patient awake    Reviewed: Allergy & Precautions, NPO status , Patient's Chart, lab work & pertinent test results  Airway Mallampati: II  TM Distance: >3 FB     Dental   Pulmonary former smoker,    breath sounds clear to auscultation       Cardiovascular negative cardio ROS   Rhythm:Regular Rate:Normal     Neuro/Psych    GI/Hepatic Neg liver ROS, hiatal hernia,   Endo/Other  negative endocrine ROS  Renal/GU negative Renal ROS     Musculoskeletal  (+) Arthritis ,   Abdominal   Peds  Hematology   Anesthesia Other Findings   Reproductive/Obstetrics                           Anesthesia Physical Anesthesia Plan  ASA: III  Anesthesia Plan: General   Post-op Pain Management:    Induction: Intravenous  PONV Risk Score and Plan: 3 and Ondansetron, Dexamethasone and Midazolam  Airway Management Planned: Oral ETT  Additional Equipment: Arterial line  Intra-op Plan:   Post-operative Plan: Possible Post-op intubation/ventilation  Informed Consent: I have reviewed the patients History and Physical, chart, labs and discussed the procedure including the risks, benefits and alternatives for the proposed anesthesia with the patient or authorized representative who has indicated his/her understanding and acceptance.   Patient has DNR.  Continue DNR.   Dental advisory given  Plan Discussed with: CRNA and Anesthesiologist  Anesthesia Plan Comments: (TTE 04/01/19 (care everywhere): SUMMARY  The left ventricular size is normal.  There is normal left ventricular wall thickness.   Left ventricular systolic function is normal.  The left ventricular wall motion is normal.  The right ventricle is normal size.  The right ventricular systolic function is normal.  The left atrial size is normal.   Right atrial size is  normal.  There is no pericardial effusion.  )     Anesthesia Quick Evaluation

## 2020-08-12 ENCOUNTER — Other Ambulatory Visit: Payer: Self-pay

## 2020-08-12 ENCOUNTER — Encounter (HOSPITAL_COMMUNITY)
Admission: RE | Disposition: A | Discharge status: Skilled Nursing Facility | Payer: Self-pay | Source: Home / Self Care | Attending: Neurosurgery

## 2020-08-12 ENCOUNTER — Inpatient Hospital Stay (HOSPITAL_COMMUNITY): Payer: Medicare Other | Admitting: Certified Registered"

## 2020-08-12 ENCOUNTER — Inpatient Hospital Stay (HOSPITAL_COMMUNITY): Payer: Medicare Other

## 2020-08-12 ENCOUNTER — Inpatient Hospital Stay (HOSPITAL_COMMUNITY)
Admission: RE | Admit: 2020-08-12 | Discharge: 2020-08-24 | DRG: 459 | Disposition: A | Discharge status: Skilled Nursing Facility | Payer: Medicare Other | Attending: Neurosurgery | Admitting: Neurosurgery

## 2020-08-12 ENCOUNTER — Encounter (HOSPITAL_COMMUNITY): Payer: Self-pay | Admitting: Neurosurgery

## 2020-08-12 ENCOUNTER — Inpatient Hospital Stay (HOSPITAL_COMMUNITY): Payer: Medicare Other | Admitting: Physician Assistant

## 2020-08-12 DIAGNOSIS — Z419 Encounter for procedure for purposes other than remedying health state, unspecified: Secondary | ICD-10-CM

## 2020-08-12 DIAGNOSIS — Z79899 Other long term (current) drug therapy: Secondary | ICD-10-CM | POA: Diagnosis not present

## 2020-08-12 DIAGNOSIS — F339 Major depressive disorder, recurrent, unspecified: Secondary | ICD-10-CM | POA: Diagnosis not present

## 2020-08-12 DIAGNOSIS — Z20822 Contact with and (suspected) exposure to covid-19: Secondary | ICD-10-CM | POA: Diagnosis present

## 2020-08-12 DIAGNOSIS — M5416 Radiculopathy, lumbar region: Secondary | ICD-10-CM | POA: Diagnosis not present

## 2020-08-12 DIAGNOSIS — R0789 Other chest pain: Secondary | ICD-10-CM | POA: Diagnosis not present

## 2020-08-12 DIAGNOSIS — Z881 Allergy status to other antibiotic agents status: Secondary | ICD-10-CM | POA: Diagnosis not present

## 2020-08-12 DIAGNOSIS — Z880 Allergy status to penicillin: Secondary | ICD-10-CM

## 2020-08-12 DIAGNOSIS — M5116 Intervertebral disc disorders with radiculopathy, lumbar region: Secondary | ICD-10-CM | POA: Diagnosis present

## 2020-08-12 DIAGNOSIS — D6851 Activated protein C resistance: Secondary | ICD-10-CM | POA: Diagnosis present

## 2020-08-12 DIAGNOSIS — R058 Other specified cough: Secondary | ICD-10-CM | POA: Diagnosis not present

## 2020-08-12 DIAGNOSIS — I7300 Raynaud's syndrome without gangrene: Secondary | ICD-10-CM | POA: Diagnosis present

## 2020-08-12 DIAGNOSIS — K567 Ileus, unspecified: Secondary | ICD-10-CM | POA: Diagnosis not present

## 2020-08-12 DIAGNOSIS — R059 Cough, unspecified: Secondary | ICD-10-CM | POA: Diagnosis not present

## 2020-08-12 DIAGNOSIS — M810 Age-related osteoporosis without current pathological fracture: Secondary | ICD-10-CM | POA: Diagnosis present

## 2020-08-12 DIAGNOSIS — Z888 Allergy status to other drugs, medicaments and biological substances status: Secondary | ICD-10-CM | POA: Diagnosis not present

## 2020-08-12 DIAGNOSIS — Z79818 Long term (current) use of other agents affecting estrogen receptors and estrogen levels: Secondary | ICD-10-CM | POA: Diagnosis not present

## 2020-08-12 DIAGNOSIS — Z91018 Allergy to other foods: Secondary | ICD-10-CM | POA: Diagnosis not present

## 2020-08-12 DIAGNOSIS — Z87891 Personal history of nicotine dependence: Secondary | ICD-10-CM | POA: Diagnosis not present

## 2020-08-12 DIAGNOSIS — M48061 Spinal stenosis, lumbar region without neurogenic claudication: Secondary | ICD-10-CM | POA: Diagnosis present

## 2020-08-12 DIAGNOSIS — G9341 Metabolic encephalopathy: Secondary | ICD-10-CM | POA: Diagnosis not present

## 2020-08-12 DIAGNOSIS — M4120 Other idiopathic scoliosis, site unspecified: Secondary | ICD-10-CM | POA: Diagnosis present

## 2020-08-12 DIAGNOSIS — N952 Postmenopausal atrophic vaginitis: Secondary | ICD-10-CM | POA: Diagnosis not present

## 2020-08-12 DIAGNOSIS — E8770 Fluid overload, unspecified: Secondary | ICD-10-CM | POA: Diagnosis not present

## 2020-08-12 DIAGNOSIS — R339 Retention of urine, unspecified: Secondary | ICD-10-CM | POA: Diagnosis not present

## 2020-08-12 DIAGNOSIS — I493 Ventricular premature depolarization: Secondary | ICD-10-CM | POA: Diagnosis not present

## 2020-08-12 DIAGNOSIS — Z886 Allergy status to analgesic agent status: Secondary | ICD-10-CM

## 2020-08-12 DIAGNOSIS — Z87440 Personal history of urinary (tract) infections: Secondary | ICD-10-CM

## 2020-08-12 DIAGNOSIS — F05 Delirium due to known physiological condition: Secondary | ICD-10-CM | POA: Diagnosis not present

## 2020-08-12 DIAGNOSIS — Z7982 Long term (current) use of aspirin: Secondary | ICD-10-CM | POA: Diagnosis not present

## 2020-08-12 DIAGNOSIS — D62 Acute posthemorrhagic anemia: Secondary | ICD-10-CM | POA: Diagnosis not present

## 2020-08-12 DIAGNOSIS — T380X5A Adverse effect of glucocorticoids and synthetic analogues, initial encounter: Secondary | ICD-10-CM | POA: Diagnosis not present

## 2020-08-12 DIAGNOSIS — E222 Syndrome of inappropriate secretion of antidiuretic hormone: Secondary | ICD-10-CM | POA: Diagnosis not present

## 2020-08-12 DIAGNOSIS — M419 Scoliosis, unspecified: Secondary | ICD-10-CM | POA: Diagnosis present

## 2020-08-12 DIAGNOSIS — E739 Lactose intolerance, unspecified: Secondary | ICD-10-CM | POA: Diagnosis present

## 2020-08-12 DIAGNOSIS — D72829 Elevated white blood cell count, unspecified: Secondary | ICD-10-CM | POA: Diagnosis not present

## 2020-08-12 DIAGNOSIS — D500 Iron deficiency anemia secondary to blood loss (chronic): Secondary | ICD-10-CM | POA: Diagnosis not present

## 2020-08-12 DIAGNOSIS — E871 Hypo-osmolality and hyponatremia: Secondary | ICD-10-CM

## 2020-08-12 HISTORY — PX: LUMBAR PERCUTANEOUS PEDICLE SCREW 4 LEVEL: SHX6318

## 2020-08-12 HISTORY — PX: ANTERIOR LUMBAR FUSION: SHX1170

## 2020-08-12 HISTORY — PX: ANTERIOR LAT LUMBAR FUSION: SHX1168

## 2020-08-12 HISTORY — PX: ABDOMINAL EXPOSURE: SHX5708

## 2020-08-12 LAB — POCT I-STAT 7, (LYTES, BLD GAS, ICA,H+H)
Acid-Base Excess: 4 mmol/L — ABNORMAL HIGH (ref 0.0–2.0)
Acid-Base Excess: 2 mmol/L (ref 0.0–2.0)
Bicarbonate: 27.7 mmol/L (ref 20.0–28.0)
Bicarbonate: 26.2 mmol/L (ref 20.0–28.0)
Calcium, Ion: 1.15 mmol/L (ref 1.15–1.40)
Calcium, Ion: 1.11 mmol/L — ABNORMAL LOW (ref 1.15–1.40)
HCT: 31 % — ABNORMAL LOW (ref 36.0–46.0)
HCT: 30 % — ABNORMAL LOW (ref 36.0–46.0)
Hemoglobin: 10.5 g/dL — ABNORMAL LOW (ref 12.0–15.0)
Hemoglobin: 10.2 g/dL — ABNORMAL LOW (ref 12.0–15.0)
O2 Saturation: 100 %
O2 Saturation: 100 %
Potassium: 3.7 mmol/L (ref 3.5–5.1)
Potassium: 4 mmol/L (ref 3.5–5.1)
Sodium: 139 mmol/L (ref 135–145)
Sodium: 138 mmol/L (ref 135–145)
TCO2: 29 mmol/L (ref 22–32)
TCO2: 27 mmol/L (ref 22–32)
pCO2 arterial: 38.3 mmHg (ref 32.0–48.0)
pCO2 arterial: 36.4 mmHg (ref 32.0–48.0)
pH, Arterial: 7.466 — ABNORMAL HIGH (ref 7.350–7.450)
pH, Arterial: 7.466 — ABNORMAL HIGH (ref 7.350–7.450)
pO2, Arterial: 441 mmHg — ABNORMAL HIGH (ref 83.0–108.0)
pO2, Arterial: 280 mmHg — ABNORMAL HIGH (ref 83.0–108.0)

## 2020-08-12 LAB — ABO/RH: ABO/RH(D): O POS

## 2020-08-12 SURGERY — ANTERIOR LUMBAR FUSION 1 LEVEL
Anesthesia: General | Site: Flank | Laterality: Right

## 2020-08-12 MED ORDER — SODIUM CHLORIDE (PF) 0.9 % IJ SOLN
INTRAMUSCULAR | Status: DC | PRN
  Administered 2020-08-12: 20 mL

## 2020-08-12 MED ORDER — HYDROMORPHONE HCL 1 MG/ML IJ SOLN
0.5000 mg | INTRAMUSCULAR | Status: DC | PRN
  Administered 2020-08-12 – 2020-08-23 (×5): 0.5 mg via INTRAVENOUS
  Filled 2020-08-12 (×6): qty 1

## 2020-08-12 MED ORDER — SODIUM CHLORIDE 0.9 % IV SOLN
250.0000 mL | INTRAVENOUS | Status: DC
  Administered 2020-08-12: 250 mL via INTRAVENOUS

## 2020-08-12 MED ORDER — PROPOFOL 10 MG/ML IV BOLUS
INTRAVENOUS | Status: DC | PRN
  Administered 2020-08-12: 140 mg via INTRAVENOUS
  Administered 2020-08-12: 20 mg via INTRAVENOUS

## 2020-08-12 MED ORDER — ALUM & MAG HYDROXIDE-SIMETH 200-200-20 MG/5ML PO SUSP
30.0000 mL | Freq: Four times a day (QID) | ORAL | Status: DC | PRN
  Administered 2020-08-14 – 2020-08-15 (×2): 30 mL via ORAL
  Filled 2020-08-12 (×2): qty 30

## 2020-08-12 MED ORDER — KCL IN DEXTROSE-NACL 20-5-0.45 MEQ/L-%-% IV SOLN
INTRAVENOUS | Status: DC
  Filled 2020-08-12 (×7): qty 1000

## 2020-08-12 MED ORDER — METHENAMINE MANDELATE 1 G PO TABS
1000.0000 mg | ORAL_TABLET | Freq: Two times a day (BID) | ORAL | Status: DC
  Administered 2020-08-13 – 2020-08-23 (×20): 1000 mg via ORAL
  Filled 2020-08-12 (×23): qty 1

## 2020-08-12 MED ORDER — PHENOL 1.4 % MT LIQD: 1.0000 | OROMUCOSAL | Status: DC | PRN

## 2020-08-12 MED ORDER — VANCOMYCIN HCL IN DEXTROSE 1-5 GM/200ML-% IV SOLN
INTRAVENOUS | Status: AC
  Filled 2020-08-12: qty 200

## 2020-08-12 MED ORDER — ALBUMIN HUMAN 5 % IV SOLN: INTRAVENOUS | Status: DC | PRN

## 2020-08-12 MED ORDER — FENTANYL CITRATE (PF) 250 MCG/5ML IJ SOLN
INTRAMUSCULAR | Status: AC
  Filled 2020-08-12: qty 5

## 2020-08-12 MED ORDER — METHOCARBAMOL 500 MG PO TABS
500.0000 mg | ORAL_TABLET | Freq: Four times a day (QID) | ORAL | Status: DC | PRN
  Administered 2020-08-12 – 2020-08-23 (×16): 500 mg via ORAL
  Filled 2020-08-12 (×15): qty 1

## 2020-08-12 MED ORDER — ADULT MULTIVITAMIN W/MINERALS CH
1.0000 | ORAL_TABLET | Freq: Every day | ORAL | Status: DC
  Administered 2020-08-13 – 2020-08-23 (×11): 1 via ORAL
  Filled 2020-08-12 (×11): qty 1

## 2020-08-12 MED ORDER — ONDANSETRON HCL 4 MG PO TABS: 4.0000 mg | ORAL_TABLET | Freq: Four times a day (QID) | ORAL | Status: DC | PRN

## 2020-08-12 MED ORDER — ZOLPIDEM TARTRATE 5 MG PO TABS: 5.0000 mg | ORAL_TABLET | Freq: Every evening | ORAL | Status: DC | PRN

## 2020-08-12 MED ORDER — OXYCODONE HCL 5 MG PO TABS
5.0000 mg | ORAL_TABLET | ORAL | Status: DC | PRN
  Administered 2020-08-13 – 2020-08-21 (×13): 5 mg via ORAL
  Filled 2020-08-12 (×14): qty 1

## 2020-08-12 MED ORDER — BIOTIN 5000 MCG PO TABS: 5000.0000 ug | ORAL_TABLET | Freq: Every day | ORAL | Status: DC

## 2020-08-12 MED ORDER — CHLORHEXIDINE GLUCONATE 0.12 % MT SOLN: 15.0000 mL | Freq: Once | OROMUCOSAL | Status: AC

## 2020-08-12 MED ORDER — HYDROMORPHONE HCL 1 MG/ML IJ SOLN
INTRAMUSCULAR | Status: AC
  Filled 2020-08-12: qty 1

## 2020-08-12 MED ORDER — METHOCARBAMOL 1000 MG/10ML IJ SOLN
500.0000 mg | Freq: Four times a day (QID) | INTRAVENOUS | Status: DC | PRN
  Filled 2020-08-12: qty 5

## 2020-08-12 MED ORDER — MENTHOL 3 MG MT LOZG
1.0000 | LOZENGE | OROMUCOSAL | Status: DC | PRN
  Filled 2020-08-12: qty 9

## 2020-08-12 MED ORDER — LACTATED RINGERS IV SOLN: INTRAVENOUS | Status: DC | PRN

## 2020-08-12 MED ORDER — BUPIVACAINE HCL (PF) 0.5 % IJ SOLN
INTRAMUSCULAR | Status: AC
  Filled 2020-08-12: qty 30

## 2020-08-12 MED ORDER — TRAZODONE 25 MG HALF TABLET
25.0000 mg | ORAL_TABLET | Freq: Every day | ORAL | Status: DC
  Administered 2020-08-12 – 2020-08-16 (×5): 25 mg via ORAL
  Filled 2020-08-12 (×6): qty 1

## 2020-08-12 MED ORDER — CHLORHEXIDINE GLUCONATE 4 % EX LIQD: 60.0000 mL | Freq: Once | CUTANEOUS | Status: DC

## 2020-08-12 MED ORDER — HYDROCODONE-ACETAMINOPHEN 10-325 MG PO TABS
2.0000 | ORAL_TABLET | ORAL | Status: DC | PRN
  Administered 2020-08-12 – 2020-08-17 (×10): 2 via ORAL
  Filled 2020-08-12 (×11): qty 2

## 2020-08-12 MED ORDER — ESTROGENS, CONJUGATED 0.625 MG/GM VA CREA
0.5000 g | TOPICAL_CREAM | VAGINAL | Status: DC
  Administered 2020-08-13 – 2020-08-23 (×5): 0.25 via VAGINAL
  Filled 2020-08-12: qty 30

## 2020-08-12 MED ORDER — THROMBIN 5000 UNITS EX KIT
PACK | CUTANEOUS | Status: AC
  Filled 2020-08-12: qty 1

## 2020-08-12 MED ORDER — ONDANSETRON HCL 4 MG/2ML IJ SOLN
4.0000 mg | Freq: Four times a day (QID) | INTRAMUSCULAR | Status: DC | PRN
  Administered 2020-08-15: 4 mg via INTRAVENOUS
  Filled 2020-08-12: qty 2

## 2020-08-12 MED ORDER — LIDOCAINE-EPINEPHRINE 1 %-1:100000 IJ SOLN
INTRAMUSCULAR | Status: AC
  Filled 2020-08-12: qty 1

## 2020-08-12 MED ORDER — CHLORHEXIDINE GLUCONATE CLOTH 2 % EX PADS: 6.0000 | MEDICATED_PAD | Freq: Once | CUTANEOUS | Status: DC

## 2020-08-12 MED ORDER — ONDANSETRON HCL 4 MG/2ML IJ SOLN
INTRAMUSCULAR | Status: DC | PRN
  Administered 2020-08-12: 4 mg via INTRAVENOUS

## 2020-08-12 MED ORDER — LACTATED RINGERS IV SOLN: INTRAVENOUS | Status: DC

## 2020-08-12 MED ORDER — HYDROMORPHONE HCL 1 MG/ML IJ SOLN
0.2500 mg | INTRAMUSCULAR | Status: DC | PRN
  Administered 2020-08-12: 0.25 mg via INTRAVENOUS

## 2020-08-12 MED ORDER — SODIUM CHLORIDE 0.9% FLUSH
3.0000 mL | Freq: Two times a day (BID) | INTRAVENOUS | Status: DC
  Administered 2020-08-12 – 2020-08-23 (×18): 3 mL via INTRAVENOUS

## 2020-08-12 MED ORDER — SUGAMMADEX SODIUM 200 MG/2ML IV SOLN
INTRAVENOUS | Status: DC | PRN
  Administered 2020-08-12: 120 mg via INTRAVENOUS

## 2020-08-12 MED ORDER — 0.9 % SODIUM CHLORIDE (POUR BTL) OPTIME
TOPICAL | Status: DC | PRN
  Administered 2020-08-12: 1000 mL

## 2020-08-12 MED ORDER — VALACYCLOVIR HCL 500 MG PO TABS
500.0000 mg | ORAL_TABLET | Freq: Every day | ORAL | Status: DC
  Administered 2020-08-13 – 2020-08-19 (×7): 500 mg via ORAL
  Filled 2020-08-12 (×8): qty 1

## 2020-08-12 MED ORDER — HYDROMORPHONE HCL 1 MG/ML IJ SOLN
INTRAMUSCULAR | Status: AC
  Administered 2020-08-12: 0.5 mg via INTRAVENOUS
  Filled 2020-08-12: qty 1

## 2020-08-12 MED ORDER — ACETAMINOPHEN 650 MG RE SUPP: 650.0000 mg | RECTAL | Status: DC | PRN

## 2020-08-12 MED ORDER — CHLORHEXIDINE GLUCONATE 0.12 % MT SOLN
OROMUCOSAL | Status: AC
  Administered 2020-08-12: 15 mL via OROMUCOSAL
  Filled 2020-08-12: qty 15

## 2020-08-12 MED ORDER — POLYETHYLENE GLYCOL 3350 17 G PO PACK
17.0000 g | PACK | Freq: Every day | ORAL | Status: DC | PRN
  Administered 2020-08-14 – 2020-08-15 (×2): 17 g via ORAL
  Filled 2020-08-12 (×2): qty 1

## 2020-08-12 MED ORDER — DOCUSATE SODIUM 100 MG PO CAPS
100.0000 mg | ORAL_CAPSULE | Freq: Two times a day (BID) | ORAL | Status: DC
  Administered 2020-08-13 – 2020-08-18 (×7): 100 mg via ORAL
  Filled 2020-08-12 (×11): qty 1

## 2020-08-12 MED ORDER — HYDROMORPHONE HCL 1 MG/ML IJ SOLN
INTRAMUSCULAR | Status: DC | PRN
  Administered 2020-08-12 (×2): .25 mg via INTRAVENOUS

## 2020-08-12 MED ORDER — BUPIVACAINE HCL (PF) 0.5 % IJ SOLN
INTRAMUSCULAR | Status: DC | PRN
  Administered 2020-08-12: 5 mL
  Administered 2020-08-12: 10 mL

## 2020-08-12 MED ORDER — ASPIRIN EC 81 MG PO TBEC
81.0000 mg | DELAYED_RELEASE_TABLET | Freq: Every day | ORAL | Status: DC
  Administered 2020-08-13 – 2020-08-23 (×11): 81 mg via ORAL
  Filled 2020-08-12 (×11): qty 1

## 2020-08-12 MED ORDER — VITAMIN D 25 MCG (1000 UNIT) PO TABS
2000.0000 [IU] | ORAL_TABLET | Freq: Three times a day (TID) | ORAL | Status: DC
  Administered 2020-08-13 – 2020-08-19 (×19): 2000 [IU] via ORAL
  Filled 2020-08-12 (×25): qty 2

## 2020-08-12 MED ORDER — SODIUM CHLORIDE 0.9% FLUSH: 3.0000 mL | INTRAVENOUS | Status: DC | PRN

## 2020-08-12 MED ORDER — ASCORBIC ACID 500 MG PO TABS
500.0000 mg | ORAL_TABLET | Freq: Every day | ORAL | Status: DC
  Administered 2020-08-13 – 2020-08-19 (×7): 500 mg via ORAL
  Filled 2020-08-12 (×7): qty 1

## 2020-08-12 MED ORDER — BISACODYL 10 MG RE SUPP
10.0000 mg | Freq: Every day | RECTAL | Status: DC | PRN
  Administered 2020-08-14: 10 mg via RECTAL
  Filled 2020-08-12 (×2): qty 1

## 2020-08-12 MED ORDER — ORAL CARE MOUTH RINSE: 15.0000 mL | Freq: Once | OROMUCOSAL | Status: AC

## 2020-08-12 MED ORDER — CALCIUM CARBONATE 1250 (500 CA) MG PO TABS
1.0000 | ORAL_TABLET | Freq: Every day | ORAL | Status: DC
  Administered 2020-08-13 – 2020-08-19 (×7): 500 mg via ORAL
  Filled 2020-08-12 (×9): qty 1

## 2020-08-12 MED ORDER — DEXAMETHASONE SODIUM PHOSPHATE 10 MG/ML IJ SOLN
INTRAMUSCULAR | Status: DC | PRN
  Administered 2020-08-12: 10 mg via INTRAVENOUS

## 2020-08-12 MED ORDER — ROCURONIUM BROMIDE 10 MG/ML (PF) SYRINGE
PREFILLED_SYRINGE | INTRAVENOUS | Status: DC | PRN
  Administered 2020-08-12: 60 mg via INTRAVENOUS

## 2020-08-12 MED ORDER — FLEET ENEMA 7-19 GM/118ML RE ENEM
1.0000 | ENEMA | Freq: Once | RECTAL | Status: AC | PRN
  Administered 2020-08-15: 1 via RECTAL
  Filled 2020-08-12: qty 1

## 2020-08-12 MED ORDER — LIDOCAINE-EPINEPHRINE 1 %-1:100000 IJ SOLN
INTRAMUSCULAR | Status: DC | PRN
  Administered 2020-08-12: 5 mL
  Administered 2020-08-12: 10 mL

## 2020-08-12 MED ORDER — ACETAMINOPHEN 10 MG/ML IV SOLN
INTRAVENOUS | Status: DC | PRN
  Administered 2020-08-12: 1000 mg via INTRAVENOUS

## 2020-08-12 MED ORDER — POLYETHYL GLYCOL-PROPYL GLYCOL 0.4-0.3 % OP SOLN: 1.0000 [drp] | OPHTHALMIC | Status: DC | PRN

## 2020-08-12 MED ORDER — LIDOCAINE 2% (20 MG/ML) 5 ML SYRINGE
INTRAMUSCULAR | Status: DC | PRN
  Administered 2020-08-12: 50 mg via INTRAVENOUS

## 2020-08-12 MED ORDER — CALCIUM 600-400 MG-UNIT PO CHEW: CHEWABLE_TABLET | Freq: Every day | ORAL | Status: DC

## 2020-08-12 MED ORDER — THROMBIN 5000 UNITS EX SOLR: OROMUCOSAL | Status: DC | PRN

## 2020-08-12 MED ORDER — METHENAMINE HIPPURATE 1 G PO TABS: 1.0000 g | ORAL_TABLET | Freq: Two times a day (BID) | ORAL | Status: DC

## 2020-08-12 MED ORDER — SIMETHICONE 80 MG PO CHEW: 160.0000 mg | CHEWABLE_TABLET | Freq: Four times a day (QID) | ORAL | Status: DC | PRN

## 2020-08-12 MED ORDER — VANCOMYCIN HCL IN DEXTROSE 1-5 GM/200ML-% IV SOLN
1000.0000 mg | INTRAVENOUS | Status: AC
  Administered 2020-08-12: 1000 mg via INTRAVENOUS

## 2020-08-12 MED ORDER — GLYCOPYRROLATE PF 0.2 MG/ML IJ SOSY
PREFILLED_SYRINGE | INTRAMUSCULAR | Status: DC | PRN
  Administered 2020-08-12: .1 mg via INTRAVENOUS

## 2020-08-12 MED ORDER — PHENYLEPHRINE HCL-NACL 10-0.9 MG/250ML-% IV SOLN
INTRAVENOUS | Status: DC | PRN
  Administered 2020-08-12: 30 ug/min via INTRAVENOUS

## 2020-08-12 MED ORDER — METHOCARBAMOL 500 MG PO TABS
ORAL_TABLET | ORAL | Status: AC
  Filled 2020-08-12: qty 1

## 2020-08-12 MED ORDER — B COMPLEX-C PO TABS
1.0000 | ORAL_TABLET | Freq: Every day | ORAL | Status: DC
  Administered 2020-08-13 – 2020-08-19 (×6): 1 via ORAL
  Filled 2020-08-12 (×8): qty 1

## 2020-08-12 MED ORDER — HYDROMORPHONE HCL 1 MG/ML IJ SOLN
INTRAMUSCULAR | Status: AC
  Administered 2020-08-12: 0.25 mg via INTRAVENOUS
  Filled 2020-08-12: qty 1

## 2020-08-12 MED ORDER — PROPOFOL 10 MG/ML IV BOLUS
INTRAVENOUS | Status: AC
  Filled 2020-08-12: qty 20

## 2020-08-12 MED ORDER — ACETAMINOPHEN 325 MG PO TABS
650.0000 mg | ORAL_TABLET | ORAL | Status: DC | PRN
  Administered 2020-08-22: 650 mg via ORAL
  Filled 2020-08-12: qty 2

## 2020-08-12 MED ORDER — POLYVINYL ALCOHOL 1.4 % OP SOLN
1.0000 [drp] | OPHTHALMIC | Status: DC | PRN
  Filled 2020-08-12: qty 15

## 2020-08-12 MED ORDER — LOPERAMIDE-SIMETHICONE 2-125 MG PO TABS: 1.0000 | ORAL_TABLET | Freq: Four times a day (QID) | ORAL | Status: DC | PRN

## 2020-08-12 MED ORDER — VANCOMYCIN HCL 500 MG/100ML IV SOLN
500.0000 mg | Freq: Once | INTRAVENOUS | Status: AC
  Administered 2020-08-12: 500 mg via INTRAVENOUS
  Filled 2020-08-12: qty 100

## 2020-08-12 MED ORDER — BUPIVACAINE LIPOSOME 1.3 % IJ SUSP
20.0000 mL | Freq: Once | INTRAMUSCULAR | Status: AC
  Administered 2020-08-12: 20 mL
  Filled 2020-08-12: qty 20

## 2020-08-12 MED ORDER — LOPERAMIDE HCL 2 MG PO CAPS
2.0000 mg | ORAL_CAPSULE | Freq: Four times a day (QID) | ORAL | Status: DC | PRN
  Filled 2020-08-12: qty 1

## 2020-08-12 MED ORDER — FENTANYL CITRATE (PF) 100 MCG/2ML IJ SOLN
INTRAMUSCULAR | Status: DC | PRN
  Administered 2020-08-12 (×7): 50 ug via INTRAVENOUS

## 2020-08-12 MED ORDER — PANTOPRAZOLE SODIUM 40 MG IV SOLR
40.0000 mg | Freq: Every day | INTRAVENOUS | Status: DC
  Administered 2020-08-12 – 2020-08-13 (×2): 40 mg via INTRAVENOUS
  Filled 2020-08-12 (×2): qty 40

## 2020-08-12 MED ORDER — GABAPENTIN 100 MG PO CAPS
100.0000 mg | ORAL_CAPSULE | Freq: Every day | ORAL | Status: DC
  Administered 2020-08-12 – 2020-08-16 (×5): 100 mg via ORAL
  Filled 2020-08-12 (×6): qty 1

## 2020-08-12 SURGICAL SUPPLY — 138 items
ADH SKN CLS APL DERMABOND .7 (GAUZE/BANDAGES/DRESSINGS) ×12
APPLIER CLIP 11 MED OPEN (CLIP) ×10
APR CLP MED 11 20 MLT OPN (CLIP) ×8
BASKET BONE COLLECTION (BASKET) IMPLANT
BLADE CLIPPER SURG (BLADE) IMPLANT
BOLT ALIF MODULUS 5X20 2-PK (Bolt) ×1 IMPLANT
BRUSH SCRUB EZ PLAIN DRY (MISCELLANEOUS) ×1 IMPLANT
BUR BARREL STRAIGHT FLUTE 4.0 (BURR) IMPLANT
CAGE MODULUS XL 8X18X45 - 10 (Cage) ×1 IMPLANT
CANISTER SUCT 3000ML PPV (MISCELLANEOUS) ×5 IMPLANT
CARTRIDGE OIL MAESTRO DRILL (MISCELLANEOUS) ×8 IMPLANT
CLIP APPLIE 11 MED OPEN (CLIP) ×8 IMPLANT
CLIP NEUROVISION LG (CLIP) ×1 IMPLANT
CLIP VESOCCLUDE MED 24/CT (CLIP) ×5 IMPLANT
CLIP VESOCCLUDE SM WIDE 24/CT (CLIP) ×5 IMPLANT
CNTNR URN SCR LID CUP LEK RST (MISCELLANEOUS) ×4 IMPLANT
CONT SPEC 4OZ STRL OR WHT (MISCELLANEOUS) ×5
COVER BACK TABLE 24X17X13 BIG (DRAPES) IMPLANT
COVER BACK TABLE 60X90IN (DRAPES) ×15 IMPLANT
COVER WAND RF STERILE (DRAPES) ×20 IMPLANT
DECANTER SPIKE VIAL GLASS SM (MISCELLANEOUS) ×15 IMPLANT
DERMABOND ADVANCED (GAUZE/BANDAGES/DRESSINGS) ×3
DERMABOND ADVANCED .7 DNX12 (GAUZE/BANDAGES/DRESSINGS) ×16 IMPLANT
DIFFUSER DRILL AIR PNEUMATIC (MISCELLANEOUS) ×10 IMPLANT
DRAPE C-ARM 42X72 X-RAY (DRAPES) ×15 IMPLANT
DRAPE C-ARMOR (DRAPES) ×15 IMPLANT
DRAPE INCISE IOBAN 66X45 STRL (DRAPES) ×5 IMPLANT
DRAPE LAPAROTOMY 100X72X124 (DRAPES) ×15 IMPLANT
DRAPE SURG 17X23 STRL (DRAPES) ×5 IMPLANT
DRSG OPSITE POSTOP 3X4 (GAUZE/BANDAGES/DRESSINGS) ×1 IMPLANT
DRSG OPSITE POSTOP 4X6 (GAUZE/BANDAGES/DRESSINGS) ×3 IMPLANT
DRSG OPSITE POSTOP 4X8 (GAUZE/BANDAGES/DRESSINGS) ×2 IMPLANT
DURAPREP 26ML APPLICATOR (WOUND CARE) ×15 IMPLANT
ELECT BLADE 4.0 EZ CLEAN MEGAD (MISCELLANEOUS)
ELECT REM PT RETURN 9FT ADLT (ELECTROSURGICAL) ×15
ELECTRODE BLDE 4.0 EZ CLN MEGD (MISCELLANEOUS) IMPLANT
ELECTRODE REM PT RTRN 9FT ADLT (ELECTROSURGICAL) ×12 IMPLANT
GAUZE 4X4 16PLY RFD (DISPOSABLE) ×3 IMPLANT
GAUZE SPONGE 4X4 12PLY STRL (GAUZE/BANDAGES/DRESSINGS) ×5 IMPLANT
GLOVE BIO SURGEON STRL SZ8 (GLOVE) ×20 IMPLANT
GLOVE BIOGEL PI IND STRL 7.5 (GLOVE) ×4 IMPLANT
GLOVE BIOGEL PI IND STRL 8.5 (GLOVE) ×16 IMPLANT
GLOVE BIOGEL PI INDICATOR 7.5 (GLOVE) ×1
GLOVE BIOGEL PI INDICATOR 8.5 (GLOVE) ×4
GLOVE ECLIPSE 8.0 STRL XLNG CF (GLOVE) ×20 IMPLANT
GLOVE EXAM NITRILE XL STR (GLOVE) IMPLANT
GLOVE SRG 8 PF TXTR STRL LF DI (GLOVE) ×16 IMPLANT
GLOVE SURG POLYISO LF SZ7 (GLOVE) ×2 IMPLANT
GLOVE SURG POLYISO LF SZ8 (GLOVE) ×9 IMPLANT
GLOVE SURG SS PI 7.5 STRL IVOR (GLOVE) ×5 IMPLANT
GLOVE SURG UNDER POLY LF SZ7.5 (GLOVE) ×3 IMPLANT
GLOVE SURG UNDER POLY LF SZ8 (GLOVE) ×25
GLOVE SURG UNDER POLY LF SZ8.5 (GLOVE) ×1 IMPLANT
GOWN STRL REUS W/ TWL LRG LVL3 (GOWN DISPOSABLE) ×8 IMPLANT
GOWN STRL REUS W/ TWL XL LVL3 (GOWN DISPOSABLE) ×20 IMPLANT
GOWN STRL REUS W/TWL 2XL LVL3 (GOWN DISPOSABLE) ×13 IMPLANT
GOWN STRL REUS W/TWL LRG LVL3 (GOWN DISPOSABLE) ×10
GOWN STRL REUS W/TWL XL LVL3 (GOWN DISPOSABLE) ×25
GUIDEWIRE NITINOL BEVEL TIP (WIRE) ×10 IMPLANT
HEMOSTAT POWDER KIT SURGIFOAM (HEMOSTASIS) ×5 IMPLANT
HEMOSTAT POWDER SURGIFOAM 1G (HEMOSTASIS) ×5 IMPLANT
INSERT FOGARTY 61MM (MISCELLANEOUS) IMPLANT
INSERT FOGARTY SM (MISCELLANEOUS) IMPLANT
KIT BASIN OR (CUSTOM PROCEDURE TRAY) ×15 IMPLANT
KIT DILATOR XLIF 5 (KITS) IMPLANT
KIT INFUSE X SMALL 1.4CC (Orthopedic Implant) ×1 IMPLANT
KIT INFUSE XX SMALL 0.7CC (Orthopedic Implant) ×1 IMPLANT
KIT POSITION SURG JACKSON T1 (MISCELLANEOUS) ×5 IMPLANT
KIT SURGICAL ACCESS MAXCESS 4 (KITS) ×1 IMPLANT
KIT TURNOVER KIT B (KITS) ×20 IMPLANT
KIT XLIF (KITS) ×1
LOOP VESSEL MAXI BLUE (MISCELLANEOUS) ×5 IMPLANT
LOOP VESSEL MINI RED (MISCELLANEOUS) ×5 IMPLANT
MARKER SKIN DUAL TIP RULER LAB (MISCELLANEOUS) ×8 IMPLANT
MODULE NVM5 NEXT GEN EMG (NEEDLE) ×1 IMPLANT
MODULUS XLW 10X22X45MM 10 DEG (Cage) ×1 IMPLANT
MODULUS XLW 10X22X50MM 10DEG (Spine Construct) ×1 IMPLANT
NDL HYPO 21X1.5 SAFETY (NEEDLE) IMPLANT
NDL HYPO 25X1 1.5 SAFETY (NEEDLE) ×8 IMPLANT
NDL I PASS (NEEDLE) IMPLANT
NDL SPNL 18GX3.5 QUINCKE PK (NEEDLE) ×4 IMPLANT
NEEDLE HYPO 21X1.5 SAFETY (NEEDLE) ×5 IMPLANT
NEEDLE HYPO 25X1 1.5 SAFETY (NEEDLE) ×15 IMPLANT
NEEDLE I PASS (NEEDLE) ×5 IMPLANT
NEEDLE SPNL 18GX3.5 QUINCKE PK (NEEDLE) ×5 IMPLANT
NS IRRIG 1000ML POUR BTL (IV SOLUTION) ×15 IMPLANT
OIL CARTRIDGE MAESTRO DRILL (MISCELLANEOUS) ×10
PACK LAMINECTOMY NEURO (CUSTOM PROCEDURE TRAY) ×15 IMPLANT
PAD ARMBOARD 7.5X6 YLW CONV (MISCELLANEOUS) ×25 IMPLANT
PATTIES SURGICAL .5 X.5 (GAUZE/BANDAGES/DRESSINGS) IMPLANT
PATTIES SURGICAL .5 X1 (DISPOSABLE) IMPLANT
PATTIES SURGICAL 1X1 (DISPOSABLE) IMPLANT
PUTTY BONE ATTRAX 10CC STRIP (Putty) ×2 IMPLANT
ROD PRECEPT TI 130MM LORD PB (Rod) ×2 IMPLANT
SCREW LOCK RELINE 5.5 TULIP (Screw) ×10 IMPLANT
SCREW MAS RELINE 6.5X45 POLY (Screw) ×4 IMPLANT
SCREW RELINE MAAS POLY 6.5X35 (Screw) ×2 IMPLANT
SCREW RELINE MAS POLY 5.5X40 (Screw) ×4 IMPLANT
SPACER ALIF MOD 6X38X28 15D (Spacer) ×1 IMPLANT
SPONGE INTESTINAL PEANUT (DISPOSABLE) ×7 IMPLANT
SPONGE LAP 18X18 RF (DISPOSABLE) ×5 IMPLANT
SPONGE LAP 4X18 RFD (DISPOSABLE) IMPLANT
SPONGE SURGIFOAM ABS GEL 100 (HEMOSTASIS) IMPLANT
SPONGE SURGIFOAM ABS GEL SZ50 (HEMOSTASIS) ×5 IMPLANT
STAPLER SKIN PROX WIDE 3.9 (STAPLE) ×5 IMPLANT
STAPLER VISISTAT 35W (STAPLE) IMPLANT
SUT MNCRL AB 4-0 PS2 18 (SUTURE) ×5 IMPLANT
SUT PDS AB 1 CTX 36 (SUTURE) ×5 IMPLANT
SUT PROLENE 4 0 RB 1 (SUTURE) ×20
SUT PROLENE 4-0 RB1 .5 CRCL 36 (SUTURE) ×16 IMPLANT
SUT PROLENE 5 0 CC1 (SUTURE) IMPLANT
SUT PROLENE 6 0 C 1 30 (SUTURE) ×5 IMPLANT
SUT PROLENE 6 0 CC (SUTURE) IMPLANT
SUT SILK 0 TIES 10X30 (SUTURE) ×5 IMPLANT
SUT SILK 2 0 TIES 10X30 (SUTURE) ×5 IMPLANT
SUT SILK 2 0 TIES 17X18 (SUTURE) ×5
SUT SILK 2 0SH CR/8 30 (SUTURE) IMPLANT
SUT SILK 2-0 18XBRD TIE BLK (SUTURE) ×4 IMPLANT
SUT SILK 3 0 TIES 10X30 (SUTURE) ×5 IMPLANT
SUT SILK 3 0SH CR/8 30 (SUTURE) IMPLANT
SUT VIC AB 0 CT1 27 (SUTURE) ×5
SUT VIC AB 0 CT1 27XBRD ANBCTR (SUTURE) ×4 IMPLANT
SUT VIC AB 1 CT1 18XBRD ANBCTR (SUTURE) ×12 IMPLANT
SUT VIC AB 1 CT1 8-18 (SUTURE) ×35
SUT VIC AB 2-0 CT1 18 (SUTURE) ×23 IMPLANT
SUT VIC AB 2-0 CT1 27 (SUTURE) ×5
SUT VIC AB 2-0 CT1 TAPERPNT 27 (SUTURE) ×4 IMPLANT
SUT VIC AB 3-0 SH 27 (SUTURE) ×5
SUT VIC AB 3-0 SH 27X BRD (SUTURE) ×4 IMPLANT
SUT VIC AB 3-0 SH 8-18 (SUTURE) ×25 IMPLANT
SUT VICRYL 4-0 PS2 18IN ABS (SUTURE) IMPLANT
SYR 20ML LL LF (SYRINGE) ×2 IMPLANT
SYR CONTROL 10ML LL (SYRINGE) ×1 IMPLANT
SYR TB 1ML 25GX5/8 (SYRINGE) IMPLANT
TOWEL GREEN STERILE (TOWEL DISPOSABLE) ×20 IMPLANT
TOWEL GREEN STERILE FF (TOWEL DISPOSABLE) ×15 IMPLANT
TRAY FOLEY MTR SLVR 16FR STAT (SET/KITS/TRAYS/PACK) ×15 IMPLANT
WATER STERILE IRR 1000ML POUR (IV SOLUTION) ×15 IMPLANT

## 2020-08-12 NOTE — Brief Op Note (Signed)
08/12/2020  3:52 PM  PATIENT:  Carol Perez  80 y.o. female  PRE-OPERATIVE DIAGNOSIS:  Lumbago with sciatica, lumbar scoliosis, degenerative lumbar stenosis, lumbar radiculopathy   POST-OPERATIVE DIAGNOSIS:  Lumbago with sciatica, lumbar scoliosis, degenerative lumbar stenosis, lumbar radiculopathy   PROCEDURE:  Procedure(s): Lumbar five Sacral one Anterior lumbar interbody fusion (N/A) Right Lumbar two-three, Lumbar three-four, Lumbar four-five Anterolateral lumbar interbody fusion (Right) Percutaneous pedicle screw fixation from Lumbar two to Sacral one  (N/A) ABDOMINAL EXPOSURE (N/A)  SURGEON:  Surgeon(s) and Role: Panel 1:    Erline Levine, MD - Primary Panel 2:    * Serafina Mitchell, MD - Primary  PHYSICIAN ASSISTANT: Marcello Moores, MD  ASSISTANTS: Poteat, RN   ANESTHESIA:   general  EBL:  250 mL   BLOOD ADMINISTERED:none  DRAINS: none   LOCAL MEDICATIONS USED:  MARCAINE    and LIDOCAINE   SPECIMEN:  No Specimen  DISPOSITION OF SPECIMEN:  N/A  COUNTS:  YES  TOURNIQUET:  * No tourniquets in log *  DICTATION: DICTATION:   INDICATIONS:  [name] is 80 year old female with chronic and intractable back and bilateral lower extremity pain, right greater than left, with degenerative lumbar stenosis and scoliosis.   It was elected to take her to surgery for anterior lateral lumbar decompression and fusion at the L 23, L 34, L45, and ALIF at L 5 S1 levels with percutaneous pedicle screw fixation.   PROCEDURE:  Doctor Trula Slade performed exposure and his portion of the procedure will be dictated separately.  Upon exposing the L 5 S1 level, a localizing X ray was obtained with the C arm.  I then incised the anterior annulus and performed a thorough discectomy with wide ligamentous releases.  The endplates were cleared of disc and cartilagenous material and a thorough discectomy was performed with decompression of the ventral annulus and disc material.  After trials, a 15 degree, 6 x 38  x 28 titanium lordotic ALIF cage was placed and lagged with 2, 5.0 x 20 mm screws, one in L 5 and one in S 1. This spacer which was , packed with extra extra small BMP and Attrax.  The implant was tamped into position and positioning was confirmed with C arm.   Locking mechanisms were engaged, soft tissues were inspected and found to be in good repair.   Fascia was closed with 1 PDS running stitch, skin edges closed with 2-0 and 3-0 vicryl sutures.  Wound was dressed with a sterile occlusive dressing.    Patient was then  placed in a left lateral decubitus position on the operative table and using orthogonally projected C-arm fluoroscopy the patient was placed so that the L 45 levels were visualized in AP and lateral plane. The patient was then taped into position.  Skin was marked along with a posterior finger dissection incision. Her flank was then prepped and draped in usual sterile fashion and incisions were made sequentially at L 45 level along the iliac crest and then at L3  working between the 11 th and 12 th ribs. Finger dissection was made to enter the retroperitoneal space and then subsequently the probe was inserted into the psoas muscle from the right side initially at the L45 level. After mapping the neural elements were able to dock the probe per the midpoint of this vertebral level and without indications electrically of too close proximity to the neural tissues. Subsequently the self-retaining tractor was.after sequential dilators were utilized the shim was employed  and the interspace was cleared of psoas muscle and then incised. A thorough discectomy was performed. Instruments were used to clear the interspace of disc material. After thorough discectomy was performed and this was performed using AP and lateral fluoroscopy a 10 lordotic by 50 x 22 mm implant was packed with extra small BMP and Attrax. This was tamped into position and its position was confirmed on AP and lateral fluoroscopy. Finger  dissection was made to enter the retroperitoneal space and then subsequently the probe was inserted into the psoas muscle from the right side at the L34 level. After mapping the neural elements were able to dock the probe per the midpoint of this vertebral level and without indications electrically of too close proximity to the neural tissues. Subsequently the self-retaining tractor was.after sequential dilators were utilized the shim was employed and the interspace was cleared of psoas muscle and then incised. A thorough discectomy was performed. Instruments were used to clear the interspace of disc material. After thorough discectomy was performed and this was performed using AP and lateral fluoroscopy a 10 lordotic by 45 x 22 mm implant was packed with BMP and Attrax. This was tamped into position and its position was confirmed on AP and lateral fluoroscopy.  Hemostasis was assured.   Finger dissection was made to enter the retroperitoneal space and then subsequently the probe was inserted into the psoas muscle from the right side at the L23 level. After mapping the neural elements were able to dock the probe per the midpoint of this vertebral level and without indications electrically of too close proximity to the neural tissues. Subsequently the self-retaining tractor was.after sequential dilators were utilized the shim was employed and the interspace was cleared of psoas muscle and then incised. A thorough discectomy was performed. Instruments were used to clear the interspace of disc material. After thorough discectomy was performed and this was performed using AP and lateral fluoroscopy an 8 lordotic by 18 x 45 mm implant was packed with remaining BMP and Attrax. This was tamped into position and its position was confirmed on AP and lateral fluoroscopy.  Hemostasis was assured at each level.  Incisions were closed with 0, 2-0, 3-0 vicryl sutures and dressed with Dermabond and an occlusive dressing.  The  patient was then turned into a prone position on the [place] table on chest rolls and using AP and lateral fluoroscopy throughout this portion of the procedure, pedicle screws were placed using Nuvasive cannulated percutaneous screws. After placing guide wires at each level with the use of nerve monitoring throughout, Nuvasive Reline towers were docked on the L2, L 3, L 4, L 5 S 1 levels.  Pedicle screws were placed bilaterally at L 2 (5.5 x 40), 2 at L 3 (5.5 x 40), 2 at L 4 and  L 5 (6.5 x 45) and S1 (6.5 x 35 at each level). 130 mm rods were then lordosed affixed to the screw heads and locked down on the screws. All connections were then torqued and the Towers were disassembled. The wounds were irrigated and then closed with 1, 2-0 and 3-0 Vicryl stitches. Long-acting Marcaine was infiltrated in subcutaneous tissues.  Sterile occlusive dressing was placed with Dermabond. The patient was then extubated in the operating room and taken to recovery in stable and satisfactory condition having tolerated her operation well. Counts were correct at the end of the case.  Pelvic Parameters:  Preop:  PT 13 degrees; PI 36 degrees; LL -46; PI- LL -11  degree; SVA 15 mm.  PLAN OF CARE: Admit to inpatient   PATIENT DISPOSITION:  PACU - hemodynamically stable.   Delay start of Pharmacological VTE agent (>24hrs) due to surgical blood loss or risk of bleeding: yes

## 2020-08-12 NOTE — Anesthesia Postprocedure Evaluation (Signed)
Anesthesia Post Note  Patient: Carol Perez  Procedure(s) Performed: Lumbar five Sacral one Anterior lumbar interbody fusion (N/A Abdomen) Right Lumbar two-three, Lumbar three-four, Lumbar four-five Anterolateral lumbar interbody fusion (Right Flank) Percutaneous pedicle screw fixation from Lumbar two to Sacral one  (N/A Back) ABDOMINAL EXPOSURE (N/A )     Patient location during evaluation: PACU Anesthesia Type: General Level of consciousness: awake Pain management: pain level controlled Vital Signs Assessment: post-procedure vital signs reviewed and stable Respiratory status: spontaneous breathing Cardiovascular status: stable Postop Assessment: no apparent nausea or vomiting Anesthetic complications: no   No complications documented.  Last Vitals:  Vitals:   08/12/20 1629 08/12/20 1636  BP: (!) 120/54   Pulse: 77 75  Resp: 16 12  Temp:    SpO2: 99% 97%    Last Pain:  Vitals:   08/12/20 1636  TempSrc:   PainSc: 5                  Jermario Kalmar

## 2020-08-12 NOTE — Progress Notes (Signed)
Awake, alert, sleepy, but arousable.  MAEW with good power.  Doing well.

## 2020-08-12 NOTE — Op Note (Signed)
08/12/2020  3:52 PM  PATIENT:  Carol Perez  80 y.o. female  PRE-OPERATIVE DIAGNOSIS:  Lumbago with sciatica, lumbar scoliosis, degenerative lumbar stenosis, lumbar radiculopathy   POST-OPERATIVE DIAGNOSIS:  Lumbago with sciatica, lumbar scoliosis, degenerative lumbar stenosis, lumbar radiculopathy   PROCEDURE:  Procedure(s): Lumbar five Sacral one Anterior lumbar interbody fusion (N/A) Right Lumbar two-three, Lumbar three-four, Lumbar four-five Anterolateral lumbar interbody fusion (Right) Percutaneous pedicle screw fixation from Lumbar two to Sacral one  (N/A) ABDOMINAL EXPOSURE (N/A)  SURGEON:  Surgeon(s) and Role: Panel 1:    Erline Levine, MD - Primary Panel 2:    * Serafina Mitchell, MD - Primary  PHYSICIAN ASSISTANT: Marcello Moores, MD  ASSISTANTS: Poteat, RN   ANESTHESIA:   general  EBL:  250 mL   BLOOD ADMINISTERED:none  DRAINS: none   LOCAL MEDICATIONS USED:  MARCAINE    and LIDOCAINE   SPECIMEN:  No Specimen  DISPOSITION OF SPECIMEN:  N/A  COUNTS:  YES  TOURNIQUET:  * No tourniquets in log *  DICTATION: DICTATION:   INDICATIONS:  [name] is 80 year old female with chronic and intractable back and bilateral lower extremity pain, right greater than left, with degenerative lumbar stenosis and scoliosis.   It was elected to take her to surgery for anterior lateral lumbar decompression and fusion at the L 23, L 34, L45, and ALIF at L 5 S1 levels with percutaneous pedicle screw fixation.   PROCEDURE:  Doctor Trula Slade performed exposure and his portion of the procedure will be dictated separately.  Upon exposing the L 5 S1 level, a localizing X ray was obtained with the C arm.  I then incised the anterior annulus and performed a thorough discectomy with wide ligamentous releases.  The endplates were cleared of disc and cartilagenous material and a thorough discectomy was performed with decompression of the ventral annulus and disc material.  After trials, a 15 degree, 6 x 38  x 28 titanium lordotic ALIF cage was placed and lagged with 2, 5.0 x 20 mm screws, one in L 5 and one in S 1. This spacer which was , packed with extra extra small BMP and Attrax.  The implant was tamped into position and positioning was confirmed with C arm.   Locking mechanisms were engaged, soft tissues were inspected and found to be in good repair.   Fascia was closed with 1 PDS running stitch, skin edges closed with 2-0 and 3-0 vicryl sutures.  Wound was dressed with a sterile occlusive dressing.    Patient was then  placed in a left lateral decubitus position on the operative table and using orthogonally projected C-arm fluoroscopy the patient was placed so that the L 45 levels were visualized in AP and lateral plane. The patient was then taped into position.  Skin was marked along with a posterior finger dissection incision. Her flank was then prepped and draped in usual sterile fashion and incisions were made sequentially at L 45 level along the iliac crest and then at L3  working between the 11 th and 12 th ribs. Finger dissection was made to enter the retroperitoneal space and then subsequently the probe was inserted into the psoas muscle from the right side initially at the L45 level. After mapping the neural elements were able to dock the probe per the midpoint of this vertebral level and without indications electrically of too close proximity to the neural tissues. Subsequently the self-retaining tractor was.after sequential dilators were utilized the shim was employed  and the interspace was cleared of psoas muscle and then incised. A thorough discectomy was performed. Instruments were used to clear the interspace of disc material. After thorough discectomy was performed and this was performed using AP and lateral fluoroscopy a 10 lordotic by 50 x 22 mm implant was packed with extra small BMP and Attrax. This was tamped into position and its position was confirmed on AP and lateral fluoroscopy. Finger  dissection was made to enter the retroperitoneal space and then subsequently the probe was inserted into the psoas muscle from the right side at the L34 level. After mapping the neural elements were able to dock the probe per the midpoint of this vertebral level and without indications electrically of too close proximity to the neural tissues. Subsequently the self-retaining tractor was.after sequential dilators were utilized the shim was employed and the interspace was cleared of psoas muscle and then incised. A thorough discectomy was performed. Instruments were used to clear the interspace of disc material. After thorough discectomy was performed and this was performed using AP and lateral fluoroscopy a 10 lordotic by 45 x 22 mm implant was packed with BMP and Attrax. This was tamped into position and its position was confirmed on AP and lateral fluoroscopy.  Hemostasis was assured.   Finger dissection was made to enter the retroperitoneal space and then subsequently the probe was inserted into the psoas muscle from the right side at the L23 level. After mapping the neural elements were able to dock the probe per the midpoint of this vertebral level and without indications electrically of too close proximity to the neural tissues. Subsequently the self-retaining tractor was.after sequential dilators were utilized the shim was employed and the interspace was cleared of psoas muscle and then incised. A thorough discectomy was performed. Instruments were used to clear the interspace of disc material. After thorough discectomy was performed and this was performed using AP and lateral fluoroscopy an 8 lordotic by 18 x 45 mm implant was packed with remaining BMP and Attrax. This was tamped into position and its position was confirmed on AP and lateral fluoroscopy.  Hemostasis was assured at each level.  Incisions were closed with 0, 2-0, 3-0 vicryl sutures and dressed with Dermabond and an occlusive dressing.  The  patient was then turned into a prone position on the Mildred table on chest rolls and using AP and lateral fluoroscopy throughout this portion of the procedure, pedicle screws were placed using Nuvasive cannulated percutaneous screws. After placing guide wires at each level with the use of nerve monitoring throughout, Nuvasive Reline towers were docked on the L2, L 3, L 4, L 5 S 1 levels.  Pedicle screws were placed bilaterally at L 2 (5.5 x 40), 2 at L 3 (5.5 x 40), 2 at L 4 and  L 5 (6.5 x 45) and S1 (6.5 x 35 at each level). 130 mm rods were then lordosed affixed to the screw heads and locked down on the screws. All connections were then torqued and the Towers were disassembled. The wounds were irrigated and then closed with 1, 2-0 and 3-0 Vicryl stitches. Long-acting Marcaine was infiltrated in subcutaneous tissues.  Sterile occlusive dressing was placed with Dermabond. The patient was then extubated in the operating room and taken to recovery in stable and satisfactory condition having tolerated her operation well. Counts were correct at the end of the case.  Pelvic Parameters:  Preop:  PT 13 degrees; PI 36 degrees; LL -46; PI- LL -11  degree; SVA 15 mm.  PLAN OF CARE: Admit to inpatient   PATIENT DISPOSITION:  PACU - hemodynamically stable.   Delay start of Pharmacological VTE agent (>24hrs) due to surgical blood loss or risk of bleeding: yes

## 2020-08-12 NOTE — Anesthesia Procedure Notes (Signed)
Arterial Line Insertion Performed by: Cleda Daub, CRNA  Patient location: Pre-op. Preanesthetic checklist: patient identified, IV checked, site marked, risks and benefits discussed, surgical consent, monitors and equipment checked, pre-op evaluation, timeout performed and anesthesia consent Lidocaine 1% used for infiltration Right, radial was placed Catheter size: 20 Fr Hand hygiene performed  and maximum sterile barriers used   Attempts: 1 Procedure performed without using ultrasound guided technique. Ultrasound Notes:anatomy identified, needle tip was noted to be adjacent to the nerve/plexus identified and no ultrasound evidence of intravascular and/or intraneural injection Following insertion, dressing applied. Post procedure assessment: normal and unchanged  Patient tolerated the procedure well with no immediate complications.

## 2020-08-12 NOTE — Transfer of Care (Signed)
Immediate Anesthesia Transfer of Care Note  Patient: Carol Perez  Procedure(s) Performed: Lumbar five Sacral one Anterior lumbar interbody fusion (N/A Abdomen) Right Lumbar two-three, Lumbar three-four, Lumbar four-five Anterolateral lumbar interbody fusion (Right Flank) Percutaneous pedicle screw fixation from Lumbar two to Sacral one  (N/A Back) ABDOMINAL EXPOSURE (N/A )  Patient Location: PACU  Anesthesia Type:General  Level of Consciousness: awake, alert , oriented and patient cooperative  Airway & Oxygen Therapy: Patient Spontanous Breathing and Patient connected to face mask oxygen  Post-op Assessment: Report given to RN and Post -op Vital signs reviewed and stable  Post vital signs: Reviewed and stable  Last Vitals:  Vitals Value Taken Time  BP 119/68 08/12/20 1559  Temp    Pulse 78 08/12/20 1602  Resp 13 08/12/20 1602  SpO2 100 % 08/12/20 1602  Vitals shown include unvalidated device data.  Last Pain:  Vitals:   08/12/20 0557  TempSrc: Oral  PainSc:       Patients Stated Pain Goal: 2 (37/34/28 7681)  Complications: No complications documented.

## 2020-08-12 NOTE — Interval H&P Note (Signed)
History and Physical Interval Note:  08/12/2020 7:22 AM  Carol Perez  has presented today for surgery, with the diagnosis of Lumbago with sciatica.  The various methods of treatment have been discussed with the patient and family. After consideration of risks, benefits and other options for treatment, the patient has consented to  Procedure(s): Lumbar 5 Sacral 1 Anterior lumbar interbody fusion (N/A) Right Lumbar 2-3, Lumbar 3-4, Lumbar 4-5 Anterolateral lumbar interbody fusion with percutaneous pedicle screw fixation from Lumbar 2 to Sacral 1 (Right) LUMBAR PERCUTANEOUS PEDICLE SCREW 4 LEVEL (N/A) ABDOMINAL EXPOSURE (N/A) as a surgical intervention.  The patient's history has been reviewed, patient examined, no change in status, stable for surgery.  I have reviewed the patient's chart and labs.  Questions were answered to the patient's satisfaction.     [name]

## 2020-08-12 NOTE — Op Note (Signed)
    Patient name: ANARA COWMAN MRN: [id number] DOB: December 10, 1940 Sex: female  08/12/2020 Pre-operative Diagnosis: Degenerative back disease Post-operative diagnosis:  Same Surgeon:  Annamarie Major  Co-surgeon: Dierdre Harness Assistants: Humberto Seals Procedure:   Anterior exposure L5-S1 Anesthesia: General Blood Loss: Minimal Specimens: None  Findings: Normal anatomy  Indications: This is a 80 year old female requiring back surgery and anterior exposure of the L5-S1 disc space.  The risks and benefits of the procedure were discussed with the patient at the preoperative visit.  All questions were answered and she wished to proceed.  Procedure:  The patient was identified in the holding area and taken to [place] [facility]  The patient was then placed supine on the table. general anesthesia was administered.  The patient was prepped and draped in the usual sterile fashion.  A time out was called and antibiotics were administered.  Fluoroscopy was used to determine the appropriate level of skin incision.  A transverse left lower quadrant incision was made below the umbilicus.  Cautery was used to divide the subcutaneous tissue.  The fascia was then exposed and divided with cautery.  Subfascial flaps were then raised.  I then entered the retroperitoneal space, lateral to the rectus muscle.  A plane was then developed in the abdominal contents were swept superiorly and medially.  I exposed opposed the iliac artery and vein and proceeded with cephalad dissection.  The ureter was identified and protected.  I identified the median sacral vessels and divided these between silk ties.  The iliac vein was fully mobilized.  Next, the NuVasive retractor was positioned.  I placed a 140 blade on the right lateral side of the spine.  I then further mobilized the iliac vein so that a retractor could be placed on the left lateral side of the spine.  Additional 140 retractor blades were then placed superiorly and inferiorly.  A spinal  needle was then inserted in the disc space and fluoroscopy confirmed that we were at the appropriate level.  At this point, Dr. Vertell Limber came in and performed his portion of the procedure.  Please see his separate operative note for full details.  There were no complications from my portion of the procedure.   Disposition: To PACU stable   V. Annamarie Major, M.D., [facility] [place] Office: [phone] Pager:  [phone]

## 2020-08-12 NOTE — H&P (Signed)
[facility] [place]  Patient name: Carol Perez  MRN: [id number]        DOB: Jul 16, 1940            Sex: female   REQUESTING PROVIDER:    Dr. Vertell Limber   REASON FOR CONSULT:    Anterior exposure  HISTORY OF PRESENT ILLNESS:   Carol Perez is a 80 y.o. female, who is referrer for evaluation of anterior exposure of L5-S1 for spinal instrumentation.  She has a left leg radiculopathy.  She has failed nonoperative management and is being considered for surgical intervention.  She has not had abdominal surgery.  She did have a transvaginal hysterectomy.  She is a former smoker.  The patient states that she suffers from Raynaud's disease.  PAST MEDICAL HISTORY        Past Medical History:  Diagnosis Date  . Barrett's esophagus   . Colitis   . DDD (degenerative disc disease)   . Erroneous encounter - disregard    error   . Erroneous encounter - disregard   . Erroneous encounter - disregard   . Factor 5 Leiden mutation, heterozygous [facility])    Per [place] Patient Packet.  . Frequent UTI   . H/O echocardiogram 04/01/2019   Per [place] Patient Packet.  . Hiatal hernia    Per [place] Patient Packet.  . High grade dysplasia of Barrett's epithelium    Per [facility] New Patient Packet.  Marland Kitchen History of bladder infections    Per [facility] New Patient Packet.  Marland Kitchen History of bone density study 07/12/2018   Per [place] Patient Packet.  Marland Kitchen History of bone density study 07/12/2018   Up coming appointment 07/17/2019, Per [facility] New Patient Packet  . History of bone density study 07/17/2019  . History of endoscopy 12/17/2015   Needed Every 3 years. Scheduled for January 2021. Per [facility] New Patient Packet.   Marland Kitchen History of mammogram 04/14/2019   Per [place] Patient Packet.  Marland Kitchen History of MRI 03/01/2019   By Dr.Gulati/ Neurologist. MRI of Brain. Per [facility] New Patient Packet.  Marland Kitchen History of MRI 04/23/2019   By Dr. Maryan Rued at [facility]. Per [facility] New  Patient Packet.  Marland Kitchen History of Papanicolaou smear of cervix 10/16/2011   Per [place] Patient Packet  . Hypotension    Per [facility] New Patient Packet.  . IBS (irritable bowel syndrome)    Per [place] New Patient Packet.  . Lactose intolerance   . Osteoarthritis    Per [facility] New Patient Packet.  Marland Kitchen Osteopenia    Per [place] New Patient Packet.  . Osteoporosis   . Premature ventricular complex   . Raynaud's disease    Per [facility] New Patient Packet.  . Scoliosis    Per [place] New Patient Packet.     FAMILY HISTORY        Family History  Problem Relation Age of Onset  . Cancer Mother   . Cancer Father     SOCIAL HISTORY:   Social History        Socioeconomic History  . Marital status: Married    Spouse name: Not on file  . Number of children: 2  . Years of education: Not on file  . Highest education level: Associate degree: occupational, Hotel manager, or vocational program  Occupational History  . Occupation: Retired   Tobacco Use  . Smoking status: Former Smoker    Start date: 06/12/1977  . Smokeless  tobacco: Never Used  . Tobacco comment: 42 years ago. Smoked from age [age]  Vaping Use  . Vaping Use: Never used  Substance and Sexual Activity  . Alcohol use: Not Currently    Alcohol/week: 7.0 standard drinks    Types: 7 Glasses of wine per week    Comment: was a glass of wine daily  . Drug use: No  . Sexual activity: Not on file  Other Topics Concern  . Not on file  Social History Narrative   Tobacco use, amount per day now: No   Past tobacco use, amount per day: Smoked ages [age]   How many years did you use tobacco: 17-20 years   Alcohol use (drinks per week): 1 glass of Red Wine per day.   Diet: Good   Do you drink/eat things with caffeine: No   Marital status: Married                                  What year were you married? 1978   Do you live in a house, apartment, assisted living, condo, trailer, etc.? Apartment/Independent  Living   Is it one or more stories? 1   How many persons live in your home? 2    Do you have pets in your home?( please list) No   Highest Level of Education completed? 2 years of college.    Current or past profession: Retired Futures trader   Do you exercise?  Yes                                Type and how often? 55min workout 5 days week. Walking 3-4 Days a Week.    Do you have a living will? Yes   Do you have a DNR form?  Yes                                 If not, do you want to discuss one?   Do you have signed POA/HPOA forms? Yes                       If so, please bring to you appointment      Do you have difficulty bathing or dressing yourself? No   Do you have difficulty preparing food or eating? No   Do you have difficulty managing your medications? No   Do you have difficulty managing your finances? No   Do you have difficulty affording your medications? No       Per [facility] New Patient Packet. Abstracted by Jasmine/RMA.    Social Determinants of Health   Financial Resource Strain: Not on file  Food Insecurity: Not on file  Transportation Needs: Not on file  Physical Activity: Not on file  Stress: Not on file  Social Connections: Not on file  Intimate Partner Violence: Not on file    ALLERGIES:         Allergies  Allergen Reactions  . Aspirin Other (See Comments)    Upsets ulcers  . Avelox [Moxifloxacin]   . Banana     Swelling around mouth and eyes and red blotches  . Ciprofloxacin Hcl   . Levofloxacin   . Norfloxacin   . Nsaids Other (See Comments)    Upsets ulcers  .  Ofloxacin   . Other     FLOXIN  . Penicillins Hives  . Tape   . Tequin [Gatifloxacin]   . Latex Swelling and Rash  . Methylisothiazolinone Rash    CURRENT MEDICATIONS:          Current Outpatient Medications  Medication Sig Dispense Refill  . aspirin EC 81 MG tablet Take 81 mg by mouth in the morning and at bedtime.     . B  Complex Vitamins (VITAMIN B-COMPLEX) TABS Take 1 tablet by mouth daily. With vitamin C    . Biotin 5000 MCG TABS Take 1 tablet by mouth daily.    Marland Kitchen CALCIUM PO Take 1,200 mg by mouth daily.     . Cholecalciferol (D3-1000 PO) Take by mouth daily. 2,000 IU    . conjugated estrogens (PREMARIN) vaginal cream Place 1 Applicatorful vaginally 3 (three) times a week. 42.5 g 12  . gabapentin (NEURONTIN) 100 MG capsule Take 100 mg by mouth daily as needed for pain.    Marland Kitchen Lifitegrast (XIIDRA OP) Apply 1 drop to eye 2 (two) times daily.     Marland Kitchen loperamide (IMODIUM) 2 MG capsule Take 2 mg by mouth as needed for diarrhea or loose stools.    . methenamine (HIPREX) 1 g tablet Take 1 g by mouth 2 (two) times daily with a meal. For UTI Prevention. Last UTI: October 20th, November 5th, and December 28th.     . Methenamine-Sodium Salicylate (CYSTEX PO) Take 100 mg by mouth daily. ( Cranberry Concentrate )    . Multiple Vitamins-Minerals (ONE-A-DAY WOMENS PO) Take 1 tablet by mouth daily.    . NON FORMULARY CBD Full spectrum 1 ml as needed for sleep.    Marland Kitchen Omeprazole-Sodium Bicarbonate (ZEGERID PO) Take 20 mg by mouth 2 (two) times daily.    . Polyvinyl Alcohol-Povidone (REFRESH OP) Apply 1 drop to eye as needed.    . Probiotic Product (PROBIOTIC COLON SUPPORT PO) Take 1 tablet by mouth daily.    . traZODone (DESYREL) 50 MG tablet TAKE 1/2 TO 1 TABLET AT BEDTIME AS NEEDED. 30 tablet 0  . valACYclovir (VALTREX) 500 MG tablet TAKE (1) TABLET DAILY AS NEEDED. 90 tablet 3   No current facility-administered medications for this visit.    REVIEW OF SYSTEMS:   [X]  denotes positive finding, [ ]  denotes negative finding Cardiac  Comments:  Chest pain or chest pressure:    Shortness of breath upon exertion:    Short of breath when lying flat:    Irregular heart rhythm:        Vascular    Pain in calf, thigh, or hip brought on by ambulation:    Pain in feet at night that  wakes you up from your sleep:     Blood clot in your veins:    Leg swelling:         Pulmonary    Oxygen at home:    Productive cough:     Wheezing:         Neurologic    Sudden weakness in arms or legs:     Sudden numbness in arms or legs:     Sudden onset of difficulty speaking or slurred speech:    Temporary loss of vision in one eye:     Problems with dizziness:         Gastrointestinal    Blood in stool:      Vomited blood:         Genitourinary  Burning when urinating:     Blood in urine:        Psychiatric    Major depression:         Hematologic    Bleeding problems:    Problems with blood clotting too easily:        Skin    Rashes or ulcers:        Constitutional    Fever or chills:     PHYSICAL EXAM:   There were no vitals filed for this visit.  GENERAL: The patient is a well-nourished female, in no acute distress. The vital signs are documented above. CARDIAC: There is a regular rate and rhythm.  VASCULAR: Palpable pedal pulses PULMONARY: Nonlabored respirations ABDOMEN: Soft and non-tender  MUSCULOSKELETAL: There are no major deformities or cyanosis. NEUROLOGIC: No focal weakness or paresthesias are detected. SKIN: There are no ulcers or rashes noted. PSYCHIATRIC: The patient has a normal affect.  STUDIES:   I have reviewed her MRI detailing her lower back disease.  ASSESSMENT and PLAN   Degenerative back disease: I discussed my role in a anterior exposure for L5-S1 instrumentation.  We discussed potential risk including injury to the iliac artery or vein as well as the ureter.  We also discussed the risk of hernia and wound infection.  All of their questions were answered.   Leia Alf, MD, FACS [facility] [phone] Pager [phone]

## 2020-08-12 NOTE — Anesthesia Procedure Notes (Signed)
Procedure Name: Intubation Performed by: Cleda Daub, CRNA Pre-anesthesia Checklist: Patient identified, Emergency Drugs available, Suction available and Patient being monitored Patient Re-evaluated:Patient Re-evaluated prior to induction Oxygen Delivery Method: Circle system utilized Preoxygenation: Pre-oxygenation with 100% oxygen Induction Type: IV induction Ventilation: Mask ventilation without difficulty Laryngoscope Size: Mac and 3 Grade View: Grade I Tube type: Oral Tube size: 7.0 mm Number of attempts: 1 Airway Equipment and Method: Stylet and Oral airway Placement Confirmation: ETT inserted through vocal cords under direct vision,  positive ETCO2 and breath sounds checked- equal and bilateral Secured at: 21 cm Tube secured with: Tape Dental Injury: Teeth and Oropharynx as per pre-operative assessment

## 2020-08-12 NOTE — Progress Notes (Signed)
Pharmacy Antibiotic Note  Carol Perez is a 80 y.o. female s/p spinal procedure.  Pharmacy has been consulted for vancomycin for surgical prophylaxis -vancomycin 1000mg  IV given ~ 7am today -no drain in place  Plan: -Vancomycin 500mg  IV x1 tonight Will sign off. Please contact pharmacy with any other needs.  Thank you Hildred Laser, PharmD Clinical Pharmacist **Pharmacist phone directory can now be found on [email or web address] ([email or web address]).  Listed under [place].

## 2020-08-12 NOTE — Progress Notes (Signed)
Patient was able to get off of stretcher and into a recliner with 2 person assistance.  She was able to tolerate being in a recliner until 1945.  She was up and in recliner for 1 hour, before requesting to be returned to a bed and requesting administration of pain medication.

## 2020-08-13 ENCOUNTER — Encounter (HOSPITAL_COMMUNITY): Payer: Self-pay | Admitting: Neurosurgery

## 2020-08-13 MED ORDER — SODIUM CHLORIDE 0.9 % IV BOLUS
500.0000 mL | Freq: Once | INTRAVENOUS | Status: AC
  Administered 2020-08-13: 500 mL via INTRAVENOUS

## 2020-08-13 MED ORDER — TAMSULOSIN HCL 0.4 MG PO CAPS
0.4000 mg | ORAL_CAPSULE | Freq: Every day | ORAL | Status: DC
  Administered 2020-08-13 – 2020-08-23 (×11): 0.4 mg via ORAL
  Filled 2020-08-13 (×11): qty 1

## 2020-08-13 NOTE — Evaluation (Signed)
Physical Therapy Evaluation Patient Details Name: Carol Perez MRN: [id number] DOB: 08/30/1940 Today's Date: 08/13/2020   History of Present Illness  80 y.o. female presents on 08/12/2020 for L5-S1 ALIF and R L2-5 anterolateral lumbar interbody fusion due to low back pain with LLE radiculopathy. PMH includes ostropenia.  Clinical Impression  Pt presents to PT with deficits in strength, power, gait, functional mobility, balance. Pt is limited by low back pain and requires frequent cues for hand placement and DME use. PT also requires assistance to power into standing due to LE strength deficits. Pt will benefit from PT POC to improve mobility and reduce falls risk. PT recommends SNF placement to aide in a return to independent mobility.    Follow Up Recommendations SNF (per pt discharge [facility])    Equipment Recommendations  Rolling walker with 5" wheels    Recommendations for Other Services       Precautions / Restrictions Precautions Precautions: Fall;Back Precaution Booklet Issued: Yes (comment) Required Braces or Orthoses: Spinal Brace Spinal Brace: Lumbar corset;Applied in sitting position Restrictions Weight Bearing Restrictions: No      Mobility  Bed Mobility Overal bed mobility: Needs Assistance Bed Mobility: Rolling;Sidelying to Sit Rolling: Min assist Sidelying to sit: Min assist       General bed mobility comments: cues for log roll    Transfers Overall transfer level: Needs assistance   Transfers: Sit to/from Stand;Stand Pivot Transfers Sit to Stand: Min assist;Mod assist Stand pivot transfers: Min assist       General transfer comment: modA initially improving to minA to power up into standing  Ambulation/Gait Ambulation/Gait assistance: Min assist Gait Distance (Feet): 3 Feet Assistive device: Rolling walker (2 wheeled) Gait Pattern/deviations: Step-to pattern Gait velocity: reduced Gait velocity interpretation:  <1.31 ft/sec, indicative of household ambulator General Gait Details: pt with short step-to gait, reduced step length  Stairs            Wheelchair Mobility    Modified Rankin (Stroke Patients Only)       Balance Overall balance assessment: Needs assistance Sitting-balance support: No upper extremity supported;Feet supported Sitting balance-Leahy Scale: Fair     Standing balance support: Bilateral upper extremity supported Standing balance-Leahy Scale: Poor Standing balance comment: reliant on BUE support of RW                             Pertinent Vitals/Pain Pain Assessment: Faces Faces Pain Scale: Hurts whole lot Pain Location: low back Pain Descriptors / Indicators: Grimacing Pain Intervention(s): Monitored during session    Home Living Family/patient expects to be discharged to:: Private residence Living Arrangements: Spouse/significant other Available Help at Discharge: Family;Available 24 hours/day Type of Home: Assisted living (friends home) Home Access: Level entry     Home Layout: One level Home Equipment: Cane - single point;Walker - standard      Prior Function Level of Independence: Independent with assistive device(s)         Comments: pt ambulates with use of cane     Hand Dominance        Extremity/Trunk Assessment   Upper Extremity Assessment Upper Extremity Assessment: Generalized weakness    Lower Extremity Assessment Lower Extremity Assessment: Generalized weakness    Cervical / Trunk Assessment Cervical / Trunk Assessment: Other exceptions Cervical / Trunk Exceptions: s/p low ALIF  Communication   Communication: No difficulties  Cognition Arousal/Alertness: Awake/alert Behavior During Therapy:  WFL for tasks assessed/performed Overall Cognitive Status: Within Functional Limits for tasks assessed                                        General Comments General comments (skin integrity,  edema, etc.): VSS on RA    Exercises     Assessment/Plan    PT Assessment Patient needs continued PT services  PT Problem List Decreased strength;Decreased activity tolerance;Decreased balance;Decreased mobility;Decreased knowledge of use of DME;Decreased safety awareness;Decreased knowledge of precautions;Pain       PT Treatment Interventions DME instruction;Gait training;Functional mobility training;Therapeutic activities;Therapeutic exercise;Balance training;Neuromuscular re-education;Patient/family education    PT Goals (Current goals can be found in the Care Plan section)  Acute Rehab PT Goals Patient Stated Goal: to return to independent mobility PT Goal Formulation: With patient Time For Goal Achievement: 08/27/20 Potential to Achieve Goals: Good    Frequency Min 5X/week   Barriers to discharge        Co-evaluation               AM-PAC PT "6 Clicks" Mobility  Outcome Measure Help needed turning from your back to your side while in a flat bed without using bedrails?: A Little Help needed moving from lying on your back to sitting on the side of a flat bed without using bedrails?: A Little Help needed moving to and from a bed to a chair (including a wheelchair)?: A Little Help needed standing up from a chair using your arms (e.g., wheelchair or bedside chair)?: A Little Help needed to walk in hospital room?: A Lot Help needed climbing 3-5 steps with a railing? : Total 6 Click Score: 15    End of Session Equipment Utilized During Treatment: Back brace Activity Tolerance: Patient tolerated treatment well Patient left: in chair;with call bell/phone within reach;with chair alarm set Nurse Communication: Mobility status PT Visit Diagnosis: Other abnormalities of gait and mobility (R26.89);Muscle weakness (generalized) (M62.81);Pain Pain - part of body:  (back)    Time: 9373-4287 PT Time Calculation (min) (ACUTE ONLY): 39 min   Charges:   PT Evaluation $PT  Eval Low Complexity: 1 Low PT Treatments $Therapeutic Activity: 8-22 mins        Zenaida Niece, PT, DPT Acute Rehabilitation Pager: [phone]   Zenaida Niece 08/13/2020, 8:56 AM

## 2020-08-13 NOTE — Progress Notes (Addendum)
1100 PT did not void for 4 hours after foley cath was removed and complained of tense, distended bladder. I did a bladder scan three times and could only scan 50 ml of urine located in the bladder. I performed an intermittent catheterization and removed 200 ml of urine from her bladder. Patient wants her urologist to be notified. MD is aware of patients problem urinating.  1400 PT tried sitting on toilet again to void during therapy but was still unable to void. Patient is sitting upright in chair with legs down and pillows behind her head & back.  Shelbie Proctor, RN

## 2020-08-13 NOTE — Evaluation (Signed)
Occupational Therapy Evaluation Patient Details Name: Carol Perez MRN: [id number] DOB: April 05, 1941 Today's Date: 08/13/2020    History of Present Illness 80 y.o. female presents on 08/12/2020 for L5-S1 ALIF and R L2-5 anterolateral lumbar interbody fusion due to low back pain with LLE radiculopathy. PMH includes ostropenia.   Clinical Impression   Pt admitted with the above diagnoses and presents with below problem list. Pt will benefit from continued acute OT to address the below listed deficits and maximize independence with basic ADLs prior to d/c to venue below. At baseline, pt is mod with ADLs, uses a cane. Pt currently needs min A with UB ADLs, mod A with LB ADLs, min A with functional transfers (ex. Toilet), min guard with short distance mobility. Of note, pt became lightheaded during session. Cued for seated rest break to assess and recover. BP assessed 114/52. Nursing notified and present at end of session    Follow Up Recommendations  SNF    Equipment Recommendations  Other (comment) (defer to next venue)    Recommendations for Other Services       Precautions / Restrictions Precautions Precautions: Fall;Back Precaution Booklet Issued: Yes (comment) Required Braces or Orthoses: Spinal Brace Spinal Brace: Lumbar corset;Applied in sitting position Restrictions Weight Bearing Restrictions: No      Mobility Bed Mobility Overal bed mobility: Needs Assistance Bed Mobility: Rolling;Sidelying to Sit Rolling: Min assist Sidelying to sit: Min assist       General bed mobility comments: cues and some physical assist    Transfers Overall transfer level: Needs assistance   Transfers: Sit to/from Stand;Stand Pivot Transfers Sit to Stand: Min assist Stand pivot transfers: Min assist       General transfer comment: cues needed for technique with rw and bacl precautions. Min A to steady and light assist to fully powerup to standing. to/from EOB, 3n1, and recliner     Balance Overall balance assessment: Needs assistance Sitting-balance support: No upper extremity supported;Feet supported Sitting balance-Leahy Scale: Fair     Standing balance support: Bilateral upper extremity supported Standing balance-Leahy Scale: Poor Standing balance comment: reliant on BUE support of RW                           ADL either performed or assessed with clinical judgement   ADL Overall ADL's : Needs assistance/impaired Eating/Feeding: Set up;Sitting   Grooming: Set up;Sitting   Upper Body Bathing: Minimal assistance;Sitting   Lower Body Bathing: Moderate assistance;Sit to/from stand   Upper Body Dressing : Minimal assistance;Sitting   Lower Body Dressing: Moderate assistance;Sit to/from stand   Toilet Transfer: Minimal assistance;Ambulation;RW (3n1 over toilet)   Toileting- Clothing Manipulation and Hygiene: Moderate assistance;Sitting/lateral lean;Sit to/from stand       Functional mobility during ADLs: Minimal assistance;Rolling walker General ADL Comments: Pt completed bed mobility, walked to bathroom to attempt to void then walked to the sink. Became lightheaded at this point and after seated rest break SPT to recliner.     Vision         Perception     Praxis      Pertinent Vitals/Pain Faces Pain Scale: Hurts even more Pain Location: low back Pain Descriptors / Indicators: Grimacing Pain Intervention(s): Monitored during session;Limited activity within patient's tolerance;Repositioned     Hand Dominance     Extremity/Trunk Assessment Upper Extremity Assessment Upper Extremity Assessment: Generalized weakness   Lower Extremity Assessment Lower Extremity Assessment: Defer to PT evaluation   Cervical /  Trunk Assessment Cervical / Trunk Assessment: Other exceptions Cervical / Trunk Exceptions: s/p low ALIF   Communication Communication Communication: No difficulties   Cognition Arousal/Alertness: Awake/alert Behavior  During Therapy: WFL for tasks assessed/performed Overall Cognitive Status: Within Functional Limits for tasks assessed                                     General Comments  Pt became lightheaded during session. Cued for seated rest break to assess and recover. BP assessed 114/52. Nursing notified and present at end of session.    Exercises     Shoulder Instructions      Home Living Family/patient expects to be discharged to:: Private residence Living Arrangements: Spouse/significant other Available Help at Discharge: Family;Available 24 hours/day Type of Home: Assisted living (friends home) Home Access: Level entry     Home Layout: One level     Bathroom Shower/Tub: Walk-in shower         Home Equipment: Cane - single point;Walker - standard          Prior Functioning/Environment Level of Independence: Independent with assistive device(s)        Comments: pt ambulates with use of cane        OT Problem List: Decreased strength;Decreased activity tolerance;Impaired balance (sitting and/or standing);Decreased knowledge of use of DME or AE;Decreased knowledge of precautions;Pain      OT Treatment/Interventions: Self-care/ADL training;DME and/or AE instruction;Therapeutic activities;Patient/family education;Balance training    OT Goals(Current goals can be found in the care plan section) Acute Rehab OT Goals Patient Stated Goal: to return to independent mobility OT Goal Formulation: With patient/family Time For Goal Achievement: 08/27/20 Potential to Achieve Goals: Good ADL Goals Pt Will Perform Grooming: with modified independence;standing;sitting Pt Will Perform Lower Body Bathing: with modified independence;sit to/from stand Pt Will Perform Lower Body Dressing: with modified independence;sit to/from stand Pt Will Transfer to Toilet: with modified independence;ambulating Pt Will Perform Toileting - Clothing Manipulation and hygiene: with modified  independence;sit to/from stand;with adaptive equipment Additional ADL Goal #1: Pt will complete bed mobility at supervision level to prepare for EOB/OOB ADLs.  OT Frequency: Min 2X/week   Barriers to D/C:            Co-evaluation              AM-PAC OT "6 Clicks" Daily Activity     Outcome Measure Help from another person eating meals?: None Help from another person taking care of personal grooming?: A Little Help from another person toileting, which includes using toliet, bedpan, or urinal?: A Lot Help from another person bathing (including washing, rinsing, drying)?: A Lot Help from another person to put on and taking off regular upper body clothing?: A Little Help from another person to put on and taking off regular lower body clothing?: A Lot 6 Click Score: 16   End of Session Equipment Utilized During Treatment: Rolling walker;Back brace Nurse Communication: Other (comment);Mobility status (lightheaded)  Activity Tolerance: Other (comment) (lightheaded during session) Patient left: in chair;with call bell/phone within reach;with nursing/sitter in room  OT Visit Diagnosis: Unsteadiness on feet (R26.81);Pain;Muscle weakness (generalized) (M62.81)                Time: 8502-7741 OT Time Calculation (min): 37 min Charges:  OT General Charges $OT Visit: 1 Visit OT Evaluation $OT Eval Moderate Complexity: 1 Mod OT Treatments $Self Care/Home Management : 8-22 mins  Curt Bears  Rodena Piety, OT Acute Rehabilitation Services Pager: [phone] Office: [phone]  Hortencia Pilar 08/13/2020, 2:41 PM

## 2020-08-13 NOTE — Progress Notes (Signed)
Subjective: Patient reports incisional and abdominal pain. She has no other complaints. She is NAD and no acute events were reported overnight.   Objective: Vital signs in last 24 hours: Temp:  [97.4 F (36.3 C)-99 F (37.2 C)] 99 F (37.2 C) (03/04 0625) Pulse Rate:  [69-84] 80 (03/04 0625) Resp:  [11-19] 16 (03/04 0625) BP: (113-141)/(48-76) 113/48 (03/04 0625) SpO2:  [97 %-100 %] 98 % (03/04 0625) Arterial Line BP: (127-166)/(51-67) 166/66 (03/03 2048)  Intake/Output from previous day: 03/03 0701 - 03/04 0700 In: 4335.5 [P.O.:440; I.V.:3095.5; IV Piggyback:800] Out: 1480 [Urine:1230; Blood:250] Intake/Output this shift: No intake/output data recorded.  Physical Exam: Patient is awake, A/O X 4, conversant, and in good spirits. Speech is fluent and appropriate. Doing well. MAEW with good strength that is symmetric bilaterally. Dressings are clean dry intact. Incisions are well approximated with no drainage, erythema, or edema. TLSO brace in place   Lab Results: Recent Labs    08/12/20 0911 08/12/20 1426  HGB 10.5* 10.2*  HCT 31.0* 30.0*   BMET Recent Labs    08/12/20 0911 08/12/20 1426  NA 139 138  K 3.7 4.0    Studies/Results: DG Lumbar Spine 2-3 Views  Result Date: 08/12/2020 CLINICAL DATA:  Lumbar surgery. EXAM: LUMBAR SPINE - 2-3 VIEW; DG C-ARM 1-60 MIN COMPARISON:  Prior study same day.  MRI 03/04/2020. FINDINGS: Lumbar spine numbered as per prior MRI. Postsurgical changes noted of the lumbosacral spine with L2 through S1 posterior and interbody fusion. L5-S1 anterior fusion. 3 minutes 47 seconds fluoroscopy time. 69.46 mGy. IMPRESSION: Postsurgical changes lumbosacral spine. Electronically Signed   By: Marcello Moores  Register   On: 08/12/2020 16:31   DG Lumbar Spine 2-3 Views  Result Date: 08/12/2020 CLINICAL DATA:  Lumbar surgery. EXAM: LUMBAR SPINE - 2-3 VIEW COMPARISON:  MRI 03/04/2020. FINDINGS: Postsurgical changes lumbar spine with interbody fusion noted. Lower  lumbar fusion present. Visualized hardware intact. Anatomic alignment. Scoliosis and diffuse degenerative change. No acute bony abnormality. IMPRESSION: Postsurgical changes lumbar spine. Electronically Signed   By: Marcello Moores  Register   On: 08/12/2020 13:01   DG C-Arm 1-60 Min  Result Date: 08/12/2020 CLINICAL DATA:  Lumbar surgery. EXAM: LUMBAR SPINE - 2-3 VIEW; DG C-ARM 1-60 MIN COMPARISON:  Prior study same day.  MRI 03/04/2020. FINDINGS: Lumbar spine numbered as per prior MRI. Postsurgical changes noted of the lumbosacral spine with L2 through S1 posterior and interbody fusion. L5-S1 anterior fusion. 3 minutes 47 seconds fluoroscopy time. 69.46 mGy. IMPRESSION: Postsurgical changes lumbosacral spine. Electronically Signed   By: Marcello Moores  Register   On: 08/12/2020 16:31   DG OR LOCAL ABDOMEN  Addendum Date: 08/12/2020   ADDENDUM REPORT: 08/12/2020 13:23 ADDENDUM: Surgical clip noted over the right abdomen. No retained surgical instruments noted. Electronically Signed   By: Marcello Moores  Register   On: 08/12/2020 13:23   Result Date: 08/12/2020 CLINICAL DATA:  Evaluation for retained instrument. EXAM: OR LOCAL ABDOMEN COMPARISON:  No recent prior. FINDINGS: Right lateral and upper abdomen not completely imaged. Soft tissue structures are unremarkable. No evidence of retained surgical instrument. Prior lumbosacral spine fusion. Pelvic calcification consistent phleboliths. Degenerative changes and scoliosis lumbar spine. Degenerative changes both hips. IMPRESSION: No retained surgical instrument noted. Report phoned to the operating room at the time of the study. Electronically Signed: By: Marcello Moores  Register On: 08/12/2020 10:48    Assessment/Plan: Patient is post-op day 1 s/p L5/S1 ALIF with right L2-L5 XLIF with percutaneous pedicle screws at L2-S1. She is recovering well and reports a  resolution of her preoperative symptoms.  Her only complaint is mild incisional and abdominal discomfort.  She has worked with PT  who is recommending SNF. She is awaiting OT evaluation. Continue TLSO brace when OOB. Continue working on pain control, mobility and ambulating patient. Patient has discussed and planned to do rehab at [facility]. Will plan for discharge once a bed is available.   LOS: 1 day     Marvis Moeller, DNP, NP-C 08/13/2020, 8:28 AM

## 2020-08-13 NOTE — Progress Notes (Addendum)
Progress Note    08/13/2020 8:31 AM 1 Day Post-Op  Subjective:  Carol Perez just removed and she is OOB to [facility]. She states her post-op pain is actually better since sitting up. C/o nausea and no appetite. No vomiting or flatus   Vitals:   08/12/20 2105 08/13/20 0625  BP: (!) 141/76 (!) 113/48  Pulse: 80 80  Resp: 19 16  Temp: 98.5 F (36.9 C) 99 F (37.2 C)  SpO2: 100% 98%    Physical Exam: General appearance: Awake, alert in no apparent distress Cardiac: Heart rate and rhythm are regular Respirations: Nonlabored Incisions: LLQ incision well approximated without bleeding. Honeycomb dressing intact Extremities: Both feet are warm with intact sensation and motor function. 2+ palp DP pulses bilaterally Abd: patient sitting>non distended    CBC    Component Value Date/Time   WBC 6.8 08/09/2020 1530   RBC 3.71 (L) 08/09/2020 1530   HGB 10.2 (L) 08/12/2020 1426   HCT 30.0 (L) 08/12/2020 1426   PLT 232 08/09/2020 1530   MCV 100.5 (H) 08/09/2020 1530   MCH 33.4 08/09/2020 1530   MCHC 33.2 08/09/2020 1530   RDW 12.7 08/09/2020 1530   LYMPHSABS 1,941 11/19/2019 0929   MONOABS 0.7 09/02/2019 1016   EOSABS 149 11/19/2019 0929   BASOSABS 93 11/19/2019 0929    BMET    Component Value Date/Time   NA 138 08/12/2020 1426   K 4.0 08/12/2020 1426   CL 99 08/09/2020 1530   CO2 28 08/09/2020 1530   GLUCOSE 91 08/09/2020 1530   BUN 7 (L) 08/09/2020 1530   CREATININE 0.76 08/09/2020 1530   CREATININE 0.81 11/19/2019 0929   CALCIUM 9.1 08/09/2020 1530   GFRNONAA >60 08/09/2020 1530   GFRNONAA 70 11/19/2019 0929   GFRAA 81 11/19/2019 0929     Intake/Output Summary (Last 24 hours) at 08/13/2020 0831 Last data filed at 08/13/2020 0700 Gross per 24 hour  Intake 4135.46 ml  Output 1480 ml  Net 2655.46 ml    HOSPITAL MEDICATIONS Scheduled Meds: . vitamin C  500 mg Oral Daily  . aspirin EC  81 mg Oral Q breakfast  . B-complex with vitamin C  1 tablet Oral Daily  . calcium  carbonate  1 tablet Oral Q breakfast  . cholecalciferol  2,000 Units Oral TID  . conjugated estrogens  0.5 g Vaginal Once per day on Mon Wed Fri  . docusate sodium  100 mg Oral BID  . gabapentin  100 mg Oral QHS  . methenamine  1,000 mg Oral BID WC  . multivitamin with minerals  1 tablet Oral Daily  . pantoprazole (PROTONIX) IV  40 mg Intravenous QHS  . sodium chloride flush  3 mL Intravenous Q12H  . traZODone  25 mg Oral QHS  . valACYclovir  500 mg Oral Daily   Continuous Infusions: . sodium chloride 250 mL (08/12/20 2311)  . dextrose 5 % and 0.45 % NaCl with KCl 20 mEq/L 75 mL/hr at 08/13/20 0700  . methocarbamol (ROBAXIN) IV     PRN Meds:.acetaminophen **OR** acetaminophen, alum & mag hydroxide-simeth, bisacodyl, HYDROcodone-acetaminophen, HYDROmorphone (DILAUDID) injection, loperamide **AND** simethicone, menthol-cetylpyridinium **OR** phenol, methocarbamol **OR** methocarbamol (ROBAXIN) IV, ondansetron **OR** ondansetron (ZOFRAN) IV, oxyCODONE, polyethylene glycol, polyvinyl alcohol, sodium chloride flush, sodium phosphate, zolpidem  Assessment and Plan: POD 1 Anterior exposure L5-S1. No flatus yet. C/o nausea Rec: clear liquids. Should improve with improved mobility; anti-emetics PRN  Risa Grill, PA-C [facility] [phone] 08/13/2020  8:31 AM  I agree with the above.  I have seen and examined the patient.  I agree with the above plan  Annamarie Major

## 2020-08-14 MED ORDER — PANTOPRAZOLE SODIUM 40 MG PO TBEC
40.0000 mg | DELAYED_RELEASE_TABLET | Freq: Every day | ORAL | Status: DC
  Administered 2020-08-14 – 2020-08-23 (×9): 40 mg via ORAL
  Filled 2020-08-14 (×10): qty 1

## 2020-08-14 NOTE — Progress Notes (Signed)
Physical Therapy Treatment Patient Details Name: Carol Perez MRN: [id number] DOB: Dec 30, 1940 Today's Date: 08/14/2020    History of Present Illness 80 y.o. female presents on 08/12/2020 for L5-S1 ALIF and R L2-5 anterolateral lumbar interbody fusion due to low back pain with LLE radiculopathy. PMH includes ostropenia.    PT Comments    Pt making good progress towards her goals, ambulating an increased distance of up to ~40 ft with a RW and min guard-A. Pt with decreased bil step length and feet clearance and slow gait, placing her at risk for falls. Pt needing extensive assistance and time for all bed mobility and reminders to remain compliant with her spinal precautions. Pt reports feeling "groggy" and out of it and reports she thinks it is her medicine. Will continue to follow acutely. Current recommendations remain appropriate.    Follow Up Recommendations  SNF (per pt discharge plan is for short term rehab at [place])     Equipment Recommendations  Rolling walker with 5" wheels    Recommendations for Other Services       Precautions / Restrictions Precautions Precautions: Fall;Back Precaution Booklet Issued: Yes (comment) Precaution Comments: Reviewed precautions, poor pt recall Required Braces or Orthoses: Spinal Brace Spinal Brace: Lumbar corset;Applied in sitting position Restrictions Weight Bearing Restrictions: No    Mobility  Bed Mobility Overal bed mobility: Needs Assistance Bed Mobility: Rolling;Sidelying to Sit Rolling: Min assist Sidelying to sit: Mod assist       General bed mobility comments: cues for log roll through placing L foot on bed and reaching with L arm across to R rail, minA to complete. ModA to manage trunk and assist with initiation of legs with transition to sit, extra time and effort. Bed flat.    Transfers Overall transfer level: Needs assistance   Transfers: Sit to/from Stand;Stand Pivot Transfers Sit to Stand: Min  assist Stand pivot transfers: Min assist       General transfer comment: MinA to power up to stand, cuing for hand placement. Stand step to L EOB > commode with minA for steadying and sequencing.  Ambulation/Gait Ambulation/Gait assistance: Min assist Gait Distance (Feet): 40 Feet (x2 bouts of ~3 ft > ~40 ft) Assistive device: Rolling walker (2 wheeled) Gait Pattern/deviations: Step-through pattern;Decreased stride length;Trunk flexed Gait velocity: reduced Gait velocity interpretation: <1.31 ft/sec, indicative of household ambulator General Gait Details: Pt with slow, mildly unsteady gait with decreased bil step length and height with trunk slightly flexed. Cues to improve step length, min success. No LOB, minA for safety.   Stairs             Wheelchair Mobility    Modified Rankin (Stroke Patients Only)       Balance Overall balance assessment: Needs assistance Sitting-balance support: No upper extremity supported;Feet supported Sitting balance-Leahy Scale: Fair Sitting balance - Comments: ModA to manage brace with cues for donning brace sitting EOB. Pt reaching min off BOS to do so.   Standing balance support: Bilateral upper extremity supported Standing balance-Leahy Scale: Poor Standing balance comment: reliant on BUE support of RW                            Cognition Arousal/Alertness: Awake/alert Behavior During Therapy: WFL for tasks assessed/performed Overall Cognitive Status: Within Functional Limits for tasks assessed  Exercises      General Comments General comments (skin integrity, edema, etc.): SpO2 >/= 90%, re-donned 2L/min O2 via [place] end of session with it increasing to mid-high 90s      Pertinent Vitals/Pain Pain Assessment: Faces Faces Pain Scale: Hurts whole lot Pain Location: low back Pain Descriptors / Indicators: Grimacing;Guarding;Operative site guarding Pain  Intervention(s): Limited activity within patient's tolerance;Monitored during session;Repositioned    Home Living                      Prior Function            PT Goals (current goals can now be found in the care plan section) Acute Rehab PT Goals Patient Stated Goal: to return to independent mobility PT Goal Formulation: With patient Time For Goal Achievement: 08/27/20 Potential to Achieve Goals: Good Progress towards PT goals: Progressing toward goals    Frequency    Min 5X/week      PT Plan Current plan remains appropriate    Co-evaluation              AM-PAC PT "6 Clicks" Mobility   Outcome Measure  Help needed turning from your back to your side while in a flat bed without using bedrails?: A Little Help needed moving from lying on your back to sitting on the side of a flat bed without using bedrails?: A Lot Help needed moving to and from a bed to a chair (including a wheelchair)?: A Little Help needed standing up from a chair using your arms (e.g., wheelchair or bedside chair)?: A Little Help needed to walk in hospital room?: A Little Help needed climbing 3-5 steps with a railing? : Total 6 Click Score: 15    End of Session Equipment Utilized During Treatment: Back brace;Oxygen Activity Tolerance: Patient limited by fatigue Patient left: in chair;with call bell/phone within reach;with chair alarm set   PT Visit Diagnosis: Other abnormalities of gait and mobility (R26.89);Muscle weakness (generalized) (M62.81);Pain;Unsteadiness on feet (R26.81);Difficulty in walking, not elsewhere classified (R26.2) Pain - part of body:  (back)     Time: 2353-6144 PT Time Calculation (min) (ACUTE ONLY): 30 min  Charges:  $Gait Training: 8-22 mins $Therapeutic Activity: 8-22 mins                     Moishe Spice, PT, DPT Acute Rehabilitation Services  Pager: [phone] Office: [place] 08/14/2020, 11:18 AM

## 2020-08-14 NOTE — Progress Notes (Signed)
Overall stable.  Patient still with some complaints of abdominal distention and does report some mild flatulence.  No nausea vomiting.  Preoperative back and radicular symptoms improved.  Mobilizing slowly.  She is afebrile.  Vital signs are stable.  Urine output good.  She is awake and alert.  She appears uncomfortable.  Motor and sensory function intact.  Abdomen mildly distended but none tender otherwise.  Overall progressing reasonably well following anterior posterior lumbar surgery.  Continue efforts at mobilization.  Likely discharge home early in the week.

## 2020-08-14 NOTE — Progress Notes (Signed)
   ASSESSMENT & PLAN:  Carol Perez is a 80 y.o. female status post L5-S1 ALIF 3/3   PRN pain control Ileus. OK for diet. Go slow. Continue bowel regimen. NPO if N/V. PT / OT / OOB / Ambulate  SUBJECTIVE:  Reports bloating. No bowel function. Tolerating PO.  OBJECTIVE:  BP 117/61 (BP Location: Right Arm)   Pulse 85   Temp 97.7 F (36.5 C) (Oral)   Resp 20   Ht 5\' 2"  (1.575 m)   Wt 60.7 kg   SpO2 97%   BMI 24.47 kg/m   Intake/Output Summary (Last 24 hours) at 08/14/2020 1018 Last data filed at 08/14/2020 0530 Gross per 24 hour  Intake 200 ml  Output 750 ml  Net -550 ml    Constitutional: well appearing. no acute distress. Cardiac: RRR. Pulmonary: unlabored Abdominal: abdominal incision clean and dry  CBC Latest Ref Rng & Units 08/12/2020 08/12/2020 08/09/2020  WBC 4.0 - 10.5 K/uL - - 6.8  Hemoglobin 12.0 - 15.0 g/dL 10.2(L) 10.5(L) 12.4  Hematocrit 36.0 - 46.0 % 30.0(L) 31.0(L) 37.3  Platelets 150 - 400 K/uL - - 232     CMP Latest Ref Rng & Units 08/12/2020 08/12/2020 08/09/2020  Glucose 70 - 99 mg/dL - - 91  BUN 8 - 23 mg/dL - - 7(L)  Creatinine 0.44 - 1.00 mg/dL - - 0.76  Sodium 135 - 145 mmol/L 138 139 138  Potassium 3.5 - 5.1 mmol/L 4.0 3.7 4.1  Chloride 98 - 111 mmol/L - - 99  CO2 22 - 32 mmol/L - - 28  Calcium 8.9 - 10.3 mg/dL - - 9.1  Total Protein 6.1 - 8.1 g/dL - - -  Total Bilirubin 0.2 - 1.2 mg/dL - - -  AST 10 - 35 U/L - - -  ALT 6 - 29 U/L - - -    Estimated Creatinine Clearance: 50.5 mL/min (by C-G formula based on SCr of 0.76 mg/dL).  Carol Perez. Carol Breed, MD [facility] Phone Number: [phone] 08/14/2020 10:18 AM

## 2020-08-15 MED ORDER — MAGNESIUM CITRATE PO SOLN
1.0000 | Freq: Once | ORAL | Status: AC
  Administered 2020-08-15: 1 via ORAL
  Filled 2020-08-15: qty 296

## 2020-08-15 NOTE — Progress Notes (Addendum)
Patient bowels continue to be hypoactive and PT continues to complain of bloating feeling in her abdominal region.Nurse and Nurse tech walked patient inside of her room, had her sit in her chair for an hour and PT states she received a suppository earlier this morning. PT also received Miralax and colace today.  1704 PT received an enema after refusing another suppository. She is lying on her left side and reports no pain just discomfort. Awaiting results of enema.   1710 PT is expelling gas however no signs of bm yet.  Shelbie Proctor, RN

## 2020-08-15 NOTE — Progress Notes (Signed)
Patient continues to progress slowly.  She states that she feels weak all over.  Pain appears to be adequately controlled.  Patient with minimal flatus and some abdominal discomfort.  She is afebrile.  Her vital signs are stable.  Urine output is good.  She is awake and alert.  She is oriented and appropriate.  Motor examination intact aside from some mild hip flexor weakness on the right which appears to be more from guarding than true weakness.  Abdomen is mildly distended.  It is minimally tender.  There is no rebound.  There are hypoactive bowel sounds.  Overall progressing well.  Patient with continued gut hypomobility and probable early ileus.  Will tryDulcolax suppository today and continue efforts at mobilization.

## 2020-08-16 ENCOUNTER — Inpatient Hospital Stay (HOSPITAL_COMMUNITY): Payer: Medicare Other

## 2020-08-16 MED ORDER — DM-GUAIFENESIN ER 30-600 MG PO TB12
1.0000 | ORAL_TABLET | Freq: Two times a day (BID) | ORAL | Status: DC
  Administered 2020-08-16 – 2020-08-17 (×3): 1 via ORAL
  Filled 2020-08-16 (×3): qty 1

## 2020-08-16 NOTE — TOC Initial Note (Signed)
Transition of Care [facility]) - Initial/Assessment Note    Patient Details  Name: Carol Perez MRN: [id number] Date of Birth: November 20, 1940  [facility]) CM/SW Contact:    Vinie Sill, [place] Phone Number: 08/16/2020, 4:16 PM  Clinical Narrative:                  CSW visit with patient at bedside. CSW introduced self and explained role. CSW discussed PT recommendation of short term rehab at [facility]. Patient confirmed she is from [facility] and reside their with her spouse, Carol Perez. She states she anticipated she will return there for short term rehab. Patient expressed she believes she is not going to be here much longer but it was up to God.Patient states her niece is her 64 but is alright to speak with her husband. CSW explained the SNF process. Patient states no questions or concerns at this time.   CSW contacted [place] voice message CSW will continue to follow and assist with discharge planning.  Thurmond Butts, MSW, LCSW Clinical Social Worker   Expected Discharge Plan: Skilled Nursing Facility Barriers to Discharge: SNF Pending bed offer,Continued Medical Work up,Insurance Authorization   Patient Goals and CMS Choice        Expected Discharge Plan and Services Expected Discharge Plan: [place] In-house Referral: Clinical Social Work     Living arrangements for the past 2 months: Single Family Home                                      Prior Living Arrangements/Services Living arrangements for the past 2 months: Single Family Home Lives with:: Self                   Activities of Daily Living Home Assistive Devices/Equipment: Eyeglasses,Blood pressure cuff,Cane (specify quad or straight),Walker (specify type) ADL Screening (condition at time of admission) Patient's cognitive ability adequate to safely complete daily activities?: Yes Is the patient deaf or have difficulty hearing?: No Does the patient have  difficulty seeing, even when wearing glasses/contacts?: No Does the patient have difficulty concentrating, remembering, or making decisions?: No Patient able to express need for assistance with ADLs?: Yes Does the patient have difficulty dressing or bathing?: No Independently performs ADLs?: Yes (appropriate for developmental age) Does the patient have difficulty walking or climbing stairs?: No Weakness of Legs: None Weakness of Arms/Hands: None  Permission Sought/Granted Permission sought to share information with : Family Supports Permission granted to share information with : Yes, Verbal Permission Granted              Emotional Assessment              Admission diagnosis:  Lumbar scoliosis [M41.9] Patient Active Problem List   Diagnosis Date Noted  . Lumbar scoliosis 08/12/2020  . Occipital neuralgia of right side 09/08/2019  . Recurrent UTI 06/26/2019  . Osteoporosis 06/26/2019  . IBS (irritable bowel syndrome) 06/26/2019  . Hyperlipidemia 06/26/2019  . Back pain 06/26/2019  . Barrett's esophagus 06/26/2019   PCP:  Virgie Dad, MD Pharmacy:   [facility], [place] [address] Phone: [phone] Fax: [phone]  [id number] [address], [place] [address] Phone: [phone] Fax: [phone]     Social Determinants  of Health (SDOH) Interventions    Readmission Risk Interventions No flowsheet data found.  

## 2020-08-16 NOTE — Progress Notes (Signed)
   Providing Compassionate, Quality Care - Together   Subjective: Patient reports increase in cough overnight. Bedside nurse reports patient has been coughing up yellow-green sputum. She has had loose bowel movements this AM, with incontinence due to cough. The patient is still quite uncomfortable.  Objective: Vital signs in last 24 hours: Temp:  [97.6 F (36.4 C)-98.5 F (36.9 C)] 97.6 F (36.4 C) (03/07 0746) Pulse Rate:  [82-100] 100 (03/07 0746) Resp:  [18-20] 19 (03/07 0746) BP: (133-160)/(70-82) 160/70 (03/07 0746) SpO2:  [95 %-98 %] 98 % (03/07 0325)  Intake/Output from previous day: 03/06 0701 - 03/07 0700 In: 900 [P.O.:900] Out: 550 [Urine:550] Intake/Output this shift: No intake/output data recorded.  Awake and alert Speech clear, fluent MAE Dressings are clean, dr, and intact  Lab Results: No results for input(s): WBC, HGB, HCT, PLT in the last 72 hours. BMET No results for input(s): NA, K, CL, CO2, GLUCOSE, BUN, CREATININE, CALCIUM in the last 72 hours.  Studies/Results: No results found.  Assessment/Plan: Patient is four days s/p L5-S1 ALIF with right L2-L5 XLIF with percutaneous pedicle screws at L2-S1. She is recovering well and reports a resolution of her preoperative symptoms.  She is having some issues with pain control  She has worked with PT and OT who are recommending SNF. Continue TLSO brace when OOB. Patient has discussed and planned to do rehab at [facility]. Will plan for discharge when patient is medically ready.   LOS: 4 days    -CXR ordered; encourage IS -Continue working on pain control, mobility and ambulating patient.   Viona Gilmore, DNP, AGNP-C Nurse Practitioner  [facility] [place] [address], [place], [place] [address] P: [phone]    F: [phone]  08/16/2020, 11:09 AM

## 2020-08-16 NOTE — NC FL2 (Signed)
[place] LEVEL OF CARE SCREENING TOOL     IDENTIFICATION  Patient Name: Carol Perez Birthdate: 1940/11/27 Sex: female Admission Date (Current Location): 08/12/2020  [facility] and [place] Number:  Herbalist and Address:  [facility] [place] [place] [address], [place], [place] [address]      Provider Number: [id number]  Attending Physician Name and Address:  Erline Levine, MD  Relative Name and Phone Number:       Current Level of Care: SNF Recommended Level of Care: [place] Prior Approval Number:    Date Approved/Denied:   PASRR Number: [id number]  Discharge Plan: SNF    Current Diagnoses: Patient Active Problem List   Diagnosis Date Noted  . Lumbar scoliosis 08/12/2020  . Occipital neuralgia of right side 09/08/2019  . Recurrent UTI 06/26/2019  . Osteoporosis 06/26/2019  . IBS (irritable bowel syndrome) 06/26/2019  . Hyperlipidemia 06/26/2019  . Back pain 06/26/2019  . Barrett's esophagus 06/26/2019    Orientation RESPIRATION BLADDER Height & Weight     Self,Time,Situation,Place  O2 Incontinent Weight: 133 lb 12.8 oz (60.7 kg) Height:  5\' 2"  (157.5 cm)  BEHAVIORAL SYMPTOMS/MOOD NEUROLOGICAL BOWEL NUTRITION STATUS      Continent Diet (please see discharge summary)  AMBULATORY STATUS COMMUNICATION OF NEEDS Skin   Limited Assist Verbally Surgical wounds (closed incision abdomen,closed incision Flank-Right, closed incision, Back)                       Personal Care Assistance Level of Assistance  Bathing,Dressing,Feeding Bathing Assistance: Limited assistance Feeding assistance: Independent Dressing Assistance: Limited assistance     Functional Limitations Info  Sight,Hearing,Speech Sight Info: Adequate Hearing Info: Adequate Speech Info: Adequate    SPECIAL CARE FACTORS FREQUENCY  PT (By licensed PT),OT (By licensed OT)     PT Frequency: 5x per week OT Frequency: 5x per week             Contractures Contractures Info: Not present    Additional Factors Info  Code Status,Allergies Code Status Info: FULL Allergies Info: Banana,latex,ciprofloxaxin,gatifloxacin,levofloxacin,moxifloxzcin,nsaids,ofloxacin,floxin,penicllins,methylisothiazolinone           Current Medications (08/16/2020):  This is the current hospital active medication list Current Facility-Administered Medications  Medication Dose Route Frequency Provider Last Rate Last Admin  . 0.9 %  sodium chloride infusion  250 mL Intravenous Continuous Erline Levine, MD 1 mL/hr at 08/12/20 2311 250 mL at 08/12/20 2311  . acetaminophen (TYLENOL) tablet 650 mg  650 mg Oral Q4H PRN Erline Levine, MD       Or  . acetaminophen (TYLENOL) suppository 650 mg  650 mg Rectal Q4H PRN Erline Levine, MD      . alum & mag hydroxide-simeth (MAALOX/MYLANTA) 200-200-20 MG/5ML suspension 30 mL  30 mL Oral Q6H PRN Erline Levine, MD   30 mL at 08/15/20 0956  . ascorbic acid (VITAMIN C) tablet 500 mg  500 mg Oral Daily Erline Levine, MD   500 mg at 08/16/20 0841  . aspirin EC tablet 81 mg  81 mg Oral Q breakfast Erline Levine, MD   81 mg at 08/16/20 0840  . B-complex with vitamin C tablet 1 tablet  1 tablet Oral Daily Erline Levine, MD   1 tablet at 08/15/20 [phone]  . bisacodyl (DULCOLAX) suppository 10 mg  10 mg Rectal Daily PRN Erline Levine, MD   10 mg at 08/14/20 0249  . calcium carbonate (OS-CAL - dosed in mg of elemental calcium)  tablet 500 mg of elemental calcium  1 tablet Oral Q breakfast Erline Levine, MD   500 mg of elemental calcium at 08/16/20 0841  . cholecalciferol (VITAMIN D3) tablet 2,000 Units  2,000 Units Oral TID Erline Levine, MD   2,000 Units at 08/16/20 1238  . conjugated estrogens (PREMARIN) vaginal cream 9.98 Applicatorful  0.5 g Vaginal Once per day on Mon Wed Fri Erline Levine, MD   3.38 Applicatorful at 25/05/39 0854  . dextromethorphan-guaiFENesin (MUCINEX DM) 30-600 MG per 12 hr tablet 1 tablet  1 tablet Oral BID  Viona Gilmore D, NP   1 tablet at 08/16/20 1237  . dextrose 5 % and 0.45 % NaCl with KCl 20 mEq/L infusion   Intravenous Continuous Erline Levine, MD 75 mL/hr at 08/14/20 1346 New Bag at 08/14/20 1346  . docusate sodium (COLACE) capsule 100 mg  100 mg Oral BID Erline Levine, MD   100 mg at 08/15/20 2228  . gabapentin (NEURONTIN) capsule 100 mg  100 mg Oral QHS Erline Levine, MD   100 mg at 08/15/20 2228  . HYDROcodone-acetaminophen (NORCO) 10-325 MG per tablet 2 tablet  2 tablet Oral Q4H PRN Erline Levine, MD   2 tablet at 08/16/20 1019  . HYDROmorphone (DILAUDID) injection 0.5 mg  0.5 mg Intravenous Q2H PRN Erline Levine, MD   0.5 mg at 08/13/20 2145  . loperamide (IMODIUM) capsule 2 mg  2 mg Oral QID PRN Erline Levine, MD       And  . simethicone [facility]) chewable tablet 160 mg  160 mg Oral QID PRN Erline Levine, MD      . menthol-cetylpyridinium (CEPACOL) lozenge 3 mg  1 lozenge Oral PRN Erline Levine, MD       Or  . phenol (CHLORASEPTIC) mouth spray 1 spray  1 spray Mouth/Throat PRN Erline Levine, MD      . methenamine (MANDELAMINE) tablet 1,000 mg  1,000 mg Oral BID WC Erline Levine, MD   1,000 mg at 08/16/20 0841  . methocarbamol (ROBAXIN) tablet 500 mg  500 mg Oral Q6H PRN Erline Levine, MD   500 mg at 08/16/20 7673   Or  . methocarbamol (ROBAXIN) 500 mg in dextrose 5 % 50 mL IVPB  500 mg Intravenous Q6H PRN Erline Levine, MD      . multivitamin with minerals tablet 1 tablet  1 tablet Oral Daily Erline Levine, MD   1 tablet at 08/16/20 0840  . ondansetron (ZOFRAN) tablet 4 mg  4 mg Oral Q6H PRN Erline Levine, MD       Or  . ondansetron [facility]) injection 4 mg  4 mg Intravenous Q6H PRN Erline Levine, MD   4 mg at 08/15/20 0035  . oxyCODONE (Oxy IR/ROXICODONE) immediate release tablet 5 mg  5 mg Oral Q3H PRN Erline Levine, MD   5 mg at 08/15/20 1639  . pantoprazole (PROTONIX) EC tablet 40 mg  40 mg Oral QHS von Dohlen, Haley B, RPH   40 mg at 08/15/20 2228  . polyethylene glycol  (MIRALAX / GLYCOLAX) packet 17 g  17 g Oral Daily PRN Erline Levine, MD   17 g at 08/15/20 0858  . polyvinyl alcohol (LIQUIFILM TEARS) 1.4 % ophthalmic solution 1 drop  1 drop Both Eyes PRN Erline Levine, MD      . sodium chloride flush (NS) 0.9 % injection 3 mL  3 mL Intravenous Q12H Erline Levine, MD   3 mL at 08/15/20 2200  . sodium chloride flush (NS) 0.9 %  injection 3 mL  3 mL Intravenous PRN Erline Levine, MD      . tamsulosin [facility]) capsule 0.4 mg  0.4 mg Oral Daily Erline Levine, MD   0.4 mg at 08/16/20 0840  . traZODone (DESYREL) tablet 25 mg  25 mg Oral QHS Erline Levine, MD   25 mg at 08/15/20 2229  . valACYclovir (VALTREX) tablet 500 mg  500 mg Oral Daily Erline Levine, MD   500 mg at 08/16/20 0841  . zolpidem (AMBIEN) tablet 5 mg  5 mg Oral QHS PRN Erline Levine, MD         Discharge Medications: Please see discharge summary for a list of discharge medications.  Relevant Imaging Results:  Relevant Lab Results:   Additional Information SSN [id number]   Moderna COVID-19 Vaccine 07/14/2019 , 06/16/2019  Vinie Sill, LCSWA

## 2020-08-16 NOTE — Care Management Important Message (Signed)
Important Message  Patient Details  Name: Carol Perez MRN: [id number] Date of Birth: 05-May-1941   Medicare Important Message Given:  Yes     Orbie Pyo 08/16/2020, 3:37 PM

## 2020-08-16 NOTE — Progress Notes (Addendum)
Pt wheezing loudly. She coughed up a small amount of yellow-green sputum. She said "I have had pneumonia twice in my life." When asked if this is what it felt like she said yes. Pt educated to use IS, but is drowsy and needs repeated encouragement to complete task.   Pt said that she has not had much of an appetite and has not eaten in 5 days. Pt said that she would try to eat some applesauce, but only ate a few spoonfuls.  0930: Message left with [facility] about pt's wheezing and coughing up sputum. Receptionist said that Dr. Vertell Limber was out of the office and that she would let the on-call doctor know. This RN requested a chest x-ray and possibly breathing treatments.   1130: Orders for chest xray and Mucinex DM from Viona Gilmore, NP.   Justice Rocher, RN

## 2020-08-16 NOTE — Progress Notes (Signed)
Physical Therapy Treatment Patient Details Name: Carol Perez MRN: [id number] DOB: June 23, 1940 Today's Date: 08/16/2020    History of Present Illness 80 y.o. female presents on 08/12/2020 for L5-S1 ALIF and R L2-5 anterolateral lumbar interbody fusion due to low back pain with LLE radiculopathy. PMH includes ostropenia.    PT Comments    Pt with seemingly increased confusion today; repeatedly stating she doesn't remember how to get out of bed and asking how to put the bed rails down. Reporting mild dizziness upon transition to sitting and pallor noted; BP 139/59, post mobility BP 123/71. Pt requiring min assist for bed mobility/transfers and ambulating x 25 feet with a walker at a min guard assist level. Attempted to position in chair but pt too painful and requested return to bed. RN aware. Continue to recommend SNF for ongoing Physical Therapy.       Follow Up Recommendations  SNF (per pt discharge plan is for short term rehab at [facility])     Equipment Recommendations  Rolling walker with 5" wheels    Recommendations for Other Services       Precautions / Restrictions Precautions Precautions: Fall;Back Precaution Booklet Issued: Yes (comment) Precaution Comments: Reviewed precautions, poor pt recall Required Braces or Orthoses: Spinal Brace Spinal Brace: Lumbar corset;Applied in sitting position Restrictions Weight Bearing Restrictions: No    Mobility  Bed Mobility Overal bed mobility: Needs Assistance Bed Mobility: Rolling;Sidelying to Sit;Sit to Sidelying Rolling: Min guard Sidelying to sit: Min assist     Sit to sidelying: Min assist General bed mobility comments: Multimodal cues to reach L arm across to roll onto side, assist for trunk up to sitting position. Assist for legs back into bed    Transfers Overall transfer level: Needs assistance   Transfers: Stand Pivot Transfers Sit to Stand: Min assist Stand pivot transfers: Min guard       General  transfer comment: MinA to power up to stand, cues for hand placement. Increased time/effort  Ambulation/Gait Ambulation/Gait assistance: Min guard Gait Distance (Feet): 25 Feet Assistive device: Rolling walker (2 wheeled) Gait Pattern/deviations: Step-through pattern;Decreased stride length;Trunk flexed Gait velocity: reduced   General Gait Details: Min guard for safety, slow pace   Stairs             Wheelchair Mobility    Modified Rankin (Stroke Patients Only)       Balance Overall balance assessment: Needs assistance Sitting-balance support: No upper extremity supported;Feet supported Sitting balance-Leahy Scale: Fair     Standing balance support: Bilateral upper extremity supported Standing balance-Leahy Scale: Poor Standing balance comment: reliant on BUE support of RW                            Cognition Arousal/Alertness: Awake/alert Behavior During Therapy: WFL for tasks assessed/performed Overall Cognitive Status: Impaired/Different from baseline Area of Impairment: Memory;Problem solving;Safety/judgement                     Memory: Decreased short-term memory   Safety/Judgement: Decreased awareness of safety   Problem Solving: Requires verbal cues General Comments: Pt asking upon entry how to take the bed rails down to get out of bed; educated pt she cannot get out of bed by herself without staff present. Pt repeating same questions at time throughout session. Pt at one point saying, "I think I had a stroke," but could not further describe symptoms. RN present to perform neuro assessment; no  deficits noted.      Exercises      General Comments        Pertinent Vitals/Pain Pain Assessment: Faces Faces Pain Scale: Hurts whole lot Pain Location: low back Pain Descriptors / Indicators: Grimacing;Guarding;Operative site guarding Pain Intervention(s): Limited activity within patient's tolerance;Monitored during session    Home  Living                      Prior Function            PT Goals (current goals can now be found in the care plan section) Acute Rehab PT Goals Patient Stated Goal: to return to independent mobility PT Goal Formulation: With patient Time For Goal Achievement: 08/27/20 Potential to Achieve Goals: Good Progress towards PT goals: Progressing toward goals    Frequency    Min 5X/week      PT Plan Current plan remains appropriate    Co-evaluation              AM-PAC PT "6 Clicks" Mobility   Outcome Measure  Help needed turning from your back to your side while in a flat bed without using bedrails?: A Little Help needed moving from lying on your back to sitting on the side of a flat bed without using bedrails?: A Little Help needed moving to and from a bed to a chair (including a wheelchair)?: A Little Help needed standing up from a chair using your arms (e.g., wheelchair or bedside chair)?: A Little Help needed to walk in hospital room?: A Little Help needed climbing 3-5 steps with a railing? : A Lot 6 Click Score: 17    End of Session Equipment Utilized During Treatment: Back brace Activity Tolerance: Patient limited by fatigue Patient left: with call bell/phone within reach;in bed;with bed alarm set Nurse Communication: Mobility status PT Visit Diagnosis: Other abnormalities of gait and mobility (R26.89);Muscle weakness (generalized) (M62.81);Pain;Unsteadiness on feet (R26.81);Difficulty in walking, not elsewhere classified (R26.2) Pain - part of body:  (back)     Time: 7262-0355 PT Time Calculation (min) (ACUTE ONLY): 44 min  Charges:  $Therapeutic Activity: 38-52 mins                     Wyona Almas, PT, DPT Acute Rehabilitation Services Pager [phone] Office [place] 08/16/2020, 4:46 PM

## 2020-08-16 NOTE — Progress Notes (Signed)
  Progress Note    08/16/2020 7:42 AM 4 Days Post-Op  Subjective: This morning, she is incontinent of large, brown loose stool.  She states she significant nausea last night but is improved this morning.  Deep loose sounding cough this morning   Vitals:   08/16/20 0120 08/16/20 0325  BP: 133/75   Pulse: 82 82  Resp: 18 20  Temp: 97.8 F (36.6 C) 98.5 F (36.9 C)  SpO2: 98% 98%    Physical Exam: General appearance: Awake, alert in no apparent distress Cardiac: Heart rate and rhythm are regular Respirations: Nonlabored; expiratory rales Incisions: Left lower quadrant incision is well approximated without bleeding or hematoma  abdomen: Nondistended.  Active bowel sounds    CBC    Component Value Date/Time   WBC 6.8 08/09/2020 1530   RBC 3.71 (L) 08/09/2020 1530   HGB 10.2 (L) 08/12/2020 1426   HCT 30.0 (L) 08/12/2020 1426   PLT 232 08/09/2020 1530   MCV 100.5 (H) 08/09/2020 1530   MCH 33.4 08/09/2020 1530   MCHC 33.2 08/09/2020 1530   RDW 12.7 08/09/2020 1530   LYMPHSABS 1,941 11/19/2019 0929   MONOABS 0.7 09/02/2019 1016   EOSABS 149 11/19/2019 0929   BASOSABS 93 11/19/2019 0929    BMET    Component Value Date/Time   NA 138 08/12/2020 1426   K 4.0 08/12/2020 1426   CL 99 08/09/2020 1530   CO2 28 08/09/2020 1530   GLUCOSE 91 08/09/2020 1530   BUN 7 (L) 08/09/2020 1530   CREATININE 0.76 08/09/2020 1530   CREATININE 0.81 11/19/2019 0929   CALCIUM 9.1 08/09/2020 1530   GFRNONAA >60 08/09/2020 1530   GFRNONAA 70 11/19/2019 0929   GFRAA 81 11/19/2019 0929     Intake/Output Summary (Last 24 hours) at 08/16/2020 0742 Last data filed at 08/16/2020 0600 Gross per 24 hour  Intake 900 ml  Output 550 ml  Net 350 ml    HOSPITAL MEDICATIONS Scheduled Meds: . vitamin C  500 mg Oral Daily  . aspirin EC  81 mg Oral Q breakfast  . B-complex with vitamin C  1 tablet Oral Daily  . calcium carbonate  1 tablet Oral Q breakfast  . cholecalciferol  2,000 Units Oral TID   . conjugated estrogens  0.5 g Vaginal Once per day on Mon Wed Fri  . docusate sodium  100 mg Oral BID  . gabapentin  100 mg Oral QHS  . methenamine  1,000 mg Oral BID WC  . multivitamin with minerals  1 tablet Oral Daily  . pantoprazole  40 mg Oral QHS  . sodium chloride flush  3 mL Intravenous Q12H  . tamsulosin  0.4 mg Oral Daily  . traZODone  25 mg Oral QHS  . valACYclovir  500 mg Oral Daily   Continuous Infusions: . sodium chloride 250 mL (08/12/20 2311)  . dextrose 5 % and 0.45 % NaCl with KCl 20 mEq/L 75 mL/hr at 08/14/20 1346  . methocarbamol (ROBAXIN) IV     PRN Meds:.acetaminophen **OR** acetaminophen, alum & mag hydroxide-simeth, bisacodyl, HYDROcodone-acetaminophen, HYDROmorphone (DILAUDID) injection, loperamide **AND** simethicone, menthol-cetylpyridinium **OR** phenol, methocarbamol **OR** methocarbamol (ROBAXIN) IV, ondansetron **OR** ondansetron (ZOFRAN) IV, oxyCODONE, polyethylene glycol, polyvinyl alcohol, sodium chloride flush, zolpidem  Assessment and Plan: POD 4 Anterior exposure L5-S1. Loose BM this am. Continue monitoring PO intake.   Risa Grill, PA-C [facility] [phone] 08/16/2020  7:42 AM

## 2020-08-17 DIAGNOSIS — E871 Hypo-osmolality and hyponatremia: Secondary | ICD-10-CM | POA: Diagnosis not present

## 2020-08-17 LAB — CBC
HCT: 22.7 % — ABNORMAL LOW (ref 36.0–46.0)
Hemoglobin: 7.8 g/dL — ABNORMAL LOW (ref 12.0–15.0)
MCH: 33.1 pg (ref 26.0–34.0)
MCHC: 34.4 g/dL (ref 30.0–36.0)
MCV: 96.2 fL (ref 80.0–100.0)
Platelets: 264 10*3/uL (ref 150–400)
RBC: 2.36 MIL/uL — ABNORMAL LOW (ref 3.87–5.11)
RDW: 11.9 % (ref 11.5–15.5)
WBC: 11.1 10*3/uL — ABNORMAL HIGH (ref 4.0–10.5)
nRBC: 0.2 % (ref 0.0–0.2)

## 2020-08-17 LAB — BASIC METABOLIC PANEL
Anion gap: 7 (ref 5–15)
Anion gap: 7 (ref 5–15)
BUN: 8 mg/dL (ref 8–23)
BUN: 8 mg/dL (ref 8–23)
CO2: 26 mmol/L (ref 22–32)
CO2: 25 mmol/L (ref 22–32)
Calcium: 7.6 mg/dL — ABNORMAL LOW (ref 8.9–10.3)
Calcium: 7.5 mg/dL — ABNORMAL LOW (ref 8.9–10.3)
Chloride: 84 mmol/L — ABNORMAL LOW (ref 98–111)
Chloride: 83 mmol/L — ABNORMAL LOW (ref 98–111)
Creatinine, Ser: 0.48 mg/dL (ref 0.44–1.00)
Creatinine, Ser: 0.46 mg/dL (ref 0.44–1.00)
GFR, Estimated: >60 mL/min (ref >60)
GFR, Estimated: >60 mL/min (ref >60)
Glucose, Bld: 139 mg/dL — ABNORMAL HIGH (ref 70–99)
Glucose, Bld: 103 mg/dL — ABNORMAL HIGH (ref 70–99)
Potassium: 4.7 mmol/L (ref 3.5–5.1)
Potassium: 4.7 mmol/L (ref 3.5–5.1)
Sodium: 117 mmol/L — CL (ref 135–145)
Sodium: 115 mmol/L — CL (ref 135–145)

## 2020-08-17 LAB — URIC ACID: Uric Acid, Serum: 2.2 mg/dL — ABNORMAL LOW (ref 2.5–7.1)

## 2020-08-17 LAB — SODIUM, URINE, RANDOM: Sodium, Ur: 79 mmol/L

## 2020-08-17 LAB — OSMOLALITY, URINE: Osmolality, Ur: 427 mOsm/kg (ref 300–900)

## 2020-08-17 LAB — SODIUM
Sodium: 114 mmol/L — CL (ref 135–145)
Sodium: 115 mmol/L — CL (ref 135–145)

## 2020-08-17 LAB — IRON AND TIBC
Iron: 37 ug/dL (ref 28–170)
Saturation Ratios: 17 % (ref 10.4–31.8)
TIBC: 221 ug/dL — ABNORMAL LOW (ref 250–450)
UIBC: 184 ug/dL

## 2020-08-17 LAB — OSMOLALITY: Osmolality: 243 mOsm/kg — CL (ref 275–295)

## 2020-08-17 LAB — FERRITIN: Ferritin: 219 ng/mL (ref 11–307)

## 2020-08-17 LAB — GLUCOSE, CAPILLARY: Glucose-Capillary: 145 mg/dL — ABNORMAL HIGH (ref 70–99)

## 2020-08-17 LAB — TSH: TSH: 1.837 u[IU]/mL (ref 0.350–4.500)

## 2020-08-17 MED ORDER — SODIUM CHLORIDE 0.9 % IV SOLN: INTRAVENOUS | Status: DC

## 2020-08-17 MED ORDER — SODIUM CHLORIDE 3 % IV SOLN
INTRAVENOUS | Status: DC
  Filled 2020-08-17 (×2): qty 500

## 2020-08-17 MED FILL — Heparin Sodium (Porcine) Inj 1000 Unit/ML: INTRAMUSCULAR | Qty: 30 | Status: AC

## 2020-08-17 MED FILL — Sodium Chloride IV Soln 0.9%: INTRAVENOUS | Qty: 1000 | Status: AC

## 2020-08-17 NOTE — Progress Notes (Signed)
   Providing Compassionate, Quality Care - Together   Subjective: Nurse reports the patient had had an increase in drowsiness over the last 24 hours. Cough has improved, but x-ray with concern for developing pneumonia.  Objective: Vital signs in last 24 hours: Temp:  [97.5 F (36.4 C)-99.2 F (37.3 C)] 98 F (36.7 C) (03/08 0719) Pulse Rate:  [79-89] 85 (03/08 0719) Resp:  [13-18] 18 (03/08 0719) BP: (126-160)/(63-71) 142/63 (03/08 0719) SpO2:  [93 %-100 %] 97 % (03/08 0719)  Intake/Output from previous day: 03/07 0701 - 03/08 0700 In: 892.5 [I.V.:892.5] Out: -  Intake/Output this shift: No intake/output data recorded.  Somnolent, requires repeated stimulation to interact PERRLA CN II-XII grossly intact MAE, Strength and sensation intact Incision is covered with Honeycomb dressing and Steri Strips; Dressing is clean, dry, and intact   Lab Results: No results for input(s): WBC, HGB, HCT, PLT in the last 72 hours. BMET No results for input(s): NA, K, CL, CO2, GLUCOSE, BUN, CREATININE, CALCIUM in the last 72 hours.  Studies/Results: DG CHEST PORT 1 VIEW  Result Date: 08/16/2020 CLINICAL DATA:  Short of breath, productive cough EXAM: PORTABLE CHEST 1 VIEW COMPARISON:  10/13/2004 FINDINGS: Hypoventilation with decreased lung volume. Left lower lobe consolidation, moderate. Mild right lower lobe airspace disease. No edema or effusion. IMPRESSION: Bibasilar airspace disease left greater than right. Possible atelectasis versus pneumonia. Electronically Signed   By: Franchot Gallo M.D.   On: 08/16/2020 11:52    Assessment/Plan: Patient is five days s/p L5-S1 ALIF with right L2-L5 XLIF with percutaneous pedicle screws at L2-S1. She has been recovering well and reports a resolution of her preoperative symptoms. She has been having some issues with pain control She has worked with PT and OT who are recommending SNF. Continue TLSO brace when OOB. Patient has discussed and planned to  do rehab at [facility]. She has developed increased somnolence since Mucinex was started yesterday.   LOS: 5 days    -Continue to encourage IS and mobilization -Mucinex has been discontinued -CBC and BMP ordered   Viona Gilmore, DNP, AGNP-C Nurse Practitioner  [facility] [place]. [address], [place], [place],  [address] P: [phone]    F: [phone]  08/17/2020, 10:59 AM

## 2020-08-17 NOTE — Progress Notes (Signed)
Date and time results received: 08/17/20 2000  Test: Sodium Critical Value: 114  Name of Provider Notified: Regenia Skeeter, no new orders, monitor levels when retaken.  Neurosurgery paged @ 2015, no new orders, told to page triad hospitalists and Nephrology for new orders overnight  Nephrology notified at bedside at 2010, verbal order to recheck sodium at 2100.

## 2020-08-17 NOTE — Progress Notes (Signed)
   Providing Compassionate, Quality Care - Together   Received call that patient's sodium was 117 on most recent BMP. Had nurse discontinue the D5 with potassium fluids and start NS at 75 mL/hr. Hgb 7.8, down from 08/12/2020 when it was 10.2. Questions whether patient may be hemodiluted. She has had no bleeding from her surgical wounds. Consulted the Hospitalists for evaluation and further recommendations.  Viona Gilmore, DNP, AGNP-C Nurse Practitioner  [facility] [place] [address], [place], [place] [address] P: [phone]    F: [phone]    08/17/2020 2:53 PM

## 2020-08-17 NOTE — Significant Event (Addendum)
Rapid Response Event Note   Reason for Call :  Somnolence   Pt s/p back surgery(3/3). Pt lethargic this AM. Na found to be 115. Pt was started on 3% saline at 1912.   At shift change, pt was awake, conversant, able to answer questions appropriately, follow commands, and move all extremities. At that time, pt was c/o 8/10 back pain and was given 2 tab norco(2000). Pt now somnolent.   Initial Focused Assessment:  Pt lying in bed with eyes closed. She will arouse to verbal commands, is alert to self, follows commands, and moves all extremities. Pupils 3, equal and reactive. Pt denies pain. She is very somnolent and falls back to sleep when not stimulated.   T-99.1, HR-82, 150/65, RR-8-10, SpO2-99%   Interventions:  No RRT interventions at this time.  Plan of Care:  Pt is somnolent after receiving Norco. Her RR is 8-10 when asleep and 14-16 when awake. Her other VS are stable. She is protecting her airway and does arouse easily to verbal command. No need for narcan at this time. Continue to monitor pt closely.???Whether it would be beneficial to change Norco order from 2 tab to 1-2 tab since pt is so somnolent after receiving 2 tab. ???whether increasing somnolence is related to her Na level(pt has been receiving 2 tab Norco without somnolent effects). Na level is due now. When result is back, RN to reassess pt and call MD with Na result and notify them of pt's current mental status. Please call RRT if further assistance needed.   Event Summary:   MD Notified: RN to notify when Na level is back or sooner if pt  Call Time:2253 Arrival Time:2257 End GLOV:[id number]  Dillard Essex, RN

## 2020-08-17 NOTE — Progress Notes (Addendum)
Date and time results received: 08/17/20 1640  Test: Na Critical Value: 115  Name of Provider Notified: Viona Gilmore, NP  Orders Received? Or Actions Taken?: Viona Gilmore, NP, consulted triad hospitalist for pt's sodium level. Reinaldo Meeker was made aware of new result of 115 and told this RN to reach out to Wynetta Fines, MD. No other orders.  Justice Rocher, RN

## 2020-08-17 NOTE — Progress Notes (Signed)
Date and time results received: 08/17/20 1410  Test: Na Critical Value: 117  Name of Provider Notified: Viona Gilmore, NP  Orders Received? Or Actions Taken?: Verbal orders to discontinue D5 1/2 NS w/ 20K and begin Normal Saline infusion at 75 mL/hr.   Justice Rocher, RN

## 2020-08-17 NOTE — Progress Notes (Signed)
Pt became increasingly irritable and confused throughout the day. Pt found to be hyponatremic at 117 at 1415. Neurosurgery was notified, consulted triad, and placed new orders.   Wynetta Fines, MD presented to bedside and gave verbal orders for no H20 intake, but that the pt can have Gatorade. Pt given Gatorade and IV pain medication. Roosevelt Locks, MD placed orders for 3% hypertonic saline later after consulting nephro. Charge RN, Ewell Gora, spoke to Roosevelt Locks, MD and explained that this unit does not typically do hypertonic solutions. Charge RN called Cedric Fishman, AD, who approved the administration. This RN also spoke to ICU peers, one of which called the [facility], Ron and verified that the solution could be given outside of the ICU. After confirming, medication was hung at 1900 and report was given to Gregary Signs, Location manager. This RN spoke to lab technician to verify Na to be drawn 4 hours after administration, per order, at 2300. Order for Na to not increase over 6 meq/L over 4 hours. Pt was also placed on cardiac telemetry prior to infusion start.  Justice Rocher, RN

## 2020-08-17 NOTE — Consult Note (Addendum)
Nephrology Consult   Assessment/Recommendations:  Severe hyponatremia -Differential dx includes hyponatremia from hypotonic fluids, SIADH secondary to pain. Unsure if this is acute or chronic at this junction, last labs were on 3/3. -given that Carol Perez is exhibiting AMS, would recommend starting 3%NS at 30cc/hr to start with (may need rate to be adjusted but that will dependent on serial labs). Caution with overcorrection, would not overcorrect Na more than 6-8 mEq in the next 24 hours -liberalize solute intake, fluids restrict to 1.5L/day -recommend checking Na Q4H moving forward, repeat Na 2 hours after starting 3% -urine na, urine osm, tsh, serum osm, uric acid pending -pain control per primary service  Encephalopathy -hyponatremia can be a cause of this, see above for plan. If mentation worsens or is unchanged then would recommend CT head stat  Anemia: -Transfuse for Hgb<7 g/dL. Hemodiluted? Mgmt per primary service  Shortness of breath, productive cough -possible PNA on CXR vs atelectasis. Afebrile. Possible Abx per medicine service  Recommendations conveyed to primary service.  Discussed with husband Carol Perez over the phone.   [facility] 08/17/2020 5:54 PM   _____________________________________________________________________________________   History of Present Illness: Carol Perez is a/an 80 y.o. female with a past medical history of recurrent UTIs on ppx, lymphocytic colitis, DDD who presents to [facility] for lumbar surgery for her lumbago sciataca/scoliosis/radiculopathy/degenerative lumbar stenosis. Carol Perez had her L5/S1 ALIF with right L2-L5 XLIF with percutaneous pedicle screws at L2-S1 on 08/12/20. Carol Perez was started on d5 1/2NS w/ KCl 9meq at 75cc/hr on 3/3 and has been on that until today. Last sodium was checked on 3/3 and was 138. Today her labs were checked and her Na was found to be 117. Hypotonic solution was discontinued and Carol Perez was very briefly on  NS and repeat labs shortly after starting revealed a Cr of 115. In the last 24 hours, Carol Perez has had increasing drowsiness and has been somnolent with some AMS which is new. Carol Perez also has been having a lot of pain per documentation and has been needing pain medications.  Currently, Carol Perez is endorsing pain at her surgical site/back. Carol Perez does report brain fog and slow to respond but is AAOX3 upon questioning. Carol Perez does endorse SOB which is unchanged and not worsening (has been on-going). Carol Perez otherwise denies any fevers, chest pain, orthopnea, dizziness, changes in vision, headaches, n/v.   Medications:  Current Facility-Administered Medications  Medication Dose Route Frequency Provider Last Rate Last Admin  . 0.9 %  sodium chloride infusion  250 mL Intravenous Continuous Erline Levine, MD 1 mL/hr at 08/12/20 2311 250 mL at 08/12/20 2311  . acetaminophen (TYLENOL) tablet 650 mg  650 mg Oral Q4H PRN Erline Levine, MD       Or  . acetaminophen (TYLENOL) suppository 650 mg  650 mg Rectal Q4H PRN Erline Levine, MD      . alum & mag hydroxide-simeth (MAALOX/MYLANTA) 200-200-20 MG/5ML suspension 30 mL  30 mL Oral Q6H PRN Erline Levine, MD   30 mL at 08/15/20 0956  . ascorbic acid (VITAMIN C) tablet 500 mg  500 mg Oral Daily Erline Levine, MD   500 mg at 08/17/20 0949  . aspirin EC tablet 81 mg  81 mg Oral Q breakfast Erline Levine, MD   81 mg at 08/17/20 4854  . B-complex with vitamin C tablet 1 tablet  1 tablet Oral Daily Erline Levine, MD   1 tablet at 08/17/20 [phone]  . bisacodyl (DULCOLAX) suppository 10 mg  10 mg  Rectal Daily PRN Erline Levine, MD   10 mg at 08/14/20 0249  . calcium carbonate (OS-CAL - dosed in mg of elemental calcium) tablet 500 mg of elemental calcium  1 tablet Oral Q breakfast Erline Levine, MD   500 mg of elemental calcium at 08/17/20 0803  . cholecalciferol (VITAMIN D3) tablet 2,000 Units  2,000 Units Oral TID Erline Levine, MD   2,000 Units at 08/17/20 1501  . conjugated estrogens  (PREMARIN) vaginal cream 4.26 Applicatorful  0.5 g Vaginal Once per day on Mon Wed Fri Erline Levine, MD   8.34 Applicatorful at 19/62/22 0854  . docusate sodium (COLACE) capsule 100 mg  100 mg Oral BID Erline Levine, MD   100 mg at 08/15/20 2228  . gabapentin (NEURONTIN) capsule 100 mg  100 mg Oral QHS Erline Levine, MD   100 mg at 08/16/20 2112  . HYDROcodone-acetaminophen (NORCO) 10-325 MG per tablet 2 tablet  2 tablet Oral Q4H PRN Erline Levine, MD   2 tablet at 08/17/20 0803  . HYDROmorphone (DILAUDID) injection 0.5 mg  0.5 mg Intravenous Q2H PRN Erline Levine, MD   0.5 mg at 08/17/20 1713  . loperamide (IMODIUM) capsule 2 mg  2 mg Oral QID PRN Erline Levine, MD       And  . simethicone [facility]) chewable tablet 160 mg  160 mg Oral QID PRN Erline Levine, MD      . menthol-cetylpyridinium (CEPACOL) lozenge 3 mg  1 lozenge Oral PRN Erline Levine, MD       Or  . phenol (CHLORASEPTIC) mouth spray 1 spray  1 spray Mouth/Throat PRN Erline Levine, MD      . methenamine (MANDELAMINE) tablet 1,000 mg  1,000 mg Oral BID WC Erline Levine, MD   1,000 mg at 08/17/20 0804  . methocarbamol (ROBAXIN) tablet 500 mg  500 mg Oral Q6H PRN Erline Levine, MD   500 mg at 08/16/20 9798   Or  . methocarbamol (ROBAXIN) 500 mg in dextrose 5 % 50 mL IVPB  500 mg Intravenous Q6H PRN Erline Levine, MD      . multivitamin with minerals tablet 1 tablet  1 tablet Oral Daily Erline Levine, MD   1 tablet at 08/17/20 0950  . ondansetron (ZOFRAN) tablet 4 mg  4 mg Oral Q6H PRN Erline Levine, MD       Or  . ondansetron [facility]) injection 4 mg  4 mg Intravenous Q6H PRN Erline Levine, MD   4 mg at 08/15/20 0035  . oxyCODONE (Oxy IR/ROXICODONE) immediate release tablet 5 mg  5 mg Oral Q3H PRN Erline Levine, MD   5 mg at 08/15/20 1639  . pantoprazole (PROTONIX) EC tablet 40 mg  40 mg Oral QHS von Verdene Rio B, RPH   40 mg at 08/16/20 2112  . polyethylene glycol (MIRALAX / GLYCOLAX) packet 17 g  17 g Oral Daily PRN Erline Levine,  MD   17 g at 08/15/20 0858  . polyvinyl alcohol (LIQUIFILM TEARS) 1.4 % ophthalmic solution 1 drop  1 drop Both Eyes PRN Erline Levine, MD      . sodium chloride (hypertonic) 3 % solution   Intravenous Continuous Wynetta Fines T, MD      . sodium chloride flush (NS) 0.9 % injection 3 mL  3 mL Intravenous Q12H Erline Levine, MD   3 mL at 08/16/20 2113  . sodium chloride flush (NS) 0.9 % injection 3 mL  3 mL Intravenous PRN Erline Levine, MD      .  tamsulosin (FLOMAX) capsule 0.4 mg  0.4 mg Oral Daily Erline Levine, MD   0.4 mg at 08/17/20 0949  . traZODone (DESYREL) tablet 25 mg  25 mg Oral QHS Erline Levine, MD   25 mg at 08/16/20 2114  . valACYclovir (VALTREX) tablet 500 mg  500 mg Oral Daily Erline Levine, MD   500 mg at 08/17/20 0950  . zolpidem (AMBIEN) tablet 5 mg  5 mg Oral QHS PRN Erline Levine, MD         ALLERGIES Banana, Latex, Ciprofloxacin hcl, Gatifloxacin, Levofloxacin, Moxifloxacin, Norfloxacin, Nsaids, Ofloxacin, Other, Penicillins, Methylisothiazolinone, and Tape  MEDICAL HISTORY Past Medical History:  Diagnosis Date  . Barrett's esophagus   . Colitis   . DDD (degenerative disc disease)   . Erroneous encounter - disregard    error   . Erroneous encounter - disregard   . Erroneous encounter - disregard   . Factor 5 Leiden mutation, heterozygous [facility])    Per [place] Patient Packet.  . Frequent UTI   . H/O echocardiogram 04/01/2019   Per [place] Patient Packet.  . Hiatal hernia    Per [place] Patient Packet.  . High grade dysplasia of Barrett's epithelium    Per [facility] New Patient Packet.  Marland Kitchen History of bladder infections    Per [facility] New Patient Packet.  Marland Kitchen History of bone density study 07/12/2018   Per [place] Patient Packet.  Marland Kitchen History of bone density study 07/12/2018   Up coming appointment 07/17/2019, Per [facility] New Patient Packet  . History of bone density study 07/17/2019  . History of endoscopy 12/17/2015   Needed Every 3 years. Scheduled for January 2021. Per [facility]  New Patient Packet.   Marland Kitchen History of mammogram 04/14/2019   Per [place] Patient Packet.  Marland Kitchen History of MRI 03/01/2019   By Dr.Gulati/ Neurologist. MRI of Brain. Per [facility] New Patient Packet.  Marland Kitchen History of MRI 04/23/2019   By Dr. Maryan Rued at [facility]. Per [facility] New Patient Packet.  Marland Kitchen History of Papanicolaou smear of cervix 10/16/2011   Per [place] Patient Packet  . Hypotension    Per [facility] New Patient Packet.  . IBS (irritable bowel syndrome)    Per [place] New Patient Packet.  . Lactose intolerance   . Osteoarthritis    Per [facility] New Patient Packet.  Marland Kitchen Osteopenia    Per [place] New Patient Packet.  . Osteoporosis   . Premature ventricular complex   . Raynaud's disease    Per [facility] New Patient Packet.  . Scoliosis    Per [place] New Patient Packet.     SOCIAL HISTORY Social History   Socioeconomic History  . Marital status: Married    Spouse name: Not on file  . Number of children: 2  . Years of education: Not on file  . Highest education level: Associate degree: occupational, Hotel manager, or vocational program  Occupational History  . Occupation: Retired   Tobacco Use  . Smoking status: Former Smoker    Start date: 06/12/1977  . Smokeless tobacco: Never Used  . Tobacco comment: 42 years ago. Smoked from age [age]  Vaping Use  . Vaping Use: Never used  Substance and Sexual Activity  . Alcohol use: Not Currently    Alcohol/week: 7.0 standard drinks    Types: 7 Glasses of wine per week    Comment: was a glass of wine daily  . Drug use: No  . Sexual activity: Not on file  Other Topics  Concern  . Not on file  Social History Narrative   Tobacco use, amount per day now: No   Past tobacco use, amount per day: Smoked ages [age]   How many years did you use tobacco: 17-20 years   Alcohol use (drinks per week): 1 glass of Red Wine per day.   Diet: Good   Do you drink/eat things with caffeine: No   Marital status: Married                                  What year were you married?  1978   Do you live in a house, apartment, assisted living, condo, trailer, etc.? Apartment/Independent Living   Is it one or more stories? 1   How many persons live in your home? 2    Do you have pets in your home?( please list) No   Highest Level of Education completed? 2 years of college.    Current or past profession: Retired Futures trader   Do you exercise?  Yes                                Type and how often? 31min workout 5 days week. Walking 3-4 Days a Week.    Do you have a living will? Yes   Do you have a DNR form?  Yes                                 If not, do you want to discuss one?   Do you have signed POA/HPOA forms? Yes                       If so, please bring to you appointment      Do you have difficulty bathing or dressing yourself? No   Do you have difficulty preparing food or eating? No   Do you have difficulty managing your medications? No   Do you have difficulty managing your finances? No   Do you have difficulty affording your medications? No       Per [facility] New Patient Packet. Abstracted by Jasmine/RMA.    Social Determinants of Health   Financial Resource Strain: Not on file  Food Insecurity: Not on file  Transportation Needs: Not on file  Physical Activity: Not on file  Stress: Not on file  Social Connections: Not on file  Intimate Partner Violence: Not on file     FAMILY HISTORY Family History  Problem Relation Age of Onset  . Cancer Mother   . Cancer Father      Review of Systems: 12 systems reviewed Otherwise as per HPI, all other systems reviewed and negative  Physical Exam: Vitals:   08/17/20 1118 08/17/20 1447  BP: (!) 141/68 (!) 141/51  Pulse: 78 87  Resp: 19 16  Temp: 98 F (36.7 C) 97.9 F (36.6 C)  SpO2: 97% 93%   Total I/O In: 300 [P.O.:300] Out: -   Intake/Output Summary (Last 24 hours) at 08/17/2020 1754 Last data filed at 08/17/2020 1452 Gross per 24 hour  Intake 1192.51 ml  Output -  Net 1192.51 ml   General:  ill-appearing HEENT: anicteric sclera, oropharynx clear without lesions CV: regular rate, normal rhythm, no murmurs, no gallops, no rubs Lungs: slightly diminished  air entry bibasilar with no w/r/r/c heard, bilateral chest expansion,  in place Abd: soft, non-tender, non-distended Skin: no visible lesions or rashes Musculoskeletal: no edema Neuro: slow to respond, falls asleep during questioning, responds to vocal stimuli, aaox3, moves all extremities spontaneously  Test Results Reviewed Lab Results  Component Value Date   NA 115 (LL) 08/17/2020   K 4.7 08/17/2020   CL 83 (L) 08/17/2020   CO2 25 08/17/2020   BUN 8 08/17/2020   CREATININE 0.46 08/17/2020   CALCIUM 7.5 (L) 08/17/2020     I have reviewed all relevant outside healthcare records related to the patient's kidney injury.

## 2020-08-17 NOTE — Progress Notes (Signed)
  Progress Note    08/17/2020 7:52 AM 5 Days Post-Op  Subjective:  Frustrated with level of abdominal and back pain. 7 episodes of loose stools charted. No vomiting.   Vitals:   08/17/20 0344 08/17/20 0719  BP: 138/66 (!) 142/63  Pulse: 79 85  Resp:  18  Temp: (!) 97.5 F (36.4 C) 98 F (36.7 C)  SpO2: 100% 97%    Physical Exam: General appearance: Awake, alert in no apparent distress Cardiac: Heart rate and rhythm are regular Respirations: Nonlabored; expiratory wheezes Incisions: Left lower quadrant incision is well approximated without bleeding or hematoma abdomen: Nondistended.  Bowel sounds c/w gurgles and rushes.   CBC    Component Value Date/Time   WBC 6.8 08/09/2020 1530   RBC 3.71 (L) 08/09/2020 1530   HGB 10.2 (L) 08/12/2020 1426   HCT 30.0 (L) 08/12/2020 1426   PLT 232 08/09/2020 1530   MCV 100.5 (H) 08/09/2020 1530   MCH 33.4 08/09/2020 1530   MCHC 33.2 08/09/2020 1530   RDW 12.7 08/09/2020 1530   LYMPHSABS 1,941 11/19/2019 0929   MONOABS 0.7 09/02/2019 1016   EOSABS 149 11/19/2019 0929   BASOSABS 93 11/19/2019 0929    BMET    Component Value Date/Time   NA 138 08/12/2020 1426   K 4.0 08/12/2020 1426   CL 99 08/09/2020 1530   CO2 28 08/09/2020 1530   GLUCOSE 91 08/09/2020 1530   BUN 7 (L) 08/09/2020 1530   CREATININE 0.76 08/09/2020 1530   CREATININE 0.81 11/19/2019 0929   CALCIUM 9.1 08/09/2020 1530   GFRNONAA >60 08/09/2020 1530   GFRNONAA 70 11/19/2019 0929   GFRAA 81 11/19/2019 0929     Intake/Output Summary (Last 24 hours) at 08/17/2020 0752 Last data filed at 08/17/2020 0700 Gross per 24 hour  Intake 892.51 ml  Output --  Net 892.51 ml    HOSPITAL MEDICATIONS Scheduled Meds: . vitamin C  500 mg Oral Daily  . aspirin EC  81 mg Oral Q breakfast  . B-complex with vitamin C  1 tablet Oral Daily  . calcium carbonate  1 tablet Oral Q breakfast  . cholecalciferol  2,000 Units Oral TID  . conjugated estrogens  0.5 g Vaginal Once per  day on Mon Wed Fri  . dextromethorphan-guaiFENesin  1 tablet Oral BID  . docusate sodium  100 mg Oral BID  . gabapentin  100 mg Oral QHS  . methenamine  1,000 mg Oral BID WC  . multivitamin with minerals  1 tablet Oral Daily  . pantoprazole  40 mg Oral QHS  . sodium chloride flush  3 mL Intravenous Q12H  . tamsulosin  0.4 mg Oral Daily  . traZODone  25 mg Oral QHS  . valACYclovir  500 mg Oral Daily   Continuous Infusions: . sodium chloride 250 mL (08/12/20 2311)  . dextrose 5 % and 0.45 % NaCl with KCl 20 mEq/L 75 mL/hr at 08/16/20 2116  . methocarbamol (ROBAXIN) IV     PRN Meds:.acetaminophen **OR** acetaminophen, alum & mag hydroxide-simeth, bisacodyl, HYDROcodone-acetaminophen, HYDROmorphone (DILAUDID) injection, loperamide **AND** simethicone, menthol-cetylpyridinium **OR** phenol, methocarbamol **OR** methocarbamol (ROBAXIN) IV, ondansetron **OR** ondansetron (ZOFRAN) IV, oxyCODONE, polyethylene glycol, polyvinyl alcohol, sodium chloride flush, zolpidem  Assessment and Plan: POD 5Anterior exposure L5-S1. Having multiple loose stools. Continue monitoring PO intake and stools. Agree with IVFs, monitoring electrolytes. Defer management of diarrhea to primary team.   Risa Grill, PA-C [facility] [phone] 08/17/2020  7:52 AM

## 2020-08-17 NOTE — Consult Note (Addendum)
[facility] Medical Consultation  Carol Perez RCV:[id number] DOB: 04-02-1941 DOA: 08/12/2020 PCP: Virgie Dad, MD   Requesting physician: Dr. Vertell Limber Date of consultation: 08/17/2020 Reason for consultation: AMS and hyponatremia  Impression/Recommendations Active Problems:   Lumbar scoliosis    1. Symptomatic hyponatremia, euvolemic, probably multifactorial, suspect pain and postop stress induced SIADH, polydipsia secondary to uncontrolled surgical pain, as well as 0.45% NaCL infusion post-op for poor intake.  Discussed with on-call nephrologist, given the relative onset of mentation changes and concurrent significant drop of sodium level, recommend hypertonic 3% saline infusion, check sodium level every 4 hours.  Transfer patient to progressive care unit.  Expect correct sodium level in 2 days, target sodium correction of the first 24 hours be less than 0.5 mEq/h. Fluid restriction of 1500 ml, from now on, discussed with patient husband and daughter at bedside who agreed. Transfer to PCU for 3% saline infusion. Osm and urine lytes pending. 2. Acute metabolic encephalopathy, secondary to hyponatremia, treatment as above. 3. Acute blood loss anemia.  Probably from surgical loss. No hypotension, no tachycardia, no symptoms or signs of active bleed.  Repeat CBC in a.m. Iron study sent.  I will followup again tomorrow. Please contact me if I can be of assistance in the meanwhile. Thank you for this consultation.  Chief Complaint: Pain  HPI:  80 year old female patient with past medical history of sciatica, lumbar scoliosis lumbar stenosis and radiculopathy, presented with worsening of sciatica symptoms and underwent elective lumbar spine fusion 2-3, 3-4 and 4- 5.  After surgery, patient has had poor oral intake for which patient has been maintained on 0.45% saline for hydration for last 4 days.  Also, surgical pain has been poorly controlled with frequent breakthroughs, and family reported  patient has been drinking excess amount of water to help ease the surgical site pain and associated anxiety.  This morning, patient was found very confused and could not answer questions appropriately.  Her work showed sodium 117>115, Hb 7.8 compared to 10.2 5 day ago.  Review of Systems:  14 point review system done negative except those mentioned in HPI.  Past Medical History:  Diagnosis Date  . Barrett's esophagus   . Colitis   . DDD (degenerative disc disease)   . Erroneous encounter - disregard    error   . Erroneous encounter - disregard   . Erroneous encounter - disregard   . Factor 5 Leiden mutation, heterozygous [facility])    Per [place] Patient Packet.  . Frequent UTI   . H/O echocardiogram 04/01/2019   Per [place] Patient Packet.  . Hiatal hernia    Per [place] Patient Packet.  . High grade dysplasia of Barrett's epithelium    Per [facility] New Patient Packet.  Marland Kitchen History of bladder infections    Per [facility] New Patient Packet.  Marland Kitchen History of bone density study 07/12/2018   Per [place] Patient Packet.  Marland Kitchen History of bone density study 07/12/2018   Up coming appointment 07/17/2019, Per [facility] New Patient Packet  . History of bone density study 07/17/2019  . History of endoscopy 12/17/2015   Needed Every 3 years. Scheduled for January 2021. Per [facility] New Patient Packet.   Marland Kitchen History of mammogram 04/14/2019   Per [place] Patient Packet.  Marland Kitchen History of MRI 03/01/2019   By Dr.Gulati/ Neurologist. MRI of Brain. Per [facility] New Patient Packet.  Marland Kitchen History of MRI 04/23/2019   By Dr. Maryan Rued at [facility]. Per [facility]  New Patient Packet.  Marland Kitchen History of Papanicolaou smear of cervix 10/16/2011   Per [place] Patient Packet  . Hypotension    Per [facility] New Patient Packet.  . IBS (irritable bowel syndrome)    Per [place] New Patient Packet.  . Lactose intolerance   . Osteoarthritis    Per [facility] New Patient Packet.  Marland Kitchen Osteopenia    Per [place] New Patient Packet.  . Osteoporosis   . Premature ventricular  complex   . Raynaud's disease    Per [facility] New Patient Packet.  . Scoliosis    Per [place] New Patient Packet.   Past Surgical History:  Procedure Laterality Date  . ABDOMINAL EXPOSURE N/A 08/12/2020   Procedure: ABDOMINAL EXPOSURE;  Surgeon: Serafina Mitchell, MD;  Location: [place];  Service: Vascular;  Laterality: N/A;  . ABDOMINAL HYSTERECTOMY  06/13/2011   By Dr.Scherer at [facility]. Per [facility] New Patient Packet.  . ANTERIOR LAT LUMBAR FUSION Right 08/12/2020   Procedure: Right Lumbar two-three, Lumbar three-four, Lumbar four-five Anterolateral lumbar interbody fusion;  Surgeon: Erline Levine, MD;  Location: [place];  Service: Neurosurgery;  Laterality: Right;  . ANTERIOR LUMBAR FUSION N/A 08/12/2020   Procedure: Lumbar five Sacral one Anterior lumbar interbody fusion;  Surgeon: Erline Levine, MD;  Location: [place];  Service: Neurosurgery;  Laterality: N/A;  . BREAST EXCISIONAL BIOPSY Left 30+ yrs ago  . COLONOSCOPY  11/23/2011   By Dr.McCune at [facility]. Per [place] Patient Packet.  . COLONOSCOPY  08/26/2019  . LUMBAR PERCUTANEOUS PEDICLE SCREW 4 LEVEL N/A 08/12/2020   Procedure: Percutaneous pedicle screw fixation from Lumbar two to Sacral one ;  Surgeon: Erline Levine, MD;  Location: [place];  Service: Neurosurgery;  Laterality: N/A;  . MAMMOGRAM  04/14/2019  . pap smear  10/16/2011  . SIGMOIDOSCOPY  11/02/2014   Per [place] New Patient Packet  . TONSILLECTOMY  06/12/1948   Per [place] New Patient Packet.   Social History:  reports that she has quit smoking. She started smoking about 43 years ago. She has never used smokeless tobacco. She reports previous alcohol use of about 7.0 standard drinks of alcohol per week. She reports that she does not use drugs.  Allergies  Allergen Reactions  . Banana Swelling    Swelling around mouth and eyes and red blotches  . Latex Swelling, Rash and Other (See Comments)  . Ciprofloxacin Hcl Other (See Comments)    Severe knee inflammation   .  Gatifloxacin Other (See Comments)    Severe knee inflammation   . Levofloxacin Other (See Comments)    Severe knee inflammation   . Moxifloxacin Other (See Comments)    Severe knee inflammation   . Norfloxacin Other (See Comments)    Severe knee inflammation   . Nsaids Other (See Comments)    Upsets ulcers  . Ofloxacin Other (See Comments)    Severe knee inflammation  . Other Other (See Comments)    FLOXIN  . Penicillins Hives    Tolerates (KEFLEX-cephalexin)   . Methylisothiazolinone Rash and Other (See Comments)    A preservative found in a cream pt used  . Tape Rash   Family History  Problem Relation Age of Onset  . Cancer Mother   . Cancer Father     Prior to Admission medications   Medication Sig Start Date End Date Taking? Authorizing Provider  aspirin EC 81 MG tablet Take 81 mg by mouth daily with breakfast.   Yes [provider]  B Complex-C (B-COMPLEX WITH VITAMIN C) tablet Take 1 tablet by mouth daily.   Yes [provider]  Biotin 5000 MCG TABS Take 5,000 mcg by mouth daily.   Yes [provider]  CALCIUM PO Take 1,200 mg by mouth daily.    Yes [provider]  Cholecalciferol (VITAMIN D3) 50 MCG (2000 UT) TABS Take 2,000 Units by mouth in the morning, at noon, and at bedtime.   Yes [provider]  conjugated estrogens (PREMARIN) vaginal cream Place 1 Applicatorful vaginally 3 (three) times a week. Patient taking differently: Place 0.5 g vaginally 3 (three) times a week. Apply to [facility] 06/25/19  Yes Virgie Dad, MD  gabapentin (NEURONTIN) 100 MG capsule Take 100 mg by mouth at bedtime.   Yes [provider]  Glucosamine HCl 1500 MG TABS Take 1,500 mg by mouth daily.   Yes [provider]  Loperamide-Simethicone 2-125 MG TABS Take 1 tablet by mouth 4 (four) times daily as needed (diarrhea/loose stools).   Yes [provider]  methenamine (HIPREX) 1 g tablet Take 1 g by mouth 2 (two) times  daily with a meal. For UTI Prevention. Last UTI: October 20th, November 5th, and December 28th.   Yes [provider]  Methenamine-Sodium Salicylate (CYSTEX PO) Take 100 mg by mouth daily. ( Cranberry Concentrate )   Yes [provider]  Multiple Vitamin (MULTIVITAMIN WITH MINERALS) TABS tablet Take 1 tablet by mouth daily. One A Day for Women   Yes [provider]  Omeprazole-Sodium Bicarbonate (ZEGERID PO) Take 20 mg by mouth 2 (two) times daily.   Yes [provider]  Polyethyl Glycol-Propyl Glycol (SYSTANE) 0.4-0.3 % SOLN Place 1 drop into both eyes as needed (dry/irritated eyes).   Yes [provider]  Probiotic Product (PROBIOTIC COLON SUPPORT PO) Take 1 tablet by mouth in the morning and at bedtime.   Yes [provider]  traZODone (DESYREL) 50 MG tablet TAKE 1/2 TO 1 TABLET AT BEDTIME AS NEEDED. 08/05/20  Yes Virgie Dad, MD  valACYclovir (VALTREX) 500 MG tablet TAKE (1) TABLET DAILY AS NEEDED. Patient taking differently: Take 500 mg by mouth daily. 03/08/20  Yes Virgie Dad, MD  vitamin C (ASCORBIC ACID) 500 MG tablet Take 500 mg by mouth daily.   Yes [provider]  NON FORMULARY CBD Full spectrum 1 ml as needed for sleep.    [provider]   Physical Exam: Blood pressure (!) 142/74, pulse (!) 25, temperature 98.4 F (36.9 C), temperature source Oral, resp. rate 14, height 5\' 2"  (1.575 m), weight 60.7 kg, SpO2 98 %. Vitals:   08/17/20 1447 08/17/20 1840  BP: (!) 141/51 (!) 142/74  Pulse: 87 (!) 25  Resp: 16 14  Temp: 97.9 F (36.6 C) 98.4 F (36.9 C)  SpO2: 93% 98%     General:  In pain  Eyes: PERRL, looks pale  ENT: Moist  Neck: Supple no JVD  Cardiovascular: RRR, no murmurs  Respiratory: Clear breathing, no crackles no wheezing  Abdomen: Soft, non-tender  Skin: No rash  Musculoskeletal: Back in surgical dressing  Psychiatric: Anxious  Neurologic: No facial droop, moving all  limbs, following simple command. Oriented to person and place, confused about time.  Labs on Admission:  Basic Metabolic Panel: Recent Labs  Lab 08/12/20 0911 08/12/20 1426 08/17/20 1229 08/17/20 1543  NA 139 138 117* 115*  K 3.7 4.0 4.7 4.7  CL  --   --  84*  83*  CO2  --   --  26 25  GLUCOSE  --   --  139* 103*  BUN  --   --  8 8  CREATININE  --   --  0.48 0.46  CALCIUM  --   --  7.6* 7.5*   Liver Function Tests: No results for input(s): AST, ALT, ALKPHOS, BILITOT, PROT, ALBUMIN in the last 168 hours. No results for input(s): LIPASE, AMYLASE in the last 168 hours. No results for input(s): AMMONIA in the last 168 hours. CBC: Recent Labs  Lab 08/12/20 0911 08/12/20 1426 08/17/20 1229  WBC  --   --  11.1*  HGB 10.5* 10.2* 7.8*  HCT 31.0* 30.0* 22.7*  MCV  --   --  96.2  PLT  --   --  264   Cardiac Enzymes: No results for input(s): CKTOTAL, CKMB, CKMBINDEX, TROPONINI in the last 168 hours. BNP: Invalid input(s): POCBNP CBG: Recent Labs  Lab 08/17/20 1117  GLUCAP 145*    Radiological Exams on Admission: DG CHEST PORT 1 VIEW  Result Date: 08/16/2020 CLINICAL DATA:  Short of breath, productive cough EXAM: PORTABLE CHEST 1 VIEW COMPARISON:  10/13/2004 FINDINGS: Hypoventilation with decreased lung volume. Left lower lobe consolidation, moderate. Mild right lower lobe airspace disease. No edema or effusion. IMPRESSION: Bibasilar airspace disease left greater than right. Possible atelectasis versus pneumonia. Electronically Signed   By: Franchot Gallo M.D.   On: 08/16/2020 11:52    EKG:   Time spent: [facility] Pager [phone] 08/17/2020, 6:40 PM

## 2020-08-17 NOTE — Progress Notes (Signed)
Physical Therapy Treatment Patient Details Name: Carol Perez MRN: [id number] DOB: February 19, 1941 Today's Date: 08/17/2020    History of Present Illness 80 y.o. female presents on 08/12/2020 for L5-S1 ALIF and R L2-5 anterolateral lumbar interbody fusion due to low back pain with LLE radiculopathy. PMH includes ostropenia.    PT Comments    Pt with worsening mental status (question if pain medication related), thus limiting progress and safety with mobility. Pt keeping eyes closed throughout majority of session, but unable to state why. Pt taking pivotal steps from bed to chair using walker, before stating, "I need to sit down." BP stable throughout. Also limited in ability to perform exercises due to poor attention span. Performed IS x 5 (pulling 750 ml). Continue to recommend SNF for ongoing Physical Therapy.      Follow Up Recommendations  SNF (per pt discharge plan is for short term rehab at [place])     Equipment Recommendations  Rolling walker with 5" wheels    Recommendations for Other Services       Precautions / Restrictions Precautions Precautions: Fall;Back Precaution Booklet Issued: Yes (comment) Precaution Comments: Reviewed precautions, poor pt recall Required Braces or Orthoses: Spinal Brace Spinal Brace: Lumbar corset;Applied in sitting position Restrictions Weight Bearing Restrictions: No    Mobility  Bed Mobility Overal bed mobility: Needs Assistance Bed Mobility: Rolling;Sidelying to Sit;Sit to Sidelying Rolling: Min guard Sidelying to sit: Min assist       General bed mobility comments: Hand over hand guidance to guide LUE over to railing, pt pushing up with [facility] elevated    Transfers Overall transfer level: Needs assistance Equipment used: Rolling walker (2 wheeled) Transfers: Risk manager;Sit to/from Stand Sit to Stand: Min assist Stand pivot transfers: Min guard       General transfer comment: MinA to power up to stand, cues  for hand placement. Taking pivotal steps over to the chair  Ambulation/Gait                 Stairs             Wheelchair Mobility    Modified Rankin (Stroke Patients Only)       Balance Overall balance assessment: Needs assistance Sitting-balance support: No upper extremity supported;Feet supported Sitting balance-Leahy Scale: Fair     Standing balance support: Bilateral upper extremity supported Standing balance-Leahy Scale: Poor Standing balance comment: reliant on BUE support of RW                            Cognition Arousal/Alertness: Awake/alert Behavior During Therapy: WFL for tasks assessed/performed Overall Cognitive Status: Impaired/Different from baseline Area of Impairment: Memory;Problem solving;Safety/judgement;Orientation                 Orientation Level: Disoriented to;Time   Memory: Decreased short-term memory   Safety/Judgement: Decreased awareness of safety   Problem Solving: Requires verbal cues General Comments: Pt continues with confusion; keeping eyes closed throughout majority of session. When asked why she was keeping her eyes closed, pt stating, "I don't know." Poor attention span and follow through with tasks due to internal distraction.      Exercises General Exercises - Lower Extremity Long Arc Quad: Both;10 reps;Seated Other Exercises Other Exercises: x5 reps of IS (pulling 750 ml)    General Comments        Pertinent Vitals/Pain Pain Assessment: Faces Faces Pain Scale: Hurts whole lot Pain Location: low back Pain  Descriptors / Indicators: Grimacing;Guarding;Operative site guarding Pain Intervention(s): Monitored during session;Limited activity within patient's tolerance    Home Living                      Prior Function            PT Goals (current goals can now be found in the care plan section) Acute Rehab PT Goals Patient Stated Goal: to return to independent mobility PT Goal  Formulation: With patient Time For Goal Achievement: 08/27/20 Potential to Achieve Goals: Good Progress towards PT goals: Not progressing toward goals - comment (worsening mental status)    Frequency    Min 5X/week      PT Plan Current plan remains appropriate    Co-evaluation              AM-PAC PT "6 Clicks" Mobility   Outcome Measure  Help needed turning from your back to your side while in a flat bed without using bedrails?: None Help needed moving from lying on your back to sitting on the side of a flat bed without using bedrails?: A Little Help needed moving to and from a bed to a chair (including a wheelchair)?: A Little Help needed standing up from a chair using your arms (e.g., wheelchair or bedside chair)?: A Little Help needed to walk in hospital room?: A Little Help needed climbing 3-5 steps with a railing? : A Lot 6 Click Score: 18    End of Session Equipment Utilized During Treatment: Back brace Activity Tolerance: Patient limited by fatigue Patient left: with call bell/phone within reach;in bed;with bed alarm set Nurse Communication: Mobility status PT Visit Diagnosis: Other abnormalities of gait and mobility (R26.89);Muscle weakness (generalized) (M62.81);Pain;Unsteadiness on feet (R26.81);Difficulty in walking, not elsewhere classified (R26.2) Pain - part of body:  (back)     Time: 1006-1030 PT Time Calculation (min) (ACUTE ONLY): 24 min  Charges:  $Therapeutic Activity: 23-37 mins                     Wyona Almas, PT, DPT Acute Rehabilitation Services Pager [phone] Office [phone]    Deno Etienne 08/17/2020, 1:36 PM

## 2020-08-18 DIAGNOSIS — R058 Other specified cough: Secondary | ICD-10-CM

## 2020-08-18 DIAGNOSIS — E871 Hypo-osmolality and hyponatremia: Secondary | ICD-10-CM

## 2020-08-18 LAB — BASIC METABOLIC PANEL
Anion gap: 9 (ref 5–15)
BUN: 6 mg/dL — ABNORMAL LOW (ref 8–23)
CO2: 24 mmol/L (ref 22–32)
Calcium: 7.5 mg/dL — ABNORMAL LOW (ref 8.9–10.3)
Chloride: 85 mmol/L — ABNORMAL LOW (ref 98–111)
Creatinine, Ser: 0.46 mg/dL (ref 0.44–1.00)
GFR, Estimated: >60 mL/min (ref >60)
Glucose, Bld: 94 mg/dL (ref 70–99)
Potassium: 4.2 mmol/L (ref 3.5–5.1)
Sodium: 118 mmol/L — CL (ref 135–145)

## 2020-08-18 LAB — CBC
HCT: 21.7 % — ABNORMAL LOW (ref 36.0–46.0)
Hemoglobin: 7.5 g/dL — ABNORMAL LOW (ref 12.0–15.0)
MCH: 32.9 pg (ref 26.0–34.0)
MCHC: 34.6 g/dL (ref 30.0–36.0)
MCV: 95.2 fL (ref 80.0–100.0)
Platelets: 279 10*3/uL (ref 150–400)
RBC: 2.28 MIL/uL — ABNORMAL LOW (ref 3.87–5.11)
RDW: 11.9 % (ref 11.5–15.5)
WBC: 11.1 10*3/uL — ABNORMAL HIGH (ref 4.0–10.5)
nRBC: 0.2 % (ref 0.0–0.2)

## 2020-08-18 LAB — SODIUM
Sodium: 118 mmol/L — CL (ref 135–145)
Sodium: 121 mmol/L — ABNORMAL LOW (ref 135–145)
Sodium: 124 mmol/L — ABNORMAL LOW (ref 135–145)
Sodium: 124 mmol/L — ABNORMAL LOW (ref 135–145)

## 2020-08-18 MED ORDER — FUROSEMIDE 10 MG/ML IJ SOLN
20.0000 mg | Freq: Once | INTRAMUSCULAR | Status: AC
  Administered 2020-08-18: 20 mg via INTRAVENOUS
  Filled 2020-08-18: qty 2

## 2020-08-18 MED ORDER — SENNOSIDES-DOCUSATE SODIUM 8.6-50 MG PO TABS
1.0000 | ORAL_TABLET | Freq: Two times a day (BID) | ORAL | Status: DC
  Administered 2020-08-18 – 2020-08-23 (×6): 1 via ORAL
  Filled 2020-08-18 (×9): qty 1

## 2020-08-18 MED ORDER — POLYETHYLENE GLYCOL 3350 17 G PO PACK
17.0000 g | PACK | Freq: Every day | ORAL | Status: DC
  Administered 2020-08-18 – 2020-08-23 (×3): 17 g via ORAL
  Filled 2020-08-18 (×4): qty 1

## 2020-08-18 MED ORDER — SODIUM CHLORIDE 3 % IV SOLN
INTRAVENOUS | Status: DC
  Administered 2020-08-18: 50 mL/h via INTRAVENOUS
  Filled 2020-08-18 (×3): qty 500

## 2020-08-18 MED ORDER — HYDROCODONE-ACETAMINOPHEN 10-325 MG PO TABS
1.0000 | ORAL_TABLET | ORAL | Status: DC | PRN
  Administered 2020-08-18: 1 via ORAL
  Administered 2020-08-18: 2 via ORAL
  Administered 2020-08-18 (×2): 1 via ORAL
  Administered 2020-08-18 – 2020-08-19 (×3): 2 via ORAL
  Administered 2020-08-20 – 2020-08-21 (×2): 1 via ORAL
  Administered 2020-08-21: 2 via ORAL
  Administered 2020-08-21: 1 via ORAL
  Administered 2020-08-22: 2 via ORAL
  Administered 2020-08-22 – 2020-08-23 (×3): 1 via ORAL
  Administered 2020-08-23: 2 via ORAL
  Filled 2020-08-18: qty 1
  Filled 2020-08-18: qty 2
  Filled 2020-08-18 (×2): qty 1
  Filled 2020-08-18 (×2): qty 2
  Filled 2020-08-18: qty 1
  Filled 2020-08-18 (×2): qty 2
  Filled 2020-08-18: qty 1
  Filled 2020-08-18: qty 2
  Filled 2020-08-18 (×4): qty 1
  Filled 2020-08-18: qty 2

## 2020-08-18 MED ORDER — BOOST PLUS PO LIQD
237.0000 mL | Freq: Three times a day (TID) | ORAL | Status: DC
  Administered 2020-08-19 – 2020-08-23 (×12): 237 mL via ORAL
  Filled 2020-08-18 (×19): qty 237

## 2020-08-18 MED ORDER — CHLORHEXIDINE GLUCONATE CLOTH 2 % EX PADS
6.0000 | MEDICATED_PAD | Freq: Every day | CUTANEOUS | Status: DC
  Administered 2020-08-18 – 2020-08-23 (×5): 6 via TOPICAL

## 2020-08-18 NOTE — Progress Notes (Signed)
Physical Therapy Treatment Patient Details Name: Carol Perez MRN: [id number] DOB: 1940/07/25 Today's Date: 08/18/2020    History of Present Illness 80 y.o. female presents on 08/12/2020 for L5-S1 ALIF and R L2-5 anterolateral lumbar interbody fusion due to low back pain with LLE radiculopathy. PMH includes ostropenia.    PT Comments    Pt with improved mental status today compared to yesterday, but limited in mobility by generalized, diffuse pain and fatigue. Requiring min assist for bed mobility. Able to participate in a few seated exercises for BLE strengthening and perform IS x 10. BP sitting edge of bed 149/76. Pt deferring out of bed mobility and requesting to lie back down. Will continue to progress as tolerated.     Follow Up Recommendations  SNF (per pt discharge plan is for short term rehab at [place])     Equipment Recommendations  Rolling walker with 5" wheels    Recommendations for Other Services       Precautions / Restrictions Precautions Precautions: Fall;Back Precaution Booklet Issued: Yes (comment) Precaution Comments: Reviewed precautions, poor pt recall Required Braces or Orthoses: Spinal Brace Spinal Brace: Lumbar corset;Applied in sitting position Restrictions Weight Bearing Restrictions: No    Mobility  Bed Mobility Overal bed mobility: Needs Assistance Bed Mobility: Rolling;Sidelying to Sit;Sit to Sidelying Rolling: Min assist Sidelying to sit: Min assist     Sit to sidelying: Min assist General bed mobility comments: Guidance and verbal cues for rolling to left side, use of rail, assist for power up to sitting and helping legs back into bed    Transfers                 General transfer comment: Deferred by patient  Ambulation/Gait                 Stairs             Wheelchair Mobility    Modified Rankin (Stroke Patients Only)       Balance Overall balance assessment: Needs assistance Sitting-balance  support: No upper extremity supported;Feet supported Sitting balance-Leahy Scale: Fair                                      Cognition Arousal/Alertness: Awake/alert Behavior During Therapy: WFL for tasks assessed/performed Overall Cognitive Status: Impaired/Different from baseline Area of Impairment: Memory;Problem solving;Safety/judgement                     Memory: Decreased short-term memory   Safety/Judgement: Decreased awareness of safety   Problem Solving: Requires verbal cues General Comments: A&Ox4 this session, keeps eyes closed through majority of session, follows 1 step commands. Internally distracted by pain      Exercises General Exercises - Lower Extremity Ankle Circles/Pumps: Both;10 reps;Seated Long Arc Quad: Both;10 reps;Seated Other Exercises Other Exercises: x10 reps of IS (pulling 250-750 ml)    General Comments        Pertinent Vitals/Pain Pain Assessment: Faces Faces Pain Scale: Hurts whole lot Pain Location: generalized Pain Descriptors / Indicators: Grimacing;Aching Pain Intervention(s): Limited activity within patient's tolerance;Monitored during session    Home Living                      Prior Function            PT Goals (current goals can now be found in the care plan section)  Acute Rehab PT Goals Patient Stated Goal: to return to independent mobility PT Goal Formulation: With patient Time For Goal Achievement: 08/27/20 Potential to Achieve Goals: Good Progress towards PT goals: Not progressing toward goals - comment    Frequency    Min 5X/week      PT Plan Current plan remains appropriate    Co-evaluation              AM-PAC PT "6 Clicks" Mobility   Outcome Measure  Help needed turning from your back to your side while in a flat bed without using bedrails?: None Help needed moving from lying on your back to sitting on the side of a flat bed without using bedrails?: A Little Help  needed moving to and from a bed to a chair (including a wheelchair)?: A Little Help needed standing up from a chair using your arms (e.g., wheelchair or bedside chair)?: A Little Help needed to walk in hospital room?: A Little Help needed climbing 3-5 steps with a railing? : A Lot 6 Click Score: 18    End of Session   Activity Tolerance: Patient limited by fatigue;Patient limited by pain Patient left: with call bell/phone within reach;in bed;with bed alarm set Nurse Communication: Mobility status PT Visit Diagnosis: Other abnormalities of gait and mobility (R26.89);Muscle weakness (generalized) (M62.81);Pain;Unsteadiness on feet (R26.81);Difficulty in walking, not elsewhere classified (R26.2) Pain - part of body:  (back)     Time: 1440-1456 PT Time Calculation (min) (ACUTE ONLY): 16 min  Charges:  $Therapeutic Activity: 8-22 mins                     Wyona Almas, PT, DPT Acute Rehabilitation Services Pager [phone] Office [phone]    Carol Perez 08/18/2020, 4:58 PM

## 2020-08-18 NOTE — Progress Notes (Addendum)
Nephrology Follow-Up Consult note   Assessment/Recommendations: Carol Perez is a/an 80 y.o. female with a past medical history significant for recurrent UTI, lymphocytic colitis, back pain, admitted for back surgery related to lumbar stenosis with radiculopathy complicated by hyponatremia.     Severe symptomatic hyponatremia: Hyponatremia remains severe but symptoms slightly improved.  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 -Increase hypertonic saline to 50 cc/h -Continue to monitor sodium every 4 hours -Goal correction will be 124-126 in 24 hours -Restrict fluids to 1.5 L daily -Pain control per primary team -One-time IV Lasix 20 mg to help with loss of free water  Encephalopathy: Likely related to hyponatremia but also multifactorial.  Treatment of hyponatremia as above  Anemia: Also likely multifactorial.  Transfusion per primary team.  No indication for ESA  Shortness of breath/concern for pneumonia: Management per primary  Possible urinary retention: Difficulty voiding per daughter.  Bladder scans and consider Foley catheter placement for PVR greater than 350  Recommendations conveyed to primary service.    [facility] 08/18/2020 12:01 PM  ___________________________________________________________  CC: Hyponatremia  Interval History/Subjective: Sodium slightly improved to 118 from yesterday.  Daughter at bedside says patient is less confused today.  Patient is complaining of extreme pain in her left lower extremity radiating from her back.  She recently received pain medication.  Daughter states that patient is having a difficult time voiding.   Medications:  Current Facility-Administered Medications  Medication Dose Route Frequency Provider Last Rate Last Admin  . 0.9 %  sodium chloride infusion  250 mL Intravenous Continuous Erline Levine, MD 1 mL/hr at  08/12/20 2311 250 mL at 08/12/20 2311  . acetaminophen (TYLENOL) tablet 650 mg  650 mg Oral Q4H PRN Erline Levine, MD       Or  . acetaminophen (TYLENOL) suppository 650 mg  650 mg Rectal Q4H PRN Erline Levine, MD      . alum & mag hydroxide-simeth (MAALOX/MYLANTA) 200-200-20 MG/5ML suspension 30 mL  30 mL Oral Q6H PRN Erline Levine, MD   30 mL at 08/15/20 0956  . ascorbic acid (VITAMIN C) tablet 500 mg  500 mg Oral Daily Erline Levine, MD   500 mg at 08/18/20 0910  . aspirin EC tablet 81 mg  81 mg Oral Q breakfast Erline Levine, MD   81 mg at 08/18/20 6387  . B-complex with vitamin C tablet 1 tablet  1 tablet Oral Daily Erline Levine, MD   1 tablet at 08/18/20 0910  . bisacodyl (DULCOLAX) suppository 10 mg  10 mg Rectal Daily PRN Erline Levine, MD   10 mg at 08/14/20 0249  . calcium carbonate (OS-CAL - dosed in mg of elemental calcium) tablet 500 mg of elemental calcium  1 tablet Oral Q breakfast Erline Levine, MD   500 mg of elemental calcium at 08/18/20 0909  . cholecalciferol (VITAMIN D3) tablet 2,000 Units  2,000 Units Oral TID Erline Levine, MD   2,000 Units at 08/18/20 0909  . conjugated estrogens (PREMARIN) vaginal cream 5.64 Applicatorful  0.5 g Vaginal Once per day on Mon Wed Fri Erline Levine, MD   3.32 Applicatorful at 95/18/84 0912  . furosemide (LASIX) injection 20 mg  20 mg Intravenous Once Reesa Chew, MD      . HYDROcodone-acetaminophen [facility]) 10-325 MG per tablet 1-2 tablet  1-2 tablet Oral Q4H PRN Rise Patience, MD   1 tablet  at 08/18/20 0909  . HYDROmorphone (DILAUDID) injection 0.5 mg  0.5 mg Intravenous Q2H PRN Erline Levine, MD   0.5 mg at 08/17/20 1713  . lactose free nutrition (BOOST PLUS) liquid 237 mL  237 mL Oral TID WC Lavina Hamman, MD      . loperamide (IMODIUM) capsule 2 mg  2 mg Oral QID PRN Erline Levine, MD       And  . simethicone [facility]) chewable tablet 160 mg  160 mg Oral QID PRN Erline Levine, MD      . menthol-cetylpyridinium (CEPACOL)  lozenge 3 mg  1 lozenge Oral PRN Erline Levine, MD       Or  . phenol (CHLORASEPTIC) mouth spray 1 spray  1 spray Mouth/Throat PRN Erline Levine, MD      . methenamine (MANDELAMINE) tablet 1,000 mg  1,000 mg Oral BID WC Erline Levine, MD   1,000 mg at 08/18/20 0909  . methocarbamol (ROBAXIN) tablet 500 mg  500 mg Oral Q6H PRN Erline Levine, MD   500 mg at 08/18/20 6734   Or  . methocarbamol (ROBAXIN) 500 mg in dextrose 5 % 50 mL IVPB  500 mg Intravenous Q6H PRN Erline Levine, MD      . multivitamin with minerals tablet 1 tablet  1 tablet Oral Daily Erline Levine, MD   1 tablet at 08/18/20 0909  . ondansetron (ZOFRAN) tablet 4 mg  4 mg Oral Q6H PRN Erline Levine, MD       Or  . ondansetron [facility]) injection 4 mg  4 mg Intravenous Q6H PRN Erline Levine, MD   4 mg at 08/15/20 0035  . oxyCODONE (Oxy IR/ROXICODONE) immediate release tablet 5 mg  5 mg Oral Q3H PRN Erline Levine, MD   5 mg at 08/15/20 1639  . pantoprazole (PROTONIX) EC tablet 40 mg  40 mg Oral QHS von Verdene Rio B, RPH   40 mg at 08/16/20 2112  . polyethylene glycol (MIRALAX / GLYCOLAX) packet 17 g  17 g Oral Daily Lavina Hamman, MD      . polyvinyl alcohol (LIQUIFILM TEARS) 1.4 % ophthalmic solution 1 drop  1 drop Both Eyes PRN Erline Levine, MD      . senna-docusate (Senokot-S) tablet 1 tablet  1 tablet Oral BID Lavina Hamman, MD      . sodium chloride (hypertonic) 3 % solution   Intravenous Continuous Reesa Chew, MD 50 mL/hr at 08/18/20 0916 50 mL/hr at 08/18/20 0916  . sodium chloride flush (NS) 0.9 % injection 3 mL  3 mL Intravenous Q12H Erline Levine, MD   3 mL at 08/18/20 0913  . sodium chloride flush (NS) 0.9 % injection 3 mL  3 mL Intravenous PRN Erline Levine, MD      . tamsulosin [facility]) capsule 0.4 mg  0.4 mg Oral Daily Erline Levine, MD   0.4 mg at 08/18/20 0909  . valACYclovir (VALTREX) tablet 500 mg  500 mg Oral Daily Erline Levine, MD   500 mg at 08/18/20 1937      Review of Systems: 10 systems  reviewed and negative except per interval history/subjective  Physical Exam: Vitals:   08/18/20 0832 08/18/20 1139  BP: (!) 152/71 139/65  Pulse: 65 73  Resp: 15 16  Temp: 97.6 F (36.4 C) 97.8 F (36.6 C)  SpO2: 91% 98%   No intake/output data recorded.  Intake/Output Summary (Last 24 hours) at 08/18/2020 1201 Last data filed at 08/18/2020 0539 Gross per 24 hour  Intake 100 ml  Output 550 ml  Net -450 ml   Constitutional: Lying in bed, in distress because of pain ENMT: ears and nose without scars or lesions, MMM CV: normal rate, no edema Respiratory: clear to auscultation, normal work of breathing Gastrointestinal: soft, non-tender, no palpable masses or hernias Skin: no visible lesions or rashes Psych: Mild confusion but awake, alert, answers questions appropriately   Test Results I personally reviewed new and old clinical labs and radiology tests Lab Results  Component Value Date   NA 118 (LL) 08/18/2020   K 4.2 08/18/2020   CL 85 (L) 08/18/2020   CO2 24 08/18/2020   BUN 6 (L) 08/18/2020   CREATININE 0.46 08/18/2020   CALCIUM 7.5 (L) 08/18/2020

## 2020-08-18 NOTE — Progress Notes (Addendum)
   Providing Compassionate, Quality Care - Together   Subjective: RR event overnight due to somnolence. Pain medication adjusted by the Hospitalist. Sodium level is slowly improving. Patient c/o chest pain this morning.  Objective: Vital signs in last 24 hours: Temp:  [97.6 F (36.4 C)-99.1 F (37.3 C)] 97.6 F (36.4 C) (03/09 0832) Pulse Rate:  [25-87] 65 (03/09 0832) Resp:  [10-19] 15 (03/09 0832) BP: (141-158)/(51-74) 152/71 (03/09 0832) SpO2:  [91 %-99 %] 91 % (03/09 0832)  Intake/Output from previous day: 03/08 0701 - 03/09 0700 In: 300 [P.O.:300] Out: 550 [Urine:550] Intake/Output this shift: No intake/output data recorded.  Somnolent, responds to voice PERRLA CN II-XII grossly intact MAE, Strength and sensation intact Incision is covered with Honeycomb dressing and Steri Strips; Dressing is clean, dry, and intact  Lab Results: Recent Labs    08/17/20 1229 08/18/20 0552  WBC 11.1* 11.1*  HGB 7.8* 7.5*  HCT 22.7* 21.7*  PLT 264 279   BMET Recent Labs    08/17/20 1543 08/17/20 1804 08/18/20 0057 08/18/20 0552  NA 115*   < > 118* 118*  K 4.7  --   --  4.2  CL 83*  --   --  85*  CO2 25  --   --  24  GLUCOSE 103*  --   --  94  BUN 8  --   --  6*  CREATININE 0.46  --   --  0.46  CALCIUM 7.5*  --   --  7.5*   < > = values in this interval not displayed.    Studies/Results: DG CHEST PORT 1 VIEW  Result Date: 08/16/2020 CLINICAL DATA:  Short of breath, productive cough EXAM: PORTABLE CHEST 1 VIEW COMPARISON:  10/13/2004 FINDINGS: Hypoventilation with decreased lung volume. Left lower lobe consolidation, moderate. Mild right lower lobe airspace disease. No edema or effusion. IMPRESSION: Bibasilar airspace disease left greater than right. Possible atelectasis versus pneumonia. Electronically Signed   By: Franchot Gallo M.D.   On: 08/16/2020 11:52    Assessment/Plan: Patient issix days s/pL5-S1 ALIF with right L2-L5 XLIF with percutaneous pedicle screws at  L2-S1. She has been recovering well and reports a resolution of her preoperative symptoms.She has been having some issues with pain controlShe has worked with [facility]. Continue TLSO brace when OOB. Patient has discussed and planned to do rehab at [facility]. She developed increased somnolence on 08/17/2020. Lab work revealed sodium level of 117. The Hospitalists were consulted for further assistance with medical management.   LOS: 6 days    -SNF placement on hold until patient is medically stable. -Mobilize as tolerated when patient's mental status improves -EKG ordered by Dr. Candiss Norse -Hypertonic saline infusing; will transfer to ICU per protocol  Appreciate Internal Medicine's input on this patient's care.   Viona Gilmore, DNP, AGNP-C Nurse Practitioner  [facility] [place] [address], [place], [place] [address] P: [phone]    F: [phone]  08/18/2020, 10:33 AM

## 2020-08-18 NOTE — Plan of Care (Signed)
  Problem: Education: Goal: Knowledge of General Education information will improve Description Including pain rating scale, medication(s)/side effects and non-pharmacologic comfort measures Outcome: Progressing   Problem: Health Behavior/Discharge Planning: Goal: Ability to manage health-related needs will improve Outcome: Progressing   

## 2020-08-18 NOTE — Progress Notes (Addendum)
2200- When this RN went into room to give patient 2200 medications, patient was arousable to verbal commands and would follow commands but was somnolent and was only alert to self which is changed from beginning of shift where patient was conversant and oriented. Patient able to follow commands, purposeful movement, pupils 3, equal and reactive. VSS besides respirations even and unlabored between 8 and 10 breaths per minute. Medications held due to somnolence. Dr. Candiss Norse paged at 2205 for altered mental status and again at 2244 for updated sodium level and continued altered mental status. No new orders received. Rapid response called at 2253 and did not recommend any current interventions, see note in chart, plan to call MD when next sodium level returns or if patient mental status worsens.  0000- Patient much more alert and is oriented to self, place, and time, still slightly disoriented to situation.  Still awaiting sodium results, lab called to ensure sodium would still be taken for the 0000 labs.  0200- critical lab of 118 sodium received, MD Hal Hope paged, new order for Norco 1-2 tablets instead of 2 tablets received, patient tolerated lower dose well when experiencing pain again at 0328.

## 2020-08-18 NOTE — Progress Notes (Signed)
Per chart review and nursing notes, patient had a significant event/rapid response for somnolence. Per documentation, I was paged at 2205 and 2244 (was on-call for nephrology service overnight). I did not receive any of these pages or calls. Labs were monitored overnight. Covering nephrologist to take over.  Gean Quint, MD [facility]

## 2020-08-18 NOTE — TOC Progression Note (Signed)
Transition of Care [facility]) - Progression Note    Patient Details  Name: SYBELLA HARNISH MRN: [id number] Date of Birth: 10/08/1940  Transition of Care [facility]) CM/SW [place], [place] Phone Number: 08/18/2020, 11:23 AM  Clinical Narrative:     CSW confirmed patient will discharge to [facility] for short term rehab when medically stable.   Thurmond Butts, MSW, LCSW Clinical Social Worker   Expected Discharge Plan: Skilled Nursing Facility Barriers to Discharge: SNF Pending bed offer,Continued Medical Work up,Insurance Authorization  Expected Discharge Plan and Services Expected Discharge Plan: [place] In-house Referral: Clinical Social Work     Living arrangements for the past 2 months: Single Family Home                                       Social Determinants of Health (SDOH) Interventions    Readmission Risk Interventions No flowsheet data found.

## 2020-08-18 NOTE — Progress Notes (Addendum)
[facility] Consultation Progress Note  Patient: Carol Perez ZWC:[id number]   PCP: Virgie Dad, MD DOB: [birth year]   DOA: 08/12/2020   DOS: 08/18/2020   Date of Service: the patient was seen and examined on 08/18/2020 Primary service: Erline Levine, MD    Brief hospital course: Pt. with PMH of recurrent UTI on prophylaxis, lymphocytic colitis, chronic lumbago; admitted on 08/12/2020, with complaint of worsening back pain admitted for lumbar surgery.underwent elective lumbar spine fusion 2-3, 3-4, 4-5.  Postoperatively patient had poor p.o. intake and started having worsening sodium level with hyponatremia.  Also had worsening mental status change as well as anemia and therefore hospitalist was consulted.  Nephrology also consulted.  Currently further plan is monitor sodium level and treatment under nephrology guidance, monitor H&H and transfuse for hemoglobin less than 7, monitor mental status and adjust meds appropriately.  Subjective: Still reports severe pain.  No nausea no vomiting.  No fever no chills.  No diarrhea no constipation.  Oral intake minimal.  Assessment and Plan: 1.  SIADH Severe symptomatic hyponatremia. Management per nephrology. Appears also volume overloaded. Patient was receiving half-normal saline which can contribute as well. On trazodone and gabapentin which can also contribute to SIADH. Urine sodium 79, osmolality 427.  Serum osmolality 240. Started on hypertonic saline 3% on 3/8. Sodium correcting appropriately. Received 1 dose of IV Lasix on 3/9. Neuro checks every 4 hours, sodium level check every 4 hours. Patient will be transferred to ICU for hypertonic saline treatment under neurosurgery care.  Addendum 7 PM. Sodium level 124.  Discussed with nephrology.  3% hypertonic saline ordered currently expired.  They will let it expire and monitor sodium overnight without restarting it.  RN informed about this as well.  2.  Acute metabolic encephalopathy In  the setting of severe hyponatremia. As well as polypharmacy in hospital induced delirium. Minimizing narcotic medication. Discontinue gabapentin and trazodone. Monitor for response. Currently no focal deficit on examination.  No asterixis on examination.  3.  Postop acute blood loss anemia. Likely there is also component of dilution. H&H daily.  Transfuse for hemoglobin less than 7.  4.  Cough. Chest x-ray negative for any acute pneumonia. Monitor for now. Aspiration precautions.  5.  Urinary retention. As needed bladder scan.  Consider Foley catheter placement if problem continues.  6.  Constipation. Bowel regimen initiated.  7.  Poor p.o. intake. Lactose intolerant. Added boost.  8.  Chest tightness.  Intermittent, atypical, reproducible EKG unremarkable. Pain present for last few days without any resolution. Chest x-ray earlier unremarkable. Continue to monitor for now.  Recommendation on discharge: To be determined   Diet: Regular diet DVT Prophylaxis: SCD, pharmacological prophylaxis contraindicated due to recent neuro surgery   Family Communication: family was present at bedside, at the time of interview. The pt provided permission to discuss medical plan with the family. Opportunity was given to ask question and all questions were answered satisfactorily.   Disposition: We will continue to follow the patient.   Other Consultants: Nephrology Procedures: none Antibiotics: Anti-infectives (From admission, onward)   Start     Dose/Rate Route Frequency Ordered Stop   08/13/20 1000  valACYclovir (VALTREX) tablet 500 mg        500 mg Oral Daily 08/12/20 2048     08/13/20 0800  methenamine (HIPREX) tablet 1 g  Status:  Discontinued       Note to Pharmacy: For UTI Prevention. Last UTI: October 20th, November 5th, and December 28th.  1 g Oral 2 times daily with meals 08/12/20 2048 08/12/20 2107   08/13/20 0800  methenamine (MANDELAMINE) tablet 1,000 mg         1,000 mg Oral 2 times daily with meals 08/12/20 2108     08/12/20 2230  vancomycin (VANCOREADY) IVPB 500 mg/100 mL        500 mg 100 mL/hr over 60 Minutes Intravenous  Once 08/12/20 2139 08/13/20 0853   08/12/20 0600  vancomycin (VANCOCIN) IVPB 1000 mg/200 mL premix        1,000 mg 200 mL/hr over 60 Minutes Intravenous On call to O.R. 08/12/20 0536 08/12/20 0910   08/12/20 0545  vancomycin (VANCOCIN) 1-5 GM/200ML-% IVPB       Note to Pharmacy: Tamsen Snider   : cabinet override      08/12/20 0545 08/12/20 1759      Objective: Physical Exam: Vitals:   08/17/20 2304 08/18/20 0305 08/18/20 0832 08/18/20 1139  BP: (!) 150/65 (!) 158/71 (!) 152/71 139/65  Pulse: 82  65 73  Resp: 10 11 15 16   Temp: 99.1 F (37.3 C) 98.4 F (36.9 C) 97.6 F (36.4 C) 97.8 F (36.6 C)  TempSrc: Oral Oral Oral Oral  SpO2: 99% 98% 91% 98%  Weight:      Height:        Intake/Output Summary (Last 24 hours) at 08/18/2020 1626 Last data filed at 08/18/2020 1520 Gross per 24 hour  Intake 372.17 ml  Output 1350 ml  Net -977.83 ml   Filed Weights   08/12/20 0557  Weight: 60.7 kg   General: Appear in mild distress, no Rash; Oral Mucosa Clear, moist. no Abnormal Neck Mass Or lumps, Conjunctiva normal  Cardiovascular: S1 and S2 Present, aortic systolic  Murmur, Respiratory: good respiratory effort, Bilateral Air entry present and Clear to Auscultation, no Crackles, no wheezes Abdomen: Bowel Sound present, Soft and no tenderness Extremities: trace Pedal edema, no calf tenderness Neurology: alert and oriented to place and person affect flat in affect. no new focal deficit Gait not checked due to patient safety concerns   Data Reviewed: CBC: Recent Labs  Lab 08/12/20 0911 08/12/20 1426 08/17/20 1229 08/18/20 0552  WBC  --   --  11.1* 11.1*  HGB 10.5* 10.2* 7.8* 7.5*  HCT 31.0* 30.0* 22.7* 21.7*  MCV  --   --  96.2 95.2  PLT  --   --  264 726   Basic Metabolic Panel: Recent Labs  Lab  08/12/20 0911 08/12/20 1426 08/17/20 1229 08/17/20 1543 08/17/20 1804 08/17/20 2133 08/18/20 0057 08/18/20 0552 08/18/20 1337  NA 139 138 117* 115* 114* 115* 118* 118* 121*  K 3.7 4.0 4.7 4.7  --   --   --  4.2  --   CL  --   --  84* 83*  --   --   --  85*  --   CO2  --   --  26 25  --   --   --  24  --   GLUCOSE  --   --  139* 103*  --   --   --  94  --   BUN  --   --  8 8  --   --   --  6*  --   CREATININE  --   --  0.48 0.46  --   --   --  0.46  --   CALCIUM  --   --  7.6* 7.5*  --   --   --  7.5*  --    Liver Function Tests: No results for input(s): AST, ALT, ALKPHOS, BILITOT, PROT, ALBUMIN in the last 168 hours. No results for input(s): LIPASE, AMYLASE in the last 168 hours. No results for input(s): AMMONIA in the last 168 hours. Cardiac Enzymes: No results for input(s): CKTOTAL, CKMB, CKMBINDEX, TROPONINI in the last 168 hours. BNP (last 3 results) No results for input(s): BNP in the last 8760 hours. CBG: Recent Labs  Lab 08/17/20 1117  GLUCAP 145*   Recent Results (from the past 240 hour(s))  SARS CORONAVIRUS 2 (TAT 6-24 HRS) Nasopharyngeal Nasopharyngeal Swab     Status: None   Collection Time: 08/09/20 11:24 AM   Specimen: Nasopharyngeal Swab  Result Value Ref Range Status   SARS Coronavirus 2 NEGATIVE NEGATIVE Final    Comment: (NOTE) SARS-CoV-2 target nucleic acids are NOT DETECTED.  The SARS-CoV-2 RNA is generally detectable in upper and lower respiratory specimens during the acute phase of infection. Negative results do not preclude SARS-CoV-2 infection, do not rule out co-infections with other pathogens, and should not be used as the sole basis for treatment or other patient management decisions. Negative results must be combined with clinical observations, patient history, and epidemiological information. The expected result is Negative.  Fact Sheet for Patients: SugarRoll.be  Fact Sheet for Healthcare  Providers: https://www.woods-mathews.com/  This test is not yet approved or cleared by the [place] FDA and  has been authorized for detection and/or diagnosis of SARS-CoV-2 by FDA under an Emergency Use Authorization (EUA). This EUA will remain  in effect (meaning this test can be used) for the duration of the COVID-19 declaration under Se ction 564(b)(1) of the Act, 21 U.S.C. section 360bbb-3(b)(1), unless the authorization is terminated or revoked sooner.  Performed at [facility], [place] [address] [place], [place] [address]   Surgical pcr screen     Status: None   Collection Time: 08/09/20  2:54 PM   Specimen: Nasal Mucosa; Nasal Swab  Result Value Ref Range Status   MRSA, PCR NEGATIVE NEGATIVE Final   Staphylococcus aureus NEGATIVE NEGATIVE Final    Comment: (NOTE) The Xpert SA Assay (FDA approved for NASAL specimens in patients 26 years of age and older), is one component of a comprehensive surveillance program. It is not intended to diagnose infection nor to guide or monitor treatment. Performed at [facility], [place] [address], [place], [place] [address]     Studies: No results found.   Scheduled Meds: . vitamin C  500 mg Oral Daily  . aspirin EC  81 mg Oral Q breakfast  . B-complex with vitamin C  1 tablet Oral Daily  . calcium carbonate  1 tablet Oral Q breakfast  . cholecalciferol  2,000 Units Oral TID  . conjugated estrogens  0.5 g Vaginal Once per day on Mon Wed Fri  . lactose free nutrition  237 mL Oral TID WC  . methenamine  1,000 mg Oral BID WC  . multivitamin with minerals  1 tablet Oral Daily  . pantoprazole  40 mg Oral QHS  . polyethylene glycol  17 g Oral Daily  . senna-docusate  1 tablet Oral BID  . sodium chloride flush  3 mL Intravenous Q12H  . tamsulosin  0.4 mg Oral Daily  . valACYclovir  500 mg Oral Daily   Continuous Infusions: . sodium chloride 250 mL (08/12/20 2311)  . methocarbamol (ROBAXIN) IV    . sodium  chloride (hypertonic) 50 mL/hr (08/18/20 0916)  PRN Meds: acetaminophen **OR** acetaminophen, alum & mag hydroxide-simeth, bisacodyl, HYDROcodone-acetaminophen, HYDROmorphone (DILAUDID) injection, loperamide **AND** simethicone, menthol-cetylpyridinium **OR** phenol, methocarbamol **OR** methocarbamol (ROBAXIN) IV, ondansetron **OR** ondansetron (ZOFRAN) IV, oxyCODONE, polyvinyl alcohol, sodium chloride flush  Time spent: 35 minutes  Author: Berle Mull, MD [facility] 08/18/2020 4:26 PM  To reach On-call, see care teams to locate the attending and reach out to them via [email or web address]. If 7PM-7AM, please contact night-coverage If you still have difficulty reaching the attending provider, please page the [facility] (Director on Call) for [facility] on amion for assistance.

## 2020-08-19 DIAGNOSIS — E871 Hypo-osmolality and hyponatremia: Secondary | ICD-10-CM | POA: Diagnosis not present

## 2020-08-19 LAB — SODIUM
Sodium: 122 mmol/L — ABNORMAL LOW (ref 135–145)
Sodium: 121 mmol/L — ABNORMAL LOW (ref 135–145)
Sodium: 122 mmol/L — ABNORMAL LOW (ref 135–145)
Sodium: 125 mmol/L — ABNORMAL LOW (ref 135–145)

## 2020-08-19 LAB — MAGNESIUM: Magnesium: 1.6 mg/dL — ABNORMAL LOW (ref 1.7–2.4)

## 2020-08-19 LAB — CBC
HCT: 22.3 % — ABNORMAL LOW (ref 36.0–46.0)
Hemoglobin: 7.6 g/dL — ABNORMAL LOW (ref 12.0–15.0)
MCH: 33.2 pg (ref 26.0–34.0)
MCHC: 34.1 g/dL (ref 30.0–36.0)
MCV: 97.4 fL (ref 80.0–100.0)
Platelets: 346 10*3/uL (ref 150–400)
RBC: 2.29 MIL/uL — ABNORMAL LOW (ref 3.87–5.11)
RDW: 12.3 % (ref 11.5–15.5)
WBC: 12 10*3/uL — ABNORMAL HIGH (ref 4.0–10.5)
nRBC: 0.4 % — ABNORMAL HIGH (ref 0.0–0.2)

## 2020-08-19 LAB — CORTISOL-AM, BLOOD: Cortisol - AM: 24 ug/dL — ABNORMAL HIGH (ref 6.7–22.6)

## 2020-08-19 LAB — BASIC METABOLIC PANEL
Anion gap: 8 (ref 5–15)
BUN: 9 mg/dL (ref 8–23)
CO2: 27 mmol/L (ref 22–32)
Calcium: 8.1 mg/dL — ABNORMAL LOW (ref 8.9–10.3)
Chloride: 87 mmol/L — ABNORMAL LOW (ref 98–111)
Creatinine, Ser: 0.54 mg/dL (ref 0.44–1.00)
GFR, Estimated: >60 mL/min (ref >60)
Glucose, Bld: 143 mg/dL — ABNORMAL HIGH (ref 70–99)
Potassium: 3.8 mmol/L (ref 3.5–5.1)
Sodium: 122 mmol/L — ABNORMAL LOW (ref 135–145)

## 2020-08-19 MED ORDER — UREA 15 G PO PACK
30.0000 g | PACK | Freq: Two times a day (BID) | ORAL | Status: DC
  Administered 2020-08-19 – 2020-08-20 (×2): 30 g via ORAL
  Filled 2020-08-19 (×4): qty 2

## 2020-08-19 MED ORDER — MAGNESIUM SULFATE 2 GM/50ML IV SOLN
2.0000 g | Freq: Once | INTRAVENOUS | Status: AC
  Administered 2020-08-19: 2 g via INTRAVENOUS
  Filled 2020-08-19: qty 50

## 2020-08-19 MED ORDER — DEXAMETHASONE SODIUM PHOSPHATE 4 MG/ML IJ SOLN
4.0000 mg | Freq: Four times a day (QID) | INTRAMUSCULAR | Status: AC
  Administered 2020-08-19 – 2020-08-21 (×8): 4 mg via INTRAVENOUS
  Filled 2020-08-19 (×8): qty 1

## 2020-08-19 MED ORDER — UREA 15 G PO PACK
15.0000 g | PACK | Freq: Two times a day (BID) | ORAL | Status: DC
  Administered 2020-08-19: 15 g via ORAL
  Filled 2020-08-19: qty 1

## 2020-08-19 MED ORDER — FUROSEMIDE 10 MG/ML IJ SOLN
20.0000 mg | Freq: Once | INTRAMUSCULAR | Status: AC
  Administered 2020-08-19: 20 mg via INTRAVENOUS
  Filled 2020-08-19: qty 2

## 2020-08-19 MED ORDER — UREA 15 G PO PACK
15.0000 g | PACK | Freq: Two times a day (BID) | ORAL | Status: DC
  Filled 2020-08-19 (×2): qty 1

## 2020-08-19 NOTE — Progress Notes (Signed)
[facility] Consultation Progress Note  Patient: Carol Perez OVZ:[id number]   PCP: Virgie Dad, MD DOB: [birth year]   DOA: 08/12/2020   DOS: 08/19/2020   Date of Service: the patient was seen and examined on 08/19/2020 Primary service: Erline Levine, MD    Brief hospital course: Pt. with PMH of recurrent UTI on prophylaxis, lymphocytic colitis, chronic lumbago; admitted on 08/12/2020, with complaint of worsening back pain admitted for lumbar surgery.underwent elective lumbar spine fusion 2-3, 3-4, 4-5.  Postoperatively patient had poor p.o. intake and started having worsening sodium level with hyponatremia.  Also had worsening mental status change as well as anemia and therefore hospitalist was consulted.  Nephrology also consulted.  Currently further plan is monitor sodium level and treatment under nephrology guidance, monitor H&H and transfuse for hemoglobin less than 7, monitor mental status and adjust meds appropriately.  Subjective: Per family patient appears to be more alert and awake.  No nausea no vomiting but no fever no chills.  Pain actually well controlled.  No chest pain reported by the patient.  Assessment and Plan: 1.  SIADH Severe symptomatic hyponatremia. Management per nephrology. Appears also volume overloaded. Patient was receiving half-normal saline which can contribute as well. On trazodone and gabapentin which can also contribute to SIADH. Urine sodium 79, osmolality 427.  Serum osmolality 240. Started on hypertonic saline 3% on 3/8. Sodium correcting appropriately. Received intermittent Lasix. Neuro checks every 4 hours, sodium level check every 4 hours.  2.  Acute metabolic encephalopathy Mentation appears to be improving. In the setting of severe hyponatremia. As well as polypharmacy in hospital induced delirium. Minimizing narcotic medication. Discontinue gabapentin and trazodone. Monitor for response. Currently no focal deficit on examination.  No  asterixis on examination.  3.  Postop acute blood loss anemia. Likely there is also component of dilution. H&H daily.  Transfuse for hemoglobin less than 7. Currently hemoglobin remaining stable for at least last 48 hours.  4.  Cough. Chest x-ray negative for any acute pneumonia. Monitor for now. Aspiration precautions.  5.  Urinary retention. As needed bladder scan.  Consider Foley catheter placement if problem continues.  6.  Constipation. Bowel regimen initiated.  7.  Poor p.o. intake. Lactose intolerant. Added boost.  8.  Chest tightness.  Intermittent, atypical, reproducible EKG unremarkable. Pain present for last few days without any resolution. Chest x-ray earlier unremarkable. Continue to monitor for now.  Recommendation on discharge: To be determined   Diet: Regular diet DVT Prophylaxis: SCD, pharmacological prophylaxis contraindicated due to recent neuro surgery   Family Communication: family was present at bedside, at the time of interview. The pt provided permission to discuss medical plan with the family. Opportunity was given to ask question and all questions were answered satisfactorily.   Disposition: We will continue to follow the patient.   Other Consultants: Nephrology Procedures: none Antibiotics: Anti-infectives (From admission, onward)   Start     Dose/Rate Route Frequency Ordered Stop   08/13/20 1000  valACYclovir (VALTREX) tablet 500 mg        500 mg Oral Daily 08/12/20 2048     08/13/20 0800  methenamine (HIPREX) tablet 1 g  Status:  Discontinued       Note to Pharmacy: For UTI Prevention. Last UTI: October 20th, November 5th, and December 28th.     1 g Oral 2 times daily with meals 08/12/20 2048 08/12/20 2107   08/13/20 0800  methenamine (MANDELAMINE) tablet 1,000 mg  1,000 mg Oral 2 times daily with meals 08/12/20 2108     08/12/20 2230  vancomycin (VANCOREADY) IVPB 500 mg/100 mL        500 mg 100 mL/hr over 60 Minutes Intravenous   Once 08/12/20 2139 08/13/20 0853   08/12/20 0600  vancomycin (VANCOCIN) IVPB 1000 mg/200 mL premix        1,000 mg 200 mL/hr over 60 Minutes Intravenous On call to O.R. 08/12/20 0536 08/12/20 0910   08/12/20 0545  vancomycin (VANCOCIN) 1-5 GM/200ML-% IVPB       Note to Pharmacy: Tamsen Snider   : cabinet override      08/12/20 0545 08/12/20 1759      Objective: Physical Exam: Vitals:   08/19/20 0328 08/19/20 0800 08/19/20 1111 08/19/20 1645  BP: (!) 120/97 (!) 147/69 139/65 132/68  Pulse: 80 75 87 93  Resp: 13 18 12 13   Temp: 97.7 F (36.5 C) 98.4 F (36.9 C) 98.4 F (36.9 C) 98.8 F (37.1 C)  TempSrc: Axillary Axillary Oral Oral  SpO2: 98% 97% 96% 94%  Weight:      Height:        Intake/Output Summary (Last 24 hours) at 08/19/2020 1741 Last data filed at 08/19/2020 1625 Gross per 24 hour  Intake 133.27 ml  Output 3200 ml  Net -3066.73 ml   Filed Weights   08/12/20 0557  Weight: 60.7 kg   General: Appear in mild distress, no Rash; Oral Mucosa Clear, moist. no Abnormal Neck Mass Or lumps, Conjunctiva normal  Cardiovascular: S1 and S2 Present, aortic systolic  Murmur, Respiratory: good respiratory effort, Bilateral Air entry present and Clear to Auscultation, no Crackles, no wheezes Abdomen: Bowel Sound present, Soft and no tenderness Extremities: trace Pedal edema, no calf tenderness Neurology: alert and oriented to place and person affect flat in affect. no new focal deficit Gait not checked due to patient safety concerns   Data Reviewed: CBC: Recent Labs  Lab 08/17/20 1229 08/18/20 0552 08/19/20 0546  WBC 11.1* 11.1* 12.0*  HGB 7.8* 7.5* 7.6*  HCT 22.7* 21.7* 22.3*  MCV 96.2 95.2 97.4  PLT 264 279 742   Basic Metabolic Panel: Recent Labs  Lab 08/17/20 1229 08/17/20 1543 08/17/20 1804 08/18/20 0552 08/18/20 1337 08/18/20 1733 08/18/20 2107 08/19/20 0203 08/19/20 0546 08/19/20 1045  NA 117* 115*   < > 118*   < > 124* 124* 122* 122* 121*  K 4.7  4.7  --  4.2  --   --   --   --  3.8  --   CL 84* 83*  --  85*  --   --   --   --  87*  --   CO2 26 25  --  24  --   --   --   --  27  --   GLUCOSE 139* 103*  --  94  --   --   --   --  143*  --   BUN 8 8  --  6*  --   --   --   --  9  --   CREATININE 0.48 0.46  --  0.46  --   --   --   --  0.54  --   CALCIUM 7.6* 7.5*  --  7.5*  --   --   --   --  8.1*  --   MG  --   --   --   --   --   --   --   --  1.6*  --    < > = values in this interval not displayed.   Liver Function Tests: No results for input(s): AST, ALT, ALKPHOS, BILITOT, PROT, ALBUMIN in the last 168 hours. No results for input(s): LIPASE, AMYLASE in the last 168 hours. No results for input(s): AMMONIA in the last 168 hours. Cardiac Enzymes: No results for input(s): CKTOTAL, CKMB, CKMBINDEX, TROPONINI in the last 168 hours. BNP (last 3 results) No results for input(s): BNP in the last 8760 hours. CBG: Recent Labs  Lab 08/17/20 1117  GLUCAP 145*   No results found for this or any previous visit (from the past 240 hour(s)).  Studies: No results found.   Scheduled Meds: . vitamin C  500 mg Oral Daily  . aspirin EC  81 mg Oral Q breakfast  . B-complex with vitamin C  1 tablet Oral Daily  . calcium carbonate  1 tablet Oral Q breakfast  . Chlorhexidine Gluconate Cloth  6 each Topical Daily  . cholecalciferol  2,000 Units Oral TID  . conjugated estrogens  0.5 g Vaginal Once per day on Mon Wed Fri  . dexamethasone (DECADRON) injection  4 mg Intravenous Q6H  . lactose free nutrition  237 mL Oral TID WC  . methenamine  1,000 mg Oral BID WC  . multivitamin with minerals  1 tablet Oral Daily  . pantoprazole  40 mg Oral QHS  . polyethylene glycol  17 g Oral Daily  . senna-docusate  1 tablet Oral BID  . sodium chloride flush  3 mL Intravenous Q12H  . tamsulosin  0.4 mg Oral Daily  . urea  30 g Oral BID  . valACYclovir  500 mg Oral Daily   Continuous Infusions: . sodium chloride 250 mL (08/12/20 2311)  . methocarbamol  (ROBAXIN) IV     PRN Meds: acetaminophen **OR** acetaminophen, alum & mag hydroxide-simeth, bisacodyl, HYDROcodone-acetaminophen, HYDROmorphone (DILAUDID) injection, loperamide **AND** simethicone, menthol-cetylpyridinium **OR** phenol, methocarbamol **OR** methocarbamol (ROBAXIN) IV, ondansetron **OR** ondansetron (ZOFRAN) IV, oxyCODONE, polyvinyl alcohol, sodium chloride flush  Time spent: 35 minutes  Author: Berle Mull, MD [facility] 08/19/2020 5:41 PM  To reach On-call, see care teams to locate the attending and reach out to them via [email or web address]. If 7PM-7AM, please contact night-coverage If you still have difficulty reaching the attending provider, please page the [facility] (Director on Call) for [facility] on amion for assistance.

## 2020-08-19 NOTE — Progress Notes (Signed)
Occupational Therapy Treatment Patient Details Name: Carol Perez MRN: [id number] DOB: [birth year] Today's Date: 08/19/2020    History of present illness 80 y.o. female presents on 08/12/2020 for L5-S1 ALIF and R L2-5 anterolateral lumbar interbody fusion due to low back pain with LLE radiculopathy. PMH includes ostropenia.   OT comments  Pt progressing slow, but steady towards acute OT goals. Lethargy a limiting factor this session. 1 LOB during approach to [facility], mod A to steady. Otherwise, close min A with rw. Walked a few steps to [facility], pericare completed at mod A level in standing,  then a few steps to access recliner. Up in recliner with daughter present and chair alarm on. D/c plan remains appropriate.    Follow Up Recommendations  SNF    Equipment Recommendations  Other (comment) (defer to next venue)    Recommendations for Other Services      Precautions / Restrictions Precautions Precautions: Fall;Back Precaution Booklet Issued: Yes (comment) Precaution Comments: Reviewed precautions, poor pt recall Required Braces or Orthoses: Spinal Brace Spinal Brace: Lumbar corset;Applied in sitting position Restrictions Weight Bearing Restrictions: No       Mobility Bed Mobility Overal bed mobility: Needs Assistance Bed Mobility: Rolling;Sidelying to Sit Rolling: Min assist Sidelying to sit: Min assist       General bed mobility comments: Tactile and verbal cues for logrolling. Min A to fully powerup trunk to EOB position. Extra time and effort but able to scoot hips out for feet to be able to touch the floor.    Transfers Overall transfer level: Needs assistance Equipment used: Rolling walker (2 wheeled) Transfers: Sit to/from Stand Sit to Stand: Min assist         General transfer comment: min A to steady, cues for hand placement and technique with rw.    Balance Overall balance assessment: Needs assistance Sitting-balance support: Bilateral upper extremity  supported;Feet supported Sitting balance-Leahy Scale: Poor Sitting balance - Comments: Pt sat with continuous BUE in static sitting EOB, unable to try even single UE support.   Standing balance support: Bilateral upper extremity supported Standing balance-Leahy Scale: Poor Standing balance comment: reliant on BUE support of RW                           ADL either performed or assessed with clinical judgement   ADL Overall ADL's : Needs assistance/impaired                         Toilet Transfer: Minimal assistance;Ambulation;BSC;RW Toilet Transfer Details (indicate cue type and reason): walked pivotal steps to [facility], then to recliner.. walked total 5'. Lethargy a limiting factor. Toileting- Clothing Manipulation and Hygiene: Moderate assistance;Sit to/from stand Toileting - Clothing Manipulation Details (indicate cue type and reason): pt able to stand with rw and min guard assist, total A for pericare.     Functional mobility during ADLs: Minimal assistance;Moderate assistance;Rolling walker General ADL Comments: Pt walked pivotal steps to [facility] then to recliner. 1 LOB walking to [facility] needing mod A to steady, otherwise min A. Lethargic.     Vision       Perception     Praxis      Cognition Arousal/Alertness: Lethargic Behavior During Therapy: WFL for tasks assessed/performed Overall Cognitive Status: Impaired/Different from baseline Area of Impairment: Memory;Problem solving;Safety/judgement                     Memory: Decreased  recall of precautions;Decreased short-term memory   Safety/Judgement: Decreased awareness of safety   Problem Solving: Requires verbal cues General Comments: Eyes closed throughout most of session. Denied dizziness/lightheadedness. "I don't feel like anything." A&O person, place, time        Exercises     Shoulder Instructions       General Comments BP assessed at end of session: 140/59    Pertinent Vitals/  Pain       Pain Assessment: Faces Faces Pain Scale: Hurts a little bit Pain Location: generalized Pain Descriptors / Indicators: Grimacing;Aching Pain Intervention(s): Monitored during session  Home Living                                          Prior Functioning/Environment              Frequency  Min 2X/week        Progress Toward Goals  OT Goals(current goals can now be found in the care plan section)  Progress towards OT goals: Progressing toward goals  Acute Rehab OT Goals Patient Stated Goal: to return to independent mobility OT Goal Formulation: With patient/family Time For Goal Achievement: 08/27/20 Potential to Achieve Goals: Good ADL Goals Pt Will Perform Grooming: with modified independence;standing;sitting Pt Will Perform Lower Body Bathing: with modified independence;sit to/from stand Pt Will Perform Lower Body Dressing: with modified independence;sit to/from stand Pt Will Transfer to Toilet: with modified independence;ambulating Pt Will Perform Toileting - Clothing Manipulation and hygiene: with modified independence;sit to/from stand;with adaptive equipment Additional ADL Goal #1: Pt will complete bed mobility at supervision level to prepare for EOB/OOB ADLs.  Plan Discharge plan remains appropriate    Co-evaluation                 AM-PAC OT "6 Clicks" Daily Activity     Outcome Measure   Help from another person eating meals?: None Help from another person taking care of personal grooming?: A Little Help from another person toileting, which includes using toliet, bedpan, or urinal?: A Lot Help from another person bathing (including washing, rinsing, drying)?: A Lot Help from another person to put on and taking off regular upper body clothing?: A Little Help from another person to put on and taking off regular lower body clothing?: A Lot 6 Click Score: 16    End of Session Equipment Utilized During Treatment: Rolling  walker;Back brace  OT Visit Diagnosis: Unsteadiness on feet (R26.81);Pain;Muscle weakness (generalized) (M62.81)   Activity Tolerance Patient limited by lethargy   Patient Left in chair;with call bell/phone within reach;with chair alarm set;with family/visitor present (recliner tilted)   Nurse Communication          Time: 4944-9675 OT Time Calculation (min): 45 min  Charges: OT General Charges $OT Visit: 1 Visit OT Treatments $Self Care/Home Management : 38-52 mins  Tyrone Schimke, OT Acute Rehabilitation Services Pager: [phone] Office: [phone]    Hortencia Pilar 08/19/2020, 10:20 AM

## 2020-08-19 NOTE — Progress Notes (Signed)
Physical Therapy Treatment Patient Details Name: Carol Perez MRN: [id number] DOB: 16-Aug-1940 Today's Date: 08/19/2020    History of Present Illness 80 y.o. female presents on 08/12/2020 for L5-S1 ALIF and R L2-5 anterolateral lumbar interbody fusion due to low back pain with LLE radiculopathy. PMH includes ostropenia.    PT Comments    Pt did ambulate back to bed with a RW and min guard this date, but remains limited in progressing mobility distance due to reports of nausea and dizziness upon arrival. See General Comments below in regards to her changes in BP with position changes. MD and RN made aware. Pt very agitated this session due to feeling unwell. She continues to require minA for bed mobility and transfers at this time with limited tolerance to activity. Will continue to follow acutely. Current recommendations remain appropriate.    Follow Up Recommendations  SNF (per pt discharge plan is for short term rehab at [place])     Equipment Recommendations  Rolling walker with 5" wheels    Recommendations for Other Services       Precautions / Restrictions Precautions Precautions: Fall;Back Precaution Booklet Issued: Yes (comment) Precaution Comments: Orthostatic hypotension; Reviewed precautions Required Braces or Orthoses: Spinal Brace Spinal Brace: Lumbar corset;Applied in sitting position Restrictions Weight Bearing Restrictions: No    Mobility  Bed Mobility Overal bed mobility: Needs Assistance Bed Mobility: Rolling;Sit to Sidelying Rolling: Min guard Sidelying to sit: Min assist     Sit to sidelying: Min assist General bed mobility comments: MinA to manage legs back into bed, cuing pt to lean laterally on R elbow to maintain spinal precautions. Min guard to roll back onto back.    Transfers Overall transfer level: Needs assistance Equipment used: Rolling walker (2 wheeled) Transfers: Sit to/from Stand Sit to Stand: Min assist         General  transfer comment: min A to steady, cues for hand placement and technique with RW.  Ambulation/Gait Ambulation/Gait assistance: Min guard Gait Distance (Feet): 10 Feet Assistive device: Rolling walker (2 wheeled) Gait Pattern/deviations: Step-through pattern;Decreased stride length;Trunk flexed Gait velocity: reduced Gait velocity interpretation: <1.31 ft/sec, indicative of household ambulator General Gait Details: Min guard for safety and to assist with managing lines, cues for sequencing turn, slow pace   Stairs             Wheelchair Mobility    Modified Rankin (Stroke Patients Only)       Balance Overall balance assessment: Needs assistance Sitting-balance support: Bilateral upper extremity supported;Feet supported Sitting balance-Leahy Scale: Poor Sitting balance - Comments: Pt sat with continuous bil UE in static sitting EOB.   Standing balance support: Bilateral upper extremity supported Standing balance-Leahy Scale: Poor Standing balance comment: reliant on bil UE support of RW                            Cognition Arousal/Alertness: Lethargic Behavior During Therapy: Agitated;Restless Overall Cognitive Status: Impaired/Different from baseline Area of Impairment: Memory;Problem solving;Safety/judgement                     Memory: Decreased recall of precautions;Decreased short-term memory   Safety/Judgement: Decreased awareness of safety   Problem Solving: Requires verbal cues;Difficulty sequencing General Comments: Pt agitated and yelling about desire to die upon arrival due to her not feeling well. Pt able to be calmed with meeting her desires during session, but she continued to display fatigue and nausea  and dizziness when upright. Quickly closed eyes to nap upon return to supine.      Exercises      General Comments General comments (skin integrity, edema, etc.): BP per monitor 1 hour prior to session 140/59; BP upon arrival  sitting in recliner 111/64; BPsitting up following gait to transfer to bed 99/62; BP after several minutes supine in bed end of session 139/65; Pt reports nausea and dizziness with upright postures; MD and RN made aware      Pertinent Vitals/Pain Pain Assessment: Faces Faces Pain Scale: Hurts a little bit Pain Location: generalized Pain Descriptors / Indicators: Grimacing;Aching Pain Intervention(s): Limited activity within patient's tolerance;Monitored during session;Repositioned    Home Living                      Prior Function            PT Goals (current goals can now be found in the care plan section) Acute Rehab PT Goals Patient Stated Goal: to feel better PT Goal Formulation: With patient Time For Goal Achievement: 08/27/20 Potential to Achieve Goals: Good Progress towards PT goals: Progressing toward goals (slowly, limited by BP changes)    Frequency    Min 5X/week      PT Plan Current plan remains appropriate    Co-evaluation              AM-PAC PT "6 Clicks" Mobility   Outcome Measure  Help needed turning from your back to your side while in a flat bed without using bedrails?: None Help needed moving from lying on your back to sitting on the side of a flat bed without using bedrails?: A Little Help needed moving to and from a bed to a chair (including a wheelchair)?: A Little Help needed standing up from a chair using your arms (e.g., wheelchair or bedside chair)?: A Little Help needed to walk in hospital room?: A Little Help needed climbing 3-5 steps with a railing? : A Lot 6 Click Score: 18    End of Session Equipment Utilized During Treatment: Back brace;Gait belt Activity Tolerance: Patient limited by fatigue;Treatment limited secondary to medical complications (Comment) (not feeling well) Patient left: in bed;with call bell/phone within reach;with bed alarm set Nurse Communication: Mobility status;Other (comment) (BP and symptom  changes with position) PT Visit Diagnosis: Other abnormalities of gait and mobility (R26.89);Muscle weakness (generalized) (M62.81);Pain;Unsteadiness on feet (R26.81);Difficulty in walking, not elsewhere classified (R26.2) Pain - part of body:  (back)     Time: 6222-9798 PT Time Calculation (min) (ACUTE ONLY): 21 min  Charges:  $Therapeutic Activity: 8-22 mins                     Moishe Spice, PT, DPT Acute Rehabilitation Services  Pager: [phone] Office: [place] 08/19/2020, 11:26 AM

## 2020-08-19 NOTE — Progress Notes (Signed)
   Providing Compassionate, Quality Care - Together   Subjective: Patient reports incisional and psoas pain. States pain improves when she is walking around but has difficulty standing up.   Objective: Vital signs in last 24 hours: Temp:  [97.7 F (36.5 C)-98.4 F (36.9 C)] 98.4 F (36.9 C) (03/10 0800) Pulse Rate:  [73-106] 75 (03/10 0800) Resp:  [9-19] 18 (03/10 0800) BP: (120-149)/(54-97) 147/69 (03/10 0800) SpO2:  [94 %-98 %] 97 % (03/10 0800)  Intake/Output from previous day: 03/09 0701 - 03/10 0700 In: 505.4 [I.V.:505.4] Out: 2650 [Urine:2650] Intake/Output this shift: No intake/output data recorded.  Pt is more alert today, sitting in chair with daughter and husband at bedside.  PERRLA CN II-XII grossly intact MAE, Strength and sensation intact Incision is covered with Honeycomb dressing and Steri Strips; Dressing is clean, dry, and intact  Lab Results: Recent Labs    08/18/20 0552 08/19/20 0546  WBC 11.1* 12.0*  HGB 7.5* 7.6*  HCT 21.7* 22.3*  PLT 279 346   BMET Recent Labs    08/18/20 0552 08/18/20 1337 08/19/20 0203 08/19/20 0546  NA 118*   < > 122* 122*  K 4.2  --   --  3.8  CL 85*  --   --  87*  CO2 24  --   --  27  GLUCOSE 94  --   --  143*  BUN 6*  --   --  9  CREATININE 0.46  --   --  0.54  CALCIUM 7.5*  --   --  8.1*   < > = values in this interval not displayed.    Studies/Results: No results found.  Assessment/Plan: Patient is seven days s/pL5-S1 ALIF with right L2-L5 XLIF with percutaneous pedicle screws at L2-S1. Shehas beenrecovering well and reports a resolution of her preoperative symptoms.Shehas beenhaving some issues with pain controlShe has worked with [facility]. Continue TLSO brace when OOB. Patient has discussed and planned to do re[facility] at [facility] developed increased somnolence on 08/17/2020. Lab work revealed sodium level of 117. The Hospitalists were consulted for further  assistance with medical management. 3% off last night.    LOS: 7 days   -Added decadron 4mg  Q6hrs x 2 days to help with pain management. -SNF placement on hold until patient is medically stable. -Mobilize as tolerated when patient's mental status improves   Viona Gilmore, DNP, AGNP-C Nurse Practitioner  [facility] [place]. [address], [address], [place], [place] [address] P: [phone]    F: [phone]  08/19/2020, 10:41 AM

## 2020-08-19 NOTE — Progress Notes (Signed)
Nephrology Follow-Up Consult note   Assessment/Recommendations: Carol Perez is a/an 80 y.o. female with a past medical history significant for recurrent UTI, lymphocytic colitis, back pain, admitted for back surgery related to lumbar stenosis with radiculopathy complicated by hyponatremia.     Severe symptomatic hyponatremia; SIADH: Hyponatremia remains severe but improving.  Urine studies most consistent with SIADH.  Trying to transition to oral regimen -Increase urea to 30 g twice daily -Consider restarting hypertonic saline if sodium falls below 120 -Could consider a dose of tolvaptan if needed -Continue to monitor sodium every 6 hours -Goal correction will be 128-130 in 24 hours (from this AM) -Restrict fluids to 1.5 L daily -Pain control per primary team -One-time IV Lasix 20 mg to help with loss of free water again today  Encephalopathy: Likely related to hyponatremia but also multifactorial.  Treatment of hyponatremia as above.   Anemia: Also likely multifactorial.  Transfusion per primary team.  No indication for ESA  Shortness of breath/concern for pneumonia: Management per primary  Possible urinary retention: Difficulty voiding per daughter.  Fortunately no signs of urinary retention over the past 24 hours  Recommendations conveyed to primary service.    [facility] 08/19/2020 1:03 PM  ___________________________________________________________  CC: Hyponatremia  Interval History/Subjective: Sodium improved to 124 but most recently back down to 121.  Off of hypertonic saline since last night.  Started on urea.  This morning he received first dose.  Daughter at bedside feels like patient is somewhat improved but continues to be slightly confused.  Responded well to Lasix   Medications:  Current Facility-Administered Medications  Medication Dose Route Frequency Provider Last Rate Last Admin  . 0.9 %  sodium chloride infusion  250 mL  Intravenous Continuous Erline Levine, MD 1 mL/hr at 08/12/20 2311 250 mL at 08/12/20 2311  . acetaminophen (TYLENOL) tablet 650 mg  650 mg Oral Q4H PRN Erline Levine, MD       Or  . acetaminophen (TYLENOL) suppository 650 mg  650 mg Rectal Q4H PRN Erline Levine, MD      . alum & mag hydroxide-simeth (MAALOX/MYLANTA) 200-200-20 MG/5ML suspension 30 mL  30 mL Oral Q6H PRN Erline Levine, MD   30 mL at 08/15/20 0956  . ascorbic acid (VITAMIN C) tablet 500 mg  500 mg Oral Daily Erline Levine, MD   500 mg at 08/19/20 0912  . aspirin EC tablet 81 mg  81 mg Oral Q breakfast Erline Levine, MD   81 mg at 08/19/20 0913  . B-complex with vitamin C tablet 1 tablet  1 tablet Oral Daily Erline Levine, MD   1 tablet at 08/19/20 0912  . bisacodyl (DULCOLAX) suppository 10 mg  10 mg Rectal Daily PRN Erline Levine, MD   10 mg at 08/14/20 0249  . calcium carbonate (OS-CAL - dosed in mg of elemental calcium) tablet 500 mg of elemental calcium  1 tablet Oral Q breakfast Erline Levine, MD   500 mg of elemental calcium at 08/19/20 0913  . Chlorhexidine Gluconate Cloth 2 % PADS 6 each  6 each Topical Daily Erline Levine, MD   6 each at 08/19/20 0919  . cholecalciferol (VITAMIN D3) tablet 2,000 Units  2,000 Units Oral TID Erline Levine, MD   2,000 Units at 08/19/20 0913  . conjugated estrogens (PREMARIN) vaginal cream 0.16 Applicatorful  0.5 g Vaginal Once per day on Mon Wed Fri Erline Levine, MD   0.10 Applicatorful at 93/23/55 0912  . dexamethasone (DECADRON)  injection 4 mg  4 mg Intravenous Q6H Bergman, Meghan D, NP   4 mg at 08/19/20 1228  . HYDROcodone-acetaminophen (NORCO) 10-325 MG per tablet 1-2 tablet  1-2 tablet Oral Q4H PRN Rise Patience, MD   2 tablet at 08/19/20 [phone]  . HYDROmorphone (DILAUDID) injection 0.5 mg  0.5 mg Intravenous Q2H PRN Erline Levine, MD   0.5 mg at 08/17/20 1713  . lactose free nutrition (BOOST PLUS) liquid 237 mL  237 mL Oral TID WC Lavina Hamman, MD   237 mL at 08/19/20 1228  .  loperamide (IMODIUM) capsule 2 mg  2 mg Oral QID PRN Erline Levine, MD       And  . simethicone [facility]) chewable tablet 160 mg  160 mg Oral QID PRN Erline Levine, MD      . menthol-cetylpyridinium (CEPACOL) lozenge 3 mg  1 lozenge Oral PRN Erline Levine, MD       Or  . phenol (CHLORASEPTIC) mouth spray 1 spray  1 spray Mouth/Throat PRN Erline Levine, MD      . methenamine (MANDELAMINE) tablet 1,000 mg  1,000 mg Oral BID WC Erline Levine, MD   1,000 mg at 08/19/20 0912  . methocarbamol (ROBAXIN) tablet 500 mg  500 mg Oral Q6H PRN Erline Levine, MD   500 mg at 08/18/20 5188   Or  . methocarbamol (ROBAXIN) 500 mg in dextrose 5 % 50 mL IVPB  500 mg Intravenous Q6H PRN Erline Levine, MD      . multivitamin with minerals tablet 1 tablet  1 tablet Oral Daily Erline Levine, MD   1 tablet at 08/19/20 0913  . ondansetron (ZOFRAN) tablet 4 mg  4 mg Oral Q6H PRN Erline Levine, MD       Or  . ondansetron [facility]) injection 4 mg  4 mg Intravenous Q6H PRN Erline Levine, MD   4 mg at 08/15/20 0035  . oxyCODONE (Oxy IR/ROXICODONE) immediate release tablet 5 mg  5 mg Oral Q3H PRN Erline Levine, MD   5 mg at 08/19/20 1233  . pantoprazole (PROTONIX) EC tablet 40 mg  40 mg Oral QHS von Dohlen, Haley B, RPH   40 mg at 08/18/20 2346  . polyethylene glycol (MIRALAX / GLYCOLAX) packet 17 g  17 g Oral Daily Lavina Hamman, MD   17 g at 08/18/20 1255  . polyvinyl alcohol (LIQUIFILM TEARS) 1.4 % ophthalmic solution 1 drop  1 drop Both Eyes PRN Erline Levine, MD      . senna-docusate (Senokot-S) tablet 1 tablet  1 tablet Oral BID Lavina Hamman, MD   1 tablet at 08/18/20 1256  . sodium chloride flush (NS) 0.9 % injection 3 mL  3 mL Intravenous Q12H Erline Levine, MD   3 mL at 08/19/20 1012  . sodium chloride flush (NS) 0.9 % injection 3 mL  3 mL Intravenous PRN Erline Levine, MD      . tamsulosin [facility]) capsule 0.4 mg  0.4 mg Oral Daily Erline Levine, MD   0.4 mg at 08/19/20 0913  . urea (URE-NA) oral packet 30 g  30  g Oral BID Reesa Chew, MD      . valACYclovir Est[facility] Harpin) tablet 500 mg  500 mg Oral Daily Erline Levine, MD   500 mg at 08/19/20 0913      Review of Systems: 10 systems reviewed and negative except per interval history/subjective  Physical Exam: Vitals:   08/19/20 0800 08/19/20 1111  BP: (!) 147/69 139/65  Pulse: 75 87  Resp: 18 12  Temp: 98.4 F (36.9 C) 98.4 F (36.9 C)  SpO2: 97% 96%   No intake/output data recorded.  Intake/Output Summary (Last 24 hours) at 08/19/2020 1303 Last data filed at 08/18/2020 1826 Gross per 24 hour  Intake 505.44 ml  Output 1850 ml  Net -1344.56 ml   Constitutional: Standing next to bed, working with physical therapy ENMT: ears and nose without scars or lesions, MMM CV: normal rate, no edema Respiratory: Bilateral chest rise, normal work of breathing Gastrointestinal: soft, non-tender, no palpable masses or hernias Skin: no visible lesions or rashes Psych: Awake, alert, interactive   Test Results I personally reviewed new and old clinical labs and radiology tests Lab Results  Component Value Date   NA 121 (L) 08/19/2020   K 3.8 08/19/2020   CL 87 (L) 08/19/2020   CO2 27 08/19/2020   BUN 9 08/19/2020   CREATININE 0.54 08/19/2020   CALCIUM 8.1 (L) 08/19/2020

## 2020-08-19 NOTE — Progress Notes (Signed)
This RN was in pt's room giving medications when she voided with the purewick on. 900 mL clear, yellow urine was emptied since last time it was emptied this am at beginning of shift. Pt was bladder scanned for post-void residual and first scan showed 665 mL, second scan showed 671 mL. Foley catheter was placed per order to place foley for POV >350. Dr. Joylene Grapes was messaged via secure chat with updated and acknowledged receiving message. 600 mL was visualized in foley bag and was reported to Dr. Joylene Grapes and asked if he wanted to clamp before getting to 1L. He gave order to let it drain out for now.  Justice Rocher, RN

## 2020-08-20 DIAGNOSIS — E871 Hypo-osmolality and hyponatremia: Secondary | ICD-10-CM | POA: Diagnosis not present

## 2020-08-20 LAB — BASIC METABOLIC PANEL
Anion gap: 8 (ref 5–15)
BUN: 16 mg/dL (ref 8–23)
CO2: 29 mmol/L (ref 22–32)
Calcium: 9 mg/dL (ref 8.9–10.3)
Chloride: 90 mmol/L — ABNORMAL LOW (ref 98–111)
Creatinine, Ser: 0.59 mg/dL (ref 0.44–1.00)
GFR, Estimated: >60 mL/min (ref >60)
Glucose, Bld: 144 mg/dL — ABNORMAL HIGH (ref 70–99)
Potassium: 4.2 mmol/L (ref 3.5–5.1)
Sodium: 127 mmol/L — ABNORMAL LOW (ref 135–145)

## 2020-08-20 LAB — CBC
HCT: 22.5 % — ABNORMAL LOW (ref 36.0–46.0)
Hemoglobin: 8.1 g/dL — ABNORMAL LOW (ref 12.0–15.0)
MCH: 34 pg (ref 26.0–34.0)
MCHC: 36 g/dL (ref 30.0–36.0)
MCV: 94.5 fL (ref 80.0–100.0)
Platelets: 412 10*3/uL — ABNORMAL HIGH (ref 150–400)
RBC: 2.38 MIL/uL — ABNORMAL LOW (ref 3.87–5.11)
RDW: 12.7 % (ref 11.5–15.5)
WBC: 15.2 10*3/uL — ABNORMAL HIGH (ref 4.0–10.5)
nRBC: 0.5 % — ABNORMAL HIGH (ref 0.0–0.2)

## 2020-08-20 LAB — MAGNESIUM: Magnesium: 2 mg/dL (ref 1.7–2.4)

## 2020-08-20 LAB — OSMOLALITY, URINE: Osmolality, Ur: 445 mOsm/kg (ref 300–900)

## 2020-08-20 LAB — SODIUM, URINE, RANDOM: Sodium, Ur: 81 mmol/L

## 2020-08-20 LAB — SODIUM: Sodium: 132 mmol/L — ABNORMAL LOW (ref 135–145)

## 2020-08-20 NOTE — Progress Notes (Signed)
[facility] Consultation Progress Note  Patient: Carol Perez XHB:[id number]   PCP: Virgie Dad, MD DOB: 1941-03-19   DOA: 08/12/2020   DOS: 08/20/2020   Date of Service: the patient was seen and examined on 08/20/2020 Primary service: Erline Levine, MD    Brief hospital course: Pt. with PMH of recurrent UTI on prophylaxis, lymphocytic colitis, chronic lumbago; admitted on 08/12/2020, with complaint of worsening back pain admitted for lumbar surgery.underwent elective lumbar spine fusion 2-3, 3-4, 4-5.  Postoperatively patient had poor p.o. intake and started having worsening sodium level with hyponatremia.  Also had worsening mental status change as well as anemia and therefore hospitalist was consulted.  Nephrology also consulted.  Currently further plan is monitor sodium level and treatment under nephrology guidance, monitor H&H and transfuse for hemoglobin less than 7, monitor mental status and adjust meds appropriately.  Subjective: No nausea no vomiting.  No fever no chills.  Pain well controlled.  No diarrhea.  No constipation.  No bleeding.  Assessment and Plan: 1.  SIADH Severe symptomatic hyponatremia. Management per nephrology. Appears also volume overloaded. Patient was receiving half-normal saline which can contribute as well. On trazodone and gabapentin which can also contribute to SIADH. Urine sodium 79, osmolality 427.  Serum osmolality 240. Started on hypertonic saline 3% on 3/8. Sodium correcting appropriately. Received intermittent Lasix. Neuro checks every 4 hours, sodium level check every 4 hours.  2.  Acute metabolic encephalopathy-resolved Mentation appears to be baseline. In the setting of severe hyponatremia. As well as polypharmacy in hospital induced delirium. Minimizing narcotic medication. Discontinue gabapentin and trazodone. Currently no focal deficit on examination.  No asterixis on examination.  3.  Postop acute blood loss anemia. Likely there  is also component of dilution. H&H daily.  Transfuse for hemoglobin less than 7. Currently hemoglobin remaining stable for at least last 48 hours.  4.  Cough. Chest x-ray negative for any acute pneumonia. Monitor for now. Aspiration precautions.  5.  Urinary retention. As needed bladder scan.  Consider Foley catheter placement if problem continues.  6.  Constipation. Bowel regimen initiated.  7.  Poor p.o. intake. Lactose intolerant. Added boost.  8.  Chest tightness.  Intermittent, atypical, reproducible EKG unremarkable. Pain present for last few days without any resolution. Chest x-ray earlier unremarkable. Continue to monitor for now.  Recommendation on discharge: To be determined   Diet: Regular diet DVT Prophylaxis: SCD, pharmacological prophylaxis contraindicated due to recent neuro surgery   Family Communication: family was present at bedside, at the time of interview. The pt provided permission to discuss medical plan with the family. Opportunity was given to ask question and all questions were answered satisfactorily.   Disposition: We will continue to follow the patient.   Other Consultants: Nephrology Procedures: none Antibiotics: Anti-infectives (From admission, onward)   Start     Dose/Rate Route Frequency Ordered Stop   08/13/20 1000  valACYclovir (VALTREX) tablet 500 mg  Status:  Discontinued        500 mg Oral Daily 08/12/20 2048 08/20/20 0751   08/13/20 0800  methenamine (HIPREX) tablet 1 g  Status:  Discontinued       Note to Pharmacy: For UTI Prevention. Last UTI: October 20th, November 5th, and December 28th.     1 g Oral 2 times daily with meals 08/12/20 2048 08/12/20 2107   08/13/20 0800  methenamine (MANDELAMINE) tablet 1,000 mg        1,000 mg Oral 2 times daily with meals 08/12/20 2108  08/12/20 2230  vancomycin (VANCOREADY) IVPB 500 mg/100 mL        500 mg 100 mL/hr over 60 Minutes Intravenous  Once 08/12/20 2139 08/13/20 0853    08/12/20 0600  vancomycin (VANCOCIN) IVPB 1000 mg/200 mL premix        1,000 mg 200 mL/hr over 60 Minutes Intravenous On call to O.R. 08/12/20 0536 08/12/20 0910   08/12/20 0545  vancomycin (VANCOCIN) 1-5 GM/200ML-% IVPB       Note to Pharmacy: Tamsen Snider   : cabinet override      08/12/20 0545 08/12/20 1759      Objective: Physical Exam: Vitals:   08/20/20 0438 08/20/20 0800 08/20/20 1101 08/20/20 1610  BP: (!) 144/69 (!) 141/67 (!) 142/62 140/63  Pulse: 94 97 94 97  Resp: 13 13  16   Temp: 98.8 F (37.1 C) 98.9 F (37.2 C) 98.6 F (37 C) 98.1 F (36.7 C)  TempSrc: Oral Oral Oral Oral  SpO2: 97% 96% 97% 99%  Weight:      Height:        Intake/Output Summary (Last 24 hours) at 08/20/2020 1910 Last data filed at 08/20/2020 1846 Gross per 24 hour  Intake 1074 ml  Output 5950 ml  Net -4876 ml   Filed Weights   08/12/20 0557  Weight: 60.7 kg   General: Appear in mild distress, no Rash; Oral Mucosa Clear, moist. no Abnormal Neck Mass Or lumps, Conjunctiva normal  Cardiovascular: S1 and S2 Present, no Murmur, Respiratory: good respiratory effort, Bilateral Air entry present and CTA, no Crackles, no wheezes Abdomen: Bowel Sound present, Soft and no tenderness Extremities: no Pedal edema Neurology: alert and oriented to time, place, and person affect appropriate. no new focal deficit Gait not checked due to patient safety concerns  Data Reviewed: CBC: Recent Labs  Lab 08/17/20 1229 08/18/20 0552 08/19/20 0546 08/20/20 0434  WBC 11.1* 11.1* 12.0* 15.2*  HGB 7.8* 7.5* 7.6* 8.1*  HCT 22.7* 21.7* 22.3* 22.5*  MCV 96.2 95.2 97.4 94.5  PLT 264 279 346 419*   Basic Metabolic Panel: Recent Labs  Lab 08/17/20 1229 08/17/20 1543 08/17/20 1804 08/18/20 0552 08/18/20 1337 08/19/20 0546 08/19/20 1045 08/19/20 1644 08/19/20 2239 08/20/20 0434 08/20/20 1410  NA 117* 115*   < > 118*   < > 122* 121* 122* 125* 127* 132*  K 4.7 4.7  --  4.2  --  3.8  --   --   --   4.2  --   CL 84* 83*  --  85*  --  87*  --   --   --  90*  --   CO2 26 25  --  24  --  27  --   --   --  29  --   GLUCOSE 139* 103*  --  94  --  143*  --   --   --  144*  --   BUN 8 8  --  6*  --  9  --   --   --  16  --   CREATININE 0.48 0.46  --  0.46  --  0.54  --   --   --  0.59  --   CALCIUM 7.6* 7.5*  --  7.5*  --  8.1*  --   --   --  9.0  --   MG  --   --   --   --   --  1.6*  --   --   --  2.0  --    < > = values in this interval not displayed.   Liver Function Tests: No results for input(s): AST, ALT, ALKPHOS, BILITOT, PROT, ALBUMIN in the last 168 hours. No results for input(s): LIPASE, AMYLASE in the last 168 hours. No results for input(s): AMMONIA in the last 168 hours. Cardiac Enzymes: No results for input(s): CKTOTAL, CKMB, CKMBINDEX, TROPONINI in the last 168 hours. BNP (last 3 results) No results for input(s): BNP in the last 8760 hours. CBG: Recent Labs  Lab 08/17/20 1117  GLUCAP 145*   No results found for this or any previous visit (from the past 240 hour(s)).  Studies: No results found.   Scheduled Meds: . aspirin EC  81 mg Oral Q breakfast  . Chlorhexidine Gluconate Cloth  6 each Topical Daily  . conjugated estrogens  0.5 g Vaginal Once per day on Mon Wed Fri  . dexamethasone (DECADRON) injection  4 mg Intravenous Q6H  . lactose free nutrition  237 mL Oral TID WC  . methenamine  1,000 mg Oral BID WC  . multivitamin with minerals  1 tablet Oral Daily  . pantoprazole  40 mg Oral QHS  . polyethylene glycol  17 g Oral Daily  . senna-docusate  1 tablet Oral BID  . sodium chloride flush  3 mL Intravenous Q12H  . tamsulosin  0.4 mg Oral Daily   Continuous Infusions: . sodium chloride 250 mL (08/12/20 2311)  . methocarbamol (ROBAXIN) IV     PRN Meds: acetaminophen **OR** acetaminophen, alum & mag hydroxide-simeth, bisacodyl, HYDROcodone-acetaminophen, HYDROmorphone (DILAUDID) injection, menthol-cetylpyridinium **OR** phenol, methocarbamol **OR**  methocarbamol (ROBAXIN) IV, ondansetron **OR** ondansetron (ZOFRAN) IV, oxyCODONE, polyvinyl alcohol, sodium chloride flush  Time spent: 35 minutes  Author: Berle Mull, MD [facility] 08/20/2020 7:10 PM  To reach On-call, see care teams to locate the attending and reach out to them via [email or web address]. If 7PM-7AM, please contact night-coverage If you still have difficulty reaching the attending provider, please page the [facility] (Director on Call) for [facility] on amion for assistance.

## 2020-08-20 NOTE — Plan of Care (Signed)

## 2020-08-20 NOTE — Progress Notes (Addendum)
Physical Therapy Treatment Patient Details Name: Carol Perez MRN: [id number] DOB: [birth year] Today's Date: 08/20/2020    History of Present Illness 80 y.o. female presents on 08/12/2020 for L5-S1 ALIF and R L2-5 anterolateral lumbar interbody fusion due to low back pain with LLE radiculopathy. PMH includes ostropenia.    PT Comments    Pt feeling better this date, but continues to be limited by symptomatic decline in BP with changes in position. RN notified. Pt educated on reclining to alleviate symptoms, with noted success this date. See General Comments below in regards to BP changes. Pt able to tolerate short household distance mobility within the room with min guard and a RW this date, focusing on increasing bil step length and feet clearance with heel strike upon initial contact. Pt needing increased time and minA to transition to sit EOB and come to stand from EOB due to pain and weakness. Pt educated on precautions and regularly getting up and ambulating with nursing staff as available to advance mobility. Will continue to follow acutely. Current recommendations remain appropriate.   Follow Up Recommendations  SNF (per pt discharge plan is for short term rehab at [place])     Equipment Recommendations  Rolling walker with 5" wheels    Recommendations for Other Services       Precautions / Restrictions Precautions Precautions: Fall;Back Precaution Booklet Issued: Yes (comment) Precaution Comments: Orthostatic hypotension; Reviewed precautions, cues to recall 3/3 Required Braces or Orthoses: Spinal Brace Spinal Brace: Lumbar corset;Applied in sitting position Restrictions Weight Bearing Restrictions: No    Mobility  Bed Mobility Overal bed mobility: Needs Assistance Bed Mobility: Rolling;Sit to Sidelying Rolling: Min guard Sidelying to sit: Min assist       General bed mobility comments: Cues to utilize leg to push foot on bed and reach ipsilateral UE across  body to grab contralateral rail to log roll in bed, min guard. MinA to initiate ascension of trunk and obtaining elbow > hand on bed to complete. Cues to let legs fall off bed when trunk ascends, success.    Transfers Overall transfer level: Needs assistance Equipment used: Rolling walker (2 wheeled) Transfers: Sit to/from Stand Sit to Stand: Min assist         General transfer comment: min A to steady and power up to stand, cues for hand placement and technique with RW.  Ambulation/Gait Ambulation/Gait assistance: Min guard Gait Distance (Feet): 40 Feet Assistive device: Rolling walker (2 wheeled) Gait Pattern/deviations: Step-through pattern;Decreased stride length;Trunk flexed Gait velocity: reduced Gait velocity interpretation: <1.31 ft/sec, indicative of household ambulator General Gait Details: Min guard for safety and to assist with managing lines, cues for sequencing turn, slow pace. Cues to increase bil step length with heel strike, mod success.   Stairs             Wheelchair Mobility    Modified Rankin (Stroke Patients Only)       Balance Overall balance assessment: Needs assistance Sitting-balance support: Feet supported;No upper extremity supported Sitting balance-Leahy Scale: Fair Sitting balance - Comments: Pt able to reach min off BOS to assist with donning brace EOB, no LOB. Min guard for safety.   Standing balance support: Bilateral upper extremity supported Standing balance-Leahy Scale: Poor Standing balance comment: reliant on bil UE support of RW                            Cognition Arousal/Alertness: Awake/alert Behavior During Therapy:  WFL for tasks assessed/performed Overall Cognitive Status: Impaired/Different from baseline Area of Impairment: Memory;Problem solving;Safety/judgement                     Memory: Decreased recall of precautions;Decreased short-term memory   Safety/Judgement: Decreased awareness of  safety   Problem Solving: Requires verbal cues;Difficulty sequencing General Comments: Pt requiring cues and reminders to recall spinal precautions and maintain during session. Pt needing repeated education on possible cause of her symptoms of nausea etc.      Exercises General Exercises - Lower Extremity Long Arc Quad: Strengthening;Both;10 reps;Seated Straight Leg Raises:  (unable to tolerate with attempt)    General Comments General comments (skin integrity, edema, etc.): BP throughout session: 138/60 supine, 148/65 sitting EOB, 136/59 standing after ambulating, 145/69 after several minutes reclined in chair; Pt reports nausea with changes in position      Pertinent Vitals/Pain Pain Assessment: 0-10 Pain Score: 6  Pain Location: back, surgical site, hips Pain Descriptors / Indicators: Grimacing;Aching;Operative site guarding;Guarding;Discomfort Pain Intervention(s): Limited activity within patient's tolerance;Monitored during session;Repositioned;Premedicated before session    Home Living                      Prior Function            PT Goals (current goals can now be found in the care plan section) Acute Rehab PT Goals Patient Stated Goal: to improve PT Goal Formulation: With patient Time For Goal Achievement: 08/27/20 Potential to Achieve Goals: Good Progress towards PT goals: Progressing toward goals    Frequency    Min 5X/week      PT Plan Current plan remains appropriate    Co-evaluation              AM-PAC PT "6 Clicks" Mobility   Outcome Measure  Help needed turning from your back to your side while in a flat bed without using bedrails?: None Help needed moving from lying on your back to sitting on the side of a flat bed without using bedrails?: A Little Help needed moving to and from a bed to a chair (including a wheelchair)?: A Little Help needed standing up from a chair using your arms (e.g., wheelchair or bedside chair)?: A  Little Help needed to walk in hospital room?: A Little Help needed climbing 3-5 steps with a railing? : A Lot 6 Click Score: 18    End of Session Equipment Utilized During Treatment: Back brace;Gait belt Activity Tolerance: Treatment limited secondary to medical complications (Comment) (symptomatic changes in BP) Patient left: with call bell/phone within reach;in chair;with chair alarm set;with family/visitor present Nurse Communication: Mobility status;Other (comment) (BP and symptom changes with position) PT Visit Diagnosis: Other abnormalities of gait and mobility (R26.89);Muscle weakness (generalized) (M62.81);Pain;Unsteadiness on feet (R26.81);Difficulty in walking, not elsewhere classified (R26.2) Pain - part of body:  (back)     Time: 4401-0272 PT Time Calculation (min) (ACUTE ONLY): 31 min  Charges:  $Gait Training: 8-22 mins $Therapeutic Activity: 8-22 mins                     Moishe Spice, PT, DPT Acute Rehabilitation Services  Pager: [phone] Office: [place] 08/20/2020, 10:47 AM

## 2020-08-20 NOTE — Progress Notes (Signed)
Nephrology Follow-Up Consult note   Assessment/Recommendations: Carol Perez is a/an 80 y.o. female with a past medical history significant for recurrent UTI, lymphocytic colitis, back pain, admitted for back surgery related to lumbar stenosis with radiculopathy complicated by hyponatremia.     Severe symptomatic hyponatremia; SIADH: Significantly improved to 127 today.  Symptoms related to hyponatremia now resolved.  Her SIADH is likely resolving with control of her pain -Continue urea 30 g twice daily; wean as able -Goal sodium normal 133-135 by tomorrow morning -Monitor sodium twice daily -Restrict fluids to 1.5 L daily; likely can liberalize tomorrow -Pain control per primary team -No Lasix today  Encephalopathy: Likely related to hyponatremia but also multifactorial.  Treatment of hyponatremia as above.  Significantly improved  Anemia: Also likely multifactorial.  Transfusion per primary team.  No indication for ESA  Shortness of breath/concern for pneumonia: Management per primary  Urinary retention: Likely multifactorial.  Foley catheter in place.  Consider trial to void in the next 1 to 2 days  Recommendations conveyed to primary service.    [facility] 08/20/2020 11:35 AM  ___________________________________________________________  CC: Hyponatremia  Interval History/Subjective: Patient continues to be more clear and alert.  Pain is overall improved.  Signs of urinary obstruction yesterday requiring Foley catheter placement.  Excellent urine output over the past 24 hours   Medications:  Current Facility-Administered Medications  Medication Dose Route Frequency Provider Last Rate Last Admin  . 0.9 %  sodium chloride infusion  250 mL Intravenous Continuous Erline Levine, MD 1 mL/hr at 08/12/20 2311 250 mL at 08/12/20 2311  . acetaminophen (TYLENOL) tablet 650 mg  650 mg Oral Q4H PRN Erline Levine, MD       Or  . acetaminophen (TYLENOL)  suppository 650 mg  650 mg Rectal Q4H PRN Erline Levine, MD      . alum & mag hydroxide-simeth (MAALOX/MYLANTA) 200-200-20 MG/5ML suspension 30 mL  30 mL Oral Q6H PRN Erline Levine, MD   30 mL at 08/15/20 0956  . aspirin EC tablet 81 mg  81 mg Oral Q breakfast Erline Levine, MD   81 mg at 08/20/20 8003  . bisacodyl (DULCOLAX) suppository 10 mg  10 mg Rectal Daily PRN Erline Levine, MD   10 mg at 08/14/20 0249  . Chlorhexidine Gluconate Cloth 2 % PADS 6 each  6 each Topical Daily Erline Levine, MD   6 each at 08/20/20 0931  . conjugated estrogens (PREMARIN) vaginal cream 4.91 Applicatorful  0.5 g Vaginal Once per day on Mon Wed Fri Erline Levine, MD   7.91 Applicatorful at 50/56/97 0912  . dexamethasone (DECADRON) injection 4 mg  4 mg Intravenous Q6H Bergman, Meghan D, NP   4 mg at 08/20/20 0438  . HYDROcodone-acetaminophen (NORCO) 10-325 MG per tablet 1-2 tablet  1-2 tablet Oral Q4H PRN Rise Patience, MD   2 tablet at 08/19/20 1631  . HYDROmorphone (DILAUDID) injection 0.5 mg  0.5 mg Intravenous Q2H PRN Erline Levine, MD   0.5 mg at 08/17/20 1713  . lactose free nutrition (BOOST PLUS) liquid 237 mL  237 mL Oral TID WC Lavina Hamman, MD   237 mL at 08/20/20 0800  . menthol-cetylpyridinium (CEPACOL) lozenge 3 mg  1 lozenge Oral PRN Erline Levine, MD       Or  . phenol [facility]) mouth spray 1 spray  1 spray Mouth/Throat PRN Erline Levine, MD      . methenamine (MANDELAMINE) tablet 1,000 mg  1,000 mg  Oral BID WC Erline Levine, MD   1,000 mg at 08/20/20 0929  . methocarbamol (ROBAXIN) tablet 500 mg  500 mg Oral Q6H PRN Erline Levine, MD   500 mg at 08/20/20 0438   Or  . methocarbamol (ROBAXIN) 500 mg in dextrose 5 % 50 mL IVPB  500 mg Intravenous Q6H PRN Erline Levine, MD      . multivitamin with minerals tablet 1 tablet  1 tablet Oral Daily Erline Levine, MD   1 tablet at 08/20/20 [id number]  . ondansetron (ZOFRAN) tablet 4 mg  4 mg Oral Q6H PRN Erline Levine, MD       Or  . ondansetron  [facility]) injection 4 mg  4 mg Intravenous Q6H PRN Erline Levine, MD   4 mg at 08/15/20 0035  . oxyCODONE (Oxy IR/ROXICODONE) immediate release tablet 5 mg  5 mg Oral Q3H PRN Erline Levine, MD   5 mg at 08/20/20 0929  . pantoprazole (PROTONIX) EC tablet 40 mg  40 mg Oral QHS von Dohlen, Haley B, RPH   40 mg at 08/19/20 2026  . polyethylene glycol (MIRALAX / GLYCOLAX) packet 17 g  17 g Oral Daily Lavina Hamman, MD   17 g at 08/20/20 0928  . polyvinyl alcohol (LIQUIFILM TEARS) 1.4 % ophthalmic solution 1 drop  1 drop Both Eyes PRN Erline Levine, MD      . senna-docusate (Senokot-S) tablet 1 tablet  1 tablet Oral BID Lavina Hamman, MD   1 tablet at 08/20/20 0929  . sodium chloride flush (NS) 0.9 % injection 3 mL  3 mL Intravenous Q12H Erline Levine, MD   3 mL at 08/19/20 1012  . sodium chloride flush (NS) 0.9 % injection 3 mL  3 mL Intravenous PRN Erline Levine, MD      . tamsulosin [facility]) capsule 0.4 mg  0.4 mg Oral Daily Erline Levine, MD   0.4 mg at 08/20/20 3151  . urea (URE-NA) oral packet 30 g  30 g Oral BID Reesa Chew, MD   30 g at 08/20/20 0930      Review of Systems: 10 systems reviewed and negative except per interval history/subjective  Physical Exam: Vitals:   08/20/20 0800 08/20/20 1101  BP: (!) 141/67 (!) 142/62  Pulse: 97 94  Resp: 13   Temp: 98.9 F (37.2 C) 98.6 F (37 C)  SpO2: 96% 97%   Total I/O In: -  Out: 1250 [Urine:1250]  Intake/Output Summary (Last 24 hours) at 08/20/2020 1135 Last data filed at 08/20/2020 1102 Gross per 24 hour  Intake 360 ml  Output 6000 ml  Net -5640 ml   Constitutional: Lying in bed, no distress ENMT: ears and nose without scars or lesions, MMM CV: normal rate, no edema Respiratory: Bilateral chest rise, normal work of breathing Gastrointestinal: soft, non-tender, no palpable masses or hernias Skin: no visible lesions or rashes Psych: Awake, alert, interactive   Test Results I personally reviewed new and old clinical  labs and radiology tests Lab Results  Component Value Date   NA 127 (L) 08/20/2020   K 4.2 08/20/2020   CL 90 (L) 08/20/2020   CO2 29 08/20/2020   BUN 16 08/20/2020   CREATININE 0.59 08/20/2020   CALCIUM 9.0 08/20/2020

## 2020-08-20 NOTE — Progress Notes (Addendum)
Patient's sodium of 127 this AM. Notified Dr. Katheren Puller per order. No new order received at this time.

## 2020-08-20 NOTE — Progress Notes (Signed)
   Providing Compassionate, Quality Care - Together   Subjective: Patient reports she feels much better today. She feels her pain is well-controlled at this time. Patient's daughter reports much-improved mental status this morning.  Objective: Vital signs in last 24 hours: Temp:  [98.4 F (36.9 C)-98.9 F (37.2 C)] 98.9 F (37.2 C) (03/11 0800) Pulse Rate:  [82-97] 97 (03/11 0800) Resp:  [12-16] 13 (03/11 0800) BP: (108-144)/(54-70) 141/67 (03/11 0800) SpO2:  [94 %-97 %] 96 % (03/11 0800)  Intake/Output from previous day: 03/10 0701 - 03/11 0700 In: 360 [P.O.:360] Out: 4750 [Urine:4750] Intake/Output this shift: No intake/output data recorded.  Alert and oriented  PERRLA CN II-XII grossly intact MAE, Strength and sensation intact Incision is covered with Honeycomb dressing and Steri Strips; Dressing is clean, dry, and intact  Lab Results: Recent Labs    08/19/20 0546 08/20/20 0434  WBC 12.0* 15.2*  HGB 7.6* 8.1*  HCT 22.3* 22.5*  PLT 346 412*   BMET Recent Labs    08/19/20 0546 08/19/20 1045 08/19/20 2239 08/20/20 0434  NA 122*   < > 125* 127*  K 3.8  --   --  4.2  CL 87*  --   --  90*  CO2 27  --   --  29  GLUCOSE 143*  --   --  144*  BUN 9  --   --  16  CREATININE 0.54  --   --  0.59  CALCIUM 8.1*  --   --  9.0   < > = values in this interval not displayed.    Studies/Results: No results found.  Assessment/Plan: Patient is eight days s/pL5-S1 ALIF with right L2-L5 XLIF with percutaneous pedicle screws at L2-S1.Shehad some issues with pain controlShe has worked with [facility]. Continue TLSO brace when OOB. Patient has discussed and planned to do rehab at [facility].Shedeveloped increased somnolence on 08/17/2020. Lab work revealed sodium level of 117. The Hospitalists were consulted for further assistance with medical management. Nephrology also consulted. Hypertonic saline initiated 08/17/2020. 3% off 08/18/2020.  Sodium this AM 127.   LOS: 8 days    -Discharge to SNF once Nephrology and Hospitalists feel patient is medically ready. -Mobilize with assistance.   Viona Gilmore, DNP, AGNP-C Nurse Practitioner  [facility] [address], [address], [place], [place] [address] P: [phone]    F: [phone]  08/20/2020, 8:51 AM

## 2020-08-21 DIAGNOSIS — E871 Hypo-osmolality and hyponatremia: Secondary | ICD-10-CM | POA: Diagnosis not present

## 2020-08-21 LAB — BASIC METABOLIC PANEL WITH GFR
Anion gap: 8 (ref 5–15)
BUN: 20 mg/dL (ref 8–23)
CO2: 29 mmol/L (ref 22–32)
Calcium: 8.7 mg/dL — ABNORMAL LOW (ref 8.9–10.3)
Chloride: 94 mmol/L — ABNORMAL LOW (ref 98–111)
Creatinine, Ser: 0.62 mg/dL (ref 0.44–1.00)
GFR, Estimated: >60 mL/min
Glucose, Bld: 180 mg/dL — ABNORMAL HIGH (ref 70–99)
Potassium: 4.3 mmol/L (ref 3.5–5.1)
Sodium: 131 mmol/L — ABNORMAL LOW (ref 135–145)

## 2020-08-21 LAB — CBC
HCT: 24.9 % — ABNORMAL LOW (ref 36.0–46.0)
Hemoglobin: 8.3 g/dL — ABNORMAL LOW (ref 12.0–15.0)
MCH: 33.1 pg (ref 26.0–34.0)
MCHC: 33.3 g/dL (ref 30.0–36.0)
MCV: 99.2 fL (ref 80.0–100.0)
Platelets: 462 10*3/uL — ABNORMAL HIGH (ref 150–400)
RBC: 2.51 MIL/uL — ABNORMAL LOW (ref 3.87–5.11)
RDW: 13.4 % (ref 11.5–15.5)
WBC: 18.5 10*3/uL — ABNORMAL HIGH (ref 4.0–10.5)
nRBC: 0.5 % — ABNORMAL HIGH (ref 0.0–0.2)

## 2020-08-21 LAB — MAGNESIUM: Magnesium: 1.7 mg/dL (ref 1.7–2.4)

## 2020-08-21 LAB — SODIUM: Sodium: 134 mmol/L — ABNORMAL LOW (ref 135–145)

## 2020-08-21 NOTE — Progress Notes (Signed)
[facility] Consultation Progress Note  Patient: Carol Perez HFW:[id number]   PCP: Virgie Dad, MD DOB: June 12, 1941   DOA: 08/12/2020   DOS: 08/21/2020   Date of Service: the patient was seen and examined on 08/21/2020 Primary service: Erline Levine, MD    Brief hospital course: Pt. with PMH of recurrent UTI on prophylaxis, lymphocytic colitis, chronic lumbago; admitted on 08/12/2020, with complaint of worsening back pain admitted for lumbar surgery.underwent elective lumbar spine fusion 2-3, 3-4, 4-5.  Postoperatively patient had poor p.o. intake and started having worsening sodium level with hyponatremia.  Also had worsening mental status change as well as anemia and therefore hospitalist was consulted.  Nephrology also consulted.  Currently further plan is monitor sodium level and treatment under nephrology guidance, monitor H&H and transfuse for hemoglobin less than 7, monitor mental status and adjust meds appropriately.  Subjective: Pain is well controlled.  No nausea no vomiting but no fever no chills.  Cough is still present.  Oral intake adequate.  No constipation.  Assessment and Plan: 1.  SIADH Severe symptomatic hyponatremia.  Hypervolemic Management per nephrology. Patient was receiving half-normal saline which can contribute as well. On trazodone and gabapentin which can also contribute to SIADH. Urine sodium 79, osmolality 427.  Serum osmolality 240. Started on hypertonic saline 3% on 3/8 then stopped. Received intermittent Lasix. Sodium currently almost normal.  No further therapy per nephrology.  2.  Acute metabolic encephalopathy-resolved Mentation appears to be baseline. In the setting of severe hyponatremia. As well as polypharmacy in hospital induced delirium. Minimizing narcotic medication. Discontinue gabapentin and trazodone. Currently no focal deficit on examination.  No asterixis on examination.  3.  Postop acute blood loss anemia. Likely there is also  component of dilution. H&H currently remaining stable. Transfuse for hemoglobin less than 7.  4.  Cough. Chest x-ray negative for any acute pneumonia. Monitor for now. Aspiration precautions.  5.  Urinary retention. Resolved.  6.  Constipation. Resolved. Bowel regimen initiated.  7.  Poor p.o. intake. Lactose intolerant. Added boost.  8.  Chest tightness.  Intermittent, atypical, reproducible EKG unremarkable. Pain present for last few days without any resolution. Chest x-ray earlier unremarkable. Continue to monitor for now.  9.  Leukocytosis. Secondary to steroid use. Recommended against the dose of the steroid if possible as it might complicate hyponatremia.  Recommendation on discharge: Continue to hold gabapentin and trazodone on discharge. Reduce Steroid dose.  Diet: Regular diet DVT Prophylaxis: SCD, pharmacological prophylaxis contraindicated due to recent neuro surgery   Family Communication: family was present at bedside, at the time of interview. The pt provided permission to discuss medical plan with the family. Opportunity was given to ask question and all questions were answered satisfactorily.   Disposition: We will continue to follow the patient.   Other Consultants: Nephrology Procedures: none Antibiotics: Anti-infectives (From admission, onward)   Start     Dose/Rate Route Frequency Ordered Stop   08/13/20 1000  valACYclovir (VALTREX) tablet 500 mg  Status:  Discontinued        500 mg Oral Daily 08/12/20 2048 08/20/20 0751   08/13/20 0800  methenamine (HIPREX) tablet 1 g  Status:  Discontinued       Note to Pharmacy: For UTI Prevention. Last UTI: October 20th, November 5th, and December 28th.     1 g Oral 2 times daily with meals 08/12/20 2048 08/12/20 2107   08/13/20 0800  methenamine (MANDELAMINE) tablet 1,000 mg  1,000 mg Oral 2 times daily with meals 08/12/20 2108     08/12/20 2230  vancomycin (VANCOREADY) IVPB 500 mg/100 mL         500 mg 100 mL/hr over 60 Minutes Intravenous  Once 08/12/20 2139 08/13/20 0853   08/12/20 0600  vancomycin (VANCOCIN) IVPB 1000 mg/200 mL premix        1,000 mg 200 mL/hr over 60 Minutes Intravenous On call to O.R. 08/12/20 0536 08/12/20 0910   08/12/20 0545  vancomycin (VANCOCIN) 1-5 GM/200ML-% IVPB       Note to Pharmacy: Tamsen Snider   : cabinet override      08/12/20 0545 08/12/20 1759      Objective: Physical Exam: Vitals:   08/21/20 1600 08/21/20 1700 08/21/20 1800 08/21/20 1900  BP: (!) 134/54     Pulse: 93 89    Resp: 16 15 14 13   Temp: 98.4 F (36.9 C)     TempSrc: Oral     SpO2: 100%     Weight:      Height:        Intake/Output Summary (Last 24 hours) at 08/21/2020 1953 Last data filed at 08/21/2020 1800 Gross per 24 hour  Intake 3 ml  Output 2500 ml  Net -2497 ml   Filed Weights   08/12/20 0557  Weight: 60.7 kg   General: Appear in mild distress, no Rash; Oral Mucosa Clear, moist. no Abnormal Neck Mass Or lumps, Conjunctiva normal  Cardiovascular: S1 and S2 Present, no Murmur, Respiratory: good respiratory effort, Bilateral Air entry present and CTA, no Crackles, no wheezes Abdomen: Bowel Sound present, Soft and no tenderness Extremities: no Pedal edema Neurology: alert and oriented to time, place, and person affect appropriate. no new focal deficit Gait not checked due to patient safety concerns  Data Reviewed: CBC: Recent Labs  Lab 08/17/20 1229 08/18/20 0552 08/19/20 0546 08/20/20 0434 08/21/20 0416  WBC 11.1* 11.1* 12.0* 15.2* 18.5*  HGB 7.8* 7.5* 7.6* 8.1* 8.3*  HCT 22.7* 21.7* 22.3* 22.5* 24.9*  MCV 96.2 95.2 97.4 94.5 99.2  PLT 264 279 346 412* 338*   Basic Metabolic Panel: Recent Labs  Lab 08/17/20 1543 08/17/20 1804 08/18/20 0552 08/18/20 1337 08/19/20 0546 08/19/20 1045 08/19/20 2239 08/20/20 0434 08/20/20 1410 08/21/20 0416 08/21/20 1259  NA 115*   < > 118*   < > 122*   < > 125* 127* 132* 131* 134*  K 4.7  --  4.2  --   3.8  --   --  4.2  --  4.3  --   CL 83*  --  85*  --  87*  --   --  90*  --  94*  --   CO2 25  --  24  --  27  --   --  29  --  29  --   GLUCOSE 103*  --  94  --  143*  --   --  144*  --  180*  --   BUN 8  --  6*  --  9  --   --  16  --  20  --   CREATININE 0.46  --  0.46  --  0.54  --   --  0.59  --  0.62  --   CALCIUM 7.5*  --  7.5*  --  8.1*  --   --  9.0  --  8.7*  --   MG  --   --   --   --  1.6*  --   --  2.0  --  1.7  --    < > = values in this interval not displayed.   Liver Function Tests: No results for input(s): AST, ALT, ALKPHOS, BILITOT, PROT, ALBUMIN in the last 168 hours. No results for input(s): LIPASE, AMYLASE in the last 168 hours. No results for input(s): AMMONIA in the last 168 hours. Cardiac Enzymes: No results for input(s): CKTOTAL, CKMB, CKMBINDEX, TROPONINI in the last 168 hours. BNP (last 3 results) No results for input(s): BNP in the last 8760 hours. CBG: Recent Labs  Lab 08/17/20 1117  GLUCAP 145*   No results found for this or any previous visit (from the past 240 hour(s)).  Studies: No results found.   Scheduled Meds: . aspirin EC  81 mg Oral Q breakfast  . Chlorhexidine Gluconate Cloth  6 each Topical Daily  . conjugated estrogens  0.5 g Vaginal Once per day on Mon Wed Fri  . lactose free nutrition  237 mL Oral TID WC  . methenamine  1,000 mg Oral BID WC  . multivitamin with minerals  1 tablet Oral Daily  . pantoprazole  40 mg Oral QHS  . polyethylene glycol  17 g Oral Daily  . senna-docusate  1 tablet Oral BID  . sodium chloride flush  3 mL Intravenous Q12H  . tamsulosin  0.4 mg Oral Daily   Continuous Infusions: . sodium chloride 250 mL (08/12/20 2311)  . methocarbamol (ROBAXIN) IV     PRN Meds: acetaminophen **OR** acetaminophen, alum & mag hydroxide-simeth, bisacodyl, HYDROcodone-acetaminophen, HYDROmorphone (DILAUDID) injection, menthol-cetylpyridinium **OR** phenol, methocarbamol **OR** methocarbamol (ROBAXIN) IV, ondansetron **OR**  ondansetron (ZOFRAN) IV, oxyCODONE, polyvinyl alcohol, sodium chloride flush  Time spent: 35 minutes  Author: Berle Mull, MD [facility] 08/21/2020 7:53 PM  To reach On-call, see care teams to locate the attending and reach out to them via [email or web address]. If 7PM-7AM, please contact night-coverage If you still have difficulty reaching the attending provider, please page the [facility] (Director on Call) for [facility] on amion for assistance.

## 2020-08-21 NOTE — Progress Notes (Signed)
Subjective: Patient reports some back pain but improving. No acute events overnight   Objective: Vital signs in last 24 hours: Temp:  [97.6 F (36.4 C)-98.6 F (37 C)] 98.3 F (36.8 C) (03/12 0719) Pulse Rate:  [80-97] 80 (03/12 0719) Resp:  [11-17] 13 (03/12 0719) BP: (140-156)/(62-74) 153/74 (03/12 0719) SpO2:  [97 %-99 %] 97 % (03/12 0310)  Intake/Output from previous day: 03/11 0701 - 03/12 0700 In: 717 [P.O.:714; I.V.:3] Out: 3250 [Urine:3250] Intake/Output this shift: No intake/output data recorded.  Neurologic: Grossly normal  Lab Results: Lab Results  Component Value Date   WBC 18.5 (H) 08/21/2020   HGB 8.3 (L) 08/21/2020   HCT 24.9 (L) 08/21/2020   MCV 99.2 08/21/2020   PLT 462 (H) 08/21/2020   No results found for: INR, PROTIME BMET Lab Results  Component Value Date   NA 131 (L) 08/21/2020   K 4.3 08/21/2020   CL 94 (L) 08/21/2020   CO2 29 08/21/2020   GLUCOSE 180 (H) 08/21/2020   BUN 20 08/21/2020   CREATININE 0.62 08/21/2020   CALCIUM 8.7 (L) 08/21/2020    Studies/Results: No results found.  Assessment/Plan: S/p ALIF/XLIF 9 days ago. Had some issues with hyponatremia but sodium is improving. Awaiting SNF placement.    LOS: 9 days    Ocie Cornfield [facility] 08/21/2020, 8:28 AM

## 2020-08-21 NOTE — Progress Notes (Signed)
Nephrology Follow-Up Consult note   Assessment/Recommendations: Carol Perez is a/an 80 y.o. female with a past medical history significant for recurrent UTI, lymphocytic colitis, back pain, admitted for back surgery related to lumbar stenosis with radiculopathy complicated by hyponatremia.     Severe symptomatic hyponatremia; SIADH; improving: Sodium continues to improve 131 today. -Holding therapy at this time -Continue with fluid restriction for now and liberalize if Na continues to improve w/o therapy -Sodium can correct on its own at this time -Continue to monitor sodium twice daily -Consider restarting urea if sodium falls below 130 -Pain control per primary team  Encephalopathy: Now resolved.  Much improved with improving sodium  Anemia: Also likely multifactorial.  Transfusion per primary team.  No indication for ESA  Shortness of breath/concern for pneumonia: Management per primary  Urinary retention: Likely multifactorial.  Foley catheter in place with plans for TOV today.  Recommendations conveyed to primary service.    [facility] 08/21/2020 10:28 AM  ___________________________________________________________  CC: Hyponatremia  Interval History/Subjective: She states she continues to feel well today.  No significant complaints.  No problems or confusion.  Hoping to get to rehab on Monday   Medications:  Current Facility-Administered Medications  Medication Dose Route Frequency Provider Last Rate Last Admin  . 0.9 %  sodium chloride infusion  250 mL Intravenous Continuous Erline Levine, MD 1 mL/hr at 08/12/20 2311 250 mL at 08/12/20 2311  . acetaminophen (TYLENOL) tablet 650 mg  650 mg Oral Q4H PRN Erline Levine, MD       Or  . acetaminophen (TYLENOL) suppository 650 mg  650 mg Rectal Q4H PRN Erline Levine, MD      . alum & mag hydroxide-simeth (MAALOX/MYLANTA) 200-200-20 MG/5ML suspension 30 mL  30 mL Oral Q6H PRN Erline Levine,  MD   30 mL at 08/15/20 0956  . aspirin EC tablet 81 mg  81 mg Oral Q breakfast Erline Levine, MD   81 mg at 08/21/20 5102  . bisacodyl (DULCOLAX) suppository 10 mg  10 mg Rectal Daily PRN Erline Levine, MD   10 mg at 08/14/20 0249  . Chlorhexidine Gluconate Cloth 2 % PADS 6 each  6 each Topical Daily Erline Levine, MD   6 each at 08/20/20 0931  . conjugated estrogens (PREMARIN) vaginal cream 5.85 Applicatorful  0.5 g Vaginal Once per day on Mon Wed Fri Erline Levine, MD   2.77 Applicatorful at 82/42/35 1749  . HYDROcodone-acetaminophen (NORCO) 10-325 MG per tablet 1-2 tablet  1-2 tablet Oral Q4H PRN Rise Patience, MD   1 tablet at 08/21/20 0957  . HYDROmorphone (DILAUDID) injection 0.5 mg  0.5 mg Intravenous Q2H PRN Erline Levine, MD   0.5 mg at 08/17/20 1713  . lactose free nutrition (BOOST PLUS) liquid 237 mL  237 mL Oral TID WC Lavina Hamman, MD   237 mL at 08/21/20 0821  . menthol-cetylpyridinium (CEPACOL) lozenge 3 mg  1 lozenge Oral PRN Erline Levine, MD       Or  . phenol [facility]) mouth spray 1 spray  1 spray Mouth/Throat PRN Erline Levine, MD      . methenamine (MANDELAMINE) tablet 1,000 mg  1,000 mg Oral BID WC Erline Levine, MD   1,000 mg at 08/21/20 3614  . methocarbamol (ROBAXIN) tablet 500 mg  500 mg Oral Q6H PRN Erline Levine, MD   500 mg at 08/21/20 0957   Or  . methocarbamol (ROBAXIN) 500 mg in dextrose 5 % 50 mL  IVPB  500 mg Intravenous Q6H PRN Erline Levine, MD      . multivitamin with minerals tablet 1 tablet  1 tablet Oral Daily Erline Levine, MD   1 tablet at 08/21/20 0957  . ondansetron (ZOFRAN) tablet 4 mg  4 mg Oral Q6H PRN Erline Levine, MD       Or  . ondansetron [facility]) injection 4 mg  4 mg Intravenous Q6H PRN Erline Levine, MD   4 mg at 08/15/20 0035  . oxyCODONE (Oxy IR/ROXICODONE) immediate release tablet 5 mg  5 mg Oral Q3H PRN Erline Levine, MD   5 mg at 08/20/20 1747  . pantoprazole (PROTONIX) EC tablet 40 mg  40 mg Oral QHS von Verdene Rio B,  RPH   40 mg at 08/20/20 2053  . polyethylene glycol (MIRALAX / GLYCOLAX) packet 17 g  17 g Oral Daily Lavina Hamman, MD   17 g at 08/20/20 0928  . polyvinyl alcohol (LIQUIFILM TEARS) 1.4 % ophthalmic solution 1 drop  1 drop Both Eyes PRN Erline Levine, MD      . senna-docusate (Senokot-S) tablet 1 tablet  1 tablet Oral BID Lavina Hamman, MD   1 tablet at 08/20/20 2053  . sodium chloride flush (NS) 0.9 % injection 3 mL  3 mL Intravenous Q12H Erline Levine, MD   3 mL at 08/21/20 0959  . sodium chloride flush (NS) 0.9 % injection 3 mL  3 mL Intravenous PRN Erline Levine, MD      . tamsulosin [facility]) capsule 0.4 mg  0.4 mg Oral Daily Erline Levine, MD   0.4 mg at 08/21/20 0923      Review of Systems: 10 systems reviewed and negative except per interval history/subjective  Physical Exam: Vitals:   08/21/20 0900 08/21/20 1000  BP:    Pulse: 87 86  Resp: 16 16  Temp:    SpO2:     Total I/O In: -  Out: 1050 [Urine:1050]  Intake/Output Summary (Last 24 hours) at 08/21/2020 1028 Last data filed at 08/21/2020 0800 Gross per 24 hour  Intake 477 ml  Output 4300 ml  Net -3823 ml   Constitutional: Lying in bed, no distress ENMT: ears and nose without scars or lesions, MMM CV: normal rate, no edema Respiratory: Bilateral chest rise, normal work of breathing Gastrointestinal: soft, non-tender, no palpable masses or hernias Skin: no visible lesions or rashes Psych: Awake, alert, interactive   Test Results I personally reviewed new and old clinical labs and radiology tests Lab Results  Component Value Date   NA 131 (L) 08/21/2020   K 4.3 08/21/2020   CL 94 (L) 08/21/2020   CO2 29 08/21/2020   BUN 20 08/21/2020   CREATININE 0.62 08/21/2020   CALCIUM 8.7 (L) 08/21/2020

## 2020-08-22 DIAGNOSIS — E871 Hypo-osmolality and hyponatremia: Secondary | ICD-10-CM | POA: Diagnosis not present

## 2020-08-22 LAB — BASIC METABOLIC PANEL
Anion gap: 7 (ref 5–15)
BUN: 15 mg/dL (ref 8–23)
CO2: 30 mmol/L (ref 22–32)
Calcium: 8.3 mg/dL — ABNORMAL LOW (ref 8.9–10.3)
Chloride: 98 mmol/L (ref 98–111)
Creatinine, Ser: 0.68 mg/dL (ref 0.44–1.00)
GFR, Estimated: >60 mL/min (ref >60)
Glucose, Bld: 89 mg/dL (ref 70–99)
Potassium: 4.2 mmol/L (ref 3.5–5.1)
Sodium: 135 mmol/L (ref 135–145)

## 2020-08-22 LAB — CBC
HCT: 25.1 % — ABNORMAL LOW (ref 36.0–46.0)
Hemoglobin: 8.2 g/dL — ABNORMAL LOW (ref 12.0–15.0)
MCH: 32.8 pg (ref 26.0–34.0)
MCHC: 32.7 g/dL (ref 30.0–36.0)
MCV: 100.4 fL — ABNORMAL HIGH (ref 80.0–100.0)
Platelets: 491 10*3/uL — ABNORMAL HIGH (ref 150–400)
RBC: 2.5 MIL/uL — ABNORMAL LOW (ref 3.87–5.11)
RDW: 14.1 % (ref 11.5–15.5)
WBC: 20 10*3/uL — ABNORMAL HIGH (ref 4.0–10.5)
nRBC: 0.3 % — ABNORMAL HIGH (ref 0.0–0.2)

## 2020-08-22 LAB — SARS CORONAVIRUS 2 (TAT 6-24 HRS): SARS Coronavirus 2: NEGATIVE

## 2020-08-22 LAB — MAGNESIUM: Magnesium: 1.7 mg/dL (ref 1.7–2.4)

## 2020-08-22 NOTE — Progress Notes (Addendum)
[facility] Consultation Progress Note  Patient: Carol Perez STM:[id number]   PCP: Virgie Dad, MD DOB: 12/14/1940   DOA: 08/12/2020   DOS: 08/22/2020   Date of Service: the patient was seen and examined on 08/22/2020 Primary service: Erline Levine, MD    Brief hospital course: Pt. with PMH of recurrent UTI on prophylaxis, lymphocytic colitis, chronic lumbago; admitted on 08/12/2020, with complaint of worsening back pain admitted for lumbar surgery.underwent elective lumbar spine fusion 2-3, 3-4, 4-5.  Postoperatively patient had poor p.o. intake and started having worsening sodium level with hyponatremia.  Also had worsening mental status change as well as anemia and therefore hospitalist was consulted.  Nephrology also consulted.  Currently further plan is monitor sodium level and treatment under nephrology guidance, monitor H&H and transfuse for hemoglobin less than 7, monitor mental status and adjust meds appropriately.  Subjective: No nausea no vomiting but no fever no chills.  No acute complaints.  Assessment and Plan: 1.  SIADH resolved Severe symptomatic hyponatremia.  Hypervolemic Management per nephrology. Patient was receiving half-normal saline which can contribute as well. On trazodone and gabapentin which can also contribute to SIADH. Urine sodium 79, osmolality 427.  Serum osmolality 240. Started on hypertonic saline 3% on 3/8 then stopped. Received intermittent Lasix. Sodium currently almost normal.  No further therapy per nephrology.  2.  Acute metabolic encephalopathy-resolved Mentation appears to be baseline. In the setting of severe hyponatremia. As well as polypharmacy in hospital induced delirium. Minimizing narcotic medication. Discontinue gabapentin and trazodone. Currently no focal deficit on examination.  No asterixis on examination.  3.  Postop acute blood loss anemia. Likely there is also component of dilution. H&H currently remaining  stable. Transfuse for hemoglobin less than 7.  4.  Cough. Chest x-ray negative for any acute pneumonia. Monitor for now. Aspiration precautions.  5.  Urinary retention. Resolved.  6.  Constipation. Resolved. Bowel regimen initiated.  7.  Poor p.o. intake. Lactose intolerant. Added boost.  Patient does not want to continue with outside of the hospital although recommend to monitor oral intake and nutrition.  8.  Chest tightness.  Intermittent, atypical, reproducible EKG unremarkable. Pain present for last few days without any resolution. Chest x-ray earlier unremarkable. Continue to monitor for now.  9.  Leukocytosis. Secondary to steroid use. Recommended against the dose of the steroid if possible as it might complicate hyponatremia.  Recommendation on discharge: We will sign off.  Continue to hold gabapentin and trazodone on discharge.  Repeat BMP in 1 week. If the sodium is improving.  Recommend to resume gabapentin but continue to hold trazodone. Reduce Steroid dose.  Diet: Regular diet DVT Prophylaxis: SCD, pharmacological prophylaxis contraindicated due to recent neuro surgery   Family Communication: family was present at bedside, at the time of interview. The pt provided permission to discuss medical plan with the family. Opportunity was given to ask question and all questions were answered satisfactorily.   Disposition: We will continue to follow the patient.   Other Consultants: Nephrology Procedures: none Antibiotics: Anti-infectives (From admission, onward)   Start     Dose/Rate Route Frequency Ordered Stop   08/13/20 1000  valACYclovir (VALTREX) tablet 500 mg  Status:  Discontinued        500 mg Oral Daily 08/12/20 2048 08/20/20 0751   08/13/20 0800  methenamine (HIPREX) tablet 1 g  Status:  Discontinued       Note to Pharmacy: For UTI Prevention. Last UTI: October 20th, November 5th, and December  28th.     1 g Oral 2 times daily with meals 08/12/20  2048 08/12/20 2107   08/13/20 0800  methenamine (MANDELAMINE) tablet 1,000 mg        1,000 mg Oral 2 times daily with meals 08/12/20 2108     08/12/20 2230  vancomycin (VANCOREADY) IVPB 500 mg/100 mL        500 mg 100 mL/hr over 60 Minutes Intravenous  Once 08/12/20 2139 08/13/20 0853   08/12/20 0600  vancomycin (VANCOCIN) IVPB 1000 mg/200 mL premix        1,000 mg 200 mL/hr over 60 Minutes Intravenous On call to O.R. 08/12/20 0536 08/12/20 0910   08/12/20 0545  vancomycin (VANCOCIN) 1-5 GM/200ML-% IVPB       Note to Pharmacy: Tamsen Snider   : cabinet override      08/12/20 0545 08/12/20 1759      Objective: Physical Exam: Vitals:   08/22/20 0700 08/22/20 0800 08/22/20 1144 08/22/20 1604  BP: (!) 134/56 128/63 (!) 117/51 (!) 127/58  Pulse:   75 87  Resp: 13 (!) 22 18 20   Temp: 98.5 F (36.9 C)  98 F (36.7 C) 98.4 F (36.9 C)  TempSrc: Oral     SpO2: 100%   99%  Weight:      Height:        Intake/Output Summary (Last 24 hours) at 08/22/2020 2030 Last data filed at 08/22/2020 2000 Gross per 24 hour  Intake 480 ml  Output 2200 ml  Net -1720 ml   Filed Weights   08/12/20 0557  Weight: 60.7 kg   General: Appear in mild distress, no Rash; Oral Mucosa Clear, moist. no Abnormal Neck Mass Or lumps, Conjunctiva normal  Cardiovascular: S1 and S2 Present, no Murmur, Respiratory: good respiratory effort, Bilateral Air entry present and CTA, no Crackles, no wheezes Abdomen: Bowel Sound present, Soft and no tenderness Extremities: no Pedal edema  Data Reviewed: CBC: Recent Labs  Lab 08/18/20 0552 08/19/20 0546 08/20/20 0434 08/21/20 0416 08/22/20 0415  WBC 11.1* 12.0* 15.2* 18.5* 20.0*  HGB 7.5* 7.6* 8.1* 8.3* 8.2*  HCT 21.7* 22.3* 22.5* 24.9* 25.1*  MCV 95.2 97.4 94.5 99.2 100.4*  PLT 279 346 412* 462* 161*   Basic Metabolic Panel: Recent Labs  Lab 08/18/20 0552 08/18/20 1337 08/19/20 0546 08/19/20 1045 08/20/20 0434 08/20/20 1410 08/21/20 0416  08/21/20 1259 08/22/20 0415  NA 118*   < > 122*   < > 127* 132* 131* 134* 135  K 4.2  --  3.8  --  4.2  --  4.3  --  4.2  CL 85*  --  87*  --  90*  --  94*  --  98  CO2 24  --  27  --  29  --  29  --  30  GLUCOSE 94  --  143*  --  144*  --  180*  --  89  BUN 6*  --  9  --  16  --  20  --  15  CREATININE 0.46  --  0.54  --  0.59  --  0.62  --  0.68  CALCIUM 7.5*  --  8.1*  --  9.0  --  8.7*  --  8.3*  MG  --   --  1.6*  --  2.0  --  1.7  --  1.7   < > = values in this interval not displayed.   Liver Function Tests: No results for  input(s): AST, ALT, ALKPHOS, BILITOT, PROT, ALBUMIN in the last 168 hours. No results for input(s): LIPASE, AMYLASE in the last 168 hours. No results for input(s): AMMONIA in the last 168 hours. Cardiac Enzymes: No results for input(s): CKTOTAL, CKMB, CKMBINDEX, TROPONINI in the last 168 hours. BNP (last 3 results) No results for input(s): BNP in the last 8760 hours. CBG: Recent Labs  Lab 08/17/20 1117  GLUCAP 145*   No results found for this or any previous visit (from the past 240 hour(s)).  Studies: No results found.   Scheduled Meds: . aspirin EC  81 mg Oral Q breakfast  . Chlorhexidine Gluconate Cloth  6 each Topical Daily  . conjugated estrogens  0.5 g Vaginal Once per day on Mon Wed Fri  . lactose free nutrition  237 mL Oral TID WC  . methenamine  1,000 mg Oral BID WC  . multivitamin with minerals  1 tablet Oral Daily  . pantoprazole  40 mg Oral QHS  . polyethylene glycol  17 g Oral Daily  . senna-docusate  1 tablet Oral BID  . sodium chloride flush  3 mL Intravenous Q12H  . tamsulosin  0.4 mg Oral Daily   Continuous Infusions: . sodium chloride 250 mL (08/12/20 2311)  . methocarbamol (ROBAXIN) IV     PRN Meds: acetaminophen **OR** acetaminophen, alum & mag hydroxide-simeth, bisacodyl, HYDROcodone-acetaminophen, HYDROmorphone (DILAUDID) injection, menthol-cetylpyridinium **OR** phenol, methocarbamol **OR** methocarbamol (ROBAXIN) IV,  ondansetron **OR** ondansetron (ZOFRAN) IV, oxyCODONE, polyvinyl alcohol, sodium chloride flush  Time spent: 35 minutes  Author: Berle Mull, MD [facility] 08/22/2020 8:30 PM  To reach On-call, see care teams to locate the attending and reach out to them via [email or web address]. If 7PM-7AM, please contact night-coverage If you still have difficulty reaching the attending provider, please page the [facility] (Director on Call) for [facility] on amion for assistance.

## 2020-08-22 NOTE — Progress Notes (Signed)
Nephrology Follow-Up Consult note   Assessment/Recommendations: Carol Perez is a/an 80 y.o. female with a past medical history significant for recurrent UTI, lymphocytic colitis, back pain, admitted for back surgery related to lumbar stenosis with radiculopathy complicated by hyponatremia.     Severe symptomatic hyponatremia; SIADH; improving: Now resolved with normal sodium.  Likely related to SIADH resolving with pain control. -Can drink to thirst -We will check sodium next week around Wednesday or Thursday, can monitor daily while she remains inpatient -Pain control per primary team  Encephalopathy: Now resolved.  Much improved with improving sodium  Anemia: Also likely multifactorial.  Transfusion per primary team.  No indication for ESA  Shortness of breath/concern for pneumonia: Management per primary  Urinary retention: Likely multifactorial.  Foley catheter now removed and voiding spontaneously  Given the improved sodium we will sign off at this time.  Please do not hesitate to contact our team if needed  Recommendations conveyed to primary service.    [facility] 08/22/2020 10:09 AM  ___________________________________________________________  CC: Hyponatremia  Interval History/Subjective: Patient feels well today with no complaints.  Foley catheter removed yesterday.   Medications:  Current Facility-Administered Medications  Medication Dose Route Frequency Provider Last Rate Last Admin  . 0.9 %  sodium chloride infusion  250 mL Intravenous Continuous Erline Levine, MD 1 mL/hr at 08/12/20 2311 250 mL at 08/12/20 2311  . acetaminophen (TYLENOL) tablet 650 mg  650 mg Oral Q4H PRN Erline Levine, MD       Or  . acetaminophen (TYLENOL) suppository 650 mg  650 mg Rectal Q4H PRN Erline Levine, MD      . alum & mag hydroxide-simeth (MAALOX/MYLANTA) 200-200-20 MG/5ML suspension 30 mL  30 mL Oral Q6H PRN Erline Levine, MD   30 mL at 08/15/20  0956  . aspirin EC tablet 81 mg  81 mg Oral Q breakfast Erline Levine, MD   81 mg at 08/22/20 0815  . bisacodyl (DULCOLAX) suppository 10 mg  10 mg Rectal Daily PRN Erline Levine, MD   10 mg at 08/14/20 0249  . Chlorhexidine Gluconate Cloth 2 % PADS 6 each  6 each Topical Daily Erline Levine, MD   6 each at 08/21/20 1126  . conjugated estrogens (PREMARIN) vaginal cream 2.97 Applicatorful  0.5 g Vaginal Once per day on Mon Wed Fri Erline Levine, MD   9.89 Applicatorful at 21/19/41 1749  . HYDROcodone-acetaminophen (NORCO) 10-325 MG per tablet 1-2 tablet  1-2 tablet Oral Q4H PRN Rise Patience, MD   2 tablet at 08/22/20 0547  . HYDROmorphone (DILAUDID) injection 0.5 mg  0.5 mg Intravenous Q2H PRN Erline Levine, MD   0.5 mg at 08/17/20 1713  . lactose free nutrition (BOOST PLUS) liquid 237 mL  237 mL Oral TID WC Lavina Hamman, MD   237 mL at 08/21/20 1612  . menthol-cetylpyridinium (CEPACOL) lozenge 3 mg  1 lozenge Oral PRN Erline Levine, MD       Or  . phenol [facility]) mouth spray 1 spray  1 spray Mouth/Throat PRN Erline Levine, MD      . methenamine (MANDELAMINE) tablet 1,000 mg  1,000 mg Oral BID WC Erline Levine, MD   1,000 mg at 08/22/20 7408  . methocarbamol (ROBAXIN) tablet 500 mg  500 mg Oral Q6H PRN Erline Levine, MD   500 mg at 08/22/20 0815   Or  . methocarbamol (ROBAXIN) 500 mg in dextrose 5 % 50 mL IVPB  500 mg Intravenous Q6H  PRN Erline Levine, MD      . multivitamin with minerals tablet 1 tablet  1 tablet Oral Daily Erline Levine, MD   1 tablet at 08/22/20 0815  . ondansetron (ZOFRAN) tablet 4 mg  4 mg Oral Q6H PRN Erline Levine, MD       Or  . ondansetron [facility]) injection 4 mg  4 mg Intravenous Q6H PRN Erline Levine, MD   4 mg at 08/15/20 0035  . oxyCODONE (Oxy IR/ROXICODONE) immediate release tablet 5 mg  5 mg Oral Q3H PRN Erline Levine, MD   5 mg at 08/21/20 2108  . pantoprazole (PROTONIX) EC tablet 40 mg  40 mg Oral QHS von Verdene Rio B, RPH   40 mg at 08/21/20  2108  . polyethylene glycol (MIRALAX / GLYCOLAX) packet 17 g  17 g Oral Daily Lavina Hamman, MD   17 g at 08/20/20 0928  . polyvinyl alcohol (LIQUIFILM TEARS) 1.4 % ophthalmic solution 1 drop  1 drop Both Eyes PRN Erline Levine, MD      . senna-docusate (Senokot-S) tablet 1 tablet  1 tablet Oral BID Lavina Hamman, MD   1 tablet at 08/20/20 2053  . sodium chloride flush (NS) 0.9 % injection 3 mL  3 mL Intravenous Q12H Erline Levine, MD   3 mL at 08/22/20 0819  . sodium chloride flush (NS) 0.9 % injection 3 mL  3 mL Intravenous PRN Erline Levine, MD      . tamsulosin [facility]) capsule 0.4 mg  0.4 mg Oral Daily Erline Levine, MD   0.4 mg at 08/22/20 0815      Review of Systems: 10 systems reviewed and negative except per interval history/subjective  Physical Exam: Vitals:   08/22/20 0600 08/22/20 0700  BP:  (!) 134/56  Pulse:    Resp: 10 13  Temp:  98.5 F (36.9 C)  SpO2:  100%   No intake/output data recorded.  Intake/Output Summary (Last 24 hours) at 08/22/2020 1009 Last data filed at 08/22/2020 0600 Gross per 24 hour  Intake 240 ml  Output 2650 ml  Net -2410 ml   Constitutional: Lying in bed, no distress ENMT: ears and nose without scars or lesions, MMM CV: normal rate, no edema Respiratory: Bilateral chest rise, normal work of breathing Gastrointestinal: soft, non-tender, no palpable masses or hernias Skin: no visible lesions or rashes Psych: Awake, alert, interactive   Test Results I personally reviewed new and old clinical labs and radiology tests Lab Results  Component Value Date   NA 135 08/22/2020   K 4.2 08/22/2020   CL 98 08/22/2020   CO2 30 08/22/2020   BUN 15 08/22/2020   CREATININE 0.68 08/22/2020   CALCIUM 8.3 (L) 08/22/2020

## 2020-08-22 NOTE — TOC Progression Note (Signed)
Transition of Care [facility]) - Progression Note    Patient Details  Name: Carol Perez MRN: [id number] Date of Birth: 06-23-1940  Transition of Care [facility]) CM/SW [place], [place] Phone Number: 08/22/2020, 11:45 AM  Clinical Narrative:     Called LVM w/ [facility]([place]) of anticipated d/c tomorrow Started insurance authorization([employer]) reference # [id number]- patient will need authorization before she is discharged to SNF RN updated- requested covid and informed patient needs updated PT/OT notes   CSW will continue to follow and assist with discharge planning.  Thurmond Butts, MSW, LCSW Clinical Social Worker    Expected Discharge Plan: Skilled Nursing Facility Barriers to Discharge: SNF Pending bed offer,Continued Medical Work up,Insurance Authorization  Expected Discharge Plan and Services Expected Discharge Plan: [place] In-house Referral: Clinical Social Work     Living arrangements for the past 2 months: Single Family Home                                       Social Determinants of Health (SDOH) Interventions    Readmission Risk Interventions No flowsheet data found.

## 2020-08-22 NOTE — Progress Notes (Signed)
Subjective: Patient reports Patient doing well back pain leg pain well controlled  Objective: Vital signs in last 24 hours: Temp:  [98.4 F (36.9 C)-98.8 F (37.1 C)] 98.5 F (36.9 C) (03/13 0700) Pulse Rate:  [76-93] 76 (03/13 0400) Resp:  [8-16] 13 (03/13 0700) BP: (130-136)/(54-111) 134/56 (03/13 0700) SpO2:  [97 %-100 %] 100 % (03/13 0700)  Intake/Output from previous day: 03/12 0701 - 03/13 0700 In: 240 [P.O.:240] Out: 3700 [Urine:3700] Intake/Output this shift: No intake/output data recorded.  Awake alert oriented strength 5 out of 5  Lab Results: Recent Labs    08/21/20 0416 08/22/20 0415  WBC 18.5* 20.0*  HGB 8.3* 8.2*  HCT 24.9* 25.1*  PLT 462* 491*   BMET Recent Labs    08/21/20 0416 08/21/20 1259 08/22/20 0415  NA 131* 134* 135  K 4.3  --  4.2  CL 94*  --  98  CO2 29  --  30  GLUCOSE 180*  --  89  BUN 20  --  15  CREATININE 0.62  --  0.68  CALCIUM 8.7*  --  8.3*    Studies/Results: No results found.  Assessment/Plan: Continue to mobilize with physical and occupational therapy sodium improved to 135 patient should be stable for transfer to rehab tomorrow.  LOS: 10 days     Elaina Hoops 08/22/2020, 8:19 AM

## 2020-08-22 NOTE — Progress Notes (Signed)
Physical Therapy Treatment Patient Details Name: Carol Perez MRN: [id number] DOB: 12-10-1940 Today's Date: 08/22/2020    History of Present Illness 80 y.o. female presents on 08/12/2020 for L5-S1 ALIF and R L2-5 anterolateral lumbar interbody fusion due to low back pain with LLE radiculopathy. PMH includes ostropenia.    PT Comments    Pt progressing towards her physical therapy goals and demonstrates improved mentation. Session focused on seated exercises for BLE strengthening and functional mobility. Pt ambulating x 80 feet with a walker at a min guard assist level. Displays generalized weakness, balance deficits, decreased gait speed, and endurance. Continue to recommend SNF for ongoing Physical Therapy.      Follow Up Recommendations  SNF (per pt discharge plan is for short term rehab at [facility])     Equipment Recommendations  Rolling walker with 5" wheels    Recommendations for Other Services       Precautions / Restrictions Precautions Precautions: Fall;Back Precaution Booklet Issued: Yes (comment) Required Braces or Orthoses: Spinal Brace Spinal Brace: Lumbar corset;Applied in sitting position Restrictions Weight Bearing Restrictions: No    Mobility  Bed Mobility               General bed mobility comments: OOB in chair    Transfers Overall transfer level: Needs assistance Equipment used: Rolling walker (2 wheeled) Transfers: Sit to/from Stand Sit to Stand: Min guard         General transfer comment: Min guard for safety, cues for hand placement  Ambulation/Gait Ambulation/Gait assistance: Min guard Gait Distance (Feet): 80 Feet Assistive device: Rolling walker (2 wheeled) Gait Pattern/deviations: Step-through pattern;Decreased stride length;Trunk flexed     General Gait Details: Cues for activity pacing, upward gaze, upright posture. Min guard for balance. Tremulous towards end of walk   Stairs             Wheelchair  Mobility    Modified Rankin (Stroke Patients Only)       Balance Overall balance assessment: Needs assistance Sitting-balance support: Feet supported;No upper extremity supported Sitting balance-Leahy Scale: Good     Standing balance support: Bilateral upper extremity supported Standing balance-Leahy Scale: Poor Standing balance comment: reliant on bil UE support of RW                            Cognition Arousal/Alertness: Awake/alert Behavior During Therapy: WFL for tasks assessed/performed Overall Cognitive Status: Impaired/Different from baseline Area of Impairment: Memory;Safety/judgement                     Memory: Decreased recall of precautions;Decreased short-term memory                Exercises General Exercises - Lower Extremity Long Arc Quad: Strengthening;Both;Seated;15 reps Hip Flexion/Marching: Both;10 reps;Seated Heel Raises: Both;20 reps;Seated    General Comments        Pertinent Vitals/Pain Pain Assessment: Faces Faces Pain Scale: Hurts a little bit Pain Location: surgical site Pain Descriptors / Indicators: Aching;Guarding;Discomfort;Grimacing    Home Living                      Prior Function            PT Goals (current goals can now be found in the care plan section) Acute Rehab PT Goals Patient Stated Goal: get stronger PT Goal Formulation: With patient Time For Goal Achievement: 08/27/20 Potential to Achieve Goals: Good Progress  towards PT goals: Progressing toward goals    Frequency    Min 5X/week      PT Plan Current plan remains appropriate    Co-evaluation              AM-PAC PT "6 Clicks" Mobility   Outcome Measure  Help needed turning from your back to your side while in a flat bed without using bedrails?: None Help needed moving from lying on your back to sitting on the side of a flat bed without using bedrails?: A Little Help needed moving to and from a bed to a chair  (including a wheelchair)?: A Little Help needed standing up from a chair using your arms (e.g., wheelchair or bedside chair)?: A Little Help needed to walk in hospital room?: A Little Help needed climbing 3-5 steps with a railing? : A Lot 6 Click Score: 18    End of Session Equipment Utilized During Treatment: Back brace;Gait belt Activity Tolerance: Patient tolerated treatment well Patient left: with call bell/phone within reach;in chair;with chair alarm set Nurse Communication: Mobility status PT Visit Diagnosis: Other abnormalities of gait and mobility (R26.89);Muscle weakness (generalized) (M62.81);Pain;Unsteadiness on feet (R26.81);Difficulty in walking, not elsewhere classified (R26.2) Pain - part of body:  (back)     Time: 0051-1021 PT Time Calculation (min) (ACUTE ONLY): 17 min  Charges:  $Gait Training: 8-22 mins                     Wyona Almas, PT, DPT Acute Rehabilitation Services Pager [phone] Office [phone]    Deno Etienne 08/22/2020, 3:15 PM

## 2020-08-23 LAB — CBC
HCT: 26.2 % — ABNORMAL LOW (ref 36.0–46.0)
Hemoglobin: 8.6 g/dL — ABNORMAL LOW (ref 12.0–15.0)
MCH: 33.1 pg (ref 26.0–34.0)
MCHC: 32.8 g/dL (ref 30.0–36.0)
MCV: 100.8 fL — ABNORMAL HIGH (ref 80.0–100.0)
Platelets: 489 10*3/uL — ABNORMAL HIGH (ref 150–400)
RBC: 2.6 MIL/uL — ABNORMAL LOW (ref 3.87–5.11)
RDW: 14.2 % (ref 11.5–15.5)
WBC: 19.5 10*3/uL — ABNORMAL HIGH (ref 4.0–10.5)
nRBC: 0.2 % (ref 0.0–0.2)

## 2020-08-23 LAB — BASIC METABOLIC PANEL
Anion gap: 8 (ref 5–15)
BUN: 12 mg/dL (ref 8–23)
CO2: 27 mmol/L (ref 22–32)
Calcium: 8.7 mg/dL — ABNORMAL LOW (ref 8.9–10.3)
Chloride: 101 mmol/L (ref 98–111)
Creatinine, Ser: 0.64 mg/dL (ref 0.44–1.00)
GFR, Estimated: >60 mL/min (ref >60)
Glucose, Bld: 110 mg/dL — ABNORMAL HIGH (ref 70–99)
Potassium: 4.2 mmol/L (ref 3.5–5.1)
Sodium: 136 mmol/L (ref 135–145)

## 2020-08-23 LAB — MAGNESIUM: Magnesium: 1.9 mg/dL (ref 1.7–2.4)

## 2020-08-23 MED ORDER — METHOCARBAMOL 500 MG PO TABS: 500.0000 mg | ORAL_TABLET | Freq: Four times a day (QID) | ORAL | 2 refills | Status: DC | PRN

## 2020-08-23 MED ORDER — TRAMADOL HCL 50 MG PO TABS: 50.0000 mg | ORAL_TABLET | Freq: Four times a day (QID) | ORAL | 0 refills | Status: DC | PRN

## 2020-08-23 NOTE — Discharge Summary (Signed)
Physician Discharge Summary  Patient ID: Carol Perez MRN: [id number] DOB/AGE: [birth year] 80 y.o.  Admit date: 08/12/2020 Discharge date: 08/23/2020  Admission Diagnoses:  Lumbago with sciatica, lumbar scoliosis, degenerative lumbar stenosis, lumbar radiculopathy   Discharge Diagnoses:  Lumbago with sciatica, lumbar scoliosis, degenerative lumbar stenosis, lumbar radiculopathy  Active Problems:   Lumbar scoliosis   Hyponatremia   Discharged Condition: good  Hospital Course: The patient was admitted on 08/12/2020 and taken to the operating room where the patient underwent decompression and fusion. The patient tolerated the procedure well and was taken to the recovery room and then to the floor in stable condition. During the admission, the patient developed acute metabolic encephalopathy and hyponatremia. Internal medicine and nephrology were consulted. Patient returned to her baseline. The wound remained clean dry and intact. Pt had appropriate back soreness. No complaints of new N/T/W. The patient remained afebrile with stable vital signs, and tolerated a regular diet. The patient continued to increase activities, and pain was well controlled with oral pain medications. At this point she is stable from a surgical and medical standpoint and ready to be discharged to SNF.   Consults: nephrology and Internal Medicine   Significant Diagnostic Studies: radiology: X-ray  Treatments: surgery: Lumbar five Sacral one Anterior lumbar interbody fusion (N/A) Right Lumbar two-three, Lumbar three-four, Lumbar four-five Anterolateral lumbar interbody fusion (Right) Percutaneous pedicle screw fixation from Lumbar two to Sacral one  (N/A) ABDOMINAL EXPOSURE (N/A)  Discharge Exam: Blood pressure (!) 132/55, pulse 77, temperature 98 F (36.7 C), temperature source Oral, resp. rate (!) 7, height 5\' 2"  (1.575 m), weight 60.7 kg, SpO2 99 %.  Physical Exam: Patient is awake, A/O X 4, conversant, and in good  spirits. Speech is fluent and appropriate. Doing well. MAEW with good strength that is symmetric bilaterally. 5/5 BUE/BLE. Dressing is clean dry intact. Incision is well approximated with no drainage, erythema, or edema.    Disposition: Discharge disposition: 03-Skilled [place]      Patient's husband Rush Landmark) was given paper prescriptions for Tramadol and Robaxin. Continue to hold gabapentin and trazodone on discharge.  Repeat BMP in 1 week. If the sodium is improving.  Recommend to resume gabapentin but continue to hold trazodone.  Allergies as of 08/23/2020      Reactions   Banana Swelling   Swelling around mouth and eyes and red blotches   Latex Swelling, Rash, Other (See Comments)   Ciprofloxacin Hcl Other (See Comments)   Severe knee inflammation   Gatifloxacin Other (See Comments)   Severe knee inflammation   Levofloxacin Other (See Comments)   Severe knee inflammation   Moxifloxacin Other (See Comments)   Severe knee inflammation   Norfloxacin Other (See Comments)   Severe knee inflammation   Nsaids Other (See Comments)   Upsets ulcers   Ofloxacin Other (See Comments)   Severe knee inflammation   Other Other (See Comments)   FLOXIN   Penicillins Hives   Tolerates (KEFLEX-cephalexin)    Methylisothiazolinone Rash, Other (See Comments)   A preservative found in a cream pt used   Tape Rash      Medication List    STOP taking these medications   gabapentin 100 MG capsule Commonly known as: NEURONTIN   traZODone 50 MG tablet Commonly known as: DESYREL     TAKE these medications   aspirin EC 81 MG tablet Take 81 mg by mouth daily with breakfast.   B-complex with vitamin C tablet Take 1 tablet by mouth daily.  Biotin 5000 MCG Tabs Take 5,000 mcg by mouth daily.   CALCIUM PO Take 1,200 mg by mouth daily.   CYSTEX PO Take 100 mg by mouth daily. ( Cranberry Concentrate )   Glucosamine HCl 1500 MG Tabs Take 1,500 mg by mouth daily.    Loperamide-Simethicone 2-125 MG Tabs Take 1 tablet by mouth 4 (four) times daily as needed (diarrhea/loose stools).   methenamine 1 g tablet Commonly known as: HIPREX Take 1 g by mouth 2 (two) times daily with a meal. For UTI Prevention. Last UTI: October 20th, November 5th, and December 28th.   methocarbamol 500 MG tablet Commonly known as: ROBAXIN Take 1 tablet (500 mg total) by mouth every 6 (six) hours as needed for muscle spasms.   multivitamin with minerals Tabs tablet Take 1 tablet by mouth daily. One A Day for Women   NON FORMULARY CBD Full spectrum 1 ml as needed for sleep.   Premarin vaginal cream Generic drug: conjugated estrogens Place 1 Applicatorful vaginally 3 (three) times a week. What changed:   how much to take  additional instructions   PROBIOTIC COLON SUPPORT PO Take 1 tablet by mouth in the morning and at bedtime.   Systane 0.4-0.3 % Soln Generic drug: Polyethyl Glycol-Propyl Glycol Place 1 drop into both eyes as needed (dry/irritated eyes).   traMADol 50 MG tablet Commonly known as: Ultram Take 1 tablet (50 mg total) by mouth every 6 (six) hours as needed for severe pain.   valACYclovir 500 MG tablet Commonly known as: VALTREX TAKE (1) TABLET DAILY AS NEEDED. What changed: See the new instructions.   vitamin C 500 MG tablet Commonly known as: ASCORBIC ACID Take 500 mg by mouth daily.   Vitamin D3 50 MCG (2000 UT) Tabs Take 2,000 Units by mouth in the morning, at noon, and at bedtime.   ZEGERID PO Take 20 mg by mouth 2 (two) times daily.       Follow-up Information    Virgie Dad, MD. Schedule an appointment as soon as possible for a visit in 1 week(s).   Specialty: Internal Medicine Why: repeat BMP in 1 week.  Contact information: [place] [phone]               Signed: Marvis Moeller 08/23/2020, 8:57 AM

## 2020-08-23 NOTE — Plan of Care (Signed)
  Problem: Education: Goal: Knowledge of General Education information will improve Description: Including pain rating scale, medication(s)/side effects and non-pharmacologic comfort measures Outcome: Progressing   Problem: Health Behavior/Discharge Planning: Goal: Ability to manage health-related needs will improve Outcome: Progressing   Problem: Clinical Measurements: Goal: Will remain free from infection Outcome: Progressing Goal: Respiratory complications will improve Outcome: Progressing   Problem: Activity: Goal: Risk for activity intolerance will decrease Outcome: Progressing   Problem: Pain Managment: Goal: General experience of comfort will improve Outcome: Progressing   

## 2020-08-23 NOTE — TOC Progression Note (Signed)
Transition of Care [facility]) - Progression Note    Patient Details  Name: Carol Perez MRN: [id number] Date of Birth: April 09, 1941  Transition of Care [facility]) CM/SW [place], [place] Phone Number: 08/23/2020, 11:51 AM  Clinical Narrative:     Insurance authorization still pending.  Thurmond Butts, MSW, LCSW Clinical Social Worker   Expected Discharge Plan: Skilled Nursing Facility Barriers to Discharge: SNF Pending bed offer,Continued Medical Work up,Insurance Authorization  Expected Discharge Plan and Services Expected Discharge Plan: [place] In-house Referral: Clinical Social Work     Living arrangements for the past 2 months: Single Family Home Expected Discharge Date: 08/23/20                                     Social Determinants of Health (SDOH) Interventions    Readmission Risk Interventions No flowsheet data found.

## 2020-08-23 NOTE — Progress Notes (Signed)
Subjective: Patient reports "I am feeling back to myself now". She does have some complaints of left quadricept pain that is decreased with ambulation and analgesics. Overall, she feels as thought she is improving and pleased with her post surgical results. She is awaiting transfer to rehab.   Objective: Vital signs in last 24 hours: Temp:  [98 F (36.7 C)-99 F (37.2 C)] 98.8 F (37.1 C) (03/14 0400) Pulse Rate:  [75-87] 82 (03/14 0000) Resp:  [7-22] 7 (03/14 0400) BP: (117-141)/(51-66) 132/55 (03/14 0400) SpO2:  [95 %-100 %] 100 % (03/14 0000)  Intake/Output from previous day: 03/13 0701 - 03/14 0700 In: 480 [P.O.:480] Out: 1500 [Urine:1500] Intake/Output this shift: No intake/output data recorded.  Physical Exam: Patient is in NAD, awake, A/O X 4, conversant, and in good spirits. Speech is fluent and appropriate. Doing well. MAEW with good strength that is symmetric bilaterally. 5/5 BUE/BLE. Dressing is clean dry intact. Incision is well approximated with no drainage, erythema, or edema.   Lab Results: Recent Labs    08/22/20 0415 08/23/20 0158  WBC 20.0* 19.5*  HGB 8.2* 8.6*  HCT 25.1* 26.2*  PLT 491* 489*   BMET Recent Labs    08/22/20 0415 08/23/20 0158  NA 135 136  K 4.2 4.2  CL 98 101  CO2 30 27  GLUCOSE 89 110*  BUN 15 12  CREATININE 0.68 0.64  CALCIUM 8.3* 8.7*    Studies/Results: No results found.  Assessment/Plan: Patient is post-op s/p L5/S1 ALIF with right L2-L5 XLIF with percutaneous pedicle screws at L2-S1. She is recovering well and reports a resolution of her preoperative symptoms. Hyponatremia continues to be resolved, Na 136 this morning. Does have some complaints of left quad pain that improves with analgesics and mobility. Continue working with PT/OT. Continue TLSO brace when OOB. Continue working on pain control, mobility and ambulating patient. Plan to discharge patient to [facility] today.     LOS: 11 days     Marvis Moeller, DNP, NP-C 08/23/2020, 7:44 AM

## 2020-08-23 NOTE — Progress Notes (Signed)
Physical Therapy Treatment Patient Details Name: Carol Perez MRN: [id number] DOB: 1940-12-02 Today's Date: 08/23/2020    History of Present Illness 80 y.o. female presents on 08/12/2020 for L5-S1 ALIF and R L2-5 anterolateral lumbar interbody fusion due to low back pain with LLE radiculopathy. PMH includes ostropenia.    PT Comments    Pt progressing steadily towards her physical therapy goals. Pt reports improved pain control; continued mild left quadriceps pain. Ambulating x 120 feet with a walker at a min guard assist level. Demonstrates balance deficits, weakness, and decreased endurance. Continue to recommend SNF for ongoing Physical Therapy.     Follow Up Recommendations  SNF (per pt discharge plan is for short term rehab at [facility])     Equipment Recommendations  Rolling walker with 5" wheels    Recommendations for Other Services       Precautions / Restrictions Precautions Precautions: Fall;Back Precaution Booklet Issued: Yes (comment) Required Braces or Orthoses: Spinal Brace Spinal Brace: Lumbar corset;Applied in sitting position Restrictions Weight Bearing Restrictions: No    Mobility  Bed Mobility Overal bed mobility: Needs Assistance Bed Mobility: Sit to Supine       Sit to supine: Min assist   General bed mobility comments: MinA for LE negotiation back into bed    Transfers Overall transfer level: Needs assistance Equipment used: Rolling walker (2 wheeled) Transfers: Sit to/from Stand Sit to Stand: Supervision         General transfer comment: Cues for hand placement  Ambulation/Gait Ambulation/Gait assistance: Min guard Gait Distance (Feet): 120 Feet Assistive device: Rolling walker (2 wheeled) Gait Pattern/deviations: Step-through pattern;Decreased stride length;Trunk flexed     General Gait Details: Cues for upward gaze, increased bilateral foot clearance. Pt tremulous towards end of walk.   Stairs              Wheelchair Mobility    Modified Rankin (Stroke Patients Only)       Balance Overall balance assessment: Needs assistance Sitting-balance support: Feet supported;No upper extremity supported Sitting balance-Leahy Scale: Good     Standing balance support: Bilateral upper extremity supported Standing balance-Leahy Scale: Poor Standing balance comment: reliant on bil UE support of RW                            Cognition Arousal/Alertness: Awake/alert Behavior During Therapy: WFL for tasks assessed/performed Overall Cognitive Status: Impaired/Different from baseline Area of Impairment: Memory;Safety/judgement                     Memory: Decreased recall of precautions;Decreased short-term memory                Exercises      General Comments        Pertinent Vitals/Pain Pain Assessment: Faces Faces Pain Scale: Hurts a little bit Pain Location: surgical site Pain Descriptors / Indicators: Aching;Guarding;Discomfort;Grimacing Pain Intervention(s): Monitored during session    Home Living                      Prior Function            PT Goals (current goals can now be found in the care plan section) Acute Rehab PT Goals Patient Stated Goal: get stronger PT Goal Formulation: With patient Time For Goal Achievement: 08/27/20 Potential to Achieve Goals: Good Progress towards PT goals: Progressing toward goals    Frequency    Min 5X/week  PT Plan Current plan remains appropriate    Co-evaluation              AM-PAC PT "6 Clicks" Mobility   Outcome Measure  Help needed turning from your back to your side while in a flat bed without using bedrails?: None Help needed moving from lying on your back to sitting on the side of a flat bed without using bedrails?: A Little Help needed moving to and from a bed to a chair (including a wheelchair)?: A Little Help needed standing up from a chair using your arms (e.g.,  wheelchair or bedside chair)?: None Help needed to walk in hospital room?: A Little Help needed climbing 3-5 steps with a railing? : A Lot 6 Click Score: 19    End of Session Equipment Utilized During Treatment: Back brace;Gait belt Activity Tolerance: Patient tolerated treatment well Patient left: with call bell/phone within reach;in bed Nurse Communication: Mobility status PT Visit Diagnosis: Other abnormalities of gait and mobility (R26.89);Muscle weakness (generalized) (M62.81);Pain;Unsteadiness on feet (R26.81);Difficulty in walking, not elsewhere classified (R26.2) Pain - part of body:  (back)     Time: 0981-1914 PT Time Calculation (min) (ACUTE ONLY): 25 min  Charges:  $Gait Training: 8-22 mins $Therapeutic Activity: 8-22 mins                     Wyona Almas, PT, DPT Acute Rehabilitation Services Pager [phone] Office [phone]    Deno Etienne 08/23/2020, 3:37 PM

## 2020-08-23 NOTE — TOC Transition Note (Signed)
Transition of Care [facility]) - CM/SW Discharge Note   Patient Details  Name: SHARIAN DELIA MRN: [id number] Date of Birth: 11-May-1941  Transition of Care [facility]) CM/SW Contact:  Vinie Sill, [place] Phone Number: 08/23/2020, 3:06 PM   Clinical Narrative:     Patient will Discharge to: [facility] Discharge Date:08/23/2020 Family Notified:patient declined Transport By: Corey Harold  Please make sure prescription(tramadol) goes with the patient.  Per MD patient is ready for discharge. RN, patient, and facility notified of DC. Discharge Summary sent to facility. RN given number for report([phone]). Ambulance transport requested for patient.   Clinical Social Worker signing off. Thurmond Butts, MSW, LCSW Clinical Social Worker    Final next level of care: Skilled Nursing Facility Barriers to Discharge: Barriers Resolved   Patient Goals and CMS Choice        Discharge Placement PASRR number recieved: 08/16/20            Patient chooses bed at: [facility] Patient to be transferred to facility by: [place] Name of family member notified: patient declined Patient and family notified of of transfer: 08/23/20  Discharge Plan and Services In-house Referral: Clinical Social Work                                   Social Determinants of Health (SDOH) Interventions     Readmission Risk Interventions No flowsheet data found.

## 2020-08-24 ENCOUNTER — Encounter: Payer: Self-pay | Admitting: Nurse Practitioner

## 2020-08-24 ENCOUNTER — Non-Acute Institutional Stay (SKILLED_NURSING_FACILITY): Payer: Medicare Other | Admitting: Nurse Practitioner

## 2020-08-24 DIAGNOSIS — F339 Major depressive disorder, recurrent, unspecified: Secondary | ICD-10-CM | POA: Insufficient documentation

## 2020-08-24 DIAGNOSIS — N952 Postmenopausal atrophic vaginitis: Secondary | ICD-10-CM

## 2020-08-24 DIAGNOSIS — D500 Iron deficiency anemia secondary to blood loss (chronic): Secondary | ICD-10-CM

## 2020-08-24 DIAGNOSIS — E871 Hypo-osmolality and hyponatremia: Secondary | ICD-10-CM

## 2020-08-24 DIAGNOSIS — M5440 Lumbago with sciatica, unspecified side: Secondary | ICD-10-CM

## 2020-08-24 NOTE — Assessment & Plan Note (Signed)
Hyponatremia, underwent nephrology consultation, Na 136 08/23/20, update CMP/eGFR one week.

## 2020-08-24 NOTE — Progress Notes (Signed)
Location:    [facility] Room Number: 27 Place of Service:  SNF (31) Provider: Lennie Odor Olney Monier NP  Virgie Dad, MD  Patient Care Team: Virgie Dad, MD as PCP - General (Internal Medicine) Rozetta Nunnery, MD as Consulting Physician (Otolaryngology) Luberta Mutter, MD as Consulting Physician (Ophthalmology)  Extended Emergency Contact Information Primary Emergency Contact: Karam,William M Address: [address] [place]          [place], [place] [address] Johnnette Litter of [place] Phone: [phone] Mobile Phone: [phone] Relation: Spouse Secondary Emergency Contact: Reeves,Pam    POA Address: [place], [place] [address] Johnnette Litter of [place] Phone: [phone] Relation: Niece Preferred language: English Interpreter needed? No  Code Status:  DNR Goals of care: Advanced Directive information Advanced Directives 08/25/2020  Does Patient Have a Medical Advance Directive? Yes  Type of Paramedic of [place];Living will  Does patient want to make changes to medical advance directive? -  Copy of [place] in Chart? No - copy requested  Would patient like information on creating a medical advance directive? -     Chief Complaint  Patient presents with  . Medical Management of Chronic Issues  . Health Maintenance    PPSV23, TDAP, Influenza vaccine, Covid booster, Hep. C    HPI:  Pt is a 80 y.o. female seen today for an acute visit for med review following hospital stay.   Hospitalized 08/12/20-08/23/20 for decompression and fusion for lumbago with sciatica, lumbar scoliosis, degenerative lumbar stenosis 08/12/20. Her post op recover was complicated with acute metabolic encephalopathy and hyponatremia, Gabapentin, Trazodone held, resolved at baseline of mentation upon discharge. Tramadol, Methocarbamol are effective.   Hyponatremia, underwent nephrology  consultation, Na 136 08/23/20  Depression desires trying Lexapro, TSH 1.837 08/17/20  Atrophic vaginitis, uses Premarin vaginal cream.   Post Op anemia, Hgb 8.6 08/23/20  ,    Past Medical History:  Diagnosis Date  . Barrett's esophagus   . Colitis   . DDD (degenerative disc disease)   . Erroneous encounter - disregard    error   . Erroneous encounter - disregard   . Erroneous encounter - disregard   . Factor 5 Leiden mutation, heterozygous [facility])    Per [facility] Patient Packet.  . Frequent UTI   . H/O echocardiogram 04/01/2019   Per [facility] Patient Packet.  . Hiatal hernia    Per [facility] Patient Packet.  . High grade dysplasia of Barrett's epithelium    Per [facility] New Patient Packet.  Marland Kitchen History of bladder infections    Per [facility] New Patient Packet.  Marland Kitchen History of bone density study 07/12/2018   Per [facility] Patient Packet.  Marland Kitchen History of bone density study 07/12/2018   Up coming appointment 07/17/2019, Per [facility] New Patient Packet  . History of bone density study 07/17/2019  . History of endoscopy 12/17/2015   Needed Every 3 years. Scheduled for January 2021. Per [facility] New Patient Packet.   Marland Kitchen History of mammogram 04/14/2019   Per [facility] Patient Packet.  Marland Kitchen History of MRI 03/01/2019   By Dr.Gulati/ Neurologist. MRI of Brain. Per [facility] New Patient Packet.  Marland Kitchen History of MRI 04/23/2019   By Dr. Maryan Rued at [facility]. Per [facility] New Patient Packet.  Marland Kitchen History of Papanicolaou smear of cervix 10/16/2011   Per [facility] Patient Packet  .  Hypotension    Per [facility] New Patient Packet.  . IBS (irritable bowel syndrome)    Per [facility] New Patient Packet.  . Lactose intolerance   . Osteoarthritis    Per [facility] New Patient Packet.  Marland Kitchen Osteopenia    Per [facility] New Patient Packet.  . Osteoporosis   . Premature ventricular complex   . Raynaud's disease    Per [facility] New Patient Packet.  . Scoliosis    Per [facility] New Patient Packet.   Past Surgical History:  Procedure Laterality Date  . ABDOMINAL EXPOSURE  N/A 08/12/2020   Procedure: ABDOMINAL EXPOSURE;  Surgeon: Serafina Mitchell, MD;  Location: [facility];  Service: Vascular;  Laterality: N/A;  . ABDOMINAL HYSTERECTOMY  06/13/2011   By Dr.Scherer at [facility]. Per [facility] New Patient Packet.  . ANTERIOR LAT LUMBAR FUSION Right 08/12/2020   Procedure: Right Lumbar two-three, Lumbar three-four, Lumbar four-five Anterolateral lumbar interbody fusion;  Surgeon: Erline Levine, MD;  Location: [facility];  Service: Neurosurgery;  Laterality: Right;  . ANTERIOR LUMBAR FUSION N/A 08/12/2020   Procedure: Lumbar five Sacral one Anterior lumbar interbody fusion;  Surgeon: Erline Levine, MD;  Location: [facility];  Service: Neurosurgery;  Laterality: N/A;  . BREAST EXCISIONAL BIOPSY Left 30+ yrs ago  . COLONOSCOPY  11/23/2011   By Dr.McCune at [facility]. Per [facility] Patient Packet.  . COLONOSCOPY  08/26/2019  . LUMBAR PERCUTANEOUS PEDICLE SCREW 4 LEVEL N/A 08/12/2020   Procedure: Percutaneous pedicle screw fixation from Lumbar two to Sacral one ;  Surgeon: Erline Levine, MD;  Location: [facility];  Service: Neurosurgery;  Laterality: N/A;  . MAMMOGRAM  04/14/2019  . pap smear  10/16/2011  . SIGMOIDOSCOPY  11/02/2014   Per [facility] New Patient Packet  . TONSILLECTOMY  06/12/1946   Per [facility] New Patient Packet.    Allergies  Allergen Reactions  . Banana Swelling    Swelling around mouth and eyes and red blotches  . Latex Swelling, Rash and Other (See Comments)  . Ciprofloxacin Hcl Other (See Comments)    Severe knee inflammation   . Gatifloxacin Other (See Comments)    Severe knee inflammation   . Levofloxacin Other (See Comments)    Severe knee inflammation   . Moxifloxacin Other (See Comments)    Severe knee inflammation   . Norfloxacin Other (See Comments)    Severe knee inflammation   . Nsaids Other (See Comments)    Upsets ulcers  . Ofloxacin Other (See Comments)    Severe knee inflammation  . Other Other (See Comments)    FLOXIN  .  Penicillins Hives    Tolerates (KEFLEX-cephalexin)   . Methylisothiazolinone Rash and Other (See Comments)    A preservative found in a cream pt used  . Tape Rash    Allergies as of 08/24/2020      Reactions   Banana Swelling   Swelling around mouth and eyes and red blotches   Latex Swelling, Rash, Other (See Comments)   Ciprofloxacin Hcl Other (See Comments)   Severe knee inflammation   Gatifloxacin Other (See Comments)   Severe knee inflammation   Levofloxacin Other (See Comments)   Severe knee inflammation   Moxifloxacin Other (See Comments)   Severe knee inflammation   Norfloxacin Other (See Comments)   Severe knee inflammation   Nsaids Other (See Comments)   Upsets ulcers   Ofloxacin Other (See Comments)   Severe knee inflammation   Other Other (See Comments)   FLOXIN  Penicillins Hives   Tolerates (KEFLEX-cephalexin)    Methylisothiazolinone Rash, Other (See Comments)   A preservative found in a cream pt used   Tape Rash      Medication List       Accurate as of August 24, 2020 11:59 PM. If you have any questions, ask your nurse or doctor.        aspirin EC 81 MG tablet Take 81 mg by mouth daily with breakfast.   B-complex with vitamin C tablet Take 1 tablet by mouth daily.   Biotin 5000 MCG Tabs Take 5,000 mcg by mouth daily.   CALCIUM PO Take 1,200 mg by mouth daily.   CYSTEX PO Take 100 mg by mouth daily. ( Cranberry Concentrate )   escitalopram 5 MG tablet Commonly known as: LEXAPRO Take 5 mg by mouth daily.   Glucosamine HCl 1500 MG Tabs Take 1,500 mg by mouth daily.   Loperamide-Simethicone 2-125 MG Tabs Take 1 tablet by mouth 4 (four) times daily as needed (diarrhea/loose stools).   methenamine 1 g tablet Commonly known as: HIPREX Take 1 g by mouth 2 (two) times daily with a meal. For UTI Prevention. Last UTI: October 20th, November 5th, and December 28th.   methocarbamol 500 MG tablet Commonly known as: ROBAXIN Take 1 tablet (500  mg total) by mouth every 6 (six) hours as needed for muscle spasms.   multivitamin with minerals Tabs tablet Take 1 tablet by mouth daily. One A Day for Women   NON FORMULARY CBD Full spectrum 1 ml as needed for sleep.   Premarin vaginal cream Generic drug: conjugated estrogens Place 1 Applicatorful vaginally 3 (three) times a week. What changed:   how much to take  additional instructions   PROBIOTIC COLON SUPPORT PO Take 1 tablet by mouth in the morning and at bedtime.   Systane 0.4-0.3 % Soln Generic drug: Polyethyl Glycol-Propyl Glycol Place 1 drop into both eyes as needed (dry/irritated eyes).   traMADol 50 MG tablet Commonly known as: Ultram Take 1 tablet (50 mg total) by mouth every 6 (six) hours as needed for severe pain.   Tubersol 5 UNIT/0.1ML injection Generic drug: tuberculin Inject 5 Units into the skin once.   valACYclovir 500 MG tablet Commonly known as: VALTREX TAKE (1) TABLET DAILY AS NEEDED. What changed: See the new instructions.   vitamin C 500 MG tablet Commonly known as: ASCORBIC ACID Take 500 mg by mouth daily.   Vitamin D3 50 MCG (2000 UT) Tabs Take 2,000 Units by mouth in the morning, at noon, and at bedtime.   ZEGERID PO Take 20 mg by mouth 2 (two) times daily.   zinc oxide 20 % ointment Apply 1 application topically as needed for irritation.       Review of Systems  Constitutional: Positive for activity change, appetite change and fatigue. Negative for chills, diaphoresis and fever.  HENT: Positive for hearing loss. Negative for congestion, trouble swallowing and voice change.   Respiratory: Negative for cough, shortness of breath and wheezing.   Cardiovascular: Negative for chest pain, palpitations and leg swelling.  Gastrointestinal: Negative for abdominal pain, constipation, nausea and vomiting.  Genitourinary: Negative for dysuria, frequency and urgency.  Musculoskeletal: Positive for arthralgias, back pain and gait problem.   Skin: Positive for color change and wound.  Neurological: Negative for speech difficulty, weakness, light-headedness and headaches.  Psychiatric/Behavioral: Positive for dysphoric mood and sleep disturbance. Negative for behavioral problems, confusion and hallucinations. The patient is not nervous/anxious.  Immunization History  Administered Date(s) Administered  . Influenza, High Dose Seasonal PF 03/26/2019  . Moderna Sars-Covid-2 Vaccination 06/16/2019, 07/14/2019  . Pneumococcal Conjugate-13 12/12/2014  . Tdap 04/08/2008  . Zoster Recombinat (Shingrix) 06/12/2017   Pertinent  Health Maintenance Due  Topic Date Due  . PNA vac Low Risk Adult (2 of 2 - PPSV23) 12/12/2015  . INFLUENZA VACCINE  01/11/2020  . DEXA SCAN  Completed   Fall Risk  05/12/2020 02/11/2020 12/17/2019 11/12/2019 08/15/2019  Falls in the past year? 0 0 0 0 0  Number falls in past yr: 0 0 0 0 0  Injury with Fall? - - - - 0   Functional Status Survey:    Vitals:   08/24/20 1440  BP: (!) 141/75  Pulse: 85  Resp: 16  Temp: 97.7 F (36.5 C)  SpO2: 97%  Weight: 130 lb 3.2 oz (59.1 kg)  Height: _0  (1.575 m)   Body mass index is 23.81 kg/m. Physical Exam Vitals and nursing note reviewed.  Constitutional:      General: She is not in acute distress.    Appearance: Normal appearance. She is not ill-appearing, toxic-appearing or diaphoretic.  HENT:     Head: Normocephalic and atraumatic.     Nose: Nose normal.     Mouth/Throat:     Mouth: Mucous membranes are moist.  Eyes:     Extraocular Movements: Extraocular movements intact.     Conjunctiva/sclera: Conjunctivae normal.     Pupils: Pupils are equal, round, and reactive to light.  Cardiovascular:     Rate and Rhythm: Normal rate and regular rhythm.     Heart sounds: No murmur heard.   Pulmonary:     Effort: Pulmonary effort is normal.     Breath sounds: No wheezing or rales.  Abdominal:     General: Bowel sounds are normal.     Palpations:  Abdomen is soft.     Tenderness: There is no abdominal tenderness. There is no right CVA tenderness, left CVA tenderness or guarding.  Musculoskeletal:        General: Tenderness present.     Cervical back: Normal range of motion and neck supple.     Right lower leg: No edema.     Left lower leg: No edema.     Comments: Back brace, pain with movement.   Skin:    General: Skin is warm and dry.     Findings: Bruising present.     Comments: Multiple ecchymoses mid to lower back, flanks from surgical procedures. Several honey comb dressing intact-should be self exfoliating.   Neurological:     General: No focal deficit present.     Mental Status: She is alert and oriented to person, place, and time. Mental status is at baseline.  Psychiatric:        Mood and Affect: Mood normal.        Behavior: Behavior normal.        Thought Content: Thought content normal.        Judgment: Judgment normal.     Labs reviewed: Recent Labs    08/21/20 0416 08/21/20 1259 08/22/20 0415 08/23/20 0158  NA 131* 134* 135 136  K 4.3  --  4.2 4.2  CL 94*  --  98 101  CO2 29  --  30 27  GLUCOSE 180*  --  89 110*  BUN 20  --  15 12  CREATININE 0.62  --  0.68 0.64  CALCIUM 8.7*  --  8.3* 8.7*  MG 1.7  --  1.7 1.9   Recent Labs    08/29/19 0826 11/19/19 0929  AST 22 20  ALT 17 15  BILITOT 0.5 0.4  PROT 6.8 5.9*   Recent Labs    08/29/19 0826 09/02/19 1016 11/19/19 0929 08/09/20 1530 08/21/20 0416 08/22/20 0415 08/23/20 0158  WBC 6.3 7.0 6.2   < > 18.5* 20.0* 19.5*  NEUTROABS 3,484 4.4 3,422  --   --   --   --   HGB 13.8 13.5 12.9   < > 8.3* 8.2* 8.6*  HCT 41.8 42.1 38.7   < > 24.9* 25.1* 26.2*  MCV 95.2 99.8 99.2   < > 99.2 100.4* 100.8*  PLT 374 307 270   < > 462* 491* 489*   < > = values in this interval not displayed.   Lab Results  Component Value Date   TSH 1.837 08/17/2020   No results found for: HGBA1C Lab Results  Component Value Date   CHOL 228 (H) 08/29/2019   HDL  93 08/29/2019   LDLCALC 112 (H) 08/29/2019   TRIG 115 08/29/2019   CHOLHDL 2.5 08/29/2019    Significant Diagnostic Results in last 30 days:  DG Lumbar Spine 2-3 Views  Result Date: 08/12/2020 CLINICAL DATA:  Lumbar surgery. EXAM: LUMBAR SPINE - 2-3 VIEW; DG C-ARM 1-60 MIN COMPARISON:  Prior study same day.  MRI 03/04/2020. FINDINGS: Lumbar spine numbered as per prior MRI. Postsurgical changes noted of the lumbosacral spine with L2 through S1 posterior and interbody fusion. L5-S1 anterior fusion. 3 minutes 47 seconds fluoroscopy time. 69.46 mGy. IMPRESSION: Postsurgical changes lumbosacral spine. Electronically Signed   By: Marcello Moores  Register   On: 08/12/2020 16:31   DG Lumbar Spine 2-3 Views  Result Date: 08/12/2020 CLINICAL DATA:  Lumbar surgery. EXAM: LUMBAR SPINE - 2-3 VIEW COMPARISON:  MRI 03/04/2020. FINDINGS: Postsurgical changes lumbar spine with interbody fusion noted. Lower lumbar fusion present. Visualized hardware intact. Anatomic alignment. Scoliosis and diffuse degenerative change. No acute bony abnormality. IMPRESSION: Postsurgical changes lumbar spine. Electronically Signed   By: Marcello Moores  Register   On: 08/12/2020 13:01   DG CHEST PORT 1 VIEW  Result Date: 08/16/2020 CLINICAL DATA:  Short of breath, productive cough EXAM: PORTABLE CHEST 1 VIEW COMPARISON:  10/13/2004 FINDINGS: Hypoventilation with decreased lung volume. Left lower lobe consolidation, moderate. Mild right lower lobe airspace disease. No edema or effusion. IMPRESSION: Bibasilar airspace disease left greater than right. Possible atelectasis versus pneumonia. Electronically Signed   By: Franchot Gallo M.D.   On: 08/16/2020 11:52   DG C-Arm 1-60 Min  Result Date: 08/12/2020 CLINICAL DATA:  Lumbar surgery. EXAM: LUMBAR SPINE - 2-3 VIEW; DG C-ARM 1-60 MIN COMPARISON:  Prior study same day.  MRI 03/04/2020. FINDINGS: Lumbar spine numbered as per prior MRI. Postsurgical changes noted of the lumbosacral spine with L2 through S1  posterior and interbody fusion. L5-S1 anterior fusion. 3 minutes 47 seconds fluoroscopy time. 69.46 mGy. IMPRESSION: Postsurgical changes lumbosacral spine. Electronically Signed   By: Marcello Moores  Register   On: 08/12/2020 16:31   DG OR LOCAL ABDOMEN  Addendum Date: 08/12/2020   ADDENDUM REPORT: 08/12/2020 13:23 ADDENDUM: Surgical clip noted over the right abdomen. No retained surgical instruments noted. Electronically Signed   By: Marcello Moores  Register   On: 08/12/2020 13:23   Result Date: 08/12/2020 CLINICAL DATA:  Evaluation for retained instrument. EXAM: OR LOCAL ABDOMEN COMPARISON:  No recent prior. FINDINGS: Right lateral and upper abdomen not completely imaged.  Soft tissue structures are unremarkable. No evidence of retained surgical instrument. Prior lumbosacral spine fusion. Pelvic calcification consistent phleboliths. Degenerative changes and scoliosis lumbar spine. Degenerative changes both hips. IMPRESSION: No retained surgical instrument noted. Report phoned to the operating room at the time of the study. Electronically Signed: By: Marcello Moores  Register On: 08/12/2020 10:48    Assessment/Plan Hyponatremia Hyponatremia, underwent nephrology consultation, Na 136 08/23/20, update CMP/eGFR one week.    Depression, recurrent ([place]) Stopped Trazodone in hospital due to hyponatremia, desire trying Lexapro 5 mg qd.   Atrophic vaginitis Atrophic vaginitis, uses Premarin vaginal cream.   Blood loss anemia Post Op anemia, Hgb 8.6 08/23/20, update CBC/diff.    Back pain pression and fusion for lumbago with sciatica, lumbar scoliosis, degenerative lumbar stenosis 08/12/20. Her post op recover was complicated with acute metabolic encephalopathy and hyponatremia, Gabapentin, Trazodone held, resolved at baseline of mentation upon discharge. Tramadol, Methocarbamol are effective.     Family/ staff Communication: plan of care reviewed with the patient and charge nurse.   Labs/tests ordered:  CBC/diff, CMP/eGFR  one week  Time spend 35 minutes.

## 2020-08-24 NOTE — Progress Notes (Signed)
Patient transferred to [facility] with [facility] transporting representative. Patient called and notified her husband of transport.

## 2020-08-24 NOTE — Assessment & Plan Note (Signed)
Post Op anemia, Hgb 8.6 08/23/20, update CBC/diff.

## 2020-08-24 NOTE — Assessment & Plan Note (Signed)
Atrophic vaginitis, uses Premarin vaginal cream.

## 2020-08-24 NOTE — Assessment & Plan Note (Signed)
Stopped Trazodone in hospital due to hyponatremia, desire trying Lexapro 5 mg qd.

## 2020-08-24 NOTE — Assessment & Plan Note (Signed)
pression and fusion for lumbago with sciatica, lumbar scoliosis, degenerative lumbar stenosis 08/12/20. Her post op recover was complicated with acute metabolic encephalopathy and hyponatremia, Gabapentin, Trazodone held, resolved at baseline of mentation upon discharge. Tramadol, Methocarbamol are effective.

## 2020-08-25 ENCOUNTER — Encounter: Payer: Self-pay | Admitting: Nurse Practitioner

## 2020-08-25 ENCOUNTER — Encounter: Payer: Self-pay | Admitting: Internal Medicine

## 2020-08-25 ENCOUNTER — Non-Acute Institutional Stay (SKILLED_NURSING_FACILITY): Payer: Medicare Other | Admitting: Internal Medicine

## 2020-08-25 ENCOUNTER — Ambulatory Visit: Payer: Medicare Other | Admitting: Family Medicine

## 2020-08-25 DIAGNOSIS — F339 Major depressive disorder, recurrent, unspecified: Secondary | ICD-10-CM

## 2020-08-25 DIAGNOSIS — D500 Iron deficiency anemia secondary to blood loss (chronic): Secondary | ICD-10-CM

## 2020-08-25 DIAGNOSIS — N390 Urinary tract infection, site not specified: Secondary | ICD-10-CM | POA: Diagnosis not present

## 2020-08-25 DIAGNOSIS — E871 Hypo-osmolality and hyponatremia: Secondary | ICD-10-CM | POA: Diagnosis not present

## 2020-08-25 DIAGNOSIS — M549 Dorsalgia, unspecified: Secondary | ICD-10-CM

## 2020-08-25 DIAGNOSIS — Z9889 Other specified postprocedural states: Secondary | ICD-10-CM

## 2020-08-25 DIAGNOSIS — K52832 Lymphocytic colitis: Secondary | ICD-10-CM

## 2020-08-25 MED ORDER — LORAZEPAM 0.5 MG PO TABS: 0.2500 mg | ORAL_TABLET | Freq: Four times a day (QID) | ORAL | 1 refills | Status: AC | PRN

## 2020-08-25 MED ORDER — HYDROCODONE-ACETAMINOPHEN 5-325 MG PO TABS: 1.0000 | ORAL_TABLET | ORAL | 0 refills | Status: DC | PRN

## 2020-08-25 NOTE — Progress Notes (Signed)
Provider:  Veleta Miners MD Location:    [facility] Room Number: 27 Place of Service:  SNF (31)  PCP: Virgie Dad, MD Patient Care Team: Virgie Dad, MD as PCP - General (Internal Medicine) Rozetta Nunnery, MD as Consulting Physician (Otolaryngology) Luberta Mutter, MD as Consulting Physician (Ophthalmology)  Extended Emergency Contact Information Primary Emergency Contact: Covino,William M Address: [address] [place]          [place], [place] [address] Johnnette Litter of [place] Phone: [phone] Mobile Phone: [phone] Relation: Spouse Secondary Emergency Contact: Reeves,Pam    POA Address: [place],  [address] Johnnette Litter of [place] Phone: [phone] Relation: Niece Preferred language: English Interpreter needed? No  Code Status: Full Code Managed Care Goals of Care: Advanced Directive information Advanced Directives 08/25/2020  Does Patient Have a Medical Advance Directive? Yes  Type of Paramedic of [place];Living will  Does patient want to make changes to medical advance directive? -  Copy of [place] in Chart? No - copy requested  Would patient like information on creating a medical advance directive? -      Chief Complaint  Patient presents with  . New Admit To SNF    Admission to SNF    HPI: Patient is a 80 y.o. female seen today for admission to SNF for  Therapy   Patient was admitted in the hospital from 3/3-3/14 underwent decompression and fusion of her lumbar spine.  2-3, 3-4, 4-5  Patient has a history of  recurrent UTI prophylaxis with Hip-Rex History of depression, h/o osteoporosis History of lymphocytic colitis was not able to tolerate budesonide History of anxiety attacks versus TIA Previous work-up with neurology including MRI. EEG has been negative  Was electively admitted for lumbar spine fusion for  lumbar scoliosis and stenosis Postop was complicated with acute metabolic encephalopathy and hyponatremia Her hyponatremia was thought to be due to poor p.o. intake. Her trazodone and gabapentin were stopped to see  reason for SIADH.  She was treated with hypertonic saline Her mentation improved. Anemia was thought to be due to dilution and did improve  In the facility patient had a very bad night due to pain and anxiety.  .  She states the pain is mostly in her back going down her leg.  Also having problems with anxiety and depression Her mental status is back to baseline.  Already walking with her walker though dragging her right foot. Also complaining cough dysuria, frequency.  No abdominal pain nausea or fever   Past Medical History:  Diagnosis Date  . Barrett's esophagus   . Colitis   . DDD (degenerative disc disease)   . Erroneous encounter - disregard    error   . Erroneous encounter - disregard   . Erroneous encounter - disregard   . Factor 5 Leiden mutation, heterozygous [facility])    Per [facility] Patient Packet.  . Frequent UTI   . H/O echocardiogram 04/01/2019   Per [facility] Patient Packet.  . Hiatal hernia    Per [facility] Patient Packet.  . High grade dysplasia of Barrett's epithelium    Per [facility] New Patient Packet.  Marland Kitchen History of bladder infections    Per [facility] New Patient Packet.  Marland Kitchen History of bone density study 07/12/2018   Per [facility] Patient Packet.  Marland Kitchen History of bone density study 07/12/2018  Up coming appointment 07/17/2019, Per [facility] New Patient Packet  . History of bone density study 07/17/2019  . History of endoscopy 12/17/2015   Needed Every 3 years. Scheduled for January 2021. Per [facility] New Patient Packet.   Marland Kitchen History of mammogram 04/14/2019   Per [facility] Patient Packet.  Marland Kitchen History of MRI 03/01/2019   By Dr.Gulati/ Neurologist. MRI of Brain. Per [facility] New Patient Packet.  Marland Kitchen History of MRI 04/23/2019   [facility] Maryan Rued at [facility]. Per [facility] New Patient  Packet.  Marland Kitchen History of Papanicolaou smear of cervix 10/16/2011   Per [facility] Patient Packet  . Hypotension    Per [facility] New Patient Packet.  . IBS (irritable bowel syndrome)    Per [facility] New Patient Packet.  . Lactose intolerance   . Osteoarthritis    Per [facility] New Patient Packet.  Marland Kitchen Osteopenia    Per [facility] New Patient Packet.  . Osteoporosis   . Premature ventricular complex   . Raynaud's disease    Per [facility] New Patient Packet.  . Scoliosis    Per [facility] New Patient Packet.   Past Surgical History:  Procedure Laterality Date  . ABDOMINAL EXPOSURE N/A 08/12/2020   Procedure: ABDOMINAL EXPOSURE;  Surgeon: Serafina Mitchell, MD;  Location: [facility];  Service: Vascular;  Laterality: N/A;  . ABDOMINAL HYSTERECTOMY  06/13/2011   By Dr.Scherer at [facility]. Per [facility] New Patient Packet.  . ANTERIOR LAT LUMBAR FUSION Right 08/12/2020   Procedure: Right Lumbar two-three, Lumbar three-four, Lumbar four-five Anterolateral lumbar interbody fusion;  Surgeon: Erline Levine, MD;  Location: [facility];  Service: Neurosurgery;  Laterality: Right;  . ANTERIOR LUMBAR FUSION N/A 08/12/2020   Procedure: Lumbar five Sacral one Anterior lumbar interbody fusion;  Surgeon: Erline Levine, MD;  Location: [facility];  Service: Neurosurgery;  Laterality: N/A;  . BREAST EXCISIONAL BIOPSY Left 30+ yrs ago  . COLONOSCOPY  11/23/2011   By Dr.McCune at [facility]. Per [facility] Patient Packet.  . COLONOSCOPY  08/26/2019  . LUMBAR PERCUTANEOUS PEDICLE SCREW 4 LEVEL N/A 08/12/2020   Procedure: Percutaneous pedicle screw fixation from Lumbar two to Sacral one ;  Surgeon: Erline Levine, MD;  Location: [place];  Service: Neurosurgery;  Laterality: N/A;  . MAMMOGRAM  04/14/2019  . pap smear  10/16/2011  . SIGMOIDOSCOPY  11/02/2014   Per [facility] New Patient Packet  . TONSILLECTOMY  06/[birth year]/1948   Per [facility] New Patient Packet.    reports that she has quit smoking. She started smoking about 43 years ago. She has never used smokeless  tobacco. She reports previous alcohol use of about 7.0 standard drinks of alcohol per week. She reports that she does not use drugs. Social History   Socioeconomic History  . Marital status: Married    Spouse name: Not on file  . Number of children: 2  . Years of education: Not on file  . Highest education level: Associate degree: occupational, Hotel manager, or vocational program  Occupational History  . Occupation: Retired   Tobacco Use  . Smoking status: Former Smoker    Start date: 06/12/1977  . Smokeless tobacco: Never Used  . Tobacco comment: 42 years ago. Smoked from age [age]  Vaping Use  . Vaping Use: Never used  Substance and Sexual Activity  . Alcohol use: Not Currently    Alcohol/week: 7.0 standard drinks    Types: 7 Glasses of wine per week    Comment: was a glass of wine daily  .  Drug use: No  . Sexual activity: Not on file  Other Topics Concern  . Not on file  Social History Narrative   Tobacco use, amount per day now: No   Past tobacco use, amount per day: Smoked ages [age]   How many years did you use tobacco: 17-20 years   Alcohol use (drinks per week): 1 glass of Red Wine per day.   Diet: Good   Do you drink/eat things with caffeine: No   Marital status: Married                                  What year were you married? 1978   Do you live in a house, apartment, assisted living, condo, trailer, etc.? Apartment/Independent Living   Is it one or more stories? 1   How many persons live in your home? 2    Do you have pets in your home?( please list) No   Highest Level of Education completed? 2 years of college.    Current or past profession: Retired Futures trader   Do you exercise?  Yes                                Type and how often? 31min workout 5 days week. Walking 3-4 Days a Week.    Do you have a living will? Yes   Do you have a DNR form?  Yes                                 If not, do you want to discuss one?   Do you have signed POA/HPOA forms? Yes                        If so, please bring to you appointment      Do you have difficulty bathing or dressing yourself? No   Do you have difficulty preparing food or eating? No   Do you have difficulty managing your medications? No   Do you have difficulty managing your finances? No   Do you have difficulty affording your medications? No       Per [facility] New Patient Packet. Abstracted by Jasmine/RMA.    Social Determinants of Health   Financial Resource Strain: Not on file  Food Insecurity: Not on file  Transportation Needs: Not on file  Physical Activity: Not on file  Stress: Not on file  Social Connections: Not on file  Intimate Partner Violence: Not on file    Functional Status Survey:    Family History  Problem Relation Age of Onset  . Cancer Mother   . Cancer Father     Health Maintenance  Topic Date Due  . Hepatitis C Screening  Never done  . PNA vac Low Risk Adult (2 of 2 - PPSV23) 12/12/2015  . TETANUS/TDAP  04/08/2018  . INFLUENZA VACCINE  01/11/2020  . COVID-19 Vaccine (3 - Booster for Moderna series) 01/11/2020  . DEXA SCAN  Completed  . HPV VACCINES  Aged Out    Allergies  Allergen Reactions  . Banana Swelling    Swelling around mouth and eyes and red blotches  . Latex Swelling, Rash and Other (See Comments)  . Ciprofloxacin Hcl Other (See Comments)    Severe knee  inflammation   . Gatifloxacin Other (See Comments)    Severe knee inflammation   . Levofloxacin Other (See Comments)    Severe knee inflammation   . Moxifloxacin Other (See Comments)    Severe knee inflammation   . Norfloxacin Other (See Comments)    Severe knee inflammation   . Nsaids Other (See Comments)    Upsets ulcers  . Ofloxacin Other (See Comments)    Severe knee inflammation  . Other Other (See Comments)    FLOXIN  . Penicillins Hives    Tolerates (KEFLEX-cephalexin)   . Methylisothiazolinone Rash and Other (See Comments)    A preservative found in a cream pt used  .  Tape Rash    Allergies as of 08/25/2020      Reactions   Banana Swelling   Swelling around mouth and eyes and red blotches   Latex Swelling, Rash, Other (See Comments)   Ciprofloxacin Hcl Other (See Comments)   Severe knee inflammation   Gatifloxacin Other (See Comments)   Severe knee inflammation   Levofloxacin Other (See Comments)   Severe knee inflammation   Moxifloxacin Other (See Comments)   Severe knee inflammation   Norfloxacin Other (See Comments)   Severe knee inflammation   Nsaids Other (See Comments)   Upsets ulcers   Ofloxacin Other (See Comments)   Severe knee inflammation   Other Other (See Comments)   FLOXIN   Penicillins Hives   Tolerates (KEFLEX-cephalexin)    Methylisothiazolinone Rash, Other (See Comments)   A preservative found in a cream pt used   Tape Rash      Medication List       Accurate as of August 25, 2020 10:28 AM. If you have any questions, ask your nurse or doctor.        aspirin EC 81 MG tablet Take 81 mg by mouth daily with breakfast.   B-complex with vitamin C tablet Take 1 tablet by mouth daily.   Biotin 5000 MCG Tabs Take 5,000 mcg by mouth daily.   CALCIUM PO Take 1,200 mg by mouth daily.   CYSTEX PO Take 100 mg by mouth daily. ( Cranberry Concentrate )   escitalopram 5 MG tablet Commonly known as: LEXAPRO Take 5 mg by mouth daily.   Glucosamine HCl 1500 MG Tabs Take 1,500 mg by mouth daily.   Loperamide-Simethicone 2-125 MG Tabs Take 1 tablet by mouth 4 (four) times daily as needed (diarrhea/loose stools).   LORazepam 0.5 MG tablet Commonly known as: ATIVAN Take 0.25 mg by mouth once.   methenamine 1 g tablet Commonly known as: HIPREX Take 1 g by mouth 2 (two) times daily with a meal. For UTI Prevention. Last UTI: October 20th, November 5th, and December 28th.   methocarbamol 500 MG tablet Commonly known as: ROBAXIN Take 1 tablet (500 mg total) by mouth every 6 (six) hours as needed for muscle spasms.    multivitamin with minerals Tabs tablet Take 1 tablet by mouth daily. One A Day for Women   NON FORMULARY CBD Full spectrum 1 ml as needed for sleep.   Premarin vaginal cream Generic drug: conjugated estrogens Place 1 Applicatorful vaginally 3 (three) times a week. What changed:   how much to take  additional instructions   PROBIOTIC COLON SUPPORT PO Take 1 tablet by mouth in the morning and at bedtime.   Systane 0.4-0.3 % Soln Generic drug: Polyethyl Glycol-Propyl Glycol Place 1 drop into both eyes as needed (dry/irritated eyes).   traMADol 50  MG tablet Commonly known as: Ultram Take 1 tablet (50 mg total) by mouth every 6 (six) hours as needed for severe pain.   valACYclovir 500 MG tablet Commonly known as: VALTREX TAKE (1) TABLET DAILY AS NEEDED.   vitamin C 500 MG tablet Commonly known as: ASCORBIC ACID Take 500 mg by mouth daily.   Vitamin D3 50 MCG (2000 UT) Tabs Take 2,000 Units by mouth in the morning, at noon, and at bedtime.   ZEGERID PO Take 20 mg by mouth 2 (two) times daily.   zinc oxide 20 % ointment Apply 1 application topically as needed for irritation.       Review of Systems  Constitutional: Positive for activity change and appetite change.  HENT: Negative.   Respiratory: Negative.   Cardiovascular: Negative.   Gastrointestinal: Positive for diarrhea.  Genitourinary: Positive for dysuria, frequency and urgency.  Musculoskeletal: Positive for arthralgias, back pain, gait problem and myalgias.  Neurological: Positive for weakness.  Psychiatric/Behavioral: Positive for dysphoric mood and sleep disturbance. The patient is nervous/anxious.     Vitals:   08/25/20 1019  BP: (!) 146/73  Pulse: 80  Resp: 16  Temp: (!) 96.9 F (36.1 C)  SpO2: 95%  Weight: 130 lb 3.2 oz (59.1 kg)  Height: 5\' 2"  (1.575 m)   Body mass index is 23.81 kg/m. Physical Exam Vitals reviewed.  Constitutional:      Appearance: Normal appearance.  HENT:      Head: Normocephalic.     Nose: Nose normal.     Mouth/Throat:     Mouth: Mucous membranes are moist.     Pharynx: Oropharynx is clear.  Eyes:     Pupils: Pupils are equal, round, and reactive to light.  Cardiovascular:     Rate and Rhythm: Normal rate and regular rhythm.     Pulses: Normal pulses.  Pulmonary:     Effort: Pulmonary effort is normal.     Breath sounds: Normal breath sounds.  Abdominal:     General: Abdomen is flat. Bowel sounds are normal.     Palpations: Abdomen is soft.  Musculoskeletal:        General: No swelling.     Cervical back: Neck supple.  Skin:    General: Skin is warm.     Comments: Her surgical incision has healed well  Neurological:     General: No focal deficit present.     Mental Status: She is alert and oriented to person, place, and time.  Psychiatric:        Mood and Affect: Mood normal.        Thought Content: Thought content normal.     Labs reviewed: Basic Metabolic Panel: Recent Labs    08/21/20 0416 08/21/20 1259 08/22/20 0415 08/23/20 0158  NA 131* 134* 135 136  K 4.3  --  4.2 4.2  CL 94*  --  98 101  CO2 29  --  30 27  GLUCOSE 180*  --  89 110*  BUN 20  --  15 12  CREATININE 0.62  --  0.68 0.64  CALCIUM 8.7*  --  8.3* 8.7*  MG 1.7  --  1.7 1.9   Liver Function Tests: Recent Labs    08/29/19 0826 11/19/19 0929  AST 22 20  ALT 17 15  BILITOT 0.5 0.4  PROT 6.8 5.9*   No results for input(s): LIPASE, AMYLASE in the last 8760 hours. No results for input(s): AMMONIA in the last 8760 hours. CBC: Recent  Labs    08/29/19 0826 09/02/19 1016 11/19/19 0929 08/09/20 1530 08/21/20 0416 08/22/20 0415 08/23/20 0158  WBC 6.3 7.0 6.2   < > 18.5* 20.0* 19.5*  NEUTROABS 3,484 4.4 3,422  --   --   --   --   HGB 13.8 13.5 12.9   < > 8.3* 8.2* 8.6*  HCT 41.8 42.1 38.7   < > 24.9* 25.1* 26.2*  MCV 95.2 99.8 99.2   < > 99.2 100.4* 100.8*  PLT 374 307 270   < > 462* 491* 489*   < > = values in this interval not displayed.    Cardiac Enzymes: No results for input(s): CKTOTAL, CKMB, CKMBINDEX, TROPONINI in the last 8760 hours. BNP: Invalid input(s): POCBNP No results found for: HGBA1C Lab Results  Component Value Date   TSH 1.837 08/17/2020   Lab Results  Component Value Date   ZCHYIFOY77 412 11/19/2019   No results found for: FOLATE Lab Results  Component Value Date   IRON 37 08/17/2020   TIBC 221 (L) 08/17/2020   FERRITIN 219 08/17/2020    Imaging and Procedures obtained prior to SNF admission: No results found.  Assessment/Plan Back pain with history of spinal surgery Had very bad night yesterday due to pain Will restart Norco 5/325 One tab Q4 PRN for Pain Will Schedule Norco 1 tab at Night Continue Tramadol PRN also Also Ativan 0.25 mg Q 46 PRN for 2 weeks  Hyponatremia Repeat BMP  Dysuria Will get UA and Culture  Metabolic Encephalopathy Mostly resolved. Most Likely due to Hyponatremia Mental status at baseline  Leucocytosis Was on Steroids in the hospital  Will repeat Labs and Follow Blood loss anemia Repeat CBC in Am Will also start on Iron Depression, recurrent (HCC) Was started on Lexapro yesterday Have to follow sodium  Lyphocytic colitis Has not tolerated Budesonide Managed with Diet modifications    Family/ staff Communication:   Labsm/tests ordered:

## 2020-08-26 ENCOUNTER — Non-Acute Institutional Stay (SKILLED_NURSING_FACILITY): Payer: Medicare Other | Admitting: Internal Medicine

## 2020-08-26 ENCOUNTER — Encounter: Payer: Self-pay | Admitting: Internal Medicine

## 2020-08-26 DIAGNOSIS — R300 Dysuria: Secondary | ICD-10-CM

## 2020-08-26 DIAGNOSIS — E871 Hypo-osmolality and hyponatremia: Secondary | ICD-10-CM

## 2020-08-26 DIAGNOSIS — F339 Major depressive disorder, recurrent, unspecified: Secondary | ICD-10-CM

## 2020-08-26 DIAGNOSIS — D500 Iron deficiency anemia secondary to blood loss (chronic): Secondary | ICD-10-CM

## 2020-08-26 DIAGNOSIS — N952 Postmenopausal atrophic vaginitis: Secondary | ICD-10-CM

## 2020-08-26 DIAGNOSIS — Z9889 Other specified postprocedural states: Secondary | ICD-10-CM

## 2020-08-26 DIAGNOSIS — K52832 Lymphocytic colitis: Secondary | ICD-10-CM

## 2020-08-26 DIAGNOSIS — M549 Dorsalgia, unspecified: Secondary | ICD-10-CM

## 2020-08-26 NOTE — Progress Notes (Signed)
Location:    [facility] Room Number: 27 Place of Service:  SNF [phone]) Provider:  Veleta Miners MD  Virgie Dad, MD  Patient Care Team: Virgie Dad, MD as PCP - General (Internal Medicine) Rozetta Nunnery, MD as Consulting Physician (Otolaryngology) Luberta Mutter, MD as Consulting Physician (Ophthalmology)  Extended Emergency Contact Information Primary Emergency Contact: Colon,William M Address: [address] [place]          [place], [place] [address] Johnnette Litter of [place] Phone: [phone] Mobile Phone: [phone] Relation: Spouse Secondary Emergency Contact: Reeves,Pam    POA Address: [place], [place] [address] Johnnette Litter of [place] Phone: [phone] Relation: Niece Preferred language: English Interpreter needed? No  Code Status:  Full Code Managed Care Goals of care: Advanced Directive information Advanced Directives 08/25/2020  Does Patient Have a Medical Advance Directive? Yes  Type of Paramedic of [place];Living will  Does patient want to make changes to medical advance directive? -  Copy of [place] in Chart? No - copy requested  Would patient like information on creating a medical advance directive? -     Chief Complaint  Patient presents with  . Acute Visit    Follow up of Pain     HPI:  Pt is a 80 y.o. female seen today for an acute visit for Follow up for her pain  Patient was admitted in the hospital from 3/3-3/14 underwent decompression and fusion of her lumbar spine.  2-3, 3-4, 4-5  Patient has a history of  recurrent UTI prophylaxis with Hip-Rex History of depression, h/o osteoporosis History of lymphocytic colitis was not able to tolerate budesonide History of anxiety attacks versus TIA Previous work-up with neurology including MRI. EEG has been negative  Was electively admitted for lumbar spine fusion  for lumbar scoliosis and stenosis Postop was complicated with acute metabolic encephalopathy and hyponatremia Her hyponatremia was thought to be due to poor p.o. intake. Her trazodone and gabapentin were stopped to see  reason for SIADH.  She was treated with hypertonic saline Her mentation improved. Anemia was thought to be due to dilution and did improve  Pain has been her main complaint since she has been  in SNF.  I had started her on Norco every 4 hours with Ativan.  It did help her anxiety last night. She still had a lot of questions and concerns She also wanted to know when she can go back to her apartment as she is not happy here.  She is able to walk with her walker now and do all her transfers.   Past Medical History:  Diagnosis Date  . Barrett's esophagus   . Colitis   . DDD (degenerative disc disease)   . Erroneous encounter - disregard    error   . Erroneous encounter - disregard   . Erroneous encounter - disregard   . Factor 5 Leiden mutation, heterozygous [facility])    Per [facility] Patient Packet.  . Frequent UTI   . H/O echocardiogram 04/01/2019   Per [facility] Patient Packet.  . Hiatal hernia    Per [facility] Patient Packet.  . High grade dysplasia of Barrett's epithelium    Per [facility] New Patient Packet.  Marland Kitchen History of bladder infections    Per [facility] New Patient Packet.  Marland Kitchen History of bone density study 07/12/2018   Per [facility]  New Patient Packet.  Marland Kitchen History of bone density study 07/12/2018   Up coming appointment 07/17/2019, Per [facility] New Patient Packet  . History of bone density study 07/17/2019  . History of endoscopy 12/17/2015   Needed Every 3 years. Scheduled for January 2021. Per [facility] New Patient Packet.   Marland Kitchen History of mammogram 04/14/2019   Per [facility] Patient Packet.  Marland Kitchen History of MRI 03/01/2019   By Dr.Gulati/ Neurologist. MRI of Brain. Per [facility] New Patient Packet.  Marland Kitchen History of MRI 04/23/2019   By Dr. Maryan Rued at [facility]. Per [facility] New Patient Packet.  Marland Kitchen  History of Papanicolaou smear of cervix 10/16/2011   Per [facility] Patient Packet  . Hypotension    Per [facility] New Patient Packet.  . IBS (irritable bowel syndrome)    Per [facility] New Patient Packet.  . Lactose intolerance   . Osteoarthritis    Per [facility] New Patient Packet.  Marland Kitchen Osteopenia    Per [facility] New Patient Packet.  . Osteoporosis   . Premature ventricular complex   . Raynaud's disease    Per [facility] New Patient Packet.  . Scoliosis    Per [facility] New Patient Packet.   Past Surgical History:  Procedure Laterality Date  . ABDOMINAL EXPOSURE N/A 08/12/2020   Procedure: ABDOMINAL EXPOSURE;  Surgeon: Serafina Mitchell, MD;  Location: [facility];  Service: Vascular;  Laterality: N/A;  . ABDOMINAL HYSTERECTOMY  06/13/2011   [facility] Dr[facility]Scherer at [facility]. Per [facility] New Patient Packet.  . ANTERIOR LAT LUMBAR FUSION Right 08/12/2020   Procedure: Right Lumbar two-three, Lumbar three-four, Lumbar four-five Anterolateral lumbar interbody fusion;  Surgeon: Erline Levine, MD;  Location: [facility];  Service: Neurosurgery;  Laterality: Right;  . ANTERIOR LUMBAR FUSION N/A 08/12/2020   Procedure: Lumbar five Sacral one Anterior lumbar interbody fusion;  Surgeon: Erline Levine, MD;  Location: [facility];  Service: Neurosurgery;  Laterality: N/A;  . BREAST EXCISIONAL BIOPSY Left 30+ yrs ago  . COLONOSCOPY  11/23/2011   By Dr.McCune at [facility]. Per [facility] Patient Packet.  . COLONOSCOPY  08/26/2019  . LUMBAR PERCUTANEOUS PEDICLE SCREW 4 LEVEL N/A 08/12/2020   Procedure: Percutaneous pedicle screw fixation from Lumbar two to Sacral one ;  Surgeon: Erline Levine, MD;  Location: [facility];  Service: Neurosurgery;  Laterality: N/A;  . MAMMOGRAM  04/14/2019  . pap smear  10/16/2011  . SIGMOIDOSCOPY  11/02/2014   Per [facility] New Patient Packet  . TONSILLECTOMY  06/12/1948   Per [facility] New Patient Packet.    Allergies  Allergen Reactions  . Banana Swelling    Swelling around mouth and eyes and red blotches  . Latex Swelling,  Rash and Other (See Comments)  . Ciprofloxacin Hcl Other (See Comments)    Severe knee inflammation   . Gatifloxacin Other (See Comments)    Severe knee inflammation   . Levofloxacin Other (See Comments)    Severe knee inflammation   . Moxifloxacin Other (See Comments)    Severe knee inflammation   . Norfloxacin Other (See Comments)    Severe knee inflammation   . Nsaids Other (See Comments)    Upsets ulcers  . Ofloxacin Other (See Comments)    Severe knee inflammation  . Other Other (See Comments)    FLOXIN  . Penicillins Hives    Tolerates (KEFLEX-cephalexin)   . Methylisothiazolinone Rash and Other (See Comments)    A preservative found in a cream pt used  . Tape Rash  Allergies as of 08/26/2020      Reactions   Banana Swelling   Swelling around mouth and eyes and red blotches   Latex Swelling, Rash, Other (See Comments)   Ciprofloxacin Hcl Other (See Comments)   Severe knee inflammation   Gatifloxacin Other (See Comments)   Severe knee inflammation   Levofloxacin Other (See Comments)   Severe knee inflammation   Moxifloxacin Other (See Comments)   Severe knee inflammation   Norfloxacin Other (See Comments)   Severe knee inflammation   Nsaids Other (See Comments)   Upsets ulcers   Ofloxacin Other (See Comments)   Severe knee inflammation   Other Other (See Comments)   FLOXIN   Penicillins Hives   Tolerates (KEFLEX-cephalexin)    Methylisothiazolinone Rash, Other (See Comments)   A preservative found in a cream pt used   Tape Rash      Medication List       Accurate as of August 26, 2020  9:41 AM. If you have any questions, ask your nurse or doctor.        aspirin EC 81 MG tablet Take 81 mg by mouth daily with breakfast.   B-complex with vitamin C tablet Take 1 tablet by mouth daily.   Biotin 5000 MCG Tabs Take 5,000 mcg by mouth daily.   CALCIUM PO Take 1,200 mg by mouth daily.   [employer] Milk of Magnesia 400 MG/5ML suspension Generic drug:  magnesium hydroxide Take by mouth daily as needed for mild constipation.   CYSTEX PO Take 100 mg by mouth daily. ( Cranberry Concentrate )   escitalopram 5 MG tablet Commonly known as: LEXAPRO Take 5 mg by mouth daily.   Glucosamine HCl 1500 MG Tabs Take 1,500 mg by mouth daily.   HYDROcodone-acetaminophen 5-325 MG tablet Commonly known as: NORCO/VICODIN Take 1 tablet by mouth as needed for moderate pain.   HYDROcodone-acetaminophen 5-325 MG tablet Commonly known as: Norco Take 1 tablet by mouth every 4 (four) hours as needed for up to 14 days for moderate pain.   Loperamide-Simethicone 2-125 MG Tabs Take 1 tablet by mouth 4 (four) times daily as needed (diarrhea/loose stools).   LORazepam 0.5 MG tablet Commonly known as: ATIVAN Take 0.5 tablets (0.25 mg total) by mouth every 6 (six) hours as needed for up to 14 days for anxiety. What changed: when to take this   methenamine 1 g tablet Commonly known as: HIPREX Take 1 g by mouth 2 (two) times daily with a meal. For UTI Prevention. Last UTI: October 20th, November 5th, and December 28th.   methocarbamol 500 MG tablet Commonly known as: ROBAXIN Take 1 tablet (500 mg total) by mouth every 6 (six) hours as needed for muscle spasms.   multivitamin with minerals Tabs tablet Take 1 tablet by mouth daily. One A Day for Women   NON FORMULARY CBD Full spectrum 1 ml as needed for sleep.   Premarin vaginal cream Generic drug: conjugated estrogens Place 1 Applicatorful vaginally 3 (three) times a week.   PROBIOTIC COLON SUPPORT PO Take 1 tablet by mouth in the morning and at bedtime.   Systane 0.4-0.3 % Soln Generic drug: Polyethyl Glycol-Propyl Glycol Place 1 drop into both eyes as needed (dry/irritated eyes).   traMADol 50 MG tablet Commonly known as: Ultram Take 1 tablet (50 mg total) by mouth every 6 (six) hours as needed for severe pain.   valACYclovir 500 MG tablet Commonly known as: VALTREX TAKE (1) TABLET DAILY  AS NEEDED.  vitamin C 500 MG tablet Commonly known as: ASCORBIC ACID Take 500 mg by mouth daily.   Vitamin D3 50 MCG (2000 UT) Tabs Take 2,000 Units by mouth in the morning, at noon, and at bedtime.   ZEGERID PO Take 20 mg by mouth 2 (two) times daily.   zinc oxide 20 % ointment Apply 1 application topically as needed for irritation.       Review of Systems  Constitutional: Positive for activity change.  HENT: Negative.   Respiratory: Negative.   Cardiovascular: Negative.   Gastrointestinal: Negative.   Genitourinary: Positive for dysuria and frequency.  Musculoskeletal: Positive for arthralgias, back pain, gait problem and myalgias.  Skin: Negative.   Neurological: Positive for weakness.  Psychiatric/Behavioral: Positive for dysphoric mood.    Immunization History  Administered Date(s) Administered  . Influenza, High Dose Seasonal PF 03/26/2019  . Moderna Sars-Covid-2 Vaccination 06/16/2019, 07/14/2019  . Pneumococcal Conjugate-13 12/12/2014  . Tdap 04/08/2008  . Zoster Recombinat (Shingrix) 06/12/2017   Pertinent  Health Maintenance Due  Topic Date Due  . PNA vac Low Risk Adult (2 of 2 - PPSV23) 12/12/2015  . INFLUENZA VACCINE  01/11/2020  . DEXA SCAN  Completed   Fall Risk  05/12/2020 02/11/2020 12/17/2019 11/12/2019 08/15/2019  Falls in the past year? 0 0 0 0 0  Number falls in past yr: 0 0 0 0 0  Injury with Fall? - - - - 0   Functional Status Survey:    Vitals:   08/26/20 0934  BP: 116/60  Pulse: 90  Resp: 14  Temp: (!) 97.1 F (36.2 C)  SpO2: 99%  Weight: 130 lb 3.2 oz (59.1 kg)  Height: 5\' 2"  (1.575 m)   Body mass index is 23.81 kg/m. Physical Exam Constitutional: Oriented to person, place, and time. Well-developed and well-nourished.  HENT:  Head: Normocephalic.  Mouth/Throat: Oropharynx is clear and moist.  Eyes: Pupils are equal, round, and reactive to light.  Neck: Neck supple.  Cardiovascular: Normal rate and normal heart sounds.  No  murmur heard. Pulmonary/Chest: Effort normal and breath sounds normal. No respiratory distress. No wheezes. She has no rales.  Abdominal: Soft. Bowel sounds are normal. No distension. There is no tenderness. There is no rebound.  Musculoskeletal: No edema.  Lymphadenopathy: none Neurological: Alert and oriented to person, place, and time.  Skin: Skin is warm and dry.  Psychiatric: Normal mood and affect. Behavior is normal. Thought content normal.   Labs reviewed: Recent Labs    08/21/20 0416 08/21/20 1259 08/22/20 0415 08/23/20 0158  NA 131* 134* 135 136  K 4.3  --  4.2 4.2  CL 94*  --  98 101  CO2 29  --  30 27  GLUCOSE 180*  --  89 110*  BUN 20  --  15 12  CREATININE 0.62  --  0.68 0.64  CALCIUM 8.7*  --  8.3* 8.7*  MG 1.7  --  1.7 1.9   Recent Labs    08/29/19 0826 11/19/19 0929  AST 22 20  ALT 17 15  BILITOT 0.5 0.4  PROT 6.8 5.9*   Recent Labs    08/29/19 0826 09/02/19 1016 11/19/19 0929 08/09/20 1530 08/21/20 0416 08/22/20 0415 08/23/20 0158  WBC 6.3 7.0 6.2   < > 18.5* 20.0* 19.5*  NEUTROABS 3,484 4.4 3,422  --   --   --   --   HGB 13.8 13.5 12.9   < > 8.3* 8.2* 8.6*  HCT 41.8 42.1 38.7   < >  24.9* 25.1* 26.2*  MCV 95.2 99.8 99.2   < > 99.2 100.4* 100.8*  PLT 374 307 270   < > 462* 491* 489*   < > = values in this interval not displayed.   Lab Results  Component Value Date   TSH 1.837 08/17/2020   No results found for: HGBA1C Lab Results  Component Value Date   CHOL 228 (H) 08/29/2019   HDL 93 08/29/2019   LDLCALC 112 (H) 08/29/2019   TRIG 115 08/29/2019   CHOLHDL 2.5 08/29/2019    Significant Diagnostic Results in last 30 days:  DG Lumbar Spine 2-3 Views  Result Date: 08/12/2020 CLINICAL DATA:  Lumbar surgery. EXAM: LUMBAR SPINE - 2-3 VIEW; DG C-ARM 1-60 MIN COMPARISON:  Prior study same day.  MRI 03/04/2020. FINDINGS: Lumbar spine numbered as per prior MRI. Postsurgical changes noted of the lumbosacral spine with L2 through S1 posterior  and interbody fusion. L5-S1 anterior fusion. 3 minutes 47 seconds fluoroscopy time. 69.46 mGy. IMPRESSION: Postsurgical changes lumbosacral spine. Electronically Signed   By: Marcello Moores  Register   On: 08/12/2020 16:31   DG Lumbar Spine 2-3 Views  Result Date: 08/12/2020 CLINICAL DATA:  Lumbar surgery. EXAM: LUMBAR SPINE - 2-3 VIEW COMPARISON:  MRI 03/04/2020. FINDINGS: Postsurgical changes lumbar spine with interbody fusion noted. Lower lumbar fusion present. Visualized hardware intact. Anatomic alignment. Scoliosis and diffuse degenerative change. No acute bony abnormality. IMPRESSION: Postsurgical changes lumbar spine. Electronically Signed   By: Marcello Moores  Register   On: 08/12/2020 13:01   DG CHEST PORT 1 VIEW  Result Date: 08/16/2020 CLINICAL DATA:  Short of breath, productive cough EXAM: PORTABLE CHEST 1 VIEW COMPARISON:  10/13/2004 FINDINGS: Hypoventilation with decreased lung volume. Left lower lobe consolidation, moderate. Mild right lower lobe airspace disease. No edema or effusion. IMPRESSION: Bibasilar airspace disease left greater than right. Possible atelectasis versus pneumonia. Electronically Signed   By: Franchot Gallo M.D.   On: 08/16/2020 11:52   DG C-Arm 1-60 Min  Result Date: 08/12/2020 CLINICAL DATA:  Lumbar surgery. EXAM: LUMBAR SPINE - 2-3 VIEW; DG C-ARM 1-60 MIN COMPARISON:  Prior study same day.  MRI 03/04/2020. FINDINGS: Lumbar spine numbered as per prior MRI. Postsurgical changes noted of the lumbosacral spine with L2 through S1 posterior and interbody fusion. L5-S1 anterior fusion. 3 minutes 47 seconds fluoroscopy time. 69.46 mGy. IMPRESSION: Postsurgical changes lumbosacral spine. Electronically Signed   By: Marcello Moores  Register   On: 08/12/2020 16:31   DG OR LOCAL ABDOMEN  Addendum Date: 08/12/2020   ADDENDUM REPORT: 08/12/2020 13:23 ADDENDUM: Surgical clip noted over the right abdomen. No retained surgical instruments noted. Electronically Signed   By: Marcello Moores  Register   On:  08/12/2020 13:23   Result Date: 08/12/2020 CLINICAL DATA:  Evaluation for retained instrument. EXAM: OR LOCAL ABDOMEN COMPARISON:  No recent prior. FINDINGS: Right lateral and upper abdomen not completely imaged. Soft tissue structures are unremarkable. No evidence of retained surgical instrument. Prior lumbosacral spine fusion. Pelvic calcification consistent phleboliths. Degenerative changes and scoliosis lumbar spine. Degenerative changes both hips. IMPRESSION: No retained surgical instrument noted. Report phoned to the operating room at the time of the study. Electronically Signed: By: Marcello Moores  Register On: 08/12/2020 10:48    Assessment/Plan Back pain with history of spinal surgery Schedule Norco 2 tabs at 9 pm Continue Tramadol and Robaxin Also Ativan for anxiety D/W nurse in charge  Hyponatremia Sodium 134 today Dysuria UA positive for Nitrites and WBC. Cultures pending Will start Cefpodoxime 200 mg BID for 7  days Blood loss anemia Hgb 8.8 Start on Iron  Leucocytosis White count was normal today  Depression, recurrent (HCC) On Lexapro  Lymphocytic colitis Has not tolerated Budesonide Managed with Diet modifications    Family/ staff Communication:   Labs/tests ordered:    Total time spent in this patient care encounter was  45_  minutes; greater than 50% of the visit spent counseling patient and staff, reviewing records , Labs and coordinating care for problems addressed at this encounter.

## 2020-08-28 ENCOUNTER — Encounter: Payer: Self-pay | Admitting: Internal Medicine

## 2020-08-30 ENCOUNTER — Other Ambulatory Visit: Payer: Self-pay | Admitting: Nurse Practitioner

## 2020-08-30 MED ORDER — TRAMADOL HCL 50 MG PO TABS: 50.0000 mg | ORAL_TABLET | Freq: Three times a day (TID) | ORAL | 0 refills | Status: DC | PRN

## 2020-08-31 ENCOUNTER — Other Ambulatory Visit: Payer: Self-pay

## 2020-08-31 ENCOUNTER — Emergency Department (HOSPITAL_COMMUNITY)
Admission: EM | Admit: 2020-08-31 | Discharge: 2020-08-31 | Disposition: A | Discharge status: Home / Self Care | Payer: Medicare Other | Attending: Emergency Medicine | Admitting: Emergency Medicine

## 2020-08-31 ENCOUNTER — Encounter (HOSPITAL_COMMUNITY): Payer: Self-pay | Admitting: Emergency Medicine

## 2020-08-31 ENCOUNTER — Emergency Department (HOSPITAL_COMMUNITY): Payer: Medicare Other

## 2020-08-31 DIAGNOSIS — Z87891 Personal history of nicotine dependence: Secondary | ICD-10-CM | POA: Insufficient documentation

## 2020-08-31 DIAGNOSIS — G8918 Other acute postprocedural pain: Secondary | ICD-10-CM | POA: Insufficient documentation

## 2020-08-31 DIAGNOSIS — Z91040 Latex allergy status: Secondary | ICD-10-CM | POA: Diagnosis not present

## 2020-08-31 DIAGNOSIS — M545 Low back pain, unspecified: Secondary | ICD-10-CM | POA: Insufficient documentation

## 2020-08-31 DIAGNOSIS — Z981 Arthrodesis status: Secondary | ICD-10-CM | POA: Insufficient documentation

## 2020-08-31 DIAGNOSIS — Z79899 Other long term (current) drug therapy: Secondary | ICD-10-CM | POA: Diagnosis not present

## 2020-08-31 LAB — CBC WITH DIFFERENTIAL/PLATELET
Abs Immature Granulocytes: 0.06 10*3/uL (ref 0.00–0.07)
Basophils Absolute: 0.1 10*3/uL (ref 0.0–0.1)
Basophils Relative: 1 %
Eosinophils Absolute: 0 10*3/uL (ref 0.0–0.5)
Eosinophils Relative: 1 %
HCT: 28.1 % — ABNORMAL LOW (ref 36.0–46.0)
Hemoglobin: 9.2 g/dL — ABNORMAL LOW (ref 12.0–15.0)
Immature Granulocytes: 1 %
Lymphocytes Relative: 20 %
Lymphs Abs: 1.7 10*3/uL (ref 0.7–4.0)
MCH: 33.6 pg (ref 26.0–34.0)
MCHC: 32.7 g/dL (ref 30.0–36.0)
MCV: 102.6 fL — ABNORMAL HIGH (ref 80.0–100.0)
Monocytes Absolute: 0.9 10*3/uL (ref 0.1–1.0)
Monocytes Relative: 11 %
Neutro Abs: 5.7 10*3/uL (ref 1.7–7.7)
Neutrophils Relative %: 66 %
Platelets: 498 10*3/uL — ABNORMAL HIGH (ref 150–400)
RBC: 2.74 MIL/uL — ABNORMAL LOW (ref 3.87–5.11)
RDW: 14.4 % (ref 11.5–15.5)
WBC: 8.4 10*3/uL (ref 4.0–10.5)
nRBC: 0 % (ref 0.0–0.2)

## 2020-08-31 LAB — COMPREHENSIVE METABOLIC PANEL
ALT: 19 U/L (ref 0–44)
AST: 24 U/L (ref 15–41)
Albumin: 3.4 g/dL — ABNORMAL LOW (ref 3.5–5.0)
Alkaline Phosphatase: 96 U/L (ref 38–126)
Anion gap: 7 (ref 5–15)
BUN: 11 mg/dL (ref 8–23)
CO2: 28 mmol/L (ref 22–32)
Calcium: 8.8 mg/dL — ABNORMAL LOW (ref 8.9–10.3)
Chloride: 103 mmol/L (ref 98–111)
Creatinine, Ser: 0.59 mg/dL (ref 0.44–1.00)
GFR, Estimated: >60 mL/min (ref >60)
Glucose, Bld: 109 mg/dL — ABNORMAL HIGH (ref 70–99)
Potassium: 3.8 mmol/L (ref 3.5–5.1)
Sodium: 138 mmol/L (ref 135–145)
Total Bilirubin: 0.6 mg/dL (ref 0.3–1.2)
Total Protein: 5.9 g/dL — ABNORMAL LOW (ref 6.5–8.1)

## 2020-08-31 LAB — URINALYSIS, ROUTINE W REFLEX MICROSCOPIC
Bilirubin Urine: NEGATIVE
Glucose, UA: NEGATIVE mg/dL
Hgb urine dipstick: NEGATIVE
Ketones, ur: NEGATIVE mg/dL
Leukocytes,Ua: NEGATIVE
Nitrite: NEGATIVE
Protein, ur: NEGATIVE mg/dL
Specific Gravity, Urine: 1.008 (ref 1.005–1.030)
pH: 6 (ref 5.0–8.0)

## 2020-08-31 MED ORDER — OXYCODONE-ACETAMINOPHEN 5-325 MG PO TABS
1.0000 | ORAL_TABLET | ORAL | Status: AC | PRN
  Administered 2020-08-31 (×2): 1 via ORAL
  Filled 2020-08-31 (×3): qty 1

## 2020-08-31 MED ORDER — OXYCODONE-ACETAMINOPHEN 5-325 MG PO TABS: 1.0000 | ORAL_TABLET | Freq: Three times a day (TID) | ORAL | 0 refills | Status: DC | PRN

## 2020-08-31 NOTE — Discharge Summary (Signed)
RNCM placed several referrals for home health services.  Will update pt with name and number of agency.

## 2020-08-31 NOTE — ED Triage Notes (Signed)
Patient is s/p back surgery on 3/3, she had Lspine 2,3,4 and 5 discs replaced.  She has rods in her back.  Patient complaining of increased back pain, sciatic pain.  Pain intensified about 10 hours ago.  Patient took gabapentin for pain which did not help.

## 2020-08-31 NOTE — Progress Notes (Signed)
CSW met with Pt and family at bedside. Pt and husband are current residents at [facility].  CSW sent home health referral to [facility] as they are the sole provider of [facility] services for [facility] residents. CSW also added information to AVS. Pt and family have no other needs at this time.

## 2020-08-31 NOTE — ED Provider Notes (Signed)
[facility] EMERGENCY DEPARTMENT Provider Note   CSN: [id number] Arrival date & time: 08/31/20  0272     History Chief Complaint  Patient presents with  . Back Pain    Carol Perez is a 80 y.o. female.  HPI Patient presents with low back pain rating the left leg.  Had lumbar fusion by anterior and posterior approach done on March 3.  Had been at nursing home until a day or 2 ago.  Had been doing well.  States yesterday was the best day she had.  States she actually woke up hungry which was unusual.  Had been initially on tramadol then followed by Vicodin for pain control.  Has been doing well at home but states she thinks she overdid it taking care of her husband.  States she felt a sharp pain in her left back when she twisted and felt pain shooting down her leg with it.  It is similar to pain she has had before but more severe.  Had not been having this pain since the surgery.  No loss of bladder or bowel control.  Unrelieved with pain medicines at home.  States that she was told she may need a case manager to help at home.  Dr. Vertell Limber did the surgery.  No fevers or chills.  No loss of bladder or bowel control.  No rash.  Patient states that she is weak in her legs and would not be able to get up and walk around if she needed to.  States she was doing better yesterday.    Past Medical History:  Diagnosis Date  . Barrett's esophagus   . Colitis   . DDD (degenerative disc disease)   . Erroneous encounter - disregard    error   . Erroneous encounter - disregard   . Erroneous encounter - disregard   . Factor 5 Leiden mutation, heterozygous [facility])    Per [facility] Patient Packet.  . Frequent UTI   . H/O echocardiogram 04/01/2019   Per [place] Patient Packet.  . Hiatal hernia    Per [place] Patient Packet.  . High grade dysplasia of Barrett's epithelium    Per [facility] New Patient Packet.  Marland Kitchen History of bladder infections    Per [facility] New Patient Packet.  Marland Kitchen History of bone  density study 07/12/2018   Per [place] Patient Packet.  Marland Kitchen History of bone density study 07/12/2018   Up coming appointment 07/17/2019, Per [facility] New Patient Packet  . History of bone density study 07/17/2019  . History of endoscopy 12/17/2015   Needed Every 3 years. Scheduled for January 2021. Per [facility] New Patient Packet.   Marland Kitchen History of mammogram 04/14/2019   Per [place] Patient Packet.  Marland Kitchen History of MRI 03/01/2019   By Dr.Gulati/ Neurologist. MRI of Brain. Per [facility] New Patient Packet.  Marland Kitchen History of MRI 04/23/2019   By Dr. Maryan Rued at [facility]. Per [facility] New Patient Packet.  Marland Kitchen History of Papanicolaou smear of cervix 10/16/2011   Per [place] Patient Packet  . Hypotension    Per [facility] New Patient Packet.  . IBS (irritable bowel syndrome)    Per [place] New Patient Packet.  . Lactose intolerance   . Osteoarthritis    Per [facility] New Patient Packet.  Marland Kitchen Osteopenia    Per [place] New Patient Packet.  . Osteoporosis   . Premature ventricular complex   . Raynaud's disease    Per [facility] New Patient  Packet.  . Scoliosis    Per [place] New Patient Packet.    Patient Active Problem List   Diagnosis Date Noted  . Depression, recurrent ([place]) 08/24/2020  . Atrophic vaginitis 08/24/2020  . Blood loss anemia 08/24/2020  . Hyponatremia   . Lumbar scoliosis 08/12/2020  . Occipital neuralgia of right side 09/08/2019  . Recurrent UTI 06/26/2019  . Osteoporosis 06/26/2019  . IBS (irritable bowel syndrome) 06/26/2019  . Hyperlipidemia 06/26/2019  . Back pain 06/26/2019  . Barrett's esophagus 06/26/2019    Past Surgical History:  Procedure Laterality Date  . ABDOMINAL EXPOSURE N/A 08/12/2020   Procedure: ABDOMINAL EXPOSURE;  Surgeon: Serafina Mitchell, MD;  Location: [place];  Service: Vascular;  Laterality: N/A;  . ABDOMINAL HYSTERECTOMY  06/13/2011   By Dr[facility]Scherer at [facility]. Per [facility] New Patient Packet.  . ANTERIOR LAT LUMBAR FUSION Right 08/12/2020   Procedure: Right Lumbar two-three, Lumbar  three-four, Lumbar four-five Anterolateral lumbar interbody fusion;  Surgeon: Erline Levine, MD;  Location: [facility];  Service: Neurosurgery;  Laterality: Right;  . ANTERIOR LUMBAR FUSION N/A 08/12/2020   Procedure: Lumbar five Sacral one Anterior lumbar interbody fusion;  Surgeon: Erline Levine, MD;  Location: [place];  Service: Neurosurgery;  Laterality: N/A;  . BREAST EXCISIONAL BIOPSY Left 30+ yrs ago  . COLONOSCOPY  11/23/2011   By Dr.McCune at [facility]. Per [place] Patient Packet.  . COLONOSCOPY  08/26/2019  . LUMBAR PERCUTANEOUS PEDICLE SCREW 4 LEVEL N/A 08/12/2020   Procedure: Percutaneous pedicle screw fixation from Lumbar two to Sacral one ;  Surgeon: Erline Levine, MD;  Location: [facility];  Service: Neurosurgery;  Laterality: N/A;  . MAMMOGRAM  04/14/2019  . pap smear  10/16/2011  . SIGMOIDOSCOPY  11/02/2014   Per [place] New Patient Packet  . TONSILLECTOMY  06/12/1948   Per [place] New Patient Packet.     OB History   No obstetric history on file.     Family History  Problem Relation Age of Onset  . Cancer Mother   . Cancer Father     Social History   Tobacco Use  . Smoking status: Former Smoker    Start date: 06/12/1977  . Smokeless tobacco: Never Used  . Tobacco comment: 42 years ago. Smoked from age [age]  Vaping Use  . Vaping Use: Never used  Substance Use Topics  . Alcohol use: Not Currently    Alcohol/week: 7.0 standard drinks    Types: 7 Glasses of wine per week    Comment: was a glass of wine daily  . Drug use: No    Home Medications Prior to Admission medications   Medication Sig Start Date End Date Taking? Authorizing Provider  oxyCODONE-acetaminophen (PERCOCET/ROXICET) 5-325 MG tablet Take 1-2 tablets by mouth every 8 (eight) hours as needed for severe pain. 08/31/20  Yes Davonna Belling, MD  aspirin EC 81 MG tablet Take 81 mg by mouth daily with breakfast.    [provider]  B Complex-C (B-COMPLEX WITH VITAMIN C) tablet Take 1 tablet  by mouth daily.    [provider]  Biotin 5000 MCG TABS Take 5,000 mcg by mouth daily.    [provider]  CALCIUM PO Take 1,200 mg by mouth daily.     [provider]  Cholecalciferol (VITAMIN D3) 50 MCG (2000 UT) TABS Take 2,000 Units by mouth in the morning, at noon, and at bedtime.    [provider]  conjugated estrogens (PREMARIN) vaginal cream Place 1  Applicatorful vaginally 3 (three) times a week. 06/25/19   Virgie Dad, MD  escitalopram (LEXAPRO) 5 MG tablet Take 5 mg by mouth daily.    [provider]  Glucosamine HCl 1500 MG TABS Take 1,500 mg by mouth daily.    [provider]  Loperamide-Simethicone 2-125 MG TABS Take 1 tablet by mouth 4 (four) times daily as needed (diarrhea/loose stools).    [provider]  LORazepam (ATIVAN) 0.5 MG tablet Take 0.5 tablets (0.25 mg total) by mouth every 6 (six) hours as needed for up to 14 days for anxiety. Patient taking differently: Take 0.25 mg by mouth every 4 (four) hours as needed for anxiety. 08/25/20 09/08/20  Virgie Dad, MD  methenamine (HIPREX) 1 g tablet Take 1 g by mouth 2 (two) times daily with a meal. For UTI Prevention. Last UTI: October 20th, November 5th, and December 28th.    [provider]  Methenamine-Sodium Salicylate (CYSTEX PO) Take 100 mg by mouth daily. ( Cranberry Concentrate )    [provider]  methocarbamol (ROBAXIN) 500 MG tablet Take 1 tablet (500 mg total) by mouth every 6 (six) hours as needed for muscle spasms. 08/23/20   Marvis Moeller, NP  Multiple Vitamin (MULTIVITAMIN WITH MINERALS) TABS tablet Take 1 tablet by mouth daily. One A Day for Women    [provider]  NON FORMULARY CBD Full spectrum 1 ml as needed for sleep.    [provider]  Omeprazole-Sodium Bicarbonate (ZEGERID PO) Take 20 mg by mouth 2 (two) times daily.    [provider]  Polyethyl Glycol-Propyl Glycol (SYSTANE) 0.4-0.3 % SOLN  Place 1 drop into both eyes as needed (dry/irritated eyes).    [provider]  Probiotic Product (PROBIOTIC COLON SUPPORT PO) Take 1 tablet by mouth in the morning and at bedtime.    [provider]  valACYclovir (VALTREX) 500 MG tablet TAKE (1) TABLET DAILY AS NEEDED. 03/08/20   Virgie Dad, MD  vitamin C (ASCORBIC ACID) 500 MG tablet Take 500 mg by mouth daily.    [provider]  zinc oxide 20 % ointment Apply 1 application topically as needed for irritation.    [provider]    Allergies    Banana, Latex, Ciprofloxacin hcl, Gatifloxacin, Levofloxacin, Moxifloxacin, Norfloxacin, Nsaids, Ofloxacin, Other, Penicillins, Methylisothiazolinone, and Tape  Review of Systems   Review of Systems  Constitutional: Negative for appetite change.  HENT: Negative for congestion.   Respiratory: Negative for shortness of breath.   Cardiovascular: Negative for chest pain.  Gastrointestinal: Negative for abdominal pain.  Musculoskeletal: Positive for back pain. Negative for neck pain.  Neurological: Negative for weakness.  Psychiatric/Behavioral: Negative for confusion.    Physical Exam Updated Vital Signs BP (!) 140/59   Pulse 77   Temp 98.4 F (36.9 C) (Oral)   Resp 11   SpO2 98%   Physical Exam HENT:     Head: Atraumatic.     Mouth/Throat:     Mouth: Mucous membranes are moist.  Eyes:     Pupils: Pupils are equal, round, and reactive to light.  Cardiovascular:     Rate and Rhythm: Regular rhythm.  Pulmonary:     Breath sounds: No wheezing or rhonchi.  Abdominal:     Comments: Dressing left mid abdomen from recent surgery.  Only mild abdominal tenderness.  Musculoskeletal:     Cervical back: Neck supple.     Comments: 2 healing vertical surgical wounds on either  side of the lumbar spine.  Some mild bruising.  No real erythema.  Skin:    General: Skin is dry.  Neurological:     Mental Status: She is alert and oriented to person, place, and  time.     Comments: Good flexion and extension of the ankle.  Minimal pain with straight leg raise.  Sensation grossly intact over lower extremities.     ED Results / Procedures / Treatments   Labs (all labs ordered are listed, but only abnormal results are displayed) Labs Reviewed  CBC WITH DIFFERENTIAL/PLATELET - Abnormal; Notable for the following components:      Result Value   RBC 2.74 (*)    Hemoglobin 9.2 (*)    HCT 28.1 (*)    MCV 102.6 (*)    Platelets 498 (*)    All other components within normal limits  COMPREHENSIVE METABOLIC PANEL - Abnormal; Notable for the following components:   Glucose, Bld 109 (*)    Calcium 8.8 (*)    Total Protein 5.9 (*)    Albumin 3.4 (*)    All other components within normal limits  URINALYSIS, ROUTINE W REFLEX MICROSCOPIC    EKG None  Radiology DG Lumbar Spine 2-3 Views  Result Date: 08/31/2020 CLINICAL DATA:  Twisted back. EXAM: LUMBAR SPINE - 2-3 VIEW COMPARISON:  08/12/2020.  MRI lumbar spine 08/05/2019. FINDINGS: Lumbar spine numbered as per prior MRI. L2 through S1 posterior and interbody fusion. Hardware intact. Diffuse degenerative change. No acute bony abnormality identified. IMPRESSION: L2 through S1 posterior interbody fusion. Hardware intact. Diffuse degenerative change. No acute abnormality identified. Electronically Signed   By: Marcello Moores  Register   On: 08/31/2020 12:44    Procedures Procedures   Medications Ordered in ED Medications  oxyCODONE-acetaminophen (PERCOCET/ROXICET) 5-325 MG per tablet 1 tablet (1 tablet Oral Given 08/31/20 1236)    ED Course  I have reviewed the triage vital signs and the nursing notes.  Pertinent labs & imaging results that were available during my care of the patient were reviewed by me and considered in my medical decision making (see chart for details).    MDM Rules/Calculators/A&P                          Patient presents with low back pain post back surgery.  Had had some  difficulty controlling the pain at rehab but went home pretty well controlled.  However think she overdid it taking care of her husband.  Felt a sharp pain in her back that went down her leg.  Has been seen in the ER by neurosurgery.  X-ray done in reassuring.  They think just needs adjustment of her pain medications.  I have changed to oxycodone which she did well here with. However patient had not been doing well at home having take care of her husband.  Will check with transitions of care to potentially order some home health.  Otherwise cleared for discharge home Final Clinical Impression(s) / ED Diagnoses Final diagnoses:  Post-operative pain    Rx / DC Orders ED Discharge Orders         Ordered    oxyCODONE-acetaminophen (PERCOCET/ROXICET) 5-325 MG tablet  Every 8 hours PRN        08/31/20 1438           Davonna Belling, MD 08/31/20 1439

## 2020-08-31 NOTE — ED Notes (Signed)
Moved to ED 48 via EDT, son at bedside

## 2020-08-31 NOTE — ED Notes (Signed)
Dr Pickering at bedside 

## 2020-08-31 NOTE — ED Notes (Signed)
As per triage: "Patient is s/p back surgery on 3/3, she had Lspine 2,3,4 and 5 discs replaced.  She has rods in her back.  Patient complaining of increased back pain, sciatic pain." Also c/o weakness, "just don't have any strength", states had bad knife pain yesterday afternoon when she made a turn, pain down her left side

## 2020-08-31 NOTE — Consult Note (Addendum)
Reason for Consult:Pickering, Ovid Curd, MD Referring Physician: Low back pain and LLE radiculopathy    HPI: Carol Perez is an 80 y.o. who is s/p ALIF L5/S1, XLIF L2/3, L3/4, and L4/5 with percutaneous screw fixation at L2-S1 on 08/12/2020. The patient was discharged to [facility] on 08/23/2020 for rehab. She reports doing a very negative experience at rehab and required her husband to actively advocate for her needs during her time in rehab. Per the patient, She left rehab 2 days ago against the rehab units advice due to the poor contortions. She reported that she was doing well and improving. However, yesterday she noticed her husband was "dragging" and proceeded to try and help him with tasks around the house. She stated that she significantly increased her activity and then felt an abnormal feeling ion her back after a twisting motion and had an acute onset of severe low back pain that radiated down in left leg and into the dorsum of her left foot. The pain was reported to be worse than it was preoperatively. She tied to manage her symptoms at home with Tramadol and gabapentin, but to no avail. Due to the intractable pain she was experiencing, she presented to the ED for evaluation.      Past Medical History:  Diagnosis Date  . Barrett's esophagus   . Colitis   . DDD (degenerative disc disease)   . Erroneous encounter - disregard    error   . Erroneous encounter - disregard   . Erroneous encounter - disregard   . Factor 5 Leiden mutation, heterozygous [facility])    Per [place] [facility].  . Frequent UTI   . H/O echocardiogram 04/01/2019   Per [place] [facility].  . Hiatal hernia    Per [place] [facility] Packet.  . High grade dysplasia of Barrett's epithelium    Per [facility] New Patient Packet.  Marland Kitchen History of bladder infections    Per [facility] New Patient Packet.  Marland Kitchen History of bone density study 07/12/2018   Per [place] Patient Packet.  Marland Kitchen History of bone density study 07/12/2018   Up  coming appointment 07/17/2019, Per [facility] New Patient Packet  . History of bone density study 07/17/2019  . History of endoscopy 12/17/2015   Needed Every 3 years. Scheduled for January 2021. Per [facility] New Patient Packet.   Marland Kitchen History of mammogram 04/14/2019   Per [place] Patient Packet.  Marland Kitchen History of MRI 03/01/2019   By Dr.Gulati/ Neurologist. MRI of Brain. Per [facility] New Patient Packet.  Marland Kitchen History of MRI 04/23/2019   [facility] Dr[facility] Maryan Rued at [facility]. Per [facility] New Patient Packet.  Marland Kitchen History of Papanicolaou smear of cervix 10/16/2011   Per [place] Patient Packet  . Hypotension    Per [facility] New Patient Packet.  . IBS (irritable bowel syndrome)    Per [place] New Patient Packet.  . Lactose intolerance   . Osteoarthritis    Per [facility] New Patient Packet.  Marland Kitchen Osteopenia    Per [place] New Patient Packet.  . Osteoporosis   . Premature ventricular complex   . Raynaud's disease    Per [facility] New Patient Packet.  . Scoliosis    Per [place] New Patient Packet.    Past Surgical History:  Procedure Laterality Date  . ABDOMINAL EXPOSURE N/A 08/12/2020   Procedure: ABDOMINAL EXPOSURE;  Surgeon: Serafina Mitchell, MD;  Location: [place];  Service: Vascular;  Laterality: N/A;  . ABDOMINAL HYSTERECTOMY  06/13/2011   By Dr.Scherer at [facility]. Per [facility] New Patient Packet.  . ANTERIOR LAT LUMBAR FUSION Right 08/12/2020   Procedure: Right Lumbar two-three, Lumbar three-four, Lumbar four-five Anterolateral lumbar interbody fusion;  Surgeon: Erline Levine, MD;  Location: [place];  Service: Neurosurgery;  Laterality: Right;  . ANTERIOR LUMBAR FUSION N/A 08/12/2020   Procedure: Lumbar five Sacral one Anterior lumbar interbody fusion;  Surgeon: Erline Levine, MD;  Location: [place];  Service: Neurosurgery;  Laterality: N/A;  . BREAST EXCISIONAL BIOPSY Left 30+ yrs ago  . COLONOSCOPY  11/23/2011   By Dr.McCune at [facility]. Per [place] Patient Packet.  . COLONOSCOPY  08/26/2019  . LUMBAR PERCUTANEOUS PEDICLE  SCREW 4 LEVEL N/A 08/12/2020   Procedure: Percutaneous pedicle screw fixation from Lumbar two to Sacral one ;  Surgeon: Erline Levine, MD;  Location: [place];  Service: Neurosurgery;  Laterality: N/A;  . MAMMOGRAM  04/14/2019  . pap smear  10/16/2011  . SIGMOIDOSCOPY  11/02/2014   Per [place] New Patient Packet  . TONSILLECTOMY  06/12/1948   Per [place] New Patient Packet.    Family History  Problem Relation Age of Onset  . Cancer Mother   . Cancer Father     Social History:  reports that she has quit smoking. She started smoking about 43 years ago. She has never used smokeless tobacco. She reports previous alcohol use of about 7.0 standard drinks of alcohol per week. She reports that she does not use drugs.  Allergies:  Allergies  Allergen Reactions  . Banana Swelling    Swelling around mouth and eyes and red blotches  . Latex Swelling, Rash and Other (See Comments)  . Ciprofloxacin Hcl Other (See Comments)    Severe knee inflammation   . Gatifloxacin Other (See Comments)    Severe knee inflammation   . Levofloxacin Other (See Comments)    Severe knee inflammation   . Moxifloxacin Other (See Comments)    Severe knee inflammation   . Norfloxacin Other (See Comments)    Severe knee inflammation   . Nsaids Other (See Comments)    Upsets ulcers  . Ofloxacin Other (See Comments)    Severe knee inflammation  . Other Other (See Comments)    FLOXIN  . Penicillins Hives    Tolerates (KEFLEX-cephalexin)   . Methylisothiazolinone Rash and Other (See Comments)    A preservative found in a cream pt used  . Tape Rash    Medications: I have reviewed the patient's current medications.  Results for orders placed or performed during the hospital encounter of 08/31/20 (from the past 48 hour(s))  Urinalysis, Routine w reflex microscopic Urine, Clean Catch     Status: None   Collection Time: 08/31/20  3:20 AM  Result Value Ref Range   Color, Urine YELLOW YELLOW   APPearance CLEAR CLEAR    Specific Gravity, Urine 1.008 1.005 - 1.030   pH 6.0 5.0 - 8.0   Glucose, UA NEGATIVE NEGATIVE mg/dL   Hgb urine dipstick NEGATIVE NEGATIVE   Bilirubin Urine NEGATIVE NEGATIVE   Ketones, ur NEGATIVE NEGATIVE mg/dL   Protein, ur NEGATIVE NEGATIVE mg/dL   Nitrite NEGATIVE NEGATIVE   Leukocytes,Ua NEGATIVE NEGATIVE    Comment: Performed at [facility] [address] [place], [place] [address]  CBC with Differential     Status: Abnormal   Collection Time: 08/31/20  3:46 AM  Result Value Ref Range   WBC 8.4 4.0 - 10.5 K/uL  RBC 2.74 (L) 3.87 - 5.11 MIL/uL   Hemoglobin 9.2 (L) 12.0 - 15.0 g/dL   HCT 28.1 (L) 36.0 - 46.0 %   MCV 102.6 (H) 80.0 - 100.0 fL   MCH 33.6 26.0 - 34.0 pg   MCHC 32.7 30.0 - 36.0 g/dL   RDW 14.4 11.5 - 15.5 %   Platelets 498 (H) 150 - 400 K/uL   nRBC 0.0 0.0 - 0.2 %   Neutrophils Relative % 66 %   Neutro Abs 5.7 1.7 - 7.7 K/uL   Lymphocytes Relative 20 %   Lymphs Abs 1.7 0.7 - 4.0 K/uL   Monocytes Relative 11 %   Monocytes Absolute 0.9 0.1 - 1.0 K/uL   Eosinophils Relative 1 %   Eosinophils Absolute 0.0 0.0 - 0.5 K/uL   Basophils Relative 1 %   Basophils Absolute 0.1 0.0 - 0.1 K/uL   Immature Granulocytes 1 %   Abs Immature Granulocytes 0.06 0.00 - 0.07 K/uL    Comment: Performed at [facility] [address] [place], [place] [address]  Comprehensive metabolic panel     Status: Abnormal   Collection Time: 08/31/20  3:46 AM  Result Value Ref Range   Sodium 138 135 - 145 mmol/L   Potassium 3.8 3.5 - 5.1 mmol/L   Chloride 103 98 - 111 mmol/L   CO2 28 22 - 32 mmol/L   Glucose, Bld 109 (H) 70 - 99 mg/dL    Comment: Glucose reference range applies only to samples taken after fasting for at least 8 hours.   BUN 11 8 - 23 mg/dL   Creatinine, Ser 0.59 0.44 - 1.00 mg/dL   Calcium 8.8 (L) 8.9 - 10.3 mg/dL   Total Protein 5.9 (L) 6.5 - 8.1 g/dL   Albumin 3.4 (L) 3.5 - 5.0 g/dL   AST 24 15 - 41 U/L   ALT 19 0 - 44 U/L   Alkaline Phosphatase  96 38 - 126 U/L   Total Bilirubin 0.6 0.3 - 1.2 mg/dL   GFR, Estimated >60 >60 mL/min    Comment: (NOTE) Calculated using the CKD-EPI Creatinine Equation (2021)    Anion gap 7 5 - 15    Comment: Performed at [facility], [place] [address] [place], [place] [address]    No results found.  Review of Systems  Constitutional: Negative for appetite change, chills, diaphoresis, fever and unexpected weight change.  HENT: Negative.   Respiratory: Negative.   Cardiovascular: Negative.   Gastrointestinal: Negative.   Endocrine: Negative.   Genitourinary: Negative.   Musculoskeletal: Positive for back pain. Negative for arthralgias.  Skin: Positive for wound.  Neurological: Negative.   Psychiatric/Behavioral: Negative.    Blood pressure 137/62, pulse 78, temperature 98.4 F (36.9 C), temperature source Oral, resp. rate 13, SpO2 100 %. Physical Exam Constitutional:      Appearance: Normal appearance. She is normal weight.  HENT:     Head: Normocephalic and atraumatic.  Eyes:     Extraocular Movements: Extraocular movements intact.     Conjunctiva/sclera: Conjunctivae normal.     Pupils: Pupils are equal, round, and reactive to light.  Cardiovascular:     Rate and Rhythm: Normal rate and regular rhythm.  Pulmonary:     Effort: Pulmonary effort is normal.     Breath sounds: Normal breath sounds.  Abdominal:     General: Abdomen is flat. Bowel sounds are normal.  Musculoskeletal:        General: Tenderness present. Normal  range of motion.  Neurological:     General: No focal deficit present.     Mental Status: She is alert and oriented to person, place, and time.     Cranial Nerves: No cranial nerve deficit.     Sensory: No sensory deficit.     Motor: No weakness.     Deep Tendon Reflexes: Reflexes normal.  Psychiatric:        Mood and Affect: Mood normal.        Behavior: Behavior normal.        Thought Content: Thought content normal.        Judgment: Judgment normal.      Assessment/Plan: 80 year old female who is s/p ALIF L5/S1, XLIF L2/3, L3/4, and L4/5 with percutaneous screw fixation at L2-S1 on 08/12/2020. She was recovering well until yesterday when she had an acute exacerbation of low back pain and LLE radiculopathy that is likely due to increasing activity to rapidly and overdoing it. On examination she has full strength on confrontational testing and intact sensation. Negative laying SLR b/l. Lumbar radiographs revealed L2 through S1 posterior interbody fusion with intact hardware. No acute fractures or structural abnormalities were noted. I feel as though the patient's symptoms will improve with time and rest. Continue to modify activities and work on pain control.    1. A/P and lateral lumbar radiographs 2. Percocet 5/325 mg 1-2 tablets QID PRN 3. Robaxin 500 mg QID PRN 4. Can continue to utilize gabapentin PRN 5. Activity modifications 6. Home PT/OT  7. Follow up in the office in 4 weeks with A/P and lateral lumbar radiographs  Marvis Moeller, DNP, NP-C 08/31/2020, 12:35 PM

## 2020-09-02 ENCOUNTER — Telehealth: Payer: Self-pay

## 2020-09-02 ENCOUNTER — Telehealth: Payer: Self-pay | Admitting: *Deleted

## 2020-09-02 MED ORDER — CEFPODOXIME PROXETIL 200 MG PO TABS: 200.0000 mg | ORAL_TABLET | Freq: Two times a day (BID) | ORAL | 0 refills | Status: AC

## 2020-09-02 NOTE — Telephone Encounter (Signed)
Patient called and stated that she was in Healthcare and sent back to her apartment. Stated that she was not sent back with her antibiotics that was given to her for a UTI and she had only taken it for 3 days.  Stated that she feels like the UTI is back and is requesting the antibiotic to be called to her pharmacy so she can finish the course.   Please Advise.

## 2020-09-02 NOTE — Telephone Encounter (Signed)
CSW called [facility] in an effort to confirm that services have been initiated for Pt

## 2020-09-02 NOTE — Telephone Encounter (Signed)
Please let her know that I have ordered 5 more days of her Antibiotics to [facility]

## 2020-09-02 NOTE — Telephone Encounter (Signed)
Patient notified and agreed.  

## 2020-09-03 ENCOUNTER — Telehealth: Payer: Self-pay

## 2020-09-03 NOTE — Telephone Encounter (Signed)
This CSW received confirmation via CSW [place] that [facility] has initiated [facility] services for Pt

## 2020-09-08 ENCOUNTER — Encounter: Payer: Self-pay | Admitting: Internal Medicine

## 2020-09-08 ENCOUNTER — Non-Acute Institutional Stay: Payer: Medicare Other | Admitting: Internal Medicine

## 2020-09-08 ENCOUNTER — Other Ambulatory Visit: Payer: Self-pay

## 2020-09-08 VITALS — BP 126/78 | HR 84 | Temp 96.6°F | Ht 62.0 in | Wt 127.1 lb

## 2020-09-08 DIAGNOSIS — F339 Major depressive disorder, recurrent, unspecified: Secondary | ICD-10-CM | POA: Diagnosis not present

## 2020-09-08 DIAGNOSIS — M549 Dorsalgia, unspecified: Secondary | ICD-10-CM

## 2020-09-08 DIAGNOSIS — D500 Iron deficiency anemia secondary to blood loss (chronic): Secondary | ICD-10-CM

## 2020-09-08 DIAGNOSIS — K52832 Lymphocytic colitis: Secondary | ICD-10-CM

## 2020-09-08 DIAGNOSIS — E871 Hypo-osmolality and hyponatremia: Secondary | ICD-10-CM

## 2020-09-08 DIAGNOSIS — N390 Urinary tract infection, site not specified: Secondary | ICD-10-CM

## 2020-09-08 DIAGNOSIS — Z9889 Other specified postprocedural states: Secondary | ICD-10-CM

## 2020-09-08 MED ORDER — ESCITALOPRAM OXALATE 5 MG PO TABS: 5.0000 mg | ORAL_TABLET | Freq: Every day | ORAL | 3 refills | Status: DC

## 2020-09-08 MED ORDER — METHOCARBAMOL 500 MG PO TABS: 500.0000 mg | ORAL_TABLET | Freq: Three times a day (TID) | ORAL | 0 refills | Status: DC | PRN

## 2020-09-08 NOTE — Progress Notes (Signed)
Location: [place] of Service:  Clinic (12)  Provider:   Code Status: Full Code Goals of Care:  Advanced Directives 08/25/2020  Does Patient Have a Medical Advance Directive? Yes  Type of Paramedic of [place];Living will  Does patient want to make changes to medical advance directive? -  Copy of [place] in Chart? No - copy requested  Would patient like information on creating a medical advance directive? -     Chief Complaint  Patient presents with  . Medical Management of Chronic Issues    HPI: Patient is a 80 y.o. female seen today for an acute visit for Follow up of Her discharge from SNF  Patient was admitted in the hospital from 3/3-3/14 underwent decompression and fusion of her lumbar spine.2-3, 3-4, 4-5  Patient has a history ofrecurrent UTI prophylaxis with Hip-Rex History of depression, h/o osteoporosis History of lymphocytic colitis was not able to tolerate budesonide History of anxiety attacks versus TIA Previous work-up with neurology including MRI. EEG has been negative  Was electively admitted for lumbar spine fusion for lumbar scoliosis and stenosis Postop was complicated with acute metabolic encephalopathy and hyponatremia Her hyponatremia was thought to be due to poor p.o. intake. Her trazodone and gabapentin were stopped to see reason for SIADH. She was treated with hypertonic saline  Due to disagreement with the staff here in SNF patient decided to finish her Therapy in her apartment She was started on prednisonetaper by Dr. Vertell Limber  for her severe pain in the back..  Today is her last day of prednisone.  She states she feels really well since the prednisone was started her pain has improved considerably. She is walking with a walker wearing belt for her back. Is not taking oxycodone anymore.  Wants to know if she can have a prescription for Robaxin.  Is taking 400 mg of Neurontin at  night. Also will wants to restart antidepressant Still Has poor Appetite  Past Medical History:  Diagnosis Date  . Barrett's esophagus   . Colitis   . DDD (degenerative disc disease)   . Erroneous encounter - disregard    error   . Erroneous encounter - disregard   . Erroneous encounter - disregard   . Factor 5 Leiden mutation, heterozygous [facility])    Per [place] Patient Packet.  . Frequent UTI   . H/O echocardiogram 04/01/2019   Per [place] Patient Packet.  . Hiatal hernia    Per [place] Patient Packet.  . High grade dysplasia of Barrett's epithelium    Per [facility] New Patient Packet.  Marland Kitchen History of bladder infections    Per [facility] New Patient Packet.  Marland Kitchen History of bone density study 07/12/2018   Per [place] Patient Packet.  Marland Kitchen History of bone density study 07/12/2018   Up coming appointment 07/17/2019, Per [facility] New Patient Packet  . History of bone density study 07/17/2019  . History of endoscopy 12/17/2015   Needed Every 3 years. Scheduled for January 2021. Per [facility] New Patient Packet.   Marland Kitchen History of mammogram 04/14/2019   Per [place] Patient Packet.  Marland Kitchen History of MRI 03/01/2019   By Dr.Gulati/ Neurologist. MRI of Brain. Per [facility] New Patient Packet.  Marland Kitchen History of MRI 04/23/2019   By Dr. Maryan Rued at [facility]. Per [facility] New Patient Packet.  Marland Kitchen History of Papanicolaou smear of cervix 10/16/2011   Per [place] Patient Packet  .  Hypotension    Per [facility] New Patient Packet.  . IBS (irritable bowel syndrome)    Per [facility] New Patient Packet.  . Lactose intolerance   . Osteoarthritis    Per [facility] New Patient Packet.  Marland Kitchen Osteopenia    Per [facility] New Patient Packet.  . Osteoporosis   . Premature ventricular complex   . Raynaud's disease    Per [facility] New Patient Packet.  . Scoliosis    Per [place] New Patient Packet.    Past Surgical History:  Procedure Laterality Date  . ABDOMINAL EXPOSURE N/A 08/12/2020   Procedure: ABDOMINAL EXPOSURE;  Surgeon: Serafina Mitchell, MD;  Location: [place];   Service: Vascular;  Laterality: N/A;  . ABDOMINAL HYSTERECTOMY  06/13/2011   By Dr.Scherer at [facility]. Per [facility] New Patient Packet.  . ANTERIOR LAT LUMBAR FUSION Right 08/12/2020   Procedure: Right Lumbar two-three, Lumbar three-four, Lumbar four-five Anterolateral lumbar interbody fusion;  Surgeon: Erline Levine, MD;  Location: [place];  Service: Neurosurgery;  Laterality: Right;  . ANTERIOR LUMBAR FUSION N/A 08/12/2020   Procedure: Lumbar five Sacral one Anterior lumbar interbody fusion;  Surgeon: Erline Levine, MD;  Location: [place];  Service: Neurosurgery;  Laterality: N/A;  . BREAST EXCISIONAL BIOPSY Left 30+ yrs ago  . COLONOSCOPY  11/23/2011   By Dr.McCune at [facility]. Per [place] Patient Packet.  . COLONOSCOPY  08/26/2019  . LUMBAR PERCUTANEOUS PEDICLE SCREW 4 LEVEL N/A 08/12/2020   Procedure: Percutaneous pedicle screw fixation from Lumbar two to Sacral one ;  Surgeon: Erline Levine, MD;  Location: [place];  Service: Neurosurgery;  Laterality: N/A;  . MAMMOGRAM  04/14/2019  . pap smear  10/16/2011  . SIGMOIDOSCOPY  11/02/2014   Per [facility] New Patient Packet  . TONSILLECTOMY  06/12/1948   Per [facility] New Patient Packet.    Allergies  Allergen Reactions  . Banana Swelling    Swelling around mouth and eyes and red blotches  . Latex Swelling, Rash and Other (See Comments)  . Ciprofloxacin Hcl Other (See Comments)    Severe knee inflammation   . Gatifloxacin Other (See Comments)    Severe knee inflammation   . Levofloxacin Other (See Comments)    Severe knee inflammation   . Moxifloxacin Other (See Comments)    Severe knee inflammation   . Norfloxacin Other (See Comments)    Severe knee inflammation   . Nsaids Other (See Comments)    Upsets ulcers  . Ofloxacin Other (See Comments)    Severe knee inflammation  . Other Other (See Comments)    FLOXIN  . Penicillins Hives    Tolerates (KEFLEX-cephalexin)   . Methylisothiazolinone Rash and Other (See Comments)     A preservative found in a cream pt used  . Tape Rash    Outpatient Encounter Medications as of 09/08/2020  Medication Sig  . aspirin EC 81 MG tablet Take 81 mg by mouth daily with breakfast.  . B Complex-C (B-COMPLEX WITH VITAMIN C) tablet Take 1 tablet by mouth daily.  . Biotin 5000 MCG TABS Take 5,000 mcg by mouth daily.  Marland Kitchen CALCIUM PO Take 1,200 mg by mouth daily.   . Cholecalciferol (VITAMIN D3) 50 MCG (2000 UT) TABS Take 2,000 Units by mouth in the morning, at noon, and at bedtime.  . conjugated estrogens (PREMARIN) vaginal cream Place 1 Applicatorful vaginally 3 (three) times a week.  . Glucosamine HCl 1500 MG TABS Take 1,500 mg by mouth daily.  . Loperamide-Simethicone 2-125  MG TABS Take 1 tablet by mouth 4 (four) times daily as needed (diarrhea/loose stools).  . methenamine (HIPREX) 1 g tablet Take 1 g by mouth 2 (two) times daily with a meal. For UTI Prevention. Last UTI: October 20th, November 5th, and December 28th.  . Methenamine-Sodium Salicylate (CYSTEX PO) Take 100 mg by mouth daily. ( Cranberry Concentrate )  . Multiple Vitamin (MULTIVITAMIN WITH MINERALS) TABS tablet Take 1 tablet by mouth daily. One A Day for Women  . NON FORMULARY CBD Full spectrum 1 ml as needed for sleep.  Marland Kitchen Omeprazole-Sodium Bicarbonate (ZEGERID PO) Take 20 mg by mouth 2 (two) times daily.  Vladimir Faster Glycol-Propyl Glycol (SYSTANE) 0.4-0.3 % SOLN Place 1 drop into both eyes as needed (dry/irritated eyes).  . Probiotic Product (PROBIOTIC COLON SUPPORT PO) Take 1 tablet by mouth in the morning and at bedtime.  . valACYclovir (VALTREX) 500 MG tablet TAKE (1) TABLET DAILY AS NEEDED.  Marland Kitchen escitalopram (LEXAPRO) 5 MG tablet Take 1 tablet (5 mg total) by mouth daily.  Marland Kitchen LORazepam (ATIVAN) 0.5 MG tablet Take 0.5 tablets (0.25 mg total) by mouth every 6 (six) hours as needed for up to 14 days for anxiety. (Patient taking differently: Take 0.25 mg by mouth every 4 (four) hours as needed for anxiety.)  .  methocarbamol (ROBAXIN) 500 MG tablet Take 1 tablet (500 mg total) by mouth every 8 (eight) hours as needed for muscle spasms. Use Half Tablet and if that does not help can take full  . oxyCODONE-acetaminophen (PERCOCET/ROXICET) 5-325 MG tablet Take 1-2 tablets by mouth every 8 (eight) hours as needed for severe pain. (Patient not taking: No sig reported)  . [DISCONTINUED] escitalopram (LEXAPRO) 5 MG tablet Take 5 mg by mouth daily. (Patient not taking: Reported on 09/08/2020)  . [DISCONTINUED] methocarbamol (ROBAXIN) 500 MG tablet Take 1 tablet (500 mg total) by mouth every 6 (six) hours as needed for muscle spasms. (Patient not taking: Reported on 09/08/2020)  . [DISCONTINUED] vitamin C (ASCORBIC ACID) 500 MG tablet Take 500 mg by mouth daily.  . [DISCONTINUED] zinc oxide 20 % ointment Apply 1 application topically as needed for irritation.   No facility-administered encounter medications on file as of 09/08/2020.    Review of Systems:  Review of Systems  Constitutional: Positive for activity change and appetite change.  HENT: Negative.   Respiratory: Negative.   Cardiovascular: Negative.   Gastrointestinal: Negative.   Genitourinary: Negative.   Musculoskeletal: Positive for arthralgias, back pain, gait problem and myalgias.  Skin: Negative.   Neurological: Positive for weakness.  Psychiatric/Behavioral: Positive for dysphoric mood and sleep disturbance.    Health Maintenance  Topic Date Due  . Hepatitis C Screening  Never done  . PNA vac Low Risk Adult (2 of 2 - PPSV23) 12/12/2015  . TETANUS/TDAP  04/08/2018  . INFLUENZA VACCINE  01/11/2020  . COVID-19 Vaccine (3 - Booster for Moderna series) 01/11/2020  . DEXA SCAN  Completed  . HPV VACCINES  Aged Out    Physical Exam: Vitals:   09/08/20 1454  BP: 126/78  Pulse: 84  Temp: (!) 96.6 F (35.9 C)  SpO2: 100%  Weight: 127 lb 1.6 oz (57.7 kg)  Height: 5\' 2"  (1.575 m)   Body mass index is 23.25 kg/m. Physical Exam   Constitutional: Oriented to person, place, and time. Well-developed and well-nourished.  HENT:  Head: Normocephalic.  Mouth/Throat: Oropharynx is clear and moist.  Eyes: Pupils are equal, round, and reactive to light.  Neck: Neck supple.  Cardiovascular: Normal rate and normal heart sounds.  No murmur heard. Pulmonary/Chest: Effort normal and breath sounds normal. No respiratory distress. No wheezes. She has no rales.  Abdominal: Soft. Bowel sounds are normal. No distension. There is no tenderness. There is no rebound.  Musculoskeletal: No edema.  Lymphadenopathy: none Neurological: Alert and oriented to person, place, and time.  Walked with her walker and Steady gait Skin: Skin is warm and dry.  Psychiatric: Normal mood and affect. Behavior is normal. Thought content normal.   Labs reviewed: Basic Metabolic Panel: Recent Labs    11/19/19 0929 08/09/20 1530 08/17/20 1543 08/17/20 1804 08/21/20 0416 08/21/20 1259 08/22/20 0415 08/23/20 0158 08/31/20 0346  NA 142   < > 115*   < > 131*   < > 135 136 138  K 3.9   < > 4.7   < > 4.3  --  4.2 4.2 3.8  CL 103   < > 83*   < > 94*  --  98 101 103  CO2 29   < > 25   < > 29  --  30 27 28   GLUCOSE 82   < > 103*   < > 180*  --  89 110* 109*  BUN 11   < > 8   < > 20  --  15 12 11   CREATININE 0.81   < > 0.46   < > 0.62  --  0.68 0.64 0.59  CALCIUM 9.5   < > 7.5*   < > 8.7*  --  8.3* 8.7* 8.8*  MG 2.1  --   --    < > 1.7  --  1.7 1.9  --   TSH 2.44  --  1.837  --   --   --   --   --   --    < > = values in this interval not displayed.   Liver Function Tests: Recent Labs    11/19/19 0929 08/31/20 0346  AST 20 24  ALT 15 19  ALKPHOS  --  96  BILITOT 0.4 0.6  PROT 5.9* 5.9*  ALBUMIN  --  3.4*   No results for input(s): LIPASE, AMYLASE in the last 8760 hours. No results for input(s): AMMONIA in the last 8760 hours. CBC: Recent Labs    11/19/19 0929 08/09/20 1530 08/22/20 0415 08/23/20 0158 08/31/20 0346  WBC 6.2   < >  20.0* 19.5* 8.4  NEUTROABS 3,422  --   --   --  5.7  HGB 12.9   < > 8.2* 8.6* 9.2*  HCT 38.7   < > 25.1* 26.2* 28.1*  MCV 99.2   < > 100.4* 100.8* 102.6*  PLT 270   < > 491* 489* 498*   < > = values in this interval not displayed.   Lipid Panel: No results for input(s): CHOL, HDL, LDLCALC, TRIG, CHOLHDL, LDLDIRECT in the last 8760 hours. No results found for: HGBA1C  Procedures since last visit: DG Lumbar Spine 2-3 Views  Result Date: 08/31/2020 CLINICAL DATA:  Twisted back. EXAM: LUMBAR SPINE - 2-3 VIEW COMPARISON:  08/12/2020.  MRI lumbar spine 08/05/2019. FINDINGS: Lumbar spine numbered as per prior MRI. L2 through S1 posterior and interbody fusion. Hardware intact. Diffuse degenerative change. No acute bony abnormality identified. IMPRESSION: L2 through S1 posterior interbody fusion. Hardware intact. Diffuse degenerative change. No acute abnormality identified. Electronically Signed   By: Marcello Moores  Register   On: 08/31/2020 12:44   DG Lumbar Spine 2-3  Views  Result Date: 08/12/2020 CLINICAL DATA:  Lumbar surgery. EXAM: LUMBAR SPINE - 2-3 VIEW; DG C-ARM 1-60 MIN COMPARISON:  Prior study same day.  MRI 03/04/2020. FINDINGS: Lumbar spine numbered as per prior MRI. Postsurgical changes noted of the lumbosacral spine with L2 through S1 posterior and interbody fusion. L5-S1 anterior fusion. 3 minutes 47 seconds fluoroscopy time. 69.46 mGy. IMPRESSION: Postsurgical changes lumbosacral spine. Electronically Signed   By: Marcello Moores  Register   On: 08/12/2020 16:31   DG Lumbar Spine 2-3 Views  Result Date: 08/12/2020 CLINICAL DATA:  Lumbar surgery. EXAM: LUMBAR SPINE - 2-3 VIEW COMPARISON:  MRI 03/04/2020. FINDINGS: Postsurgical changes lumbar spine with interbody fusion noted. Lower lumbar fusion present. Visualized hardware intact. Anatomic alignment. Scoliosis and diffuse degenerative change. No acute bony abnormality. IMPRESSION: Postsurgical changes lumbar spine. Electronically Signed   By: Marcello Moores   Register   On: 08/12/2020 13:01   DG CHEST PORT 1 VIEW  Result Date: 08/16/2020 CLINICAL DATA:  Short of breath, productive cough EXAM: PORTABLE CHEST 1 VIEW COMPARISON:  10/13/2004 FINDINGS: Hypoventilation with decreased lung volume. Left lower lobe consolidation, moderate. Mild right lower lobe airspace disease. No edema or effusion. IMPRESSION: Bibasilar airspace disease left greater than right. Possible atelectasis versus pneumonia. Electronically Signed   By: Franchot Gallo M.D.   On: 08/16/2020 11:52   DG C-Arm 1-60 Min  Result Date: 08/12/2020 CLINICAL DATA:  Lumbar surgery. EXAM: LUMBAR SPINE - 2-3 VIEW; DG C-ARM 1-60 MIN COMPARISON:  Prior study same day.  MRI 03/04/2020. FINDINGS: Lumbar spine numbered as per prior MRI. Postsurgical changes noted of the lumbosacral spine with L2 through S1 posterior and interbody fusion. L5-S1 anterior fusion. 3 minutes 47 seconds fluoroscopy time. 69.46 mGy. IMPRESSION: Postsurgical changes lumbosacral spine. Electronically Signed   By: Marcello Moores  Register   On: 08/12/2020 16:31   DG OR LOCAL ABDOMEN  Addendum Date: 08/12/2020   ADDENDUM REPORT: 08/12/2020 13:23 ADDENDUM: Surgical clip noted over the right abdomen. No retained surgical instruments noted. Electronically Signed   By: Marcello Moores  Register   On: 08/12/2020 13:23   Result Date: 08/12/2020 CLINICAL DATA:  Evaluation for retained instrument. EXAM: OR LOCAL ABDOMEN COMPARISON:  No recent prior. FINDINGS: Right lateral and upper abdomen not completely imaged. Soft tissue structures are unremarkable. No evidence of retained surgical instrument. Prior lumbosacral spine fusion. Pelvic calcification consistent phleboliths. Degenerative changes and scoliosis lumbar spine. Degenerative changes both hips. IMPRESSION: No retained surgical instrument noted. Report phoned to the operating room at the time of the study. Electronically Signed: ByMarcello Moores  Register On: 08/12/2020 10:48    Assessment/Plan 1. Back pain  with history of spinal surgery She says Prednisone prescribed by Dr Vertell Limber took care of her symptoms  Has stopped Oxycodone Want to try Robaxin PRN for severe Pain Also Continue Neurontin 400 mg at night  2. Hyponatremia Repeat BMP  3. Blood loss anemia Repeat CBC Did not restart Iron due to poor appetite  4. Depression, recurrent ([place]) Will restart lexapro  5. Lymphocytic colitis Has not tolerated Budesonide  6. Recurrent UTI Just treated with Cephalosporin and her symptoms are resolved On Hiprex and Estrogen  Hyperlipidemia, unspecified hyperlipidemia type Did not tolerate Lipitor Will discuss other statins Genital herpes simplex, unspecified site On Vancyclovir Has h/o Soft Tissue swelling in her Left Leg Will consider Ortho if persists  Did not see it today Age-related osteoporosis without current pathological fracture ON Prolia in 11/21  Labs/tests ordered:  * No order type specified * Next appt:  09/20/2020  

## 2020-09-20 ENCOUNTER — Other Ambulatory Visit: Payer: Self-pay

## 2020-09-20 DIAGNOSIS — E871 Hypo-osmolality and hyponatremia: Secondary | ICD-10-CM

## 2020-09-20 DIAGNOSIS — D500 Iron deficiency anemia secondary to blood loss (chronic): Secondary | ICD-10-CM

## 2020-09-21 LAB — COMPLETE METABOLIC PANEL WITH GFR
AG Ratio: 1.7 (calc) (ref 1.0–2.5)
ALT: 12 U/L (ref 6–29)
AST: 17 U/L (ref 10–35)
Albumin: 3.9 g/dL (ref 3.6–5.1)
Alkaline phosphatase (APISO): 86 U/L (ref 37–153)
BUN: 7 mg/dL (ref 7–25)
CO2: 29 mmol/L (ref 20–32)
Calcium: 9 mg/dL (ref 8.6–10.4)
Chloride: 104 mmol/L (ref 98–110)
Creat: 0.66 mg/dL (ref 0.60–0.93)
GFR, Est African American: 99 mL/min/{1.73_m2} (ref >=60)
GFR, Est Non African American: 85 mL/min/{1.73_m2} (ref >=60)
Globulin: 2.3 g/dL (calc) (ref 1.9–3.7)
Glucose, Bld: 101 mg/dL — ABNORMAL HIGH (ref 65–99)
Potassium: 3.8 mmol/L (ref 3.5–5.3)
Sodium: 141 mmol/L (ref 135–146)
Total Bilirubin: 0.4 mg/dL (ref 0.2–1.2)
Total Protein: 6.2 g/dL (ref 6.1–8.1)

## 2020-09-21 LAB — CBC WITH DIFFERENTIAL/PLATELET
Absolute Monocytes: 561 cells/uL (ref 200–950)
Basophils Absolute: 47 cells/uL (ref 0–200)
Basophils Relative: 0.8 %
Eosinophils Absolute: 59 cells/uL (ref 15–500)
Eosinophils Relative: 1 %
HCT: 35.4 % (ref 35.0–45.0)
Hemoglobin: 11.3 g/dL — ABNORMAL LOW (ref 11.7–15.5)
Lymphs Abs: 1210 cells/uL (ref 850–3900)
MCH: 32.5 pg (ref 27.0–33.0)
MCHC: 31.9 g/dL — ABNORMAL LOW (ref 32.0–36.0)
MCV: 101.7 fL — ABNORMAL HIGH (ref 80.0–100.0)
MPV: 9.4 fL (ref 7.5–12.5)
Monocytes Relative: 9.5 %
Neutro Abs: 4024 cells/uL (ref 1500–7800)
Neutrophils Relative %: 68.2 %
Platelets: 378 10*3/uL (ref 140–400)
RBC: 3.48 10*6/uL — ABNORMAL LOW (ref 3.80–5.10)
RDW: 12.2 % (ref 11.0–15.0)
Total Lymphocyte: 20.5 %
WBC: 5.9 10*3/uL (ref 3.8–10.8)

## 2020-09-21 LAB — TSH: TSH: 2.04 mIU/L (ref 0.40–4.50)

## 2020-09-22 ENCOUNTER — Non-Acute Institutional Stay: Payer: Medicare Other | Admitting: Internal Medicine

## 2020-09-22 ENCOUNTER — Other Ambulatory Visit: Payer: Self-pay

## 2020-09-22 ENCOUNTER — Encounter: Payer: Self-pay | Admitting: Internal Medicine

## 2020-09-22 VITALS — BP 128/70 | HR 81 | Temp 97.1°F | Ht 62.0 in | Wt 131.9 lb

## 2020-09-22 DIAGNOSIS — Z9889 Other specified postprocedural states: Secondary | ICD-10-CM

## 2020-09-22 DIAGNOSIS — K52832 Lymphocytic colitis: Secondary | ICD-10-CM

## 2020-09-22 DIAGNOSIS — N390 Urinary tract infection, site not specified: Secondary | ICD-10-CM

## 2020-09-22 DIAGNOSIS — F419 Anxiety disorder, unspecified: Secondary | ICD-10-CM

## 2020-09-22 DIAGNOSIS — E871 Hypo-osmolality and hyponatremia: Secondary | ICD-10-CM

## 2020-09-22 DIAGNOSIS — M810 Age-related osteoporosis without current pathological fracture: Secondary | ICD-10-CM

## 2020-09-22 DIAGNOSIS — M549 Dorsalgia, unspecified: Secondary | ICD-10-CM

## 2020-09-22 DIAGNOSIS — F339 Major depressive disorder, recurrent, unspecified: Secondary | ICD-10-CM

## 2020-09-22 DIAGNOSIS — D500 Iron deficiency anemia secondary to blood loss (chronic): Secondary | ICD-10-CM

## 2020-09-22 DIAGNOSIS — E785 Hyperlipidemia, unspecified: Secondary | ICD-10-CM

## 2020-09-22 DIAGNOSIS — A6000 Herpesviral infection of urogenital system, unspecified: Secondary | ICD-10-CM

## 2020-09-22 MED ORDER — METHOCARBAMOL 500 MG PO TABS: 500.0000 mg | ORAL_TABLET | Freq: Three times a day (TID) | ORAL | 0 refills | Status: DC | PRN

## 2020-09-22 NOTE — Progress Notes (Signed)
Location: [facility] [place] of Service:  Clinic (12)  Provider:   Code Status:  Goals of Care:  Advanced Directives 08/25/2020  Does Patient Have a Medical Advance Directive? Yes  Type of Paramedic of [place];Living will  Does patient want to make changes to medical advance directive? -  Copy of [place] in Chart? No - copy requested  Would patient like information on creating a medical advance directive? -     Chief Complaint  Patient presents with  . Medical Management of Chronic Issues    Patient returns to the clinic for follow up.    HPI: Patient is a 80 y.o. female seen today for an acute visit for Follow up   Patient was admitted in the hospital from 3/3-3/14 underwent decompression and fusion of her lumbar spine.2-3, 3-4, 4-5  Patient has a history ofrecurrent UTI prophylaxis with Hip-Rex History of depression, h/o osteoporosis History of lymphocytic colitis was not able to tolerate budesonide History of anxiety attacks versus TIA Previous work-up with neurology including MRI. EEG has been negative  Was electively admitted for lumbar spine fusion for lumbar scoliosis and stenosis Postop was complicated with acute metabolic encephalopathy and hyponatremia Her hyponatremia was thought to be due to poor p.o. intake. Her trazodone and gabapentin were stopped to see reason for SIADH. She was treated with hypertonic saline   Patient doing very well with her back.  She is wearing the back belt. Walking with her walker. Pain is controlled now. Not taking Oxycodone Off her prednisone. Follow up with Dr Vertell Limber Depression Does not want to take Lexapro. Feels doing well without it Did have some neck pain this morning. Is wearing her Neck collar and want to use Muscle relaxant PRN for now  She is excited to be moving out of [facility]. Wants to follow with Dr Sabra Heck at [facility] office    Past Medical History:   Diagnosis Date  . Barrett's esophagus   . Colitis   . DDD (degenerative disc disease)   . Erroneous encounter - disregard    error   . Erroneous encounter - disregard   . Erroneous encounter - disregard   . Factor 5 Leiden mutation, heterozygous [facility])    Per [place] Patient Packet.  . Frequent UTI   . H/O echocardiogram 04/01/2019   Per [place] Patient Packet.  . Hiatal hernia    Per [place] Patient Packet.  . High grade dysplasia of Barrett's epithelium    Per [facility] New Patient Packet.  Marland Kitchen History of bladder infections    Per [facility] New Patient Packet.  Marland Kitchen History of bone density study 07/12/2018   Per [place] Patient Packet.  Marland Kitchen History of bone density study 07/12/2018   Up coming appointment 07/17/2019, Per [facility] New Patient Packet  . History of bone density study 07/17/2019  . History of endoscopy 12/17/2015   Needed Every 3 years. Scheduled for January 2021. Per [facility] New Patient Packet.   Marland Kitchen History of mammogram 04/14/2019   Per [place] Patient Packet.  Marland Kitchen History of MRI 03/01/2019   By Dr.Gulati/ Neurologist. MRI of Brain. Per [facility] New Patient Packet.  Marland Kitchen History of MRI 04/23/2019   By Dr. Maryan Rued at [facility]. Per [facility] New Patient Packet.  Marland Kitchen History of Papanicolaou smear of cervix 10/16/2011   Per [place] Patient Packet  . Hypotension    Per [facility] New Patient Packet.  Marland Kitchen  IBS (irritable bowel syndrome)    Per [place] New Patient Packet.  . Lactose intolerance   . Osteoarthritis    Per [facility] New Patient Packet.  Marland Kitchen Osteopenia    Per [place] New Patient Packet.  . Osteoporosis   . Premature ventricular complex   . Raynaud's disease    Per [facility] New Patient Packet.  . Scoliosis    Per [place] New Patient Packet.    Past Surgical History:  Procedure Laterality Date  . ABDOMINAL EXPOSURE N/A 08/12/2020   Procedure: ABDOMINAL EXPOSURE;  Surgeon: Serafina Mitchell, MD;  Location: [place];  Service: Vascular;  Laterality: N/A;  . ABDOMINAL HYSTERECTOMY  06/13/2011   By Dr[facility]Scherer at  [facility]. Per [facility] New Patient Packet.  . ANTERIOR LAT LUMBAR FUSION Right 08/12/2020   Procedure: Right Lumbar two-three, Lumbar three-four, Lumbar four-five Anterolateral lumbar interbody fusion;  Surgeon: Erline Levine, MD;  Location: [place];  Service: Neurosurgery;  Laterality: Right;  . ANTERIOR LUMBAR FUSION N/A 08/12/2020   Procedure: Lumbar five Sacral one Anterior lumbar interbody fusion;  Surgeon: Erline Levine, MD;  Location: [place];  Service: Neurosurgery;  Laterality: N/A;  . BREAST EXCISIONAL BIOPSY Left 30+ yrs ago  . COLONOSCOPY  11/23/2011   By Dr.McCune at [facility]. Per [place] Patient Packet.  . COLONOSCOPY  08/26/2019  . LUMBAR PERCUTANEOUS PEDICLE SCREW 4 LEVEL N/A 08/12/2020   Procedure: Percutaneous pedicle screw fixation from Lumbar two to Sacral one ;  Surgeon: Erline Levine, MD;  Location: [place];  Service: Neurosurgery;  Laterality: N/A;  . MAMMOGRAM  04/14/2019  . pap smear  10/16/2011  . SIGMOIDOSCOPY  11/02/2014   Per [place] New Patient Packet  . TONSILLECTOMY  06/12/1946   Per [place] New Patient Packet.    Allergies  Allergen Reactions  . Banana Swelling    Swelling around mouth and eyes and red blotches  . Latex Swelling, Rash and Other (See Comments)  . Ciprofloxacin Hcl Other (See Comments)    Severe knee inflammation   . Gatifloxacin Other (See Comments)    Severe knee inflammation   . Levofloxacin Other (See Comments)    Severe knee inflammation   . Moxifloxacin Other (See Comments)    Severe knee inflammation   . Norfloxacin Other (See Comments)    Severe knee inflammation   . Nsaids Other (See Comments)    Upsets ulcers  . Ofloxacin Other (See Comments)    Severe knee inflammation  . Other Other (See Comments)    FLOXIN  . Penicillins Hives    Tolerates (KEFLEX-cephalexin)   . Methylisothiazolinone Rash and Other (See Comments)    A preservative found in a cream pt used  . Tape Rash    Outpatient Encounter  Medications as of 09/22/2020  Medication Sig  . aspirin EC 81 MG tablet Take 81 mg by mouth daily with breakfast.  . B Complex-C (B-COMPLEX WITH VITAMIN C) tablet Take 1 tablet by mouth daily.  . Biotin 5000 MCG TABS Take 5,000 mcg by mouth daily.  Marland Kitchen CALCIUM PO Take 1,200 mg by mouth daily.   . Cholecalciferol (VITAMIN D3) 50 MCG (2000 UT) TABS Take 2,000 Units by mouth in the morning, at noon, and at bedtime.  . conjugated estrogens (PREMARIN) vaginal cream Place 1 Applicatorful vaginally 3 (three) times a week.  . gabapentin (NEURONTIN) 100 MG capsule Take 100 mg by mouth 3 (three) times daily.  . Glucosamine HCl 1500 MG TABS Take 1,500 mg by mouth  daily.  . methenamine (HIPREX) 1 g tablet Take 1 g by mouth 2 (two) times daily with a meal. For UTI Prevention. Last UTI: October 20th, November 5th, and December 28th.  . Methenamine-Sodium Salicylate (CYSTEX PO) Take 100 mg by mouth daily. ( Cranberry Concentrate )  . Multiple Vitamin (MULTIVITAMIN WITH MINERALS) TABS tablet Take 1 tablet by mouth daily. One A Day for Women  . NON FORMULARY CBD Full spectrum 1 ml as needed for sleep.  Marland Kitchen Omeprazole-Sodium Bicarbonate (ZEGERID PO) Take 20 mg by mouth 2 (two) times daily.  Vladimir Faster Glycol-Propyl Glycol (SYSTANE) 0.4-0.3 % SOLN Place 1 drop into both eyes as needed (dry/irritated eyes).  . Probiotic Product (PROBIOTIC COLON SUPPORT PO) Take 1 tablet by mouth in the morning and at bedtime.  . valACYclovir (VALTREX) 500 MG tablet TAKE (1) TABLET DAILY AS NEEDED.  Marland Kitchen Loperamide-Simethicone 2-125 MG TABS Take 1 tablet by mouth 4 (four) times daily as needed (diarrhea/loose stools).  . methocarbamol (ROBAXIN) 500 MG tablet Take 1 tablet (500 mg total) by mouth every 8 (eight) hours as needed for muscle spasms. Use Half Tablet and if that does not help can take full  . [DISCONTINUED] escitalopram (LEXAPRO) 5 MG tablet Take 1 tablet (5 mg total) by mouth daily.  . [DISCONTINUED] methocarbamol (ROBAXIN) 500  MG tablet Take 1 tablet (500 mg total) by mouth every 8 (eight) hours as needed for muscle spasms. Use Half Tablet and if that does not help can take full  . [DISCONTINUED] oxyCODONE-acetaminophen (PERCOCET/ROXICET) 5-325 MG tablet Take 1-2 tablets by mouth every 8 (eight) hours as needed for severe pain. (Patient not taking: No sig reported)   No facility-administered encounter medications on file as of 09/22/2020.    Review of Systems:  Review of Systems  Constitutional: Negative.   HENT: Negative.   Respiratory: Negative.   Cardiovascular: Negative.   Gastrointestinal: Negative.   Genitourinary: Negative.   Musculoskeletal: Positive for gait problem and neck pain.  Skin: Negative.   Neurological: Positive for weakness.  Psychiatric/Behavioral: Negative.     Health Maintenance  Topic Date Due  . Hepatitis C Screening  Never done  . PNA vac Low Risk Adult (2 of 2 - PPSV23) 12/12/2015  . TETANUS/TDAP  04/08/2018  . COVID-19 Vaccine (3 - Booster for Moderna series) 01/11/2020  . INFLUENZA VACCINE  01/10/2021  . DEXA SCAN  Completed  . HPV VACCINES  Aged Out    Physical Exam: Vitals:   09/22/20 1413  BP: 128/70  Pulse: 81  Temp: (!) 97.1 F (36.2 C)  SpO2: 100%  Weight: 131 lb 14.4 oz (59.8 kg)  Height: 5\' 2"  (1.575 m)   Body mass index is 24.12 kg/m. Physical Exam  Constitutional: Oriented to person, place, and time. Well-developed and well-nourished.  HENT:  Head: Normocephalic.  Mouth/Throat: Oropharynx is clear and moist.  Eyes: Pupils are equal, round, and reactive to light.  Neck: Neck supple.  Cardiovascular: Normal rate and normal heart sounds.  No murmur heard. Pulmonary/Chest: Effort normal and breath sounds normal. No respiratory distress. No wheezes. She has no rales.  Abdominal: Soft. Bowel sounds are normal. No distension. There is no tenderness. There is no rebound.  Musculoskeletal: No edema.  Lymphadenopathy: none Neurological: Alert and oriented  to person, place, and time. Does have neck collar. Walking very well with her walker Skin: Skin is warm and dry.  Psychiatric: Normal mood and affect. Behavior is normal. Thought content normal.   Labs reviewed: Basic Metabolic  Panel: Recent Labs    11/19/19 0929 08/09/20 1530 08/17/20 1543 08/17/20 1804 08/21/20 0416 08/21/20 1259 08/22/20 0415 08/23/20 0158 08/31/20 0346 09/20/20 0815  NA 142   < > 115*   < > 131*   < > 135 136 138 141  K 3.9   < > 4.7   < > 4.3  --  4.2 4.2 3.8 3.8  CL 103   < > 83*   < > 94*  --  98 101 103 104  CO2 29   < > 25   < > 29  --  30 27 28 29   GLUCOSE 82   < > 103*   < > 180*  --  89 110* 109* 101*  BUN 11   < > 8   < > 20  --  15 12 11 7   CREATININE 0.81   < > 0.46   < > 0.62  --  0.68 0.64 0.59 0.66  CALCIUM 9.5   < > 7.5*   < > 8.7*  --  8.3* 8.7* 8.8* 9.0  MG 2.1  --   --    < > 1.7  --  1.7 1.9  --   --   TSH 2.44  --  1.837  --   --   --   --   --   --  2.04   < > = values in this interval not displayed.   Liver Function Tests: Recent Labs    11/19/19 0929 08/31/20 0346 09/20/20 0815  AST 20 24 17   ALT 15 19 12   ALKPHOS  --  96  --   BILITOT 0.4 0.6 0.4  PROT 5.9* 5.9* 6.2  ALBUMIN  --  3.4*  --    No results for input(s): LIPASE, AMYLASE in the last 8760 hours. No results for input(s): AMMONIA in the last 8760 hours. CBC: Recent Labs    11/19/19 0929 08/09/20 1530 08/23/20 0158 08/31/20 0346 09/20/20 0815  WBC 6.2   < > 19.5* 8.4 5.9  NEUTROABS 3,422  --   --  5.7 4,024  HGB 12.9   < > 8.6* 9.2* 11.3*  HCT 38.7   < > 26.2* 28.1* 35.4  MCV 99.2   < > 100.8* 102.6* 101.7*  PLT 270   < > 489* 498* 378   < > = values in this interval not displayed.   Lipid Panel: No results for input(s): CHOL, HDL, LDLCALC, TRIG, CHOLHDL, LDLDIRECT in the last 8760 hours. No results found for: HGBA1C  Procedures since last visit: DG Lumbar Spine 2-3 Views  Result Date: 08/31/2020 CLINICAL DATA:  Twisted back. EXAM: LUMBAR SPINE -  2-3 VIEW COMPARISON:  08/12/2020.  MRI lumbar spine 08/05/2019. FINDINGS: Lumbar spine numbered as per prior MRI. L2 through S1 posterior and interbody fusion. Hardware intact. Diffuse degenerative change. No acute bony abnormality identified. IMPRESSION: L2 through S1 posterior interbody fusion. Hardware intact. Diffuse degenerative change. No acute abnormality identified. Electronically Signed   By: Marcello Moores  Register   On: 08/31/2020 12:44    Assessment/Plan Back pain with history of spinal surgery Doing really well Off Oxycodone. Can use robaxin PRN Follow up with Dr Vertell Limber  Hyponatremia Sodium in Normal range now  Blood loss anemia Continue to follow as outpatient  Depression, recurrent ([place]) Discontinue Lexapro Doing well without it  Lymphocytic colitis Had some diarrhea recently  Is going to call GI for follow up Has not tolerated Budesonide  Recurrent UTI  On Hiprex and Cystex   Age-related osteoporosis without current pathological fracture Gets Prolia from our office 11/21 Anxiety Doing well doe snot need any other meds Hyperlipidemia, unspecified hyperlipidemia type Did not tolerate Lipitor Will discuss other statins Genital herpes simplex, unspecified site On Vancyclovir  Has h/o Soft Tissue swelling in her Left Leg Will consider Ortho if persists  Did not see it today  Follow  in [place]  Labs/tests ordered:  * No order type specified * Next appt:  11/09/2020

## 2020-09-28 ENCOUNTER — Encounter (HOSPITAL_COMMUNITY): Payer: Self-pay | Admitting: Neurosurgery

## 2020-10-19 ENCOUNTER — Other Ambulatory Visit: Payer: Self-pay | Admitting: Neurology

## 2020-11-09 ENCOUNTER — Other Ambulatory Visit: Payer: Self-pay

## 2020-11-09 ENCOUNTER — Ambulatory Visit: Payer: Medicare Other | Admitting: *Deleted

## 2020-11-09 DIAGNOSIS — M810 Age-related osteoporosis without current pathological fracture: Secondary | ICD-10-CM | POA: Diagnosis not present

## 2020-11-09 MED ORDER — DENOSUMAB 60 MG/ML ~~LOC~~ SOSY
60.0000 mg | PREFILLED_SYRINGE | Freq: Once | SUBCUTANEOUS | Status: AC
  Administered 2020-11-09: 60 mg via SUBCUTANEOUS

## 2020-12-22 ENCOUNTER — Encounter: Payer: Self-pay | Admitting: Family Medicine

## 2020-12-22 ENCOUNTER — Ambulatory Visit (INDEPENDENT_AMBULATORY_CARE_PROVIDER_SITE_OTHER): Payer: Medicare Other | Admitting: Family Medicine

## 2020-12-22 ENCOUNTER — Other Ambulatory Visit: Payer: Self-pay

## 2020-12-22 VITALS — BP 128/72 | HR 74 | Temp 97.7°F | Ht 62.0 in | Wt 135.4 lb

## 2020-12-22 DIAGNOSIS — E871 Hypo-osmolality and hyponatremia: Secondary | ICD-10-CM

## 2020-12-22 DIAGNOSIS — D500 Iron deficiency anemia secondary to blood loss (chronic): Secondary | ICD-10-CM | POA: Diagnosis not present

## 2020-12-22 NOTE — Progress Notes (Signed)
Provider:  Alain Honey, MD  Careteam: Patient Care Team: Virgie Dad, MD as PCP - General (Internal Medicine) Rozetta Nunnery, MD as Consulting Physician (Otolaryngology) Luberta Mutter, MD as Consulting Physician (Ophthalmology)  PLACE OF SERVICE:  [place]  Advanced Directive information    Allergies  Allergen Reactions   Banana Swelling    Swelling around mouth and eyes and red blotches   Latex Swelling, Rash and Other (See Comments)   Ciprofloxacin Hcl Other (See Comments)    Severe knee inflammation    Gatifloxacin Other (See Comments)    Severe knee inflammation    Levofloxacin Other (See Comments)    Severe knee inflammation    Moxifloxacin Other (See Comments)    Severe knee inflammation    Norfloxacin Other (See Comments)    Severe knee inflammation    Nsaids Other (See Comments)    Upsets ulcers   Ofloxacin Other (See Comments)    Severe knee inflammation   Other Other (See Comments)    FLOXIN   Penicillins Hives    Tolerates (KEFLEX-cephalexin)    Methylisothiazolinone Rash and Other (See Comments)    A preservative found in a cream pt used   Tape Rash    Chief Complaint  Patient presents with   Medical Management of Chronic Issues    Patient presents today for a 3 month follow-up.     HPI: Patient is a 80 y.o. female patient had moved to friend's home prior to her back surgery.  She had a few days of physical therapy there and decided she did not need to be at friend's home and moved back to condo.  She is doing well after the back surgery which involved some fusions.  Her only medication is gabapentin 100 mg at night that she takes as much for sleep as for any other symptoms although she does have some neuropathy probably related to her back disease. She has occasional episodes of hypoglycemia but has some insight as to cause since it is largely reactive. She was seen after back surgery at [facility].  When she had  surgery she developed pneumonia and hyponatremia with metabolic encephalopathy.  At her follow-up visit all these issues had resolved with treatment. She has no other new symptoms or complaints today  Review of Systems:  Review of Systems  All other systems reviewed and are negative.  Past Medical History:  Diagnosis Date   Barrett's esophagus    Colitis    DDD (degenerative disc disease)    Erroneous encounter - disregard    error    Erroneous encounter - disregard    Erroneous encounter - disregard    Factor 5 Leiden mutation, heterozygous [facility])    Per [place] Patient Packet.   Frequent UTI    H/O echocardiogram 04/01/2019   Per [place] Patient Packet.   Hiatal hernia    Per [place] New Patient Packet.   High grade dysplasia of Barrett's epithelium    Per [facility] New Patient Packet.   History of bladder infections    Per [facility] New Patient Packet.   History of bone density study 07/12/2018   Per [place] Patient Packet.   History of bone density study 07/12/2018   Up coming appointment 07/17/2019, Per [facility] New Patient Packet   History of bone density study 07/17/2019   History of endoscopy 12/17/2015   Needed Every 3 years. Scheduled for January 2021. Per [facility] New Patient Packet.  History of mammogram 04/14/2019   Per [place] Patient Packet.   History of MRI 03/01/2019   By Dr.Gulati/ Neurologist. MRI of Brain. Per [facility] New Patient Packet.   History of MRI 04/23/2019   By Dr. Maryan Rued at [facility]. Per [facility] New Patient Packet.   History of Papanicolaou smear of cervix 10/16/2011   Per [place] New Patient Packet   Hypotension    Per [facility] New Patient Packet.   IBS (irritable bowel syndrome)    Per [place] New Patient Packet.   Lactose intolerance    Osteoarthritis    Per [place] New Patient Packet.   Osteopenia    Per [place] New Patient Packet.   Osteoporosis    Premature ventricular complex    Raynaud's disease    Per [place] Patient Packet.   Scoliosis    Per [facility] New Patient  Packet.   Past Surgical History:  Procedure Laterality Date   ABDOMINAL EXPOSURE N/A 08/12/2020   Procedure: ABDOMINAL EXPOSURE;  Surgeon: Serafina Mitchell, MD;  Location: [facility] OR;  Service: Vascular;  Laterality: N/A;   ABDOMINAL HYSTERECTOMY  06/13/2011   By Dr.Scherer at [facility]. Per [facility] New Patient Packet.   ANTERIOR LAT LUMBAR FUSION Right 08/12/2020   Procedure: Right Lumbar two-three, Lumbar three-four, Lumbar four-five Anterolateral lumbar interbody fusion;  Surgeon: Erline Levine, MD;  Location: [place];  Service: Neurosurgery;  Laterality: Right;   ANTERIOR LUMBAR FUSION N/A 08/12/2020   Procedure: Lumbar five Sacral one Anterior lumbar interbody fusion;  Surgeon: Erline Levine, MD;  Location: [place];  Service: Neurosurgery;  Laterality: N/A;   BREAST EXCISIONAL BIOPSY Left 30+ yrs ago   COLONOSCOPY  11/23/2011   By Dr.McCune at [facility]. Per [place] Patient Packet.   COLONOSCOPY  08/26/2019   LUMBAR PERCUTANEOUS PEDICLE SCREW 4 LEVEL N/A 08/12/2020   Procedure: Percutaneous pedicle screw fixation from Lumbar two to Sacral one ;  Surgeon: Erline Levine, MD;  Location: [place];  Service: Neurosurgery;  Laterality: N/A;   MAMMOGRAM  04/14/2019   pap smear  10/16/2011   SIGMOIDOSCOPY  11/02/2014   Per [place] New Patient Packet   TONSILLECTOMY  06/12/1946   Per [facility] New Patient Packet.   Social History:   reports that she has quit smoking. Her smoking use included cigarettes. She started smoking about 43 years ago. She has never used smokeless tobacco. She reports previous alcohol use of about 7.0 standard drinks of alcohol per week. She reports that she does not use drugs.  Family History  Problem Relation Age of Onset   Cancer Mother    Cancer Father     Medications: Patient's Medications  New Prescriptions   No medications on file  Previous Medications   ASPIRIN EC 81 MG TABLET    Take 81 mg by mouth daily with breakfast.   B COMPLEX-C (B-COMPLEX WITH VITAMIN  C) TABLET    Take 1 tablet by mouth daily.   BIOTIN 5000 MCG TABS    Take 5,000 mcg by mouth daily.   CALCIUM PO    Take 1,200 mg by mouth daily.    CHOLECALCIFEROL (VITAMIN D3) 50 MCG (2000 UT) TABS    Take 2,000 Units by mouth in the morning, at noon, and at bedtime.   CONJUGATED ESTROGENS (PREMARIN) VAGINAL CREAM    Place 1 Applicatorful vaginally 3 (three) times a week.   GABAPENTIN (NEURONTIN) 100 MG CAPSULE    Take 1 capsule (100 mg total) by mouth  3 (three) times daily. Patient has not been seen in over a year. Needs follow up with [facility]([phone]) or request further refills from primary care.   GLUCOSAMINE HCL 1500 MG TABS    Take 1,500 mg by mouth daily.   LOPERAMIDE-SIMETHICONE 2-125 MG TABS    Take 1 tablet by mouth 4 (four) times daily as needed (diarrhea/loose stools).   METHENAMINE (HIPREX) 1 G TABLET    Take 1 g by mouth 2 (two) times daily with a meal. For UTI Prevention. Last UTI: October 20th, November 5th, and December 28th.   METHENAMINE-SODIUM SALICYLATE (CYSTEX PO)    Take 100 mg by mouth daily. ( Cranberry Concentrate )   MULTIPLE VITAMIN (MULTIVITAMIN WITH MINERALS) TABS TABLET    Take 1 tablet by mouth daily. One A Day for Women   POLYETHYL GLYCOL-PROPYL GLYCOL (SYSTANE) 0.4-0.3 % SOLN    Place 1 drop into both eyes as needed (dry/irritated eyes).   PROBIOTIC PRODUCT (PROBIOTIC COLON SUPPORT PO)    Take 1 tablet by mouth in the morning and at bedtime.   VALACYCLOVIR (VALTREX) 500 MG TABLET    TAKE (1) TABLET DAILY AS NEEDED.  Modified Medications   No medications on file  Discontinued Medications   METHOCARBAMOL (ROBAXIN) 500 MG TABLET    Take 1 tablet (500 mg total) by mouth every 8 (eight) hours as needed for muscle spasms. Use Half Tablet and if that does not help can take full   NON FORMULARY    CBD Full spectrum 1 ml as needed for sleep.   OMEPRAZOLE-SODIUM BICARBONATE (ZEGERID PO)    Take 20 mg by mouth 2 (two) times daily.    Physical  Exam:  Vitals:   12/22/20 0931  BP: 128/72  Pulse: 74  Temp: 97.7 F (36.5 C)  TempSrc: Temporal  SpO2: 96%  Weight: 135 lb 6.4 oz (61.4 kg)  Height: 5\' 2"  (1.575 m)   Body mass index is 24.76 kg/m. Wt Readings from Last 3 Encounters:  12/22/20 135 lb 6.4 oz (61.4 kg)  09/22/20 131 lb 14.4 oz (59.8 kg)  09/08/20 127 lb 1.6 oz (57.7 kg)    Physical Exam Vitals and nursing note reviewed.  Constitutional:      Appearance: Normal appearance.  HENT:     Head: Normocephalic.  Cardiovascular:     Rate and Rhythm: Normal rate and regular rhythm.  Pulmonary:     Effort: Pulmonary effort is normal.     Breath sounds: Normal breath sounds.  Abdominal:     General: Abdomen is flat. Bowel sounds are normal.     Palpations: Abdomen is soft.  Neurological:     General: No focal deficit present.     Mental Status: She is alert and oriented to person, place, and time.     Motor: No weakness.     Gait: Gait normal.     Deep Tendon Reflexes: Reflexes normal.  Psychiatric:        Mood and Affect: Mood normal.        Behavior: Behavior normal.    Labs reviewed: Basic Metabolic Panel: Recent Labs    08/17/20 1543 08/17/20 1804 08/21/20 0416 08/21/20 1259 08/22/20 0415 08/23/20 0158 08/31/20 0346 09/20/20 0815  NA 115*   < > 131*   < > 135 136 138 141  K 4.7   < > 4.3  --  4.2 4.2 3.8 3.8  CL 83*   < > 94*  --  98 101 103  104  CO2 25   < > 29  --  30 27 28 29   GLUCOSE 103*   < > 180*  --  89 110* 109* 101*  BUN 8   < > 20  --  15 12 11 7   CREATININE 0.46   < > 0.62  --  0.68 0.64 0.59 0.66  CALCIUM 7.5*   < > 8.7*  --  8.3* 8.7* 8.8* 9.0  MG  --    < > 1.7  --  1.7 1.9  --   --   TSH 1.837  --   --   --   --   --   --  2.04   < > = values in this interval not displayed.   Liver Function Tests: Recent Labs    08/31/20 0346 09/20/20 0815  AST 24 17  ALT 19 12  ALKPHOS 96  --   BILITOT 0.6 0.4  PROT 5.9* 6.2  ALBUMIN 3.4*  --    No results for input(s): LIPASE,  AMYLASE in the last 8760 hours. No results for input(s): AMMONIA in the last 8760 hours. CBC: Recent Labs    08/23/20 0158 08/31/20 0346 09/20/20 0815  WBC 19.5* 8.4 5.9  NEUTROABS  --  5.7 4,024  HGB 8.6* 9.2* 11.3*  HCT 26.2* 28.1* 35.4  MCV 100.8* 102.6* 101.7*  PLT 489* 498* 378   Lipid Panel: No results for input(s): CHOL, HDL, LDLCALC, TRIG, CHOLHDL, LDLDIRECT in the last 8760 hours. TSH: Recent Labs    08/17/20 1543 09/20/20 0815  TSH 1.837 2.04   A1C: No results found for: HGBA1C   Assessment/Plan  1. Hyponatremia Hyponatremia has resolved she is asymptomatic  2. Blood loss anemia Hemoglobin is likewise resolved.  Hemoglobin had dropped to 8.6 during her hospitalization but when last checked in April was 11.3.  She denies symptoms that would suggest anemia   Alain Honey, MD [facility] [phone]

## 2021-01-11 DIAGNOSIS — H61001 Unspecified perichondritis of right external ear: Secondary | ICD-10-CM | POA: Diagnosis not present

## 2021-01-11 DIAGNOSIS — L720 Epidermal cyst: Secondary | ICD-10-CM | POA: Diagnosis not present

## 2021-01-11 DIAGNOSIS — L821 Other seborrheic keratosis: Secondary | ICD-10-CM | POA: Diagnosis not present

## 2021-01-11 DIAGNOSIS — D1724 Benign lipomatous neoplasm of skin and subcutaneous tissue of left leg: Secondary | ICD-10-CM | POA: Diagnosis not present

## 2021-01-11 DIAGNOSIS — L570 Actinic keratosis: Secondary | ICD-10-CM | POA: Diagnosis not present

## 2021-01-20 ENCOUNTER — Other Ambulatory Visit: Payer: Self-pay | Admitting: Neurology

## 2021-01-20 ENCOUNTER — Other Ambulatory Visit: Payer: Self-pay | Admitting: Internal Medicine

## 2021-02-07 ENCOUNTER — Other Ambulatory Visit: Payer: Self-pay | Admitting: Family Medicine

## 2021-02-07 DIAGNOSIS — Z1231 Encounter for screening mammogram for malignant neoplasm of breast: Secondary | ICD-10-CM

## 2021-02-08 ENCOUNTER — Ambulatory Visit
Admission: RE | Admit: 2021-02-08 | Discharge: 2021-02-08 | Disposition: A | Discharge status: Home / Self Care | Payer: Medicare Other | Source: Ambulatory Visit | Attending: Family Medicine | Admitting: Family Medicine

## 2021-02-08 ENCOUNTER — Encounter: Payer: Self-pay | Admitting: Family Medicine

## 2021-02-08 ENCOUNTER — Ambulatory Visit (INDEPENDENT_AMBULATORY_CARE_PROVIDER_SITE_OTHER): Payer: Medicare Other | Admitting: Family Medicine

## 2021-02-08 ENCOUNTER — Other Ambulatory Visit: Payer: Self-pay

## 2021-02-08 VITALS — BP 140/80 | HR 84 | Temp 98.0°F | Resp 16 | Ht 62.0 in | Wt 135.2 lb

## 2021-02-08 DIAGNOSIS — M79672 Pain in left foot: Secondary | ICD-10-CM | POA: Diagnosis not present

## 2021-02-08 DIAGNOSIS — K22719 Barrett's esophagus with dysplasia, unspecified: Secondary | ICD-10-CM | POA: Diagnosis not present

## 2021-02-08 MED ORDER — PANTOPRAZOLE SODIUM 40 MG PO TBEC: 40.0000 mg | DELAYED_RELEASE_TABLET | Freq: Every day | ORAL | 3 refills | Status: DC

## 2021-02-08 NOTE — Patient Instructions (Signed)
Take protonix twice a day for 2 weeks Watch foods with acid

## 2021-02-08 NOTE — Progress Notes (Signed)
Provider:  Alain Honey, MD  Careteam: Patient Care Team: Wardell Honour, MD as PCP - General (Family Medicine) Rozetta Nunnery, MD as Consulting Physician (Otolaryngology) Luberta Mutter, MD as Consulting Physician (Ophthalmology)  PLACE OF SERVICE:  [place] Directive information Does Patient Have a Medical Advance Directive?: No, Would patient like information on creating a medical advance directive?: No - Patient declined  Allergies  Allergen Reactions   Banana Swelling    Swelling around mouth and eyes and red blotches   Latex Swelling, Rash and Other (See Comments)   Ciprofloxacin Hcl Other (See Comments)    Severe knee inflammation    Gatifloxacin Other (See Comments)    Severe knee inflammation    Levofloxacin Other (See Comments)    Severe knee inflammation    Moxifloxacin Other (See Comments)    Severe knee inflammation    Norfloxacin Other (See Comments)    Severe knee inflammation    Nsaids Other (See Comments)    Upsets ulcers   Ofloxacin Other (See Comments)    Severe knee inflammation   Other Other (See Comments)    FLOXIN   Penicillins Hives    Tolerates (KEFLEX-cephalexin)    Methylisothiazolinone Rash and Other (See Comments)    A preservative found in a cream pt used   Tape Rash    Chief Complaint  Patient presents with   Acute Visit    Patient complains of stomach ulcer pain/diarrhea.     HPI: Patient is a 80 y.o. female .  Carol Perez has several complaints today.  First she complains of stomach pain.  She has a past history of ulcer disease.  She has been taking Zegerid recently.  She endorses eating more tomatoes out of the garden recently.  She drinks almond milk for symptomatic relief.  Also has had more frequent diarrhea and uses Imodium.  There is some nighttime wakening with pain Next complaint is a lump on her left upper leg below the knee.  She is concerned because her mother had a histiocytoma in her  right upper thigh and she is concerned it could be some malignancy.  She also has some pain in her left foot.  She has been walking more recently as therapy for her back after having had surgery.  Past history of stress fracture and she thinks this feels similar.  Review of Systems:  Review of Systems  Constitutional: Negative.   HENT: Negative.    Eyes: Negative.   Respiratory: Negative.    Cardiovascular: Negative.   Gastrointestinal:  Positive for abdominal pain and diarrhea.  Neurological: Negative.   Psychiatric/Behavioral: Negative.    All other systems reviewed and are negative.  Past Medical History:  Diagnosis Date   Barrett's esophagus    Colitis    DDD (degenerative disc disease)    Erroneous encounter - disregard    error    Erroneous encounter - disregard    Erroneous encounter - disregard    Factor 5 Leiden mutation, heterozygous [facility])    Per [place] Patient Packet.   Frequent UTI    H/O echocardiogram 04/01/2019   Per [place] Patient Packet.   Hiatal hernia    Per [place] New Patient Packet.   High grade dysplasia of Barrett's epithelium    Per [facility] New Patient Packet.   History of bladder infections    Per [facility] New Patient Packet.   History of bone density study 07/12/2018   Per [facility] New Patient  Packet.   History of bone density study 07/12/2018   Up coming appointment 07/17/2019, Per [facility] New Patient Packet   History of bone density study 07/17/2019   History of endoscopy 12/17/2015   Needed Every 3 years. Scheduled for January 2021. Per [facility] New Patient Packet.    History of mammogram 04/14/2019   Per [place] Patient Packet.   History of MRI 03/01/2019   By Dr.Gulati/ Neurologist. MRI of Brain. Per [facility] New Patient Packet.   History of MRI 04/23/2019   By Dr. Maryan Rued at [facility]. Per [facility] New Patient Packet.   History of Papanicolaou smear of cervix 10/16/2011   Per [place] New Patient Packet   Hypotension    Per [facility] New Patient Packet.   IBS  (irritable bowel syndrome)    Per [place] New Patient Packet.   Lactose intolerance    Osteoarthritis    Per [place] New Patient Packet.   Osteopenia    Per [place] New Patient Packet.   Osteoporosis    Premature ventricular complex    Raynaud's disease    Per [place] Patient Packet.   Scoliosis    Per [facility] New Patient Packet.   Past Surgical History:  Procedure Laterality Date   ABDOMINAL EXPOSURE N/A 08/12/2020   Procedure: ABDOMINAL EXPOSURE;  Surgeon: Serafina Mitchell, MD;  Location: [facility] OR;  Service: Vascular;  Laterality: N/A;   ABDOMINAL HYSTERECTOMY  06/13/2011   By Dr[facility]Scherer at [facility]. Per [facility] New Patient Packet.   ANTERIOR LAT LUMBAR FUSION Right 08/12/2020   Procedure: Right Lumbar two-three, Lumbar three-four, Lumbar four-five Anterolateral lumbar interbody fusion;  Surgeon: Erline Levine, MD;  Location: [place];  Service: Neurosurgery;  Laterality: Right;   ANTERIOR LUMBAR FUSION N/A 08/12/2020   Procedure: Lumbar five Sacral one Anterior lumbar interbody fusion;  Surgeon: Erline Levine, MD;  Location: [place];  Service: Neurosurgery;  Laterality: N/A;   BREAST EXCISIONAL BIOPSY Left 30+ yrs ago   COLONOSCOPY  11/23/2011   By Dr.McCune at [place] [facility]. Per [place] Patient Packet.   COLONOSCOPY  08/26/2019   LUMBAR PERCUTANEOUS PEDICLE SCREW 4 LEVEL N/A 08/12/2020   Procedure: Percutaneous pedicle screw fixation from Lumbar two to Sacral one ;  Surgeon: Erline Levine, MD;  Location: [place];  Service: Neurosurgery;  Laterality: N/A;   MAMMOGRAM  04/14/2019   pap smear  10/16/2011   SIGMOIDOSCOPY  11/02/2014   Per [place] New Patient Packet   TONSILLECTOMY  06/12/1946   Per [facility] New Patient Packet.   Social History:   reports that she has quit smoking. Her smoking use included cigarettes. She started smoking about 43 years ago. She has never used smokeless tobacco. She reports that she does not currently use alcohol after a past usage of about 7.0 standard drinks per week.  She reports that she does not use drugs.  Family History  Problem Relation Age of Onset   Cancer Mother    Cancer Father     Medications: Patient's Medications  New Prescriptions   No medications on file  Previous Medications   ASPIRIN EC 81 MG TABLET    Take 81 mg by mouth daily with breakfast.   B COMPLEX-C (B-COMPLEX WITH VITAMIN C) TABLET    Take 1 tablet by mouth daily.   BIOTIN 5000 MCG TABS    Take 5,000 mcg by mouth daily.   CALCIUM PO    Take 1,200 mg by mouth daily.    CHOLECALCIFEROL (VITAMIN D3)  50 MCG (2000 UT) TABS    Take 2,000 Units by mouth in the morning, at noon, and at bedtime.   CONJUGATED ESTROGENS (PREMARIN) VAGINAL CREAM    Place 1 Applicatorful vaginally 3 (three) times a week.   GABAPENTIN (NEURONTIN) 100 MG CAPSULE    TAKE ONE CAPSULE BY MOUTH THREE TIMES DAILY PATIENT TO FOLLOW-UP WITH DR FOR FUTHER REFILLS   GLUCOSAMINE HCL 1500 MG TABS    Take 1,500 mg by mouth daily.   LOPERAMIDE-SIMETHICONE 2-125 MG TABS    Take 1 tablet by mouth 4 (four) times daily as needed (diarrhea/loose stools).   METHENAMINE (HIPREX) 1 G TABLET    Take 1 g by mouth 2 (two) times daily with a meal. For UTI Prevention. Last UTI: October 20th, November 5th, and December 28th.   METHENAMINE-SODIUM SALICYLATE (CYSTEX PO)    Take 100 mg by mouth daily. ( Cranberry Concentrate )   MULTIPLE VITAMIN (MULTIVITAMIN WITH MINERALS) TABS TABLET    Take 1 tablet by mouth daily. One A Day for Women   POLYETHYL GLYCOL-PROPYL GLYCOL (SYSTANE) 0.4-0.3 % SOLN    Place 1 drop into both eyes as needed (dry/irritated eyes).   PROBIOTIC PRODUCT (PROBIOTIC COLON SUPPORT PO)    Take 1 tablet by mouth in the morning and at bedtime.   VALACYCLOVIR (VALTREX) 500 MG TABLET    TAKE (1) TABLET DAILY AS NEEDED.  Modified Medications   No medications on file  Discontinued Medications   No medications on file    Physical Exam:  There were no vitals filed for this visit. There is no height or weight on file to  calculate BMI. Wt Readings from Last 3 Encounters:  12/22/20 135 lb 6.4 oz (61.4 kg)  09/22/20 131 lb 14.4 oz (59.8 kg)  09/08/20 127 lb 1.6 oz (57.7 kg)    Physical Exam Vitals reviewed.  Constitutional:      Appearance: Normal appearance.  Cardiovascular:     Rate and Rhythm: Normal rate.  Pulmonary:     Effort: Pulmonary effort is normal.  Abdominal:     General: Abdomen is flat. There is no distension.     Palpations: Abdomen is soft.     Tenderness: There is no abdominal tenderness. There is no guarding.  Skin:    Comments: There is a small movable lump in the left lower leg just distal to the knee.  This has the feel of a dermal cyst.  I have asked her to check it (infrequently) and if there is change we may want to ultrasound or x-ray.  There is tenderness to the left foot at about the fourth metatarsal head.  Will send for x-ray to rule out stress fracture  Neurological:     General: No focal deficit present.     Mental Status: She is alert and oriented to person, place, and time.    Labs reviewed: Basic Metabolic Panel: Recent Labs    08/17/20 1543 08/17/20 1804 08/21/20 0416 08/21/20 1259 08/22/20 0415 08/23/20 0158 08/31/20 0346 09/20/20 0815  NA 115*   < > 131*   < > 135 136 138 141  K 4.7   < > 4.3  --  4.2 4.2 3.8 3.8  CL 83*   < > 94*  --  98 101 103 104  CO2 25   < > 29  --  '30 27 28 29  '$ GLUCOSE 103*   < > 180*  --  89 110* 109* 101*  BUN 8   < >  20  --  '15 12 11 7  '$ CREATININE 0.46   < > 0.62  --  0.68 0.64 0.59 0.66  CALCIUM 7.5*   < > 8.7*  --  8.3* 8.7* 8.8* 9.0  MG  --    < > 1.7  --  1.7 1.9  --   --   TSH 1.837  --   --   --   --   --   --  2.04   < > = values in this interval not displayed.   Liver Function Tests: Recent Labs    08/31/20 0346 09/20/20 0815  AST 24 17  ALT 19 12  ALKPHOS 96  --   BILITOT 0.6 0.4  PROT 5.9* 6.2  ALBUMIN 3.4*  --    No results for input(s): LIPASE, AMYLASE in the last 8760 hours. No results for  input(s): AMMONIA in the last 8760 hours. CBC: Recent Labs    08/23/20 0158 08/31/20 0346 09/20/20 0815  WBC 19.5* 8.4 5.9  NEUTROABS  --  5.7 4,024  HGB 8.6* 9.2* 11.3*  HCT 26.2* 28.1* 35.4  MCV 100.8* 102.6* 101.7*  PLT 489* 498* 378   Lipid Panel: No results for input(s): CHOL, HDL, LDLCALC, TRIG, CHOLHDL, LDLDIRECT in the last 8760 hours. TSH: Recent Labs    08/17/20 1543 09/20/20 0815  TSH 1.837 2.04   A1C: No results found for: HGBA1C   Assessment/Plan  1. Foot pain, left X-ray pending to rule out stress fracture.  If positive will encourage her to wear a postop shoe that she already has and continue walking, but less - DG Foot Complete Left  2. Barrett's esophagus with dysplasia Her symptoms are suspicious for acid peptic disease rather than Barrett's.  We will discontinue Zegerid in favor of Protonix twice daily for 2 weeks.  Try to avoid foods with acid such as tomato and tomato based products and spicy foods - pantoprazole (PROTONIX) 40 MG tablet; Take 1 tablet (40 mg total) by mouth daily.  Dispense: 30 tablet; Refill: Walsh, MD [facility] [phone]

## 2021-02-10 ENCOUNTER — Telehealth: Payer: Self-pay | Admitting: *Deleted

## 2021-02-10 NOTE — Telephone Encounter (Signed)
Patient called and stated that she saw Dr. Sabra Heck this week and an X-Ray was ordered on her Left Foot and she is wondering about the results.   Would like to know the results of this X-Ray.  Please Advise. (Forwarded to [place] due to Dr. Sabra Heck out of office

## 2021-02-10 NOTE — Telephone Encounter (Signed)
Completed under results.

## 2021-02-17 ENCOUNTER — Ambulatory Visit (INDEPENDENT_AMBULATORY_CARE_PROVIDER_SITE_OTHER): Payer: Medicare Other | Admitting: Nurse Practitioner

## 2021-02-17 ENCOUNTER — Other Ambulatory Visit: Payer: Self-pay

## 2021-02-17 ENCOUNTER — Telehealth: Payer: Self-pay

## 2021-02-17 ENCOUNTER — Encounter: Payer: Self-pay | Admitting: Nurse Practitioner

## 2021-02-17 DIAGNOSIS — Z1159 Encounter for screening for other viral diseases: Secondary | ICD-10-CM | POA: Diagnosis not present

## 2021-02-17 DIAGNOSIS — Z0000 Encounter for general adult medical examination without abnormal findings: Secondary | ICD-10-CM

## 2021-02-17 NOTE — Progress Notes (Signed)
This service is provided via telemedicine  No vital signs collected/recorded due to the encounter was a telemedicine visit.   Location of patient (ex: home, work):  [place]  Patient consents to a telephone visit:  Yes, see encounter dated 02/17/2021  Location of the provider (ex: office, home):  [place]  Name of any referring provider:  Alain Honey M.,MD  Names of all persons participating in the telemedicine service and their role in the encounter:  Sherrie Mustache, Nurse Practitioner, [name], CMA, and patient.   Time spent on call:  12 minutes with medical assistant

## 2021-02-17 NOTE — Patient Instructions (Signed)
[name] , Thank you for taking time to come for your Medicare Wellness Visit. I appreciate your ongoing commitment to your health goals. Please review the following plan we discussed and let me know if I can assist you in the future.   Screening recommendations/referrals: Colonoscopy aged out Mammogram up to date Bone Density up to date Recommended yearly ophthalmology/optometry visit for glaucoma screening and checkup Recommended yearly dental visit for hygiene and checkup  Vaccinations: Influenza vaccine DUE at this time Pneumococcal vaccine up to date Tdap vaccine up to date Shingles vaccine up to date [place] to get at your local pharmacy.   Advanced directives: recommended to bring to office to place on file.   Conditions/risks identified: advance age  Next appointment: 1 year    Preventive Care 78 Years and Older, Female Preventive care refers to lifestyle choices and visits with your health care provider that can promote health and wellness. What does preventive care include? A yearly physical exam. This is also called an annual well check. Dental exams once or twice a year. Routine eye exams. Ask your health care provider how often you should have your eyes checked. Personal lifestyle choices, including: Daily care of your teeth and gums. Regular physical activity. Eating a healthy diet. Avoiding tobacco and drug use. Limiting alcohol use. Practicing safe sex. Taking low-dose aspirin every day. Taking vitamin and mineral supplements as recommended by your health care provider. What happens during an annual well check? The services and screenings done by your health care provider during your annual well check will depend on your age, overall health, lifestyle risk factors, and family history of disease. Counseling  Your health care provider may ask you questions about your: Alcohol use. Tobacco use. Drug use. Emotional well-being. Home and relationship  well-being. Sexual activity. Eating habits. History of falls. Memory and ability to understand (cognition). Work and work Statistician. Reproductive health. Screening  You may have the following tests or measurements: Height, weight, and BMI. Blood pressure. Lipid and cholesterol levels. These may be checked every 5 years, or more frequently if you are over 78 years old. Skin check. Lung cancer screening. You may have this screening every year starting at age 78 if you have a 30-pack-year history of smoking and currently smoke or have quit within the past 15 years. Fecal occult blood test (FOBT) of the stool. You may have this test every year starting at age 78. Flexible sigmoidoscopy or colonoscopy. You may have a sigmoidoscopy every 5 years or a colonoscopy every 10 years starting at age 8. Hepatitis C blood test. Hepatitis B blood test. Sexually transmitted disease (STD) testing. Diabetes screening. This is done by checking your blood sugar (glucose) after you have not eaten for a while (fasting). You may have this done every 1-3 years. Bone density scan. This is done to screen for osteoporosis. You may have this done starting at age 26. Mammogram. This may be done every 1-2 years. Talk to your health care provider about how often you should have regular mammograms. Talk with your health care provider about your test results, treatment options, and if necessary, the need for more tests. Vaccines  Your health care provider may recommend certain vaccines, such as: Influenza vaccine. This is recommended every year. Tetanus, diphtheria, and acellular pertussis (Tdap, Td) vaccine. You may need a Td booster every 10 years. Zoster vaccine. You may need this after age 76. Pneumococcal 13-valent conjugate (PCV13) vaccine. One dose is recommended after age 27. Pneumococcal  polysaccharide (PPSV23) vaccine. One dose is recommended after age 16. Talk to your health care provider about which  screenings and vaccines you need and how often you need them. This information is not intended to replace advice given to you by your health care provider. Make sure you discuss any questions you have with your health care provider. Document Released: 06/25/2015 Document Revised: 02/16/2016 Document Reviewed: 03/30/2015 Elsevier Interactive Patient Education  2017 [employer] Prevention in the Home Falls can cause injuries. They can happen to people of all ages. There are many things you can do to make your home safe and to help prevent falls. What can I do on the outside of my home? Regularly fix the edges of walkways and driveways and fix any cracks. Remove anything that might make you trip as you walk through a door, such as a raised step or threshold. Trim any bushes or trees on the path to your home. Use bright outdoor lighting. Clear any walking paths of anything that might make someone trip, such as rocks or tools. Regularly check to see if handrails are loose or broken. Make sure that both sides of any steps have handrails. Any raised decks and porches should have guardrails on the edges. Have any leaves, snow, or ice cleared regularly. Use sand or salt on walking paths during winter. Clean up any spills in your garage right away. This includes oil or grease spills. What can I do in the bathroom? Use night lights. Install grab bars by the toilet and in the tub and shower. Do not use towel bars as grab bars. Use non-skid mats or decals in the tub or shower. If you need to sit down in the shower, use a plastic, non-slip stool. Keep the floor dry. Clean up any water that spills on the floor as soon as it happens. Remove soap buildup in the tub or shower regularly. Attach bath mats securely with double-sided non-slip rug tape. Do not have throw rugs and other things on the floor that can make you trip. What can I do in the bedroom? Use night lights. Make sure that you have a  light by your bed that is easy to reach. Do not use any sheets or blankets that are too big for your bed. They should not hang down onto the floor. Have a firm chair that has side arms. You can use this for support while you get dressed. Do not have throw rugs and other things on the floor that can make you trip. What can I do in the kitchen? Clean up any spills right away. Avoid walking on wet floors. Keep items that you use a lot in easy-to-reach places. If you need to reach something above you, use a strong step stool that has a grab bar. Keep electrical cords out of the way. Do not use floor polish or wax that makes floors slippery. If you must use wax, use non-skid floor wax. Do not have throw rugs and other things on the floor that can make you trip. What can I do with my stairs? Do not leave any items on the stairs. Make sure that there are handrails on both sides of the stairs and use them. Fix handrails that are broken or loose. Make sure that handrails are as long as the stairways. Check any carpeting to make sure that it is firmly attached to the stairs. Fix any carpet that is loose or worn. Avoid having throw rugs at the top  or bottom of the stairs. If you do have throw rugs, attach them to the floor with carpet tape. Make sure that you have a light switch at the top of the stairs and the bottom of the stairs. If you do not have them, ask someone to add them for you. What else can I do to help prevent falls? Wear shoes that: Do not have high heels. Have rubber bottoms. Are comfortable and fit you well. Are closed at the toe. Do not wear sandals. If you use a stepladder: Make sure that it is fully opened. Do not climb a closed stepladder. Make sure that both sides of the stepladder are locked into place. Ask someone to hold it for you, if possible. Clearly mark and make sure that you can see: Any grab bars or handrails. First and last steps. Where the edge of each step  is. Use tools that help you move around (mobility aids) if they are needed. These include: Canes. Walkers. Scooters. Crutches. Turn on the lights when you go into a dark area. Replace any light bulbs as soon as they burn out. Set up your furniture so you have a clear path. Avoid moving your furniture around. If any of your floors are uneven, fix them. If there are any pets around you, be aware of where they are. Review your medicines with your doctor. Some medicines can make you feel dizzy. This can increase your chance of falling. Ask your doctor what other things that you can do to help prevent falls. This information is not intended to replace advice given to you by your health care provider. Make sure you discuss any questions you have with your health care provider. Document Released: 03/25/2009 Document Revised: 11/04/2015 Document Reviewed: 07/03/2014 Elsevier Interactive Patient Education  2017 [employer].

## 2021-02-17 NOTE — Progress Notes (Signed)
Subjective:   Carol Perez is a 80 y.o. female who presents for Medicare Annual (Subsequent) preventive examination.  Review of Systems     Cardiac Risk Factors include: advanced age (>35mn, >>67women)     Objective:    There were no vitals filed for this visit. There is no height or weight on file to calculate BMI.  Advanced Directives 02/17/2021 02/08/2021 08/25/2020 08/12/2020 08/09/2020 04/30/2020 09/26/2019  Does Patient Have a Medical Advance Directive? Yes No Yes - Yes Yes Yes  Type of AParamedicof ACarawayLiving will - HWindsorLiving will - HBangorLiving will HDrumrightLiving will [place]villeLiving will  Does patient want to make changes to medical advance directive? No - Patient declined - - No - Patient declined No - Patient declined No - Patient declined No - Patient declined  Copy of HMill [place]in Chart? No - copy requested - No - copy requested - No - copy requested No - copy requested No - copy requested  Would patient like information on creating a medical advance directive? - No - Patient declined - - - - -    Current Medications (verified) Outpatient Encounter Medications as of 02/17/2021  Medication Sig   aspirin EC 81 MG tablet Take 81 mg by mouth daily with breakfast.   B Complex-C (B-COMPLEX WITH VITAMIN C) tablet Take 1 tablet by mouth daily.   Biotin 5000 MCG TABS Take 5,000 mcg by mouth daily.   CALCIUM PO Take 1,200 mg by mouth daily.    Cholecalciferol (VITAMIN D3) 50 MCG (2000 UT) TABS Take 2,000 Units by mouth in the morning, at noon, and at bedtime.   conjugated estrogens (PREMARIN) vaginal cream Place 1 Applicatorful vaginally 3 (three) times a week.   denosumab (PROLIA) 60 MG/ML SOSY injection Inject 60 mg into the skin every 6 (six) months.   gabapentin (NEURONTIN) 100 MG capsule Take 100 mg by mouth at bedtime.   Glucosamine HCl 1500  MG TABS Take 1,500 mg by mouth daily.   Loperamide-Simethicone 2-125 MG TABS Take 1 tablet by mouth 4 (four) times daily as needed (diarrhea/loose stools).   methenamine (HIPREX) 1 g tablet Take 1 g by mouth 2 (two) times daily with a meal. For UTI Prevention. Last UTI: October 20th, November 5th, and December 28th.   Multiple Vitamin (MULTIVITAMIN WITH MINERALS) TABS tablet Take 1 tablet by mouth daily. One A Day for Women   Omeprazole-Sodium Bicarbonate (ZEGERID) 20-1100 MG CAPS capsule Take 1 capsule by mouth 2 (two) times daily.   Polyethyl Glycol-Propyl Glycol (SYSTANE) 0.4-0.3 % SOLN Place 1 drop into both eyes as needed (dry/irritated eyes).   Probiotic Product (PROBIOTIC COLON SUPPORT PO) Take 1 tablet by mouth in the morning and at bedtime.   valACYclovir (VALTREX) 500 MG tablet TAKE (1) TABLET DAILY AS NEEDED.   [DISCONTINUED] Methenamine-Sodium Salicylate (CYSTEX PO) Take 100 mg by mouth daily. ( Cranberry Concentrate )   [DISCONTINUED] pantoprazole (PROTONIX) 40 MG tablet Take 1 tablet (40 mg total) by mouth daily.   No facility-administered encounter medications on file as of 02/17/2021.    Allergies (verified) Banana, Latex, Ciprofloxacin hcl, Gatifloxacin, Levofloxacin, Moxifloxacin, Norfloxacin, Nsaids, Ofloxacin, Other, Penicillins, Methylisothiazolinone, and Tape   History: Past Medical History:  Diagnosis Date   Barrett's esophagus    Colitis    DDD (degenerative disc disease)    Erroneous encounter - disregard    error  Erroneous encounter - disregard    Erroneous encounter - disregard    Factor 5 Leiden mutation, heterozygous [facility])    Per [facility] New Patient Packet.   Frequent UTI    H/O echocardiogram 04/01/2019   Per [place] Patient Packet.   Hiatal hernia    Per [facility] New Patient Packet.   High grade dysplasia of Barrett's epithelium    Per [facility] New Patient Packet.   History of bladder infections    Per [facility] New Patient Packet.   History of bone density study  07/12/2018   Per [place] Patient Packet.   History of bone density study 07/12/2018   Up coming appointment 07/17/2019, Per [facility] New Patient Packet   History of bone density study 07/17/2019   History of endoscopy 12/17/2015   Needed Every 3 years. Scheduled for January 2021. Per [facility] New Patient Packet.    History of mammogram 04/14/2019   Per [place] Patient Packet.   History of MRI 03/01/2019   By Dr.Gulati/ Neurologist. MRI of Brain. Per [facility] New Patient Packet.   History of MRI 04/23/2019   By Dr. Maryan Rued at [facility]. Per [facility] New Patient Packet.   History of Papanicolaou smear of cervix 10/16/2011   Per [place] New Patient Packet   Hypotension    Per [facility] New Patient Packet.   IBS (irritable bowel syndrome)    Per [place] New Patient Packet.   Lactose intolerance    Osteoarthritis    Per [place] New Patient Packet.   Osteopenia    Per [facility] New Patient Packet.   Osteoporosis    Premature ventricular complex    Raynaud's disease    Per [place] Patient Packet.   Scoliosis    Per [facility] New Patient Packet.   Past Surgical History:  Procedure Laterality Date   ABDOMINAL EXPOSURE N/A 08/12/2020   Procedure: ABDOMINAL EXPOSURE;  Surgeon: Serafina Mitchell, MD;  Location: [facility] OR;  Service: Vascular;  Laterality: N/A;   ABDOMINAL HYSTERECTOMY  06/13/2011   By Dr.Scherer at [facility]. Per [facility] New Patient Packet.   ANTERIOR LAT LUMBAR FUSION Right 08/12/2020   Procedure: Right Lumbar two-three, Lumbar three-four, Lumbar four-five Anterolateral lumbar interbody fusion;  Surgeon: Erline Levine, MD;  Location: [facility];  Service: Neurosurgery;  Laterality: Right;   ANTERIOR LUMBAR FUSION N/A 08/12/2020   Procedure: Lumbar five Sacral one Anterior lumbar interbody fusion;  Surgeon: Erline Levine, MD;  Location: [place];  Service: Neurosurgery;  Laterality: N/A;   BREAST EXCISIONAL BIOPSY Left 30+ yrs ago   COLONOSCOPY  11/23/2011   By Dr.McCune at [facility]. Per [facility] Patient  Packet.   COLONOSCOPY  08/26/2019   LUMBAR PERCUTANEOUS PEDICLE SCREW 4 LEVEL N/A 08/12/2020   Procedure: Percutaneous pedicle screw fixation from Lumbar two to Sacral one ;  Surgeon: Erline Levine, MD;  Location: [employer];  Service: Neurosurgery;  Laterality: N/A;   MAMMOGRAM  04/14/2019   pap smear  10/16/2011   SIGMOIDOSCOPY  11/02/2014   Per [place] New Patient Packet   TONSILLECTOMY  06/12/1948   Per [facility] New Patient Packet.   Family History  Problem Relation Age of Onset   Cancer Mother    Cancer Father    Social History   Socioeconomic History   Marital status: Married    Spouse name: Not on file   Number of children: 2   Years of education: Not on file   Highest education level: Associate degree: occupational, Hotel manager,  or vocational program  Occupational History   Occupation: Retired   Tobacco Use   Smoking status: Former    Types: Cigarettes    Start date: 06/12/1977   Smokeless tobacco: Never   Tobacco comments:    42 years ago. Smoked from age [age]  Vaping Use   Vaping Use: Never used  Substance and Sexual Activity   Alcohol use: Yes    Alcohol/week: 7.0 standard drinks    Types: 7 Glasses of wine per week    Comment: glass of wine daily   Drug use: No   Sexual activity: Not on file  Other Topics Concern   Not on file  Social History Narrative   Tobacco use, amount per day now: No   Past tobacco use, amount per day: Smoked ages [age]   How many years did you use tobacco: 17-20 years   Alcohol use (drinks per week): 1 glass of Red Wine per day.   Diet: Good   Do you drink/eat things with caffeine: No   Marital status: Married                                  What year were you married? 1978   Do you live in a house, apartment, assisted living, condo, trailer, etc.? Apartment/Independent Living   Is it one or more stories? 1   How many persons live in your home? 2    Do you have pets in your home?( please list) No   Highest Level of Education completed? 2 years  of college.    Current or past profession: Retired Futures trader   Do you exercise?  Yes                                Type and how often? 66mn workout 5 days week. Walking 3-4 Days a Week.    Do you have a living will? Yes   Do you have a DNR form?  Yes                                 If not, do you want to discuss one?   Do you have signed POA/HPOA forms? Yes                       If so, please bring to you appointment      Do you have difficulty bathing or dressing yourself? No   Do you have difficulty preparing food or eating? No   Do you have difficulty managing your medications? No   Do you have difficulty managing your finances? No   Do you have difficulty affording your medications? No       [facility] Abstracted by Jasmine/RMA.    Social Determinants of Health   Financial Resource Strain: Not on file  Food Insecurity: Not on file  Transportation Needs: Not on file  Physical Activity: Not on file  Stress: Not on file  Social Connections: Not on file    Tobacco Counseling Counseling given: Not Answered Tobacco comments: 42 years ago. Smoked from age [age]  Clinical Intake:  Pre-visit preparation completed: Yes  Pain : No/denies pain     BMI - recorded: 24 Nutritional Status: BMI of 19-24  Normal  Nutritional Risks: Unintentional weight loss  How often do you need to have someone help you when you read instructions, pamphlets, or other written materials from your doctor or pharmacy?: 1 - Never  [place] of Daily Living In your present state of health, do you have any difficulty performing the following activities: 02/17/2021 08/12/2020  Hearing? N -  Vision? N -  Difficulty concentrating or making decisions? N -  Walking or climbing stairs? N -  Dressing or bathing? N -  Doing errands, shopping? N N  Preparing Food and eating ? N -  Using the Toilet? N -  In the past six months, have you accidently leaked  urine? Y -  Do you have problems with loss of bowel control? N -  Managing your Medications? N -  Managing your Finances? N -  Housekeeping or managing your Housekeeping? N -  Some recent data might be hidden    Patient Care Team: Wardell Honour, MD as PCP - General (Family Medicine) Rozetta Nunnery, MD as Consulting Physician (Otolaryngology) Luberta Mutter, MD as Consulting Physician (Ophthalmology)  Indicate any recent Medical Services you may have received from other than [facility] providers in the past year (date may be approximate).     Assessment:   This is a routine wellness examination for Carol Perez.  Hearing/Vision screen Hearing Screening - Comments:: Patient has no hearing problems Vision Screening - Comments:: Patient has no vision problems. Patient had eye exam a year ago. Appointment in October. [facility]  Dietary issues and exercise activities discussed: Current Exercise Habits: Home exercise routine, Type of exercise: walking, Time (Minutes): 20, Frequency (Times/Week): 5, Weekly Exercise (Minutes/Week): 100   Goals Addressed   None   Depression Screen PHQ 2/9 Scores 02/17/2021  PHQ - 2 Score 0    Fall Risk Fall Risk  02/17/2021 02/08/2021 12/22/2020 09/22/2020 05/12/2020  Falls in the past year? 0 0 0 0 0  Number falls in past yr: 0 0 0 0 0  Injury with Fall? 0 0 0 - -  Risk for fall due to : No Fall Risks No Fall Risks No Fall Risks - -  Follow up Falls evaluation completed Falls evaluation completed Falls evaluation completed;Education provided;Falls prevention discussed - -    FALL RISK PREVENTION PERTAINING TO THE HOME:  Any stairs in or around the home? No  If so, are there any without handrails? No  Home free of loose throw rugs in walkways, pet beds, electrical cords, etc? No  rugs in kitchen  Adequate lighting in your home to reduce risk of falls? Yes   ASSISTIVE DEVICES UTILIZED TO PREVENT FALLS:  Life alert? No  Use of a  cane, walker or w/c? No  Grab bars in the bathroom? Yes  Shower chair or bench in shower? Yes  Elevated toilet seat or a handicapped toilet? No   TIMED UP AND GO:  Was the test performed? No .   Cognitive Function:     6CIT Screen 02/17/2021  What Year? 0 points  What month? 0 points  What time? 0 points  Count back from 20 0 points  Months in reverse 0 points  Repeat phrase 0 points  Total Score 0    Immunizations Immunization History  Administered Date(s) Administered   Influenza, High Dose Seasonal PF 03/26/2019   Moderna Sars-Covid-2 Vaccination 06/16/2019, 07/14/2019, 04/26/2020   Pneumococcal Conjugate-13 12/12/2014   Pneumococcal Polysaccharide-23 11/20/2019  Tdap 04/08/2008, 11/20/2019   Zoster Recombinat (Shingrix) 06/12/2017    TDAP status: Up to date  Flu Vaccine status: Due, Education has been provided regarding the importance of this vaccine. Advised may receive this vaccine at local pharmacy or Health Dept. Aware to provide a copy of the vaccination record if obtained from local pharmacy or Health Dept. Verbalized acceptance and understanding.  Pneumococcal vaccine status: Up to date  Covid-19 vaccine status: Information provided on how to obtain vaccines.   Qualifies for Shingles Vaccine? Yes   Zostavax completed No   Shingrix Completed?: No.    Education has been provided regarding the importance of this vaccine. Patient has been advised to call insurance company to determine out of pocket expense if they have not yet received this vaccine. Advised may also receive vaccine at local pharmacy or Health Dept. Verbalized acceptance and understanding.  Screening Tests Health Maintenance  Topic Date Due   Hepatitis C Screening  Never done   Zoster Vaccines- Shingrix (2 of 2) 08/07/2017   COVID-19 Vaccine (4 - Booster for Moderna series) 08/24/2020   INFLUENZA VACCINE  01/10/2021   TETANUS/TDAP  11/19/2029   DEXA SCAN  Completed   PNA vac Low Risk Adult   Completed   HPV VACCINES  Aged Out    Health Maintenance  Health Maintenance Due  Topic Date Due   Hepatitis C Screening  Never done   Zoster Vaccines- Shingrix (2 of 2) 08/07/2017   COVID-19 Vaccine (4 - Booster for Moderna series) 08/24/2020   INFLUENZA VACCINE  01/10/2021    Colorectal cancer screening: No longer required.   Mammogram status: No longer required due to age.  Bone Density status: Completed 07/17/19. Results reflect: Bone density results: OSTEOPENIA. Repeat every 2 years.  Lung Cancer Screening: (Low Dose CT Chest recommended if Age 60-80 years, 30 pack-year currently smoking OR have quit w/in 15years.) does not qualify.   Lung Cancer Screening Referral: na  Additional Screening:  Hepatitis C Screening: does qualify  Vision Screening: Recommended annual ophthalmology exams for early detection of glaucoma and other disorders of the eye. Is the patient up to date with their annual eye exam?  Yes  Who is the provider or what is the name of the office in which the patient attends annual eye exams? [facility] If pt is not established with a provider, would they like to be referred to a provider to establish care? No .   Dental Screening: Recommended annual dental exams for proper oral hygiene  Community Resource Referral / Chronic Care Management: CRR required this visit?  No   CCM required this visit?  No      Plan:     I have personally reviewed and noted the following in the patient's chart:   Medical and social history Use of alcohol, tobacco or illicit drugs  Current medications and supplements including opioid prescriptions.  Functional ability and status Nutritional status Physical activity Advanced directives List of other physicians Hospitalizations, surgeries, and ER visits in previous 12 months Vitals Screenings to include cognitive, depression, and falls Referrals and appointments  In addition, I have reviewed and discussed with  patient certain preventive protocols, quality metrics, and best practice recommendations. A written personalized care plan for preventive services as well as general preventive health recommendations were provided to patient.     Lauree Chandler, NP   02/17/2021    Virtual Visit via Telephone Note  I connected withNAME@ on 02/17/21 at  9:30 AM EDT  by telephone and verified that I am speaking with the correct person using two identifiers.  Location: Patient: home Provider: [place]   I discussed the limitations, risks, security and privacy concerns of performing an evaluation and management service by telephone and the availability of in person appointments. I also discussed with the patient that there may be a patient responsible charge related to this service. The patient expressed understanding and agreed to proceed.   I discussed the assessment and treatment plan with the patient. The patient was provided an opportunity to ask questions and all were answered. The patient agreed with the plan and demonstrated an understanding of the instructions.   The patient was advised to call back or seek an in-person evaluation if the symptoms worsen or if the condition fails to improve as anticipated.  I provided 18 minutes of non-face-to-face time during this encounter.  Carlos American. Harle Battiest Avs printed and mailed

## 2021-02-17 NOTE — Telephone Encounter (Signed)
[name], gatson are scheduled for a virtual visit with your provider today.    Just as we do with appointments in the office, we must obtain your consent to participate.  Your consent will be active for this visit and any virtual visit you may have with one of our providers in the next 365 days.    If you have a MyChart account, I can also send a copy of this consent to you electronically.  All virtual visits are billed to your insurance company just like a traditional visit in the office.  As this is a virtual visit, video technology does not allow for your provider to perform a traditional examination.  This may limit your provider's ability to fully assess your condition.  If your provider identifies any concerns that need to be evaluated in person or the need to arrange testing such as labs, EKG, etc, we will make arrangements to do so.    Although advances in technology are sophisticated, we cannot ensure that it will always work on either your end or our end.  If the connection with a video visit is poor, we may have to switch to a telephone visit.  With either a video or telephone visit, we are not always able to ensure that we have a secure connection.   I need to obtain your verbal consent now.   Are you willing to proceed with your visit today?   Carol Perez has provided verbal consent on 02/17/2021 for a virtual visit (video or telephone).   [name], CMA 02/17/2021  9:41 AM

## 2021-03-02 DIAGNOSIS — M4120 Other idiopathic scoliosis, site unspecified: Secondary | ICD-10-CM | POA: Diagnosis not present

## 2021-03-02 DIAGNOSIS — M5416 Radiculopathy, lumbar region: Secondary | ICD-10-CM | POA: Diagnosis not present

## 2021-03-02 DIAGNOSIS — M5442 Lumbago with sciatica, left side: Secondary | ICD-10-CM | POA: Diagnosis not present

## 2021-03-15 ENCOUNTER — Other Ambulatory Visit: Payer: Self-pay | Admitting: Gastroenterology

## 2021-03-15 ENCOUNTER — Ambulatory Visit
Admission: RE | Admit: 2021-03-15 | Discharge: 2021-03-15 | Disposition: A | Discharge status: Home / Self Care | Payer: Medicare Other | Source: Ambulatory Visit | Attending: Gastroenterology | Admitting: Gastroenterology

## 2021-03-15 DIAGNOSIS — K5900 Constipation, unspecified: Secondary | ICD-10-CM | POA: Diagnosis not present

## 2021-03-15 DIAGNOSIS — K2270 Barrett's esophagus without dysplasia: Secondary | ICD-10-CM | POA: Diagnosis not present

## 2021-03-15 DIAGNOSIS — K52832 Lymphocytic colitis: Secondary | ICD-10-CM | POA: Diagnosis not present

## 2021-03-15 DIAGNOSIS — K529 Noninfective gastroenteritis and colitis, unspecified: Secondary | ICD-10-CM | POA: Diagnosis not present

## 2021-03-15 DIAGNOSIS — Z86010 Personal history of colonic polyps: Secondary | ICD-10-CM | POA: Diagnosis not present

## 2021-03-22 ENCOUNTER — Other Ambulatory Visit: Payer: Self-pay | Admitting: Gastroenterology

## 2021-03-22 ENCOUNTER — Other Ambulatory Visit: Payer: Self-pay

## 2021-03-22 ENCOUNTER — Ambulatory Visit
Admission: RE | Admit: 2021-03-22 | Discharge: 2021-03-22 | Disposition: A | Discharge status: Home / Self Care | Payer: Medicare Other | Source: Ambulatory Visit | Attending: Gastroenterology | Admitting: Gastroenterology

## 2021-03-22 DIAGNOSIS — K5900 Constipation, unspecified: Secondary | ICD-10-CM

## 2021-03-22 DIAGNOSIS — R1012 Left upper quadrant pain: Secondary | ICD-10-CM | POA: Diagnosis not present

## 2021-03-28 ENCOUNTER — Other Ambulatory Visit: Payer: Self-pay | Admitting: Internal Medicine

## 2021-03-28 NOTE — Telephone Encounter (Signed)
Pharmacy requested refill.  ?Pended Rx and sent to Dr. Miller for approval.  ?

## 2021-04-04 DIAGNOSIS — N3020 Other chronic cystitis without hematuria: Secondary | ICD-10-CM | POA: Diagnosis not present

## 2021-04-06 ENCOUNTER — Other Ambulatory Visit: Payer: Self-pay

## 2021-04-06 ENCOUNTER — Encounter: Payer: Self-pay | Admitting: Family

## 2021-04-06 ENCOUNTER — Ambulatory Visit (INDEPENDENT_AMBULATORY_CARE_PROVIDER_SITE_OTHER): Payer: Medicare Other | Admitting: Family

## 2021-04-06 VITALS — BP 150/90 | HR 72 | Temp 98.2°F | Resp 16 | Ht 62.0 in | Wt 138.2 lb

## 2021-04-06 DIAGNOSIS — J029 Acute pharyngitis, unspecified: Secondary | ICD-10-CM | POA: Diagnosis not present

## 2021-04-06 MED ORDER — DOXYCYCLINE HYCLATE 100 MG PO TABS: 100.0000 mg | ORAL_TABLET | Freq: Two times a day (BID) | ORAL | 0 refills | Status: AC

## 2021-04-06 NOTE — Progress Notes (Signed)
Provider: Luci Bellucci FNP-C  Wardell Honour, MD  Patient Care Team: Wardell Honour, MD as PCP - General (Family Medicine) Rozetta Nunnery, MD as Consulting Physician (Otolaryngology) Luberta Mutter, MD as Consulting Physician (Ophthalmology)  Extended Emergency Contact Information Primary Emergency Contact: Voller,William M Address: [phone] [place]          [place], [address] [address] Johnnette Litter of [place] Phone: [phone] Mobile Phone: [phone] Relation: Spouse Secondary Emergency Contact: Reeves,Pam    POA Address: [place],  [address] [place] of [place] Phone: [phone] Relation: Niece Preferred language: English Interpreter needed? No  Code Status:  Full Code  Goals of care: Advanced Directive information Advanced Directives 02/17/2021  Does Patient Have a Medical Advance Directive? Yes  Type of Paramedic of [place];Living will  Does patient want to make changes to medical advance directive? No - Patient declined  Copy of [place] in Chart? No - copy requested  Would patient like information on creating a medical advance directive? -     Chief Complaint  Patient presents with   Acute Visit    Patient complains of swollen gland under left jaw.    HPI:  Pt is a 80 y.o. female seen today for an acute visit for evaluation of swollen gland left jaw x 2 days.states feels warm but her thermometer not working.Has been coughing.describes cough as dry.Has drainage running down her throat.right nose tends to be congested.left ear tends to be dry so she uses OTC drops.usually when she moisturizer it goes away.  She denies any fever or chills. States following up with GI for chronic diarrhea.states imaging done was told had constipation to avoid overuse of imodium.states since she became lactose intolerant has not been able to ear leafy  vegetables.    Past Medical History:  Diagnosis Date   Barrett's esophagus    Colitis    DDD (degenerative disc disease)    Erroneous encounter - disregard    error    Erroneous encounter - disregard    Erroneous encounter - disregard    Factor 5 Leiden mutation, heterozygous [facility])    Per [facility] Packet.   Frequent UTI    H/O echocardiogram 04/01/2019   Per [facility] Patient Packet.   Hiatal hernia    Per [place] New Patient Packet.   High grade dysplasia of Barrett's epithelium    Per [facility] New Patient Packet.   History of bladder infections    Per [facility] New Patient Packet.   History of bone density study 07/12/2018   Per [facility] Patient Packet.   History of bone density study 07/12/2018   Up coming appointment 07/17/2019, Per [facility] New Patient Packet   History of bone density study 07/17/2019   History of endoscopy 12/17/2015   Needed Every 3 years. Scheduled for January 2021. Per [facility] New Patient Packet.    History of mammogram 04/14/2019   Per [facility] Patient Packet.   History of MRI 03/01/2019   By Dr.Gulati/ Neurologist. MRI of Brain. Per [facility] New Patient Packet.   History of MRI 04/23/2019   By Dr. Maryan Rued at [facility]. Per [facility] New Patient Packet.   History of Papanicolaou smear of cervix 10/16/2011   Per [place] New Patient Packet   Hypotension    Per [facility] New Patient Packet.   IBS (irritable bowel syndrome)  Per [facility] New Patient Packet.   Lactose intolerance    Osteoarthritis    Per [facility] New Patient Packet.   Osteopenia    Per [facility] New Patient Packet.   Osteoporosis    Premature ventricular complex    Raynaud's disease    Per [facility] Patient Packet.   Scoliosis    Per [facility] New Patient Packet.   Past Surgical History:  Procedure Laterality Date   ABDOMINAL EXPOSURE N/A 08/12/2020   Procedure: ABDOMINAL EXPOSURE;  Surgeon: Serafina Mitchell, MD;  Location: [facility] OR;  Service: Vascular;  Laterality: N/A;   ABDOMINAL HYSTERECTOMY  06/13/2011   By Dr.Scherer at  [facility]. Per [facility] New Patient Packet.   ANTERIOR LAT LUMBAR FUSION Right 08/12/2020   Procedure: Right Lumbar two-three, Lumbar three-four, Lumbar four-five Anterolateral lumbar interbody fusion;  Surgeon: Erline Levine, MD;  Location: [facility];  Service: Neurosurgery;  Laterality: Right;   ANTERIOR LUMBAR FUSION N/A 08/12/2020   Procedure: Lumbar five Sacral one Anterior lumbar interbody fusion;  Surgeon: Erline Levine, MD;  Location: [facility];  Service: Neurosurgery;  Laterality: N/A;   BREAST EXCISIONAL BIOPSY Left 30+ yrs ago   COLONOSCOPY  11/23/2011   By Dr.McCune at [facility]. Per [facility] Patient Packet.   COLONOSCOPY  08/26/2019   LUMBAR PERCUTANEOUS PEDICLE SCREW 4 LEVEL N/A 08/12/2020   Procedure: Percutaneous pedicle screw fixation from Lumbar two to Sacral one ;  Surgeon: Erline Levine, MD;  Location: [facility];  Service: Neurosurgery;  Laterality: N/A;   MAMMOGRAM  04/14/2019   pap smear  10/16/2011   SIGMOIDOSCOPY  11/02/2014   Per [facility] New Patient Packet   TONSILLECTOMY  06/12/1946   Per [facility] New Patient Packet.    Allergies  Allergen Reactions   Banana Swelling    Swelling around mouth and eyes and red blotches   Latex Swelling, Rash and Other (See Comments)   Ciprofloxacin Hcl Other (See Comments)    Severe knee inflammation    Gatifloxacin Other (See Comments)    Severe knee inflammation    Levofloxacin Other (See Comments)    Severe knee inflammation    Moxifloxacin Other (See Comments)    Severe knee inflammation    Norfloxacin Other (See Comments)    Severe knee inflammation    Nsaids Other (See Comments)    Upsets ulcers   Ofloxacin Other (See Comments)    Severe knee inflammation   Other Other (See Comments)    FLOXIN   Penicillins Hives    Tolerates (KEFLEX-cephalexin)    Methylisothiazolinone Rash and Other (See Comments)    A preservative found in a cream pt used   Tape Rash    Outpatient Encounter Medications as of 04/06/2021   Medication Sig   aspirin EC 81 MG tablet Take 81 mg by mouth daily with breakfast.   B Complex-C (B-COMPLEX WITH VITAMIN C) tablet Take 1 tablet by mouth daily.   Biotin 5000 MCG TABS Take 5,000 mcg by mouth daily.   CALCIUM PO Take 1,200 mg by mouth daily.    Cholecalciferol (VITAMIN D3) 50 MCG (2000 UT) TABS Take 2,000 Units by mouth in the morning, at noon, and at bedtime.   conjugated estrogens (PREMARIN) vaginal cream Place 1 Applicatorful vaginally 3 (three) times a week.   denosumab (PROLIA) 60 MG/ML SOSY injection Inject 60 mg into the skin every 6 (six) months.   gabapentin (NEURONTIN) 100 MG capsule Take 100 mg by mouth at bedtime.   Glucosamine HCl  1500 MG TABS Take 1,500 mg by mouth daily.   Loperamide-Simethicone 2-125 MG TABS Take 1 tablet by mouth 4 (four) times daily as needed (diarrhea/loose stools).   methenamine (HIPREX) 1 g tablet Take 1 g by mouth 2 (two) times daily with a meal. For UTI Prevention. Last UTI: October 20th, November 5th, and December 28th.   Multiple Vitamin (MULTIVITAMIN WITH MINERALS) TABS tablet Take 1 tablet by mouth daily. One A Day for Women   Omeprazole-Sodium Bicarbonate (ZEGERID) 20-1100 MG CAPS capsule Take 1 capsule by mouth 2 (two) times daily.   Polyethyl Glycol-Propyl Glycol (SYSTANE) 0.4-0.3 % SOLN Place 1 drop into both eyes as needed (dry/irritated eyes).   Probiotic Product (PROBIOTIC COLON SUPPORT PO) Take 1 tablet by mouth in the morning and at bedtime.   valACYclovir (VALTREX) 500 MG tablet TAKE (1) TABLET DAILY AS NEEDED.   No facility-administered encounter medications on file as of 04/06/2021.    Review of Systems  Constitutional:  Negative for appetite change, chills, fatigue, fever and unexpected weight change.  HENT:  Positive for postnasal drip, rhinorrhea, sinus pressure and sinus pain. Negative for congestion, dental problem, ear discharge, ear pain, facial swelling, hearing loss, nosebleeds, sneezing, sore throat, tinnitus  and trouble swallowing.   Eyes:  Negative for pain, discharge, redness, itching and visual disturbance.  Respiratory:  Positive for cough. Negative for chest tightness, shortness of breath and wheezing.   Cardiovascular:  Negative for chest pain, palpitations and leg swelling.  Gastrointestinal:  Negative for abdominal distention, abdominal pain, constipation, diarrhea, nausea and vomiting.  Musculoskeletal:  Negative for arthralgias, back pain, gait problem, joint swelling, myalgias, neck pain and neck stiffness.  Skin:  Negative for color change, pallor and rash.  Neurological:  Negative for dizziness, speech difficulty, weakness, light-headedness, numbness and headaches.   Immunization History  Administered Date(s) Administered   Influenza, High Dose Seasonal PF 03/26/2019   Moderna Sars-Covid-2 Vaccination 06/16/2019, 07/14/2019, 04/26/2020   Pneumococcal Conjugate-13 12/12/2014   Pneumococcal Polysaccharide-23 11/20/2019   Tdap 04/08/2008, 11/20/2019   Zoster Recombinat (Shingrix) 06/12/2017   Pertinent  Health Maintenance Due  Topic Date Due   INFLUENZA VACCINE  01/10/2021   DEXA SCAN  Completed   Fall Risk 08/31/2020 09/22/2020 12/22/2020 02/08/2021 02/17/2021  Falls in the past year? - 0 0 0 0  Was there an injury with Fall? - - 0 0 0  Fall Risk Category Calculator - - 0 0 0  Fall Risk Category - - Low Low Low  Patient Fall Risk Level Low fall risk - Low fall risk Low fall risk Low fall risk  Patient at Risk for Falls Due to - - No Fall Risks No Fall Risks No Fall Risks  Fall risk Follow up - - Falls evaluation completed;Education provided;Falls prevention discussed Falls evaluation completed Falls evaluation completed   Functional Status Survey:    Vitals:   04/06/21 1019  BP: (!) 150/90  Resp: 16  Temp: 98.2 F (36.8 C)  Weight: 138 lb 3.2 oz (62.7 kg)  Height: 5\' 2"  (1.575 m)   Body mass index is 25.28 kg/m. Physical Exam Vitals reviewed.  Constitutional:       General: She is not in acute distress.    Appearance: Normal appearance. She is normal weight. She is not ill-appearing or diaphoretic.  HENT:     Head: Normocephalic.     Right Ear: Tympanic membrane, ear canal and external ear normal. There is no impacted cerumen.     Left Ear:  Tympanic membrane and external ear normal. There is no impacted cerumen.     Ears:     Comments: Left ear canal slightly red TM clear     Nose: Nose normal. No congestion or rhinorrhea.     Mouth/Throat:     Mouth: Mucous membranes are moist.     Pharynx: Posterior oropharyngeal erythema present. No oropharyngeal exudate.  Eyes:     General: No scleral icterus.       Right eye: No discharge.        Left eye: No discharge.     Extraocular Movements: Extraocular movements intact.     Conjunctiva/sclera: Conjunctivae normal.     Pupils: Pupils are equal, round, and reactive to light.  Neck:     Vascular: No carotid bruit.  Cardiovascular:     Rate and Rhythm: Normal rate and regular rhythm.     Pulses: Normal pulses.     Heart sounds: Normal heart sounds. No murmur heard.   No friction rub. No gallop.  Pulmonary:     Effort: Pulmonary effort is normal. No respiratory distress.     Breath sounds: Normal breath sounds. No wheezing, rhonchi or rales.  Chest:     Chest wall: No tenderness.  Musculoskeletal:        General: No swelling or tenderness. Normal range of motion.     Cervical back: Normal range of motion. No rigidity or tenderness.     Right lower leg: No edema.     Left lower leg: No edema.  Lymphadenopathy:     Cervical: No cervical adenopathy.  Skin:    General: Skin is warm and dry.     Coloration: Skin is not pale.     Findings: No erythema or rash.  Neurological:     Mental Status: She is alert and oriented to person, place, and time.     Motor: No weakness.     Gait: Gait normal.  Psychiatric:        Mood and Affect: Mood normal.        Speech: Speech normal.        Behavior:  Behavior normal.        Thought Content: Thought content normal.        Judgment: Judgment normal.    Labs reviewed: Recent Labs    08/21/20 0416 08/21/20 1259 08/22/20 0415 08/23/20 0158 08/31/20 0346 09/20/20 0815  NA 131*   < > 135 136 138 141  K 4.3  --  4.2 4.2 3.8 3.8  CL 94*  --  98 101 103 104  CO2 29  --  30 27 28 29   GLUCOSE 180*  --  89 110* 109* 101*  BUN 20  --  15 12 11 7   CREATININE 0.62  --  0.68 0.64 0.59 0.66  CALCIUM 8.7*  --  8.3* 8.7* 8.8* 9.0  MG 1.7  --  1.7 1.9  --   --    < > = values in this interval not displayed.   Recent Labs    08/31/20 0346 09/20/20 0815  AST 24 17  ALT 19 12  ALKPHOS 96  --   BILITOT 0.6 0.4  PROT 5.9* 6.2  ALBUMIN 3.4*  --    Recent Labs    08/23/20 0158 08/31/20 0346 09/20/20 0815  WBC 19.5* 8.4 5.9  NEUTROABS  --  5.7 4,024  HGB 8.6* 9.2* 11.3*  HCT 26.2* 28.1* 35.4  MCV 100.8* 102.6* 101.7*  PLT  489* 498* 378   Lab Results  Component Value Date   TSH 2.04 09/20/2020   No results found for: HGBA1C Lab Results  Component Value Date   CHOL 228 (H) 08/29/2019   HDL 93 08/29/2019   LDLCALC 112 (H) 08/29/2019   TRIG 115 08/29/2019   CHOLHDL 2.5 08/29/2019    Significant Diagnostic Results in last 30 days:  DG Abd 2 Views  Result Date: 03/24/2021 CLINICAL DATA:  Constipation. Patient reports left upper quadrant pain. EXAM: ABDOMEN - 2 VIEW COMPARISON:  Abdominopelvic CT 05/07/2019 FINDINGS: No free intra-abdominal air. No bowel dilatation to suggest obstruction. No air-fluid levels are seen. Moderate stool in the right colon, with small volume of stool distally. No abnormal rectal distention. Left pelvic calcification corresponds to phleboliths on prior CT. No radiopaque calculi. Stents of lumbosacral spinal fusion hardware. No acute osseous abnormalities are seen. IMPRESSION: 1. Normal bowel gas pattern. No free air. 2. Moderate stool in the ascending colon, with small volume of stool distally. No  abnormal rectal distention. Electronically Signed   By: Keith Rake M.D.   On: 03/24/2021 12:27   DG Abd 2 Views  Result Date: 03/16/2021 CLINICAL DATA:  Constipation EXAM: ABDOMEN - 2 VIEW COMPARISON:  08/12/2020 FINDINGS: Extensive lumbar spine hardware. No free air beneath the diaphragm. Mild air-filled small bowel up to 3.2 cm with fluid levels. Moderate stool in the colon IMPRESSION: Mild air-filled small bowel with few fluid levels in the left upper quadrant but with distal gas present, findings could be secondary to ileus or enteritis. Moderate stool burden. Radiographic follow-up may be performed if symptoms are suggestive of developing obstruction. Electronically Signed   By: Donavan Foil M.D.   On: 03/16/2021 19:07    Assessment/Plan   Acute pharyngitis, unspecified etiology Afebrile  Erythema noted.bilateral tonsillar tender to palpation  Will start on doxycycline  - doxycycline (VIBRA-TABS) 100 MG tablet; Take 1 tablet (100 mg total) by mouth 2 (two) times daily for 7 days.  Dispense: 14 tablet; Refill: 0  Family/ staff Communication: Reviewed plan of care with patient verbalized understanding   Labs/tests ordered: None   Next Appointment: As needed if symptoms worsen or fail to improve    Sandrea Hughs, NP

## 2021-04-06 NOTE — Patient Instructions (Signed)
-   Notify provider if symptoms worsen or fail to improve  °

## 2021-04-15 ENCOUNTER — Ambulatory Visit
Admission: RE | Admit: 2021-04-15 | Discharge: 2021-04-15 | Disposition: A | Discharge status: Home / Self Care | Payer: Medicare Other | Source: Ambulatory Visit | Attending: Family Medicine | Admitting: Family Medicine

## 2021-04-15 ENCOUNTER — Other Ambulatory Visit: Payer: Self-pay

## 2021-04-15 DIAGNOSIS — Z1231 Encounter for screening mammogram for malignant neoplasm of breast: Secondary | ICD-10-CM | POA: Diagnosis not present

## 2021-04-21 ENCOUNTER — Other Ambulatory Visit: Payer: Self-pay | Admitting: Family Medicine

## 2021-04-21 DIAGNOSIS — R928 Other abnormal and inconclusive findings on diagnostic imaging of breast: Secondary | ICD-10-CM

## 2021-04-26 ENCOUNTER — Other Ambulatory Visit: Payer: Self-pay | Admitting: Family

## 2021-04-29 ENCOUNTER — Other Ambulatory Visit: Payer: Self-pay | Admitting: Family

## 2021-05-13 ENCOUNTER — Ambulatory Visit: Payer: Medicare Other

## 2021-05-17 ENCOUNTER — Ambulatory Visit
Admission: RE | Admit: 2021-05-17 | Discharge: 2021-05-17 | Disposition: A | Discharge status: Home / Self Care | Payer: Medicare Other | Source: Ambulatory Visit | Attending: Family Medicine | Admitting: Family Medicine

## 2021-05-17 ENCOUNTER — Other Ambulatory Visit: Payer: Self-pay

## 2021-05-17 ENCOUNTER — Ambulatory Visit: Payer: Medicare Other

## 2021-05-17 DIAGNOSIS — R928 Other abnormal and inconclusive findings on diagnostic imaging of breast: Secondary | ICD-10-CM

## 2021-05-18 ENCOUNTER — Ambulatory Visit (INDEPENDENT_AMBULATORY_CARE_PROVIDER_SITE_OTHER): Payer: Medicare Other | Admitting: Family Medicine

## 2021-05-18 ENCOUNTER — Ambulatory Visit: Payer: Medicare Other | Admitting: Internal Medicine

## 2021-05-18 ENCOUNTER — Other Ambulatory Visit: Payer: Self-pay

## 2021-05-18 ENCOUNTER — Encounter: Payer: Self-pay | Admitting: Family Medicine

## 2021-05-18 VITALS — BP 142/76 | HR 93 | Ht 62.0 in | Wt 138.6 lb

## 2021-05-18 DIAGNOSIS — E871 Hypo-osmolality and hyponatremia: Secondary | ICD-10-CM | POA: Diagnosis not present

## 2021-05-18 DIAGNOSIS — M5440 Lumbago with sciatica, unspecified side: Secondary | ICD-10-CM

## 2021-05-18 DIAGNOSIS — K580 Irritable bowel syndrome with diarrhea: Secondary | ICD-10-CM

## 2021-05-18 DIAGNOSIS — D500 Iron deficiency anemia secondary to blood loss (chronic): Secondary | ICD-10-CM

## 2021-05-18 DIAGNOSIS — M810 Age-related osteoporosis without current pathological fracture: Secondary | ICD-10-CM | POA: Diagnosis not present

## 2021-05-18 MED ORDER — DENOSUMAB 60 MG/ML ~~LOC~~ SOSY
60.0000 mg | PREFILLED_SYRINGE | Freq: Once | SUBCUTANEOUS | Status: AC
Start: 2021-05-18 — End: 2021-05-18
  Administered 2021-05-18: 60 mg via SUBCUTANEOUS

## 2021-05-18 NOTE — Progress Notes (Signed)
Provider:  Alain Honey, MD  Careteam: Patient Care Team: Wardell Honour, MD as PCP - General (Family Medicine) Rozetta Nunnery, MD as Consulting Physician (Otolaryngology) Luberta Mutter, MD as Consulting Physician (Ophthalmology)  PLACE OF SERVICE:  [place]  Advanced Directive information    Allergies  Allergen Reactions   Banana Swelling    Swelling around mouth and eyes and red blotches   Latex Swelling, Rash and Other (See Comments)   Ciprofloxacin Hcl Other (See Comments)    Severe knee inflammation    Gatifloxacin Other (See Comments)    Severe knee inflammation    Levofloxacin Other (See Comments)    Severe knee inflammation    Moxifloxacin Other (See Comments)    Severe knee inflammation    Norfloxacin Other (See Comments)    Severe knee inflammation    Nsaids Other (See Comments)    Upsets ulcers   Ofloxacin Other (See Comments)    Severe knee inflammation   Other Other (See Comments)    FLOXIN   Penicillins Hives    Tolerates (KEFLEX-cephalexin)    Methylisothiazolinone Rash and Other (See Comments)    A preservative found in a cream pt used   Tape Rash    Chief Complaint  Patient presents with   Medical Management of Chronic Issues    Patient presents today for 4 month follow-up and prolia injection.   Quality Metric Gaps    Hep C screening, zoster, Flu, COVID booster     HPI: Patient is a 80 y.o. female .  Patient is here for 63-monthfollow-up.  She is doing well.  Had extensive lumbar fusion back in March and feels like she is about back to normal after that.  She is here today for her Prolia shot as well.  Her T-scores were -2.2. She still has some GERD and is gone back to [place] She has started a gluten-free diet as she was having some diarrhea.  Also has a history of IBS with alternating constipation and diarrhea but since going on the gluten-free diet the diarrhea has improved.  Review of Systems:  Review of  Systems  Constitutional: Negative.   HENT: Negative.    Respiratory: Negative.    Cardiovascular: Negative.   Gastrointestinal:  Positive for diarrhea.  Skin: Negative.   Neurological: Negative.   Psychiatric/Behavioral: Negative.    All other systems reviewed and are negative.  Past Medical History:  Diagnosis Date   Barrett's esophagus    Colitis    DDD (degenerative disc disease)    Erroneous encounter - disregard    error    Erroneous encounter - disregard    Erroneous encounter - disregard    Factor 5 Leiden mutation, heterozygous ([facility]    [place] [place]   Frequent UTI    H/O echocardiogram 04/01/2019   [place]   Hiatal hernia    [place]   High grade dysplasia of Barrett's epithelium    [facility]   [facility] of bladder infections    [facility]   History of bone density study 07/12/2018   [place]   History of bone density study 07/12/2018   Up coming [facility]17[facility]2019, [facility]   History of bone density study 07/17/2019   History of endoscopy 12/17/2015   Needed Every 3 years. Scheduled for January 2021. [facility]  History of mammogram 04/14/2019   Per [place] Patient Packet.   History of MRI 03/01/2019   By Dr.Gulati/ Neurologist. MRI of Brain. Per [facility] New Patient Packet.   History of MRI 04/23/2019   By Dr. Maryan Rued at [facility]. Per [facility] New Patient Packet.   History of Papanicolaou smear of cervix 10/16/2011   Per [place] Patient Packet   Hypotension    Per [facility] New Patient Packet.   IBS (irritable bowel syndrome)    Per [place] New Patient Packet.   Lactose intolerance    Osteoarthritis    Per [place] New Patient Packet.   Osteopenia    Per [place] New Patient Packet.   Osteoporosis    Premature ventricular complex    Raynaud's disease    Per [place] Patient Packet.   Scoliosis    Per [facility] New Patient  Packet.   Past Surgical History:  Procedure Laterality Date   ABDOMINAL EXPOSURE N/A 08/12/2020   Procedure: ABDOMINAL EXPOSURE;  Surgeon: Serafina Mitchell, MD;  Location: [facility] OR;  Service: Vascular;  Laterality: N/A;   ABDOMINAL HYSTERECTOMY  06/13/2011   By Dr.Scherer at [facility]. Per [facility] New Patient Packet.   ANTERIOR LAT LUMBAR FUSION Right 08/12/2020   Procedure: Right Lumbar two-three, Lumbar three-four, Lumbar four-five Anterolateral lumbar interbody fusion;  Surgeon: Erline Levine, MD;  Location: [place];  Service: Neurosurgery;  Laterality: Right;   ANTERIOR LUMBAR FUSION N/A 08/12/2020   Procedure: Lumbar five Sacral one Anterior lumbar interbody fusion;  Surgeon: Erline Levine, MD;  Location: [place];  Service: Neurosurgery;  Laterality: N/A;   BREAST EXCISIONAL BIOPSY Left 30+ yrs ago   COLONOSCOPY  11/23/2011   By Dr.McCune at [facility]. Per [place] Patient Packet.   COLONOSCOPY  08/26/2019   LUMBAR PERCUTANEOUS PEDICLE SCREW 4 LEVEL N/A 08/12/2020   Procedure: Percutaneous pedicle screw fixation from Lumbar two to Sacral one ;  Surgeon: Erline Levine, MD;  Location: [place];  Service: Neurosurgery;  Laterality: N/A;   MAMMOGRAM  04/14/2019   pap smear  10/16/2011   SIGMOIDOSCOPY  11/02/2014   Per [place] New Patient Packet   TONSILLECTOMY  06/12/1948   Per [facility] New Patient Packet.   Social History:   reports that she has quit smoking. Her smoking use included cigarettes. She started smoking about 43 years ago. She has never used smokeless tobacco. She reports that she does not currently use alcohol after a past usage of about 1.0 standard drink per week. She reports that she does not use drugs.  Family History  Problem Relation Age of Onset   Cancer Mother    Cancer Father     Medications: Patient's Medications  New Prescriptions   No medications on file  Previous Medications   ASPIRIN EC 81 MG TABLET    Take 81 mg by mouth daily with breakfast.   B  COMPLEX-C (B-COMPLEX WITH VITAMIN C) TABLET    Take 1 tablet by mouth daily.   BIOTIN 5000 MCG TABS    Take 5,000 mcg by mouth daily.   CALCIUM PO    Take 1,200 mg by mouth daily.    CHOLECALCIFEROL (VITAMIN D3) 50 MCG (2000 UT) TABS    Take 2,000 Units by mouth in the morning, at noon, and at bedtime.   CONJUGATED ESTROGENS (PREMARIN) VAGINAL CREAM    Place 1 Applicatorful vaginally 3 (three) times a week.   DENOSUMAB (PROLIA) 60 MG/ML SOSY INJECTION    Inject  60 mg into the skin every 6 (six) months.   GABAPENTIN (NEURONTIN) 100 MG CAPSULE    TAKE ONE CAPSULE BY MOUTH THREE TIMES DAILY (FOLLOW-UP WITH DR. FOR FURTHER REFILLS)   GLUCOSAMINE HCL 1500 MG TABS    Take 1,500 mg by mouth daily.   LOPERAMIDE-SIMETHICONE 2-125 MG TABS    Take 1 tablet by mouth 4 (four) times daily as needed (diarrhea/loose stools).   METHENAMINE (HIPREX) 1 G TABLET    Take 1 g by mouth 2 (two) times daily with a meal. For UTI Prevention. Last UTI: October 20th, November 5th, and December 28th.   MULTIPLE VITAMIN (MULTIVITAMIN WITH MINERALS) TABS TABLET    Take 1 tablet by mouth daily. One A Day for Women   OMEPRAZOLE-SODIUM BICARBONATE (ZEGERID) 20-1100 MG CAPS CAPSULE    Take 1 capsule by mouth 2 (two) times daily.   POLYETHYL GLYCOL-PROPYL GLYCOL (SYSTANE) 0.4-0.3 % SOLN    Place 1 drop into both eyes as needed (dry/irritated eyes).   PROBIOTIC PRODUCT (PROBIOTIC COLON SUPPORT PO)    Take 1 tablet by mouth in the morning and at bedtime.   VALACYCLOVIR (VALTREX) 500 MG TABLET    TAKE (1) TABLET DAILY AS NEEDED.  Modified Medications   No medications on file  Discontinued Medications   No medications on file    Physical Exam:  Vitals:   05/18/21 1058  BP: (!) 142/76  Pulse: 93  SpO2: 95%  Weight: 138 lb 9.6 oz (62.9 kg)  Height: _0  (1.575 m)   Body mass index is 25.35 kg/m. Wt Readings from Last 3 Encounters:  05/18/21 138 lb 9.6 oz (62.9 kg)  04/06/21 138 lb 3.2 oz (62.7 kg)  02/08/21 135 lb 3.2 oz  (61.3 kg)    Physical Exam Vitals and nursing note reviewed.  Constitutional:      Appearance: Normal appearance.  HENT:     Head: Normocephalic.  Cardiovascular:     Rate and Rhythm: Normal rate and regular rhythm.     Pulses: Normal pulses.  Pulmonary:     Effort: Pulmonary effort is normal.     Breath sounds: Normal breath sounds.  Abdominal:     General: Abdomen is flat. Bowel sounds are normal.     Palpations: Abdomen is soft.  Neurological:     General: No focal deficit present.     Mental Status: She is alert and oriented to person, place, and time.  Psychiatric:        Mood and Affect: Mood normal.    Labs reviewed: Basic Metabolic Panel: Recent Labs    08/17/20 1543 08/17/20 1804 08/21/20 0416 08/21/20 1259 08/22/20 0415 08/23/20 0158 08/31/20 0346 09/20/20 0815  NA 115*   < > 131*   < > 135 136 138 141  K 4.7   < > 4.3  --  4.2 4.2 3.8 3.8  CL 83*   < > 94*  --  98 101 103 104  CO2 25   < > 29  --  _1 GLUCOSE 103*   < > 180*  --  89 110* 109* 101*  BUN 8   < > 20  --  _2 CREATININE 0.46   < > 0.62  --  0.68 0.64 0.59 0.66  CALCIUM 7.5*   < > 8.7*  --  8.3* 8.7* 8.8* 9.0  MG  --    < > 1.7  --  1.7 1.9  --   --  TSH 1.837  --   --   --   --   --   --  2.04   < > = values in this interval not displayed.   Liver Function Tests: Recent Labs    08/31/20 0346 09/20/20 0815  AST 24 17  ALT 19 12  ALKPHOS 96  --   BILITOT 0.6 0.4  PROT 5.9* 6.2  ALBUMIN 3.4*  --    No results for input(s): LIPASE, AMYLASE in the last 8760 hours. No results for input(s): AMMONIA in the last 8760 hours. CBC: Recent Labs    08/23/20 0158 08/31/20 0346 09/20/20 0815  WBC 19.5* 8.4 5.9  NEUTROABS  --  5.7 4,024  HGB 8.6* 9.2* 11.3*  HCT 26.2* 28.1* 35.4  MCV 100.8* 102.6* 101.7*  PLT 489* 498* 378   Lipid Panel: No results for input(s): CHOL, HDL, LDLCALC, TRIG, CHOLHDL, LDLDIRECT in the last 8760 hours. TSH: Recent Labs     08/17/20 1543 09/20/20 0815  TSH 1.837 2.04   A1C: No results found for: HGBA1C   Assessment/Plan  Age-related osteoporosis without current pathological fracture - Plan: denosumab (PROLIA) injection 60 mg  Hyponatremia - Plan: BMP with eGFR([facility])  Blood loss anemia - Plan: CBC (no diff)  Bilateral low back pain with sciatica, sciatica laterality unspecified, unspecified chronicity  Irritable bowel syndrome with diarrhea   Alain Honey, MD [facility] [phone]

## 2021-05-19 LAB — BASIC METABOLIC PANEL WITH GFR
BUN: 17 mg/dL (ref 7–25)
CO2: 26 mmol/L (ref 20–32)
Calcium: 9.6 mg/dL (ref 8.6–10.4)
Chloride: 101 mmol/L (ref 98–110)
Creat: 0.87 mg/dL (ref 0.60–1.00)
Glucose, Bld: 76 mg/dL (ref 65–99)
Potassium: 4.5 mmol/L (ref 3.5–5.3)
Sodium: 140 mmol/L (ref 135–146)
eGFR: 68 mL/min/{1.73_m2} (ref >=60)

## 2021-05-19 LAB — CBC
HCT: 38.9 % (ref 35.0–45.0)
Hemoglobin: 12.7 g/dL (ref 11.7–15.5)
MCH: 32.2 pg (ref 27.0–33.0)
MCHC: 32.6 g/dL (ref 32.0–36.0)
MCV: 98.7 fL (ref 80.0–100.0)
MPV: 10.5 fL (ref 7.5–12.5)
Platelets: 291 10*3/uL (ref 140–400)
RBC: 3.94 10*6/uL (ref 3.80–5.10)
RDW: 12 % (ref 11.0–15.0)
WBC: 6.8 10*3/uL (ref 3.8–10.8)

## 2021-06-20 ENCOUNTER — Other Ambulatory Visit: Payer: Self-pay | Admitting: Gastroenterology

## 2021-06-20 DIAGNOSIS — K2270 Barrett's esophagus without dysplasia: Secondary | ICD-10-CM | POA: Diagnosis not present

## 2021-06-20 DIAGNOSIS — Z86010 Personal history of colonic polyps: Secondary | ICD-10-CM | POA: Diagnosis not present

## 2021-06-20 DIAGNOSIS — K529 Noninfective gastroenteritis and colitis, unspecified: Secondary | ICD-10-CM | POA: Diagnosis not present

## 2021-06-20 DIAGNOSIS — R1032 Left lower quadrant pain: Secondary | ICD-10-CM | POA: Diagnosis not present

## 2021-06-20 DIAGNOSIS — K52832 Lymphocytic colitis: Secondary | ICD-10-CM | POA: Diagnosis not present

## 2021-06-22 ENCOUNTER — Telehealth: Payer: Self-pay | Admitting: *Deleted

## 2021-06-22 NOTE — Telephone Encounter (Signed)
Carol Perez with pharmacy called and left message on Clinical intake voicemail stating that patient was having an allergic reaction to a medication prescribed. Stated that patient was having back pain and soreness and stated to call the patient.    I called the patient and she stated that they were not to call our office, that she was suppose to call Dr. Alessandra Bevels with [facility].  Patient stated that she will contact their office with the concerns.

## 2021-06-28 ENCOUNTER — Telehealth: Payer: Self-pay | Admitting: Neurology

## 2021-06-28 ENCOUNTER — Telehealth (INDEPENDENT_AMBULATORY_CARE_PROVIDER_SITE_OTHER): Payer: Medicare Other | Admitting: Neurology

## 2021-06-28 DIAGNOSIS — R4701 Aphasia: Secondary | ICD-10-CM | POA: Diagnosis not present

## 2021-06-28 DIAGNOSIS — R404 Transient alteration of awareness: Secondary | ICD-10-CM

## 2021-06-28 DIAGNOSIS — Z818 Family history of other mental and behavioral disorders: Secondary | ICD-10-CM

## 2021-06-28 DIAGNOSIS — Z9189 Other specified personal risk factors, not elsewhere classified: Secondary | ICD-10-CM

## 2021-06-28 DIAGNOSIS — R4189 Other symptoms and signs involving cognitive functions and awareness: Secondary | ICD-10-CM | POA: Diagnosis not present

## 2021-06-28 NOTE — Progress Notes (Addendum)
[facility]    Provider:  Dr Jaynee Eagles Requesting Provider: Wardell Honour, MD Primary Care Provider:  Wardell Honour, MD  CC:  memory loss, new chief complaint  Virtual Visit via Telephone Note  I connected with Carol Perez on 06/28/21 at  9:30 AM EST by telephone and verified that I am speaking with the correct person using two identifiers.  Location: Patient: home Provider: office   I discussed the limitations, risks, security and privacy concerns of performing an evaluation and management service by telephone and the availability of in person appointments. I also discussed with the patient that there may be a patient responsible charge related to this service. The patient expressed understanding and agreed to proceed.   Follow Up Instructions:    I discussed the assessment and treatment plan with the patient. The patient was provided an opportunity to ask questions and all were answered. The patient agreed with the plan and demonstrated an understanding of the instructions.   The patient was advised to call back or seek an in-person evaluation if the symptoms worsen or if the condition fails to improve as anticipated.  I provided 45 minutes of non-face-to-face time during this encounter.   Melvenia Beam, MD   She has major low back surgery in 2022 and she was under anesthesia for a long time. She feels her memory is impaired since, she can't remember names, she can't remember if she took her pills. She hs alzheimer's and strokes in her family. She is perfoeming hr own IADLs and ADLs. Her husband has to check your bills. She drives fine. No accidents in the home, people are noticing hr husband. She is having word-finding difficulty.   HPI:  Carol Perez is a 81 y.o. female here as requested by Wardell Honour, MD for head pain. She woke up one morning with pain in the right scalp. Impulses. They got severe that evening, she couldn't sleep, she tried  CBD and melatonin, shooting pain behind the ear, not burning, she took Gabapentin (she had some for her low back pain and sciatica). She has seen a neurosurgeon and he was worried about her neck. She has a lot of neck pain. The pain is gone. The symptoms are better and she has not had it for a few days. Her low back is better. But she has a lot of neck problems, she has seen a neurosurgeon in the past. She exercises at least 5 morning a week, she exercises her neck. She has tightness in her neck. She has very tight neck muscles.   Reviewed notes, labs and imaging from outside physicians, which showed:  Personally reviewed images of MRI of the brain and MRI trigeminal protocol, essentially normal for age, some very minimal white matter changes, no vascular compression.  Bmp,cbc unremarkable  Reviewed notes from emergency room visit where she was seen for right temporal sharp pain which does not radiate she reported 7 out of 10 pain which was sudden and lasting for 2 days waxing and waning no worsening to bright light coughing or straining, no other associated symptoms, examination and neurologic examination were unremarkable and nonfocal.  She did not have tenderness in the area, her pains could not be provoked, there were no rashes or focal deficits on exam, normal gait, they were concerned for trigeminal neuralgia.  She was given gabapentin 3 times daily.  Review of Systems: Patient complains of symptoms per HPI as well as the following symptoms: memory  loss . Pertinent negatives and positives per HPI. All others negative    Social History   Socioeconomic History   Marital status: Married    Spouse name: Not on file   Number of children: 2   Years of education: Not on file   Highest education level: Associate degree: occupational, Hotel manager, or vocational program  Occupational History   Occupation: Retired   Tobacco Use   Smoking status: Former    Types: Cigarettes    Start date:  06/12/1977   Smokeless tobacco: Never   Tobacco comments:    42 years ago. Smoked from age [age]  Vaping Use   Vaping Use: Never used  Substance and Sexual Activity   Alcohol use: Not Currently    Alcohol/week: 1.0 standard drink    Types: 1 Shots of liquor per week    Comment: one gin and tonic at night   Drug use: No   Sexual activity: Not on file  Other Topics Concern   Not on file  Social History Narrative   Tobacco use, amount per day now: No   Past tobacco use, amount per day: Smoked ages [age]   How many years did you use tobacco: 17-20 years   Alcohol use (drinks per week): 1 glass of Red Wine per day.   Diet: Good   Do you drink/eat things with caffeine: No   Marital status: Married                                  What year were you married? 1978   Do you live in a house, apartment, assisted living, condo, trailer, etc.? Apartment/Independent Living   Is it one or more stories? 1   How many persons live in your home? 2    Do you have pets in your home?( please list) No   Highest Level of Education completed? 2 years of college.    Current or past profession: Retired Futures trader   Do you exercise?  Yes                                Type and how often? 57min workout 5 days week. Walking 3-4 Days a Week.    Do you have a living will? Yes   Do you have a DNR form?  Yes                                 If not, do you want to discuss one?   Do you have signed POA/HPOA forms? Yes                       If so, please bring to you appointment      Do you have difficulty bathing or dressing yourself? No   Do you have difficulty preparing food or eating? No   Do you have difficulty managing your medications? No   Do you have difficulty managing your finances? No   Do you have difficulty affording your medications? No       Per [facility] New Patient Packet. Abstracted by Jasmine/RMA.    Social Determinants of Health   Financial Resource Strain: Not on file  Food Insecurity:  Not on file  Transportation Needs: Not on file  Physical Activity: Not  on file  Stress: Not on file  Social Connections: Not on file  Intimate Partner Violence: Not on file    Family History  Problem Relation Age of Onset   Cancer Mother    Cancer Father     Past Medical History:  Diagnosis Date   Barrett's esophagus    Colitis    DDD (degenerative disc disease)    Erroneous encounter - disregard    error    Erroneous encounter - disregard    Erroneous encounter - disregard    Factor 5 Leiden mutation, heterozygous [facility])    Per [facility] New Patient Packet.   Frequent UTI    H/O echocardiogram 04/01/2019   Per [place] Patient Packet.   Hiatal hernia    Per [place] New Patient Packet.   High grade dysplasia of Barrett's epithelium    Per [facility] New Patient Packet.   History of bladder infections    Per [facility] New Patient Packet.   History of bone density study 07/12/2018   Per [place] Patient Packet.   History of bone density study 07/12/2018   Up coming appointment 07/17/2019, Per [facility] New Patient Packet   History of bone density study 07/17/2019   History of endoscopy 12/17/2015   Needed Every 3 years. Scheduled for January 2021. Per [facility] New Patient Packet.    History of mammogram 04/14/2019   Per [place] Patient Packet.   History of MRI 03/01/2019   By Dr.Gulati/ Neurologist. MRI of Brain. Per [facility] New Patient Packet.   History of MRI 04/23/2019   By Dr. Maryan Rued at [facility]. Per [facility] New Patient Packet.   History of Papanicolaou smear of cervix 10/16/2011   Per [facility] New Patient Packet   Hypotension    Per [facility] New Patient Packet.   IBS (irritable bowel syndrome)    Per [place] New Patient Packet.   Lactose intolerance    Osteoarthritis    Per [place] New Patient Packet.   Osteopenia    Per [place] New Patient Packet.   Osteoporosis    Premature ventricular complex    Raynaud's disease    Per [place] Patient Packet.   Scoliosis    Per [facility] New Patient Packet.    Patient  Active Problem List   Diagnosis Date Noted   Depression, recurrent ([place]) 08/24/2020   Atrophic vaginitis 08/24/2020   Blood loss anemia 08/24/2020   Hyponatremia    Lumbar scoliosis 08/12/2020   Occipital neuralgia of right side 09/08/2019   Recurrent UTI 06/26/2019   Osteoporosis 06/26/2019   IBS (irritable bowel syndrome) 06/26/2019   Hyperlipidemia 06/26/2019   Back pain 06/26/2019   Barrett's esophagus 06/26/2019    Past Surgical History:  Procedure Laterality Date   ABDOMINAL EXPOSURE N/A 08/12/2020   Procedure: ABDOMINAL EXPOSURE;  Surgeon: Serafina Mitchell, MD;  Location: [place];  Service: Vascular;  Laterality: N/A;   ABDOMINAL HYSTERECTOMY  06/13/2011   By Dr.Scherer at [facility]. Per [facility] New Patient Packet.   ANTERIOR LAT LUMBAR FUSION Right 08/12/2020   Procedure: Right Lumbar two-three, Lumbar three-four, Lumbar four-five Anterolateral lumbar interbody fusion;  Surgeon: Erline Levine, MD;  Location: [place];  Service: Neurosurgery;  Laterality: Right;   ANTERIOR LUMBAR FUSION N/A 08/12/2020   Procedure: Lumbar five Sacral one Anterior lumbar interbody fusion;  Surgeon: Erline Levine, MD;  Location: [place];  Service: Neurosurgery;  Laterality: N/A;   BREAST EXCISIONAL BIOPSY Left 30+ yrs ago   COLONOSCOPY  11/23/2011  By Dr.McCune at [facility]. Per [place] Patient Packet.   COLONOSCOPY  08/26/2019   LUMBAR PERCUTANEOUS PEDICLE SCREW 4 LEVEL N/A 08/12/2020   Procedure: Percutaneous pedicle screw fixation from Lumbar two to Sacral one ;  Surgeon: Erline Levine, MD;  Location: [place];  Service: Neurosurgery;  Laterality: N/A;   MAMMOGRAM  04/14/2019   pap smear  10/16/2011   SIGMOIDOSCOPY  11/02/2014   Per [place] New Patient Packet   TONSILLECTOMY  06/[birth year]/1948   Per [facility] New Patient Packet.    Current Outpatient Medications  Medication Sig Dispense Refill   aspirin EC 81 MG tablet Take 81 mg by mouth daily with breakfast.     B Complex-C (B-COMPLEX WITH  VITAMIN C) tablet Take 1 tablet by mouth daily.     Biotin 5000 MCG TABS Take 5,000 mcg by mouth daily.     CALCIUM PO Take 1,200 mg by mouth daily.      Cholecalciferol (VITAMIN D3) 50 MCG (2000 UT) TABS Take 2,000 Units by mouth in the morning, at noon, and at bedtime.     conjugated estrogens (PREMARIN) vaginal cream Place 1 Applicatorful vaginally 3 (three) times a week. 42.5 g 12   denosumab (PROLIA) 60 MG/ML SOSY injection Inject 60 mg into the skin every 6 (six) months.     Glucosamine HCl 1500 MG TABS Take 1,500 mg by mouth daily.     Loperamide-Simethicone 2-125 MG TABS Take 1 tablet by mouth 4 (four) times daily as needed (diarrhea/loose stools).     methenamine (HIPREX) 1 g tablet Take 1 g by mouth 2 (two) times daily with a meal. For UTI Prevention. Last UTI: October 20th, November 5th, and December 28th.     Multiple Vitamin (MULTIVITAMIN WITH MINERALS) TABS tablet Take 1 tablet by mouth daily. One A Day for Women     Omeprazole-Sodium Bicarbonate (ZEGERID) 20-1100 MG CAPS capsule Take 1 capsule by mouth 2 (two) times daily.     Polyethyl Glycol-Propyl Glycol (SYSTANE) 0.4-0.3 % SOLN Place 1 drop into both eyes as needed (dry/irritated eyes).     Probiotic Product (PROBIOTIC COLON SUPPORT PO) Take 1 tablet by mouth in the morning and at bedtime.     valACYclovir (VALTREX) 500 MG tablet TAKE (1) TABLET DAILY AS NEEDED. 90 tablet 3   No current facility-administered medications for this visit.    Allergies as of 06/28/2021 - Review Complete 05/18/2021  Allergen Reaction Noted   Banana Swelling 09/08/2019   Latex Swelling, Rash, and Other (See Comments) 01/14/2011   Ciprofloxacin hcl Other (See Comments) 08/03/2013   Gatifloxacin Other (See Comments) 08/03/2013   Levofloxacin Other (See Comments) 08/03/2013   Moxifloxacin Other (See Comments) 08/03/2013   Norfloxacin Other (See Comments) 08/03/2013   Nsaids Other (See Comments) 01/14/2011   Ofloxacin Other (See Comments)  08/03/2013   Other Other (See Comments) 09/08/2019   Penicillins Hives 01/14/2011   Methylisothiazolinone Rash and Other (See Comments) 04/12/2017   Tape Rash 04/12/2017    Vitals: There were no vitals taken for this visit. Last Weight:  Wt Readings from Last 1 Encounters:  05/18/21 138 lb 9.6 oz (62.9 kg)   Last Height:   Ht Readings from Last 1 Encounters:  05/18/21 5\' 2"  (1.575 m)    Physical exam: Exam: Gen: NAD, conversant      CV:  Could not perform over Web Video. Denies palpitations or chest pain or SOB. VS: Breathing at a normal rate. Weight appears within normal limits. Not  febrile. Eyes: Conjunctivae clear without exudates or hemorrhage  Neuro: Detailed Neurologic Exam  Speech:    Speech is normal; fluent and spontaneous with normal comprehension.  Cognition:    The patient is oriented to person, place, and time;     recent and remote memory intact;     language fluent;     normal attention, concentration,     fund of knowledge Cranial Nerves:    The pupils are equal, round, and reactive to light.  Cannot perform fundoscopic exam. Visual fields are full to finger confrontation. Extraocular movements are intact.  The face is symmetric with normal sensation. The palate elevates in the midline. Hearing intact. Voice is normal. Shoulder shrug is normal. The tongue has normal motion without fasciculations.   Coordination:    Normal finger to nose  Gait:    Normal native gait  Motor Observation:   no involuntary movements noted. Tone:    Appears normal  Posture:    Posture is normal. normal erect    Strength:    Strength is anti-gravity and symmetric in the upper and lower limbs.      Sensation: intact to LT     Reflex Exam:    Assessment/Plan: This is a really lovely 81 year old who woke up with scalp pain, sharp, in the occipital area on the right.  By report she has significant arthritic changes in the neck, I think this is likely occipital nerve  irritation, the symptoms have resolved, we did talk about her neck pain, at this time nothing concerning to order MRI of the cervical spine, exam is nonfocal, she has no radiating symptoms into the arms and her occipital neuralgia has resolved.   Today she is discussing with me some memory issues that she is having, this is a new issue we have not discussed, has been ongoing since she was under anesthesia, she is having difficulty remembering names, cannot member if she takes her pills, she is still performing her own IADLs and ADLs but husband has stepped back check her bills, she has a family history on both sides of Alzheimer's, at this time given her age and symptoms that think it would be a bad idea to start a work-up including an MRI of the brain for strokes which could have happened during anesthesia, she will come in for an EEG and blood work, and I will also send her to formal memory testing so we have a good baseline.   Orders Placed This Encounter  Procedures   MR BRAIN W WO CONTRAST   B12 and Folate Panel   Methylmalonic acid, serum   Vitamin B1   Homocysteine   TSH   Basic Metabolic Panel   Ambulatory referral to Neuropsychology   EEG adult   No orders of the defined types were placed in this encounter.   Cc: Wardell Honour, MD,  Wardell Honour, MD  Sarina Ill, MD  [facility] [address] [place], [place] [address]  Phone [phone] Fax [phone]

## 2021-06-28 NOTE — Telephone Encounter (Signed)
Referral sent to [facility] Neuro[facility] Dr. Nicole Kindred. Phone [phone].

## 2021-06-28 NOTE — Telephone Encounter (Signed)
[employer] medicare order sent to [facility], [facility] they will reach out to the patient to schedule.  

## 2021-06-29 ENCOUNTER — Encounter: Payer: Self-pay | Admitting: *Deleted

## 2021-06-29 DIAGNOSIS — H52203 Unspecified astigmatism, bilateral: Secondary | ICD-10-CM | POA: Diagnosis not present

## 2021-06-29 DIAGNOSIS — Z961 Presence of intraocular lens: Secondary | ICD-10-CM | POA: Diagnosis not present

## 2021-06-29 DIAGNOSIS — H04123 Dry eye syndrome of bilateral lacrimal glands: Secondary | ICD-10-CM | POA: Diagnosis not present

## 2021-06-30 DIAGNOSIS — N3021 Other chronic cystitis with hematuria: Secondary | ICD-10-CM | POA: Diagnosis not present

## 2021-07-04 ENCOUNTER — Other Ambulatory Visit: Payer: Self-pay

## 2021-07-04 ENCOUNTER — Ambulatory Visit
Admission: RE | Admit: 2021-07-04 | Discharge: 2021-07-04 | Disposition: A | Discharge status: Home / Self Care | Payer: Medicare Other | Source: Ambulatory Visit | Attending: Gastroenterology | Admitting: Gastroenterology

## 2021-07-04 DIAGNOSIS — K668 Other specified disorders of peritoneum: Secondary | ICD-10-CM | POA: Diagnosis not present

## 2021-07-04 DIAGNOSIS — N281 Cyst of kidney, acquired: Secondary | ICD-10-CM | POA: Diagnosis not present

## 2021-07-04 DIAGNOSIS — R1032 Left lower quadrant pain: Secondary | ICD-10-CM

## 2021-07-04 DIAGNOSIS — K5730 Diverticulosis of large intestine without perforation or abscess without bleeding: Secondary | ICD-10-CM | POA: Diagnosis not present

## 2021-07-04 DIAGNOSIS — K449 Diaphragmatic hernia without obstruction or gangrene: Secondary | ICD-10-CM | POA: Diagnosis not present

## 2021-07-04 MED ORDER — IOPAMIDOL (ISOVUE-370) INJECTION 76%
80.0000 mL | Freq: Once | INTRAVENOUS | Status: AC | PRN
  Administered 2021-07-04: 80 mL via INTRAVENOUS

## 2021-07-07 ENCOUNTER — Other Ambulatory Visit (INDEPENDENT_AMBULATORY_CARE_PROVIDER_SITE_OTHER): Payer: Self-pay

## 2021-07-07 ENCOUNTER — Ambulatory Visit: Payer: Medicare Other | Admitting: Neurology

## 2021-07-07 DIAGNOSIS — R4701 Aphasia: Secondary | ICD-10-CM

## 2021-07-07 DIAGNOSIS — R404 Transient alteration of awareness: Secondary | ICD-10-CM

## 2021-07-07 DIAGNOSIS — Z9189 Other specified personal risk factors, not elsewhere classified: Secondary | ICD-10-CM

## 2021-07-07 DIAGNOSIS — R410 Disorientation, unspecified: Secondary | ICD-10-CM

## 2021-07-07 DIAGNOSIS — Z0289 Encounter for other administrative examinations: Secondary | ICD-10-CM

## 2021-07-07 DIAGNOSIS — R4189 Other symptoms and signs involving cognitive functions and awareness: Secondary | ICD-10-CM

## 2021-07-08 NOTE — Procedures (Signed)
° ° °  History:  81 yo woman with memory problems   EEG classification: Awake and drowsy  Description of the recording: The background rhythms of this recording consists of a fairly well modulated medium amplitude alpha rhythm of 9-10 Hz that is reactive to eye opening and closure. As the record progresses, the patient appears to remain in the waking state throughout the recording. Photic stimulation was performed, did not show any abnormalities. Hyperventilation was also performed, did not show any abnormalities. Toward the end of the recording, the patient enters the drowsy state with slight symmetric slowing seen. The patient never enters stage II sleep. No abnormal epileptiform discharges seen during this recording. There was no focal slowing. EKG monitor shows no evidence of cardiac rhythm abnormalities with a heart rate of 60.  Impression: This is a normal EEG recording in the waking and drowsy state. No evidence of interictal epileptiform discharges seen. A normal EEG does not exclude a diagnosis of epilepsy.    Alric Ran, MD [facility]

## 2021-07-10 LAB — BASIC METABOLIC PANEL
BUN/Creatinine Ratio: 14 (ref 12–28)
BUN: 11 mg/dL (ref 8–27)
CO2: 25 mmol/L (ref 20–29)
Calcium: 9.2 mg/dL (ref 8.7–10.3)
Chloride: 101 mmol/L (ref 96–106)
Creatinine, Ser: 0.8 mg/dL (ref 0.57–1.00)
Glucose: 101 mg/dL — ABNORMAL HIGH (ref 70–99)
Potassium: 4.4 mmol/L (ref 3.5–5.2)
Sodium: 141 mmol/L (ref 134–144)
eGFR: 75 mL/min/{1.73_m2} (ref >59)

## 2021-07-10 LAB — VITAMIN B1: Thiamine: 168.2 nmol/L (ref 66.5–200.0)

## 2021-07-10 LAB — B12 AND FOLATE PANEL
Folate: >20 ng/mL (ref >3.0)
Vitamin B-12: >2000 pg/mL — ABNORMAL HIGH (ref 232–1245)

## 2021-07-10 LAB — HOMOCYSTEINE: Homocysteine: 9.2 umol/L (ref 0.0–19.2)

## 2021-07-10 LAB — TSH: TSH: 2.49 u[IU]/mL (ref 0.450–4.500)

## 2021-07-10 LAB — METHYLMALONIC ACID, SERUM: Methylmalonic Acid: 131 nmol/L (ref 0–378)

## 2021-07-12 ENCOUNTER — Ambulatory Visit
Admission: RE | Admit: 2021-07-12 | Discharge: 2021-07-12 | Disposition: A | Discharge status: Home / Self Care | Payer: Medicare Other | Source: Ambulatory Visit | Attending: Neurology | Admitting: Neurology

## 2021-07-12 DIAGNOSIS — Z9189 Other specified personal risk factors, not elsewhere classified: Secondary | ICD-10-CM

## 2021-07-12 DIAGNOSIS — R4701 Aphasia: Secondary | ICD-10-CM

## 2021-07-12 DIAGNOSIS — R404 Transient alteration of awareness: Secondary | ICD-10-CM

## 2021-07-12 DIAGNOSIS — R4189 Other symptoms and signs involving cognitive functions and awareness: Secondary | ICD-10-CM

## 2021-07-12 MED ORDER — GADOBENATE DIMEGLUMINE 529 MG/ML IV SOLN
13.0000 mL | Freq: Once | INTRAVENOUS | Status: AC | PRN
  Administered 2021-07-12: 13 mL via INTRAVENOUS

## 2021-07-21 DIAGNOSIS — G3184 Mild cognitive impairment, so stated: Secondary | ICD-10-CM | POA: Diagnosis not present

## 2021-07-26 DIAGNOSIS — R109 Unspecified abdominal pain: Secondary | ICD-10-CM | POA: Diagnosis not present

## 2021-08-03 DIAGNOSIS — G3184 Mild cognitive impairment, so stated: Secondary | ICD-10-CM | POA: Insufficient documentation

## 2021-08-03 DIAGNOSIS — R4189 Other symptoms and signs involving cognitive functions and awareness: Secondary | ICD-10-CM | POA: Insufficient documentation

## 2021-08-05 ENCOUNTER — Telehealth: Payer: Self-pay | Admitting: Neurology

## 2021-08-05 NOTE — Telephone Encounter (Signed)
Pt is asking to be called for an appointment to come in when all results for recently ran test are available.

## 2021-08-08 NOTE — Telephone Encounter (Signed)
Spoke with pt and got her scheduled for Monday September 12, 2021 at 9:30 AM arrival 9:00 AM. Pt's questions were answered and she verbalized appreciation for the call.

## 2021-09-12 ENCOUNTER — Ambulatory Visit: Payer: Medicare Other | Admitting: Neurology

## 2021-09-12 ENCOUNTER — Encounter: Payer: Self-pay | Admitting: Neurology

## 2021-09-12 ENCOUNTER — Telehealth: Payer: Self-pay | Admitting: *Deleted

## 2021-09-12 VITALS — BP 134/73 | HR 74 | Ht 62.0 in | Wt 143.4 lb

## 2021-09-12 DIAGNOSIS — R030 Elevated blood-pressure reading, without diagnosis of hypertension: Secondary | ICD-10-CM | POA: Diagnosis not present

## 2021-09-12 DIAGNOSIS — R413 Other amnesia: Secondary | ICD-10-CM

## 2021-09-12 DIAGNOSIS — M4120 Other idiopathic scoliosis, site unspecified: Secondary | ICD-10-CM | POA: Diagnosis not present

## 2021-09-12 DIAGNOSIS — G309 Alzheimer's disease, unspecified: Secondary | ICD-10-CM | POA: Diagnosis not present

## 2021-09-12 DIAGNOSIS — Z818 Family history of other mental and behavioral disorders: Secondary | ICD-10-CM | POA: Diagnosis not present

## 2021-09-12 DIAGNOSIS — G3184 Mild cognitive impairment, so stated: Secondary | ICD-10-CM | POA: Diagnosis not present

## 2021-09-12 MED ORDER — DONEPEZIL HCL 5 MG PO TABS
5.0000 mg | ORAL_TABLET | Freq: Every day | ORAL | 12 refills | Status: DC
Start: 2021-09-12 — End: 2021-11-23

## 2021-09-12 NOTE — Telephone Encounter (Signed)
LMVM for Carol Perez about process for testing and f/u appts for neuropsych eval.   ?

## 2021-09-12 NOTE — Telephone Encounter (Signed)
-----   Message from Melvenia Beam, MD sent at 09/12/2021 10:52 AM EDT ----- ?Patient came in to day after receiving report from Dr. Vikki Ports and wanted me his testing to review it in detail. They did not have a follow up after results of test, they just received a report. I thought this is something Dr. Vikki Ports is supposed to do after he completes his report. I cannot review every detail in the report, he is the testing nd interpreting physician and I usually see thm back after he meets with them to five results.  can you ask if he is supposed to meet with patients after his report is complete and if usually does this?  ? ?dr. Vikki Ports at [phone]  ? ?

## 2021-09-12 NOTE — Telephone Encounter (Signed)
Called and LMVM for [phone] re: f/u on initial visit.  ?

## 2021-09-12 NOTE — Progress Notes (Signed)
?[facility] ? ? ? ?Provider:  Dr Jaynee Eagles ?Requesting Provider: Wardell Honour, MD ?Primary Care Provider:  Wardell Honour, MD ? ?CC:  memory loss, new chief complaint ? ?09/12/2021: Here for followup after formal memory testing completed.  Memory testing was largely inconclusive, I reviewed Dr. McDermott's notes with patient, but patient never followed back up with Dr. Vikki Ports so that he could review all the testing with her which I highly recommend and asked them to call the office.  She was diagnosed with mild cognitive impairment amnestic type, she has a long family history of Alzheimer's, could be prodromal Alzheimer's at this time unclear.  She will need to undergo a repeat likely in a year if her symptoms worsen, in the meantime given the uncertainty of her memory loss I am going to recommend an FDG PET scan which is highly sensitive and specific to Alzheimer's versus frontotemporal dementia.  No suspicion for vascular dementia given her MRI of the brain.  Anesthesia and normal cognitive aging could be contributory, but although she was diagnosed with MCI: "Test results revealed intact functioning across most cognitive domains and thinking skills assessed during this evaluation with the exception of exceptionally low performance on certain aspects of memory (i.e. new learning and free recall of contextual information and visually presented material), but with intact verbal free recall and recognition. Other intact areas included executive functions, attention and processing speed, language, and visuospatial judgment. From an emotional standpoint, on self-report measures, there appears to be at least a mild degree of recent depression. ? ?Ms. Bilski's performance evidences mild neurocognitive challenges primarily manifesting as difficulties with certain aspects of learning and memory. At present, an exact etiology is unclear, though we have to entertain the possibility of a prodromal  neurodegenerative process but nothing has been substantiated of yet. It is possible that the surgery and subsequent complications could be playing a role (or at least contributing) but further follow-up and monitoring is recommended ? ?REFERRING DIAGNOSIS:  ?Memory changes  ? ?FINAL DIAGNOSIS (ICD-10 considerations):  ?Mild Neurocognitive Disorder (i.e., mild cognitive impairment), amnestic (etiology unclear)" ? ? ?06/28/2021: She has major low back surgery in 2022 and she was under anesthesia for a long time. She feels her memory is impaired since, she can't remember names, she can't remember if she took her pills. She hs alzheimer's and strokes in her family. She is perfoeming hr own IADLs and ADLs. Her husband has to check your bills. She drives fine. No accidents in the home, people are noticing hr husband. She is having word-finding difficulty.  ? ?MRI brain 06/2021: IMPRESSION: personally reviewed and agree with findings (addition 10 minutes to appointment) ?  ?MRI brain (with and without) demonstrating: ?- Stable, mild periventricular and subcortical foci of nonspecific T2 hyperintensities.  No abnormal lesions are seen on post contrast views.   ?- No acute findings. ? ?Labs 07/07/2021: B12 folate b1, mma, homocysteine, tsh unremarkable ?  ?Patient complains of symptoms per HPI as well as the following symptoms: MCI . Pertinent negatives and positives per HPI. All others negative ?  ? ?HPI:  CHARLY HUNTON is a 81 y.o. female here as requested by Wardell Honour, MD for head pain. She woke up one morning with pain in the right scalp. Impulses. They got severe that evening, she couldn't sleep, she tried CBD and melatonin, shooting pain behind the ear, not burning, she took Gabapentin (she had some for her low back pain and sciatica). She has seen  a neurosurgeon and he was worried about her neck. She has a lot of neck pain. The pain is gone. The symptoms are better and she has not had it for a few days. Her low  back is better. But she has a lot of neck problems, she has seen a neurosurgeon in the past. She exercises at least 5 morning a week, she exercises her neck. She has tightness in her neck. She has very tight neck muscles.  ? ?Reviewed notes, labs and imaging from outside physicians, which showed: ? ?Personally reviewed images of MRI of the brain and MRI trigeminal protocol, essentially normal for age, some very minimal white matter changes, no vascular compression. ? ?Bmp,cbc unremarkable ? ?Reviewed notes from emergency room visit where she was seen for right temporal sharp pain which does not radiate she reported 7 out of 10 pain which was sudden and lasting for 2 days waxing and waning no worsening to bright light coughing or straining, no other associated symptoms, examination and neurologic examination were unremarkable and nonfocal.  She did not have tenderness in the area, her pains could not be provoked, there were no rashes or focal deficits on exam, normal gait, they were concerned for trigeminal neuralgia.  She was given gabapentin 3 times daily. ? ?Review of Systems: ?Patient complains of symptoms per HPI as well as the following symptoms: memory loss . Pertinent negatives and positives per HPI. All others negative ? ? ? ?Social History  ? ?Socioeconomic History  ? Marital status: Married  ?  Spouse name: Not on file  ? Number of children: 2  ? Years of education: Not on file  ? Highest education level: Associate degree: occupational, Hotel manager, or vocational program  ?Occupational History  ? Occupation: Retired   ?Tobacco Use  ? Smoking status: Former  ?  Types: Cigarettes  ?  Start date: 06/12/1977  ? Smokeless tobacco: Never  ? Tobacco comments:  ?  42 years ago. Smoked from age [age]  ?Vaping Use  ? Vaping Use: Never used  ?Substance and Sexual Activity  ? Alcohol use: Not Currently  ?  Alcohol/week: 7.0 standard drinks  ?  Types: 7 Shots of liquor per week  ?  Comment: one gin and tonic at night  ?  Drug use: No  ? Sexual activity: Not on file  ?Other Topics Concern  ? Not on file  ?Social History Narrative  ? Tobacco use, amount per day now: No  ? Past tobacco use, amount per day: Smoked ages [age]  ? How many years did you use tobacco: 17-20 years  ? Alcohol use (drinks per week): 1 glass of Red Wine per day.  ? Diet: Good  ? Do you drink/eat things with caffeine: No  ? Marital status: Married                                  What year were you married? 1978  ? Do you live in a house, apartment, assisted living, condo, trailer, etc.? Apartment/Independent Living  ? Is it one or more stories? 1  ? How many persons live in your home? 2   ? Do you have pets in your home?( please list) No  ? Highest Level of Education completed? 2 years of college.   ? Current or past profession: Retired Futures trader  ? Do you exercise?  Yes  Type and how often? 56mn workout 5 days week. Walking 3-4 Days a Week.   ? Do you have a living will? Yes  ? Do you have a DNR form?  Yes                                 If not, do you want to discuss one?  ? Do you have signed POA/HPOA forms? Yes                       If so, please bring to you appointment  ?   ? Do you have difficulty bathing or dressing yourself? No  ? Do you have difficulty preparing food or eating? No  ? Do you have difficulty managing your medications? No  ? Do you have difficulty managing your finances? No  ? Do you have difficulty affording your medications? No   ?   ? [facility]. Abstracted by Jasmine/RMA.   ? ?Social Determinants of Health  ? ?Financial Resource Strain: Not on file  ?Food Insecurity: Not on file  ?Transportation Needs: Not on file  ?Physical Activity: Not on file  ?Stress: Not on file  ?Social Connections: Not on file  ?Intimate Partner Violence: Not on file  ? ? ?Family History  ?Problem Relation Age of Onset  ? Cancer Mother   ? Dementia Mother   ? Cancer Father   ? Dementia Maternal Grandmother    ? ? ?Past Medical History:  ?Diagnosis Date  ? Barrett's esophagus   ? Colitis   ? DDD (degenerative disc disease)   ? Erroneous encounter - disregard   ? error   ? Erroneous encounter - disregard   ? EWynn Maudlin

## 2021-09-12 NOTE — Patient Instructions (Addendum)
- FDG PET SCAN to check for Alzheimer's pathology ?- Need to meet with Dr. Vikki Ports to thoroughly review his evaluation - please call him for follow up (If you have any questions,  please contact us at [phone] or feel free to send [place] a message via the patient portal ([email or web address]). Also see information on counseling stated below at  the [facility] on the [facility] ?- Start Donepezil/Aricept - if no side effects, mychart me and go up to '10mg'$  ?- We will follow up in one year or sooner based on results of PET scan ? ?SUMMARY & IMPRESSION: Carol Perez is a 81 year old, right-handed, White female, who reported experiencing mild cognitive challenges that began within the past 2 years and were exacerbated approximately 1 year ago by a major spinal surgery that took place (March 2022). She reported some complications following the surgery that prolonged her hospitalization to include hyponatremia and pneumonia. While she feels that she has improved to a degree, she has yet to return to baseline. ? ?Test results revealed intact functioning across most cognitive domains and thinking skills assessed during this evaluation with the exception of exceptionally low performance on certain aspects of memory (i.e. new learning and free recall of contextual information and visually presented material), but with intact verbal free recall and recognition. Other intact areas included executive functions, attention and processing speed, language, and visuospatial judgment. From an emotional standpoint, on self-report measures, there appears to be at least a mild degree of recent depression. ? ?Carol Perez's performance evidences mild neurocognitive challenges primarily manifesting as difficulties with certain aspects of learning and memory. At present, an exact etiology is unclear, though we have to entertain the possibility of a prodromal neurodegenerative process but nothing has been  substantiated of yet. It is possible that the surgery and subsequent complications could be playing a role (or at least contributing) but further follow-up and monitoring is recommended ? ?REFERRING DIAGNOSIS:  ?Memory changes  ? ?FINAL DIAGNOSIS (ICD-10 considerations):  ?Mild Neurocognitive Disorder (i.e., mild cognitive impairment), amnestic (etiology unclear) ? ?RECOMMENDATIONS: ?1. Follow-up with Dr. Jaynee Eagles.  ? Could consider treatment with cholinesterase inhibitor.  ? It is recommended that the patient aggressively manage any modifiable risk factors for further cognitive decline such as strict compliance with prescribed medical treatments for any cerebrovascular risk factors (e.g., high cholesterol, high blood pressure, sleep apnea, diabetes). ? Follow-up with the [facility] is recommended in 12 months for educational purposes, psychosocial concerns, and neurocognitive testing as necessary. If you wish to make this follow-up appointment, please contact our office at [phone]. ? ?2. In response to the growing number of families affected by memory loss and dementia, the [facility] Counsel[facility] Program (MCP) was established in 2011 with the support of the Section on Gerontology and Geriatric Medicine at [facility]. The MCP provides counseling services for individuals diagnosed with mild cognitive impairment (MCI), Alzheimer's disease, or another form of dementia, as well as to their family members. Services include individual, couple, and family counseling, as well as support groups, all of which provide a safe environment to talk about the journey with mild cognitive impairment or dementia, learn as much as possible about the disease, problem solve some of the common challenges encountered with memory and cognitive loss, and strengthen relationships. ? The MCP is staffed by licensed practitioners. All services are rendered  in a professional manner consistent with the ethical  standards of the American Counseling Association and the [employer]. ? The MCP is located in the [facility] on the [place]. In general, we see clients every few weeks or monthly, depending on the need. Sessions are typically 45-60 minutes long, scheduled at mutually agreed upon times. Referrals can be made by a health care professional or by self-referral.  ? Contact: [phone] ? ?3. The patient is encouraged to attend to lifestyle factors for brain health (e.g., regular physical exercise, good nutrition habits, regular participation in cognitively-stimulating activities, and general stress management techniques), which are likely to have benefits for both emotional adjustment and cognition. In fact, in addition to promoting general good health, regular exercise incorporating aerobic activities (e.g., brisk walking, jogging, bicycling, etc.) has been demonstrated to be a very effective treatment for depression and stress, with similar efficacy rates to both antidepressant medication and psychotherapy. And for those with orthopedic issues, water aerobics may be particularly beneficial. ? ?4. Nutritional factors can have a significant effect on psychological and emotional status, as well as overall brain functioning. The following general recommendations have been associated with improvements in depression and other psychological symptoms, as well as lower risk for dementia and other forms of cognitive impairment. Please discuss these recommendations with your physician and/or dietitian before initiating: ? ? Consume a wide variety of fresh fruits and vegetables, particularly including brightly colored items such as berries, oranges, tomatoes, peppers, carrots, broccoli, spinach, dark green lettuces, sweet potatoes, etc., all of which are high in vitamins and antioxidants. ? ? Consume foods that are  high in fiber, such as legumes (e.g., beans, peas, lentils) and foods made from whole grains (e.g., whole wheat bread and pasta) ? ? Consume a significant amount of omega-3 essential fats and oils. These can be found in natural food sources such as salmon and other fatty fish, and also products made from flax seed and flax seed oil. Alternately, dietary supplementation with fish oil capsules and flax seed oil capsules is a good way to boost one?s level of omega-3 consumption. It is important to check with your doctor before taking these supplements, especially if you take blood thinning medication. ? ? If you do not already do so, consider taking a quality multivitamin supplement under the guidance of your primary care physician.  ? ? Consider keeping consumption of the following foods to a minimum: 1) foods made from white flour and white sugar; 2) artificial sweeteners; 3) deep-fried foods; 4) animal fat other than fish; 5) any foods containing ?hydrogenated? or ?trans? fats; 6) most other types of highly processed packaged/prepared foods.  ? ?5. Try to keep in mind that common word finding errors are not necessarily the start of a dreadful decline. Over-focusing on these errors can contribute to further distraction and emotions that can detract from effective retrieval of words; this in turn, can lead to greater distress and more difficulties with recalling the specific word you were looking for to begin with.  ? ?The following are several strategies that may help:  ? Performance will generally be best in a structured, routine, and familiar environment, as opposed to situations involving complex problems.  ? Designate a place to keep your keys, wallet, cell phone, and other personal belongings. ? Take time to register and process information to be remembered. Deeper encoding of information can be gained by forming a mental picture, making meaningful associations, connecting new information to previously learned  and related information, paraphrasing  and repetition.  ? To the extent possible, multitasking should be avoided; break down tasks into smaller steps to help get started and to keep from feeling overwhelmed.

## 2021-09-13 ENCOUNTER — Telehealth: Payer: Self-pay | Admitting: Neurology

## 2021-09-13 NOTE — Telephone Encounter (Signed)
[employer] medicare pending faxed notes  

## 2021-09-13 NOTE — Telephone Encounter (Signed)
Carol Perez returned your phone call. He said nobody is on the phones and he will call back.  ?

## 2021-09-13 NOTE — Telephone Encounter (Signed)
Glendell Docker returned call and stated Carol Perez is out for this week so he is manning the phones between pt's so he will call RN back when he can.  ?

## 2021-09-13 NOTE — Telephone Encounter (Signed)
Glendell Docker called to discuss the patient.  He did not want to elaborate in the message.  ?Contact [phone] ?

## 2021-09-13 NOTE — Telephone Encounter (Signed)
I called LMVM that was returning call, sorry I missed will try later.   ?

## 2021-09-15 NOTE — Telephone Encounter (Signed)
I called Glendell Docker, who is checking phones for [place], and just need to know if Dr. Vikki Ports see's pts after he does testing to go over the results.  We had this pt who came here and asking for results.  May LM with phone staff.   ?

## 2021-09-15 NOTE — Telephone Encounter (Signed)
Spoke to [place],  he stated that they leave the feedback appt up to pt if they want to comeback after they are sent the results/ report of eval and testing done by Dr. Vikki Ports.  With Ms Mangal she thought that Dr. Jaynee Eagles would give her the results.  I relayed that we do not do that and we wanted to know what the their process was.  They will have Ms Youngberg back in for an appt for feedback results as pt did call them to set up.   ?

## 2021-09-19 NOTE — Telephone Encounter (Signed)
This is a Dr. Jaynee Eagles patient.  ? ?[employer] medicare is pending a denial- ? ?"Based on medicare guidelines PET for Dementia and Neurodegenerative diseases, we cannot approve this request. Your healthcare provider told us that there is a concern related to a problem that affects your recall (memory) this request cannot be approved because:  ?You must meet the criteria for Alzheimer's disease. ?Your must meet the criteria for Frontotemporal dementia. ? ?It must be need to differentiate between a diagnosis of frontotemporal dementia and Alzheimer's disease in order to help guide future treatment  ? ?If you would like to call and do a peer to peer the phone number is [phone] option 1 and the case number is [id number]. It would need be to be done by 09/20/21. ? ?If it does get denied they informed me it would have to be an appeal through the insurance plan.  ? ? ?

## 2021-09-19 NOTE — Telephone Encounter (Signed)
I had contacted Dr. Vikki Ports office has he had seen pt for testing (neuropsych eval)  and but not feedback appt.  Pt is to see them in the future for feedback as Dr. Jaynee Eagles was not able to go over results of this testing with pt.  ?

## 2021-09-21 ENCOUNTER — Telehealth: Payer: Self-pay | Admitting: Adult Health

## 2021-09-21 NOTE — Telephone Encounter (Signed)
Pt called wanting to leave a message for Ward Givens, states she could not figure out the [place].  ?Pt would like to know when she is supposed get SDG CATscan Pt states Ward Givens  ?was to to make her appt for her. ?Pt states donepezil (ARICEPT) 5 MG tablet ?is causing lack of sleep and terrible leg cramps. Would like a call back.  ?

## 2021-09-22 NOTE — Telephone Encounter (Addendum)
Spoke with patient.  She states she called [facility] this morning and was able to schedule an appointment with Dr. Vikki Ports for formal memory testing results review on April 18 at 10:15 AM.  Regarding the FDG PET scan, patient was made aware that insurance has denied this and she would need to complete the formal memory testing.  Patient states that Aricept did give her diarrhea initially but she has been taking Pepto-Bismol which helps.  She states she has IBS and colitis anyway, cannot eat green leafy vegetables nor raw fruit, and already takes Pepto-Bismol on an as-needed basis.  The patient states she has developed leg cramps at night.  She has tried mustard, blueberries, quinine none of which have not helped.  Oral magnesium had a laxative effect but she has tried taking that in the morning and also is trying a magnesium balm to rub on her legs at night which has helped.  She is going to see how she does over the weekend and then give [place] a call back on Monday.  We discussed the possibility of her discontinuing the medication (if advised by physician) if she isn't able to tolerate it. However the pt would like to try to tolerate as her mother was diagnosed with Alzheimer's previously and took Aricept and it helped her.  The patient also believes the Aricept has helped her memory personally so far. She informed me that she is doing a DNA test with 23 and me to see if she has the gene. ?

## 2021-09-22 NOTE — Telephone Encounter (Signed)
Called Carol Perez's office and LVM for Carol Perez asking for call back to follow-up on the need for pt to have a visit with Carol Vikki Ports for result review of the formal memory testing.  ?

## 2021-09-27 DIAGNOSIS — G3184 Mild cognitive impairment, so stated: Secondary | ICD-10-CM | POA: Diagnosis not present

## 2021-09-27 NOTE — Telephone Encounter (Signed)
Spoke with patient. She stopped the Aricept. Last night was the second night without it. She is still having the leg cramps but anticipates improvement with time. She scheduled a tentative follow-up with Dr Jaynee Eagles for 05/15/22. Plan to hopefully get PET scan after she sees Dr Vikki Ports in September. Would need to place new order however so reminder placed to address after September appt.  ?

## 2021-09-27 NOTE — Telephone Encounter (Signed)
Pt said, seen Dr. Hillery Hunter today, he recommended a sister medication to Aricept. He suggested wait to try the sister medication after I see Dr. Vikki Ports in September. Also wait after the appt with Dr. Linton Rump to schedule PET scan.  ?

## 2021-09-29 DIAGNOSIS — R9389 Abnormal findings on diagnostic imaging of other specified body structures: Secondary | ICD-10-CM | POA: Diagnosis not present

## 2021-09-29 DIAGNOSIS — R109 Unspecified abdominal pain: Secondary | ICD-10-CM | POA: Diagnosis not present

## 2021-09-29 DIAGNOSIS — K52832 Lymphocytic colitis: Secondary | ICD-10-CM | POA: Diagnosis not present

## 2021-09-29 DIAGNOSIS — K2270 Barrett's esophagus without dysplasia: Secondary | ICD-10-CM | POA: Diagnosis not present

## 2021-10-03 DIAGNOSIS — N3020 Other chronic cystitis without hematuria: Secondary | ICD-10-CM | POA: Diagnosis not present

## 2021-10-03 DIAGNOSIS — R300 Dysuria: Secondary | ICD-10-CM | POA: Diagnosis not present

## 2021-10-04 DIAGNOSIS — M25561 Pain in right knee: Secondary | ICD-10-CM | POA: Diagnosis not present

## 2021-10-04 DIAGNOSIS — M94261 Chondromalacia, right knee: Secondary | ICD-10-CM | POA: Insufficient documentation

## 2021-10-11 ENCOUNTER — Telehealth: Payer: Self-pay

## 2021-10-11 ENCOUNTER — Other Ambulatory Visit: Payer: Self-pay | Admitting: Family Medicine

## 2021-10-11 DIAGNOSIS — N644 Mastodynia: Secondary | ICD-10-CM

## 2021-10-11 NOTE — Telephone Encounter (Signed)
Patient called stating a mo ago she noticed a sore on her left breast nipple that wont go away, at times it itches and she has drawing feeling that go under arm. She would like it checked out. Last mammo was last year (dense breast) was normal. She called the breast center on [address] and was told she need a referral for a "Diagnostic xray".  ? ?To Dr. Sabra Heck.   ?

## 2021-10-16 ENCOUNTER — Encounter: Payer: Self-pay | Admitting: Neurology

## 2021-10-18 ENCOUNTER — Ambulatory Visit (INDEPENDENT_AMBULATORY_CARE_PROVIDER_SITE_OTHER): Payer: Medicare Other | Admitting: Family Medicine

## 2021-10-18 ENCOUNTER — Encounter: Payer: Self-pay | Admitting: Family Medicine

## 2021-10-18 VITALS — BP 128/76 | HR 76 | Temp 97.3°F

## 2021-10-18 DIAGNOSIS — F339 Major depressive disorder, recurrent, unspecified: Secondary | ICD-10-CM

## 2021-10-18 DIAGNOSIS — E785 Hyperlipidemia, unspecified: Secondary | ICD-10-CM

## 2021-10-18 MED ORDER — CITALOPRAM HYDROBROMIDE 10 MG PO TABS: 10.0000 mg | ORAL_TABLET | Freq: Every day | ORAL | 3 refills | Status: DC

## 2021-10-18 NOTE — Patient Instructions (Signed)
Let me know how you're doing with the ant-depressant ?

## 2021-10-18 NOTE — Progress Notes (Signed)
? ? ?Provider:  ?Alain Honey, MD ? ?Careteam: ?Patient Care Team: ?Wardell Honour, MD as PCP - General (Family Medicine) ?Rozetta Nunnery, MD (Inactive) as Consulting Physician (Otolaryngology) ?Luberta Mutter, MD as Consulting Physician (Ophthalmology) ? ?PLACE OF SERVICE:  ?[facility]  ?Advanced Directive information ?  ? ?Allergies  ?Allergen Reactions  ? Banana Swelling  ?  Swelling around mouth and eyes and red blotches  ? Latex Swelling, Rash and Other (See Comments)  ? Ciprofloxacin Hcl Other (See Comments)  ?  Severe knee inflammation ?  ? Gatifloxacin Other (See Comments)  ?  Severe knee inflammation ?  ? Levofloxacin Other (See Comments)  ?  Severe knee inflammation ?  ? Moxifloxacin Other (See Comments)  ?  Severe knee inflammation ?  ? Norfloxacin Other (See Comments)  ?  Severe knee inflammation ?  ? Nsaids Other (See Comments)  ?  Upsets ulcers  ? Ofloxacin Other (See Comments)  ?  Severe knee inflammation  ? Other Other (See Comments)  ?  FLOXIN  ? Penicillins Hives  ?  Tolerates (KEFLEX-cephalexin)   ? Methylisothiazolinone Rash and Other (See Comments)  ?  A preservative found in a cream pt used  ? Tape Rash  ? ? ?Chief Complaint  ?Patient presents with  ? Acute Visit  ?  Patient presents today for bump left breast nipple for 2 months now. She reports itching and pulling sensation. She reports placing Cortizone 10 on the area but no relieve.   ? ? ? ?HPI: Patient is a 81 y.o. female complains of bump on her left breast lateral to the nipple.  Had negative mammogram and ultrasound 3 months ago.  She has been using cortisone cream without success. ?Also complains of leg cramps and restless leg.  Also complains of issues with her memory.  She has been tested and PET scan is pending.  To talk to her during the visit there does not seem to be any cognitive decline but there is a strong family history and patient has concerns. ? ?Review of Systems:  ?Review of Systems  ?Constitutional:  Negative.   ?HENT: Negative.    ?Respiratory: Negative.    ?Cardiovascular: Negative.   ?Skin: Negative.   ?Psychiatric/Behavioral:  Positive for depression and memory loss.   ?All other systems reviewed and are negative. ? ?Past Medical History:  ?Diagnosis Date  ? Barrett's esophagus   ? Colitis   ? DDD (degenerative disc disease)   ? Erroneous encounter - disregard   ? error   ? Erroneous encounter - disregard   ? Erroneous encounter - disregard   ? Factor 5 Leiden mutation, heterozygous ([place])   ? Per [facility] New Patient Packet.  ? Frequent UTI   ? H/O echocardiogram 04/01/2019  ? Per [facility] New Patient Packet.  ? Hiatal hernia   ? Per [facility] New Patient Packet.  ? High grade dysplasia of Barrett's epithelium   ? Per [facility] New Patient Packet.  ? History of bladder infections   ? Per [facility] New Patient Packet.  ? History of bone density study 07/12/2018  ? Per [facility] New Patient Packet.  ? History of bone density study 07/12/2018  ? Up coming appointment 07/17/2019, Per [facility] New Patient Packet  ? History of bone density study 07/17/2019  ? History of endoscopy 12/17/2015  ? Needed Every 3 years. Scheduled for January 2021. Per [facility] New Patient Packet.   ? History of mammogram 04/14/2019  ? Per [facility] New Patient  Packet.  ? History of MRI 03/01/2019  ? By Dr.Gulati/ Neurologist. MRI of Brain. Per [facility] New Patient Packet.  ? History of MRI 04/23/2019  ? By Dr. Maryan Rued at [facility]. Per [facility] New Patient Packet.  ? History of Papanicolaou smear of cervix 10/16/2011  ? Per [facility] New Patient Packet  ? Hypoglycemia   ? Hypotension   ? Per [facility] New Patient Packet.  ? IBS (irritable bowel syndrome)   ? Per [facility] New Patient Packet.  ? Lactose intolerance   ? Osteoarthritis   ? Per [facility] New Patient Packet.  ? Osteopenia   ? Per [facility] New Patient Packet.  ? Osteoporosis   ? Premature ventricular complex   ? Raynaud's disease   ? Per [facility] New Patient Packet.  ? Scoliosis   ? Per [facility] New Patient Packet.  ? ?Past Surgical History:  ?Procedure  Laterality Date  ? ABDOMINAL EXPOSURE N/A 08/12/2020  ? Procedure: ABDOMINAL EXPOSURE;  Surgeon: Serafina Mitchell, MD;  Location: [facility] OR;  Service: Vascular;  Laterality: N/A;  ? ABDOMINAL HYSTERECTOMY  06/13/2011  ? By [facility] at [facility]. Per [facility] New Patient Packet.  ? ANTERIOR LAT LUMBAR FUSION Right 08/12/2020  ? Procedure: Right Lumbar two-three, Lumbar three-four, Lumbar four-five Anterolateral lumbar interbody fusion;  Surgeon: Erline Levine, MD;  Location: [place];  Service: Neurosurgery;  Laterality: Right;  ? ANTERIOR LUMBAR FUSION N/A 08/12/2020  ? Procedure: Lumbar five Sacral one Anterior lumbar interbody fusion;  Surgeon: Erline Levine, MD;  Location: [place];  Service: Neurosurgery;  Laterality: N/A;  ? BREAST EXCISIONAL BIOPSY Left 30+ yrs ago  ? COLONOSCOPY  11/23/2011  ? By Dr.McCune at [facility]. Per [place] Patient Packet.  ? COLONOSCOPY  08/26/2019  ? LUMBAR PERCUTANEOUS PEDICLE SCREW 4 LEVEL N/A 08/12/2020  ? Procedure: Percutaneous pedicle screw fixation from Lumbar two to Sacral one ;  Surgeon: Erline Levine, MD;  Location: [place];  Service: Neurosurgery;  Laterality: N/A;  ? MAMMOGRAM  04/14/2019  ? pap smear  10/16/2011  ? SIGMOIDOSCOPY  11/02/2014  ? Per [facility] New Patient Packet  ? TONSILLECTOMY  06/12/1946  ? Per [facility] New Patient Packet.  ? ?Social History: ?  reports that she has quit smoking. Her smoking use included cigarettes. She started smoking about 44 years ago. She has never used smokeless tobacco. She reports that she does not currently use alcohol after a past usage of about 7.0 standard drinks per week. She reports that she does not use drugs. ? ?Family History  ?Problem Relation Age of Onset  ? Cancer Mother   ? Dementia Mother   ? Cancer Father   ? Dementia Maternal Grandmother   ? ? ?Medications: ?Patient's Medications  ?New Prescriptions  ? No medications on file  ?Previous Medications  ? ASPIRIN EC 81 MG TABLET    Take 81 mg by mouth daily with breakfast.  ? B  COMPLEX-C (B-COMPLEX WITH VITAMIN C) TABLET    Take 1 tablet by mouth daily.  ? BIOTIN 5000 MCG TABS    Take 5,000 mcg by mouth daily.  ? CALCIUM PO    Take 1,200 mg by mouth daily.   ? CHOLECALCIFEROL (VITAMIN D3) 50 MCG (2000 UT) TABS    Take 2,000 Units by mouth in the morning, at noon, and at bedtime.  ? CONJUGATED ESTROGENS (PREMARIN) VAGINAL CREAM    Place 1 Applicatorful vaginally 3 (three) times a week.  ? DENOSUMAB (PROLIA) 60 MG/ML SOSY  INJECTION    Inject 60 mg into the skin every 6 (six) months.  ? DONEPEZIL (ARICEPT) 5 MG TABLET    Take 1 tablet (5 mg total) by mouth at bedtime.  ? GLUCOSAMINE HCL 1500 MG TABS    Take 1,500 mg by mouth daily.  ? LOPERAMIDE-SIMETHICONE 2-125 MG TABS    Take 1 tablet by mouth 4 (four) times daily as needed (diarrhea/loose stools).  ? METHENAMINE (HIPREX) 1 G TABLET    Take 1 g by mouth 2 (two) times daily with a meal. For UTI Prevention. Last UTI: October 20th, November 5th, and December 28th.  ? MULTIPLE VITAMIN (MULTIVITAMIN WITH MINERALS) TABS TABLET    Take 1 tablet by mouth daily. One A Day for Women  ? OMEPRAZOLE-SODIUM BICARBONATE (ZEGERID) 20-1100 MG CAPS CAPSULE    Take 1 capsule by mouth 2 (two) times daily.  ? POLYETHYL GLYCOL-PROPYL GLYCOL (SYSTANE) 0.4-0.3 % SOLN    Place 1 drop into both eyes as needed (dry/irritated eyes).  ? PROBIOTIC PRODUCT (PROBIOTIC COLON SUPPORT PO)    Take 1 tablet by mouth in the morning and at bedtime.  ? VALACYCLOVIR (VALTREX) 500 MG TABLET    TAKE (1) TABLET DAILY AS NEEDED.  ?Modified Medications  ? No medications on file  ?Discontinued Medications  ? No medications on file  ? ? ?Physical Exam: ? ?There were no vitals filed for this visit. ?There is no height or weight on file to calculate BMI. ?Wt Readings from Last 3 Encounters:  ?09/12/21 143 lb 6.4 oz (65 kg)  ?05/18/21 138 lb 9.6 oz (62.9 kg)  ?04/06/21 138 lb 3.2 oz (62.7 kg)  ? ? ?Physical Exam ?Vitals and nursing note reviewed.  ?Constitutional:   ?   Appearance: Normal  appearance.  ?Skin: ?   Comments: There is a small bump lateral to the left breast nipple. ?There is no scaling.  Is not open or draining.  There are no other lumps in the breast or in the axilla that are wor

## 2021-10-19 LAB — LIPID PANEL
Cholesterol: 234 mg/dL — ABNORMAL HIGH (ref <200)
HDL: 82 mg/dL (ref >=50)
LDL Cholesterol (Calc): 124 mg/dL (calc) — ABNORMAL HIGH
Non-HDL Cholesterol (Calc): 152 mg/dL (calc) — ABNORMAL HIGH (ref <130)
Total CHOL/HDL Ratio: 2.9 (calc) (ref <5.0)
Triglycerides: 163 mg/dL — ABNORMAL HIGH (ref <150)

## 2021-10-28 DIAGNOSIS — N3020 Other chronic cystitis without hematuria: Secondary | ICD-10-CM | POA: Diagnosis not present

## 2021-10-28 DIAGNOSIS — R300 Dysuria: Secondary | ICD-10-CM | POA: Diagnosis not present

## 2021-11-03 ENCOUNTER — Telehealth: Payer: Self-pay | Admitting: *Deleted

## 2021-11-03 NOTE — Telephone Encounter (Signed)
Prior Authorization for Prolia through [facility]:  [id number] [place], [place] [id number] Approved 11-03-2021 11-04-2022 [place], Alain Honey, [place]

## 2021-11-23 ENCOUNTER — Ambulatory Visit: Payer: Medicare Other | Admitting: Family Medicine

## 2021-11-23 ENCOUNTER — Encounter: Payer: Self-pay | Admitting: Family Medicine

## 2021-11-23 ENCOUNTER — Other Ambulatory Visit: Payer: Self-pay | Admitting: *Deleted

## 2021-11-23 VITALS — BP 126/72 | HR 71 | Temp 96.8°F | Ht 62.0 in | Wt 143.2 lb

## 2021-11-23 DIAGNOSIS — N3281 Overactive bladder: Secondary | ICD-10-CM | POA: Diagnosis not present

## 2021-11-23 DIAGNOSIS — G4762 Sleep related leg cramps: Secondary | ICD-10-CM

## 2021-11-23 DIAGNOSIS — M810 Age-related osteoporosis without current pathological fracture: Secondary | ICD-10-CM

## 2021-11-23 DIAGNOSIS — G2581 Restless legs syndrome: Secondary | ICD-10-CM | POA: Diagnosis not present

## 2021-11-23 MED ORDER — ROPINIROLE HCL 0.25 MG PO TABS: 0.2500 mg | ORAL_TABLET | Freq: Every day | ORAL | Status: DC

## 2021-11-23 MED ORDER — DENOSUMAB 60 MG/ML ~~LOC~~ SOSY
60.0000 mg | PREFILLED_SYRINGE | Freq: Once | SUBCUTANEOUS | Status: AC
  Administered 2021-11-23: 60 mg via SUBCUTANEOUS

## 2021-11-23 MED ORDER — ROPINIROLE HCL 0.25 MG PO TABS: 0.2500 mg | ORAL_TABLET | Freq: Every day | ORAL | 3 refills | Status: DC

## 2021-11-23 NOTE — Patient Instructions (Signed)
Pick up fibro, over-the-counter and apply to legs 30 to 60 minutes before bedtime

## 2021-11-23 NOTE — Progress Notes (Signed)
Provider:  Alain Honey, MD  Careteam: Patient Care Team: Wardell Honour, MD as PCP - General (Family Medicine) Rozetta Nunnery, MD (Inactive) as Consulting Physician (Otolaryngology) Luberta Mutter, MD as Consulting Physician (Ophthalmology)  PLACE OF SERVICE:  [place]  Advanced Directive information    Allergies  Allergen Reactions   Banana Swelling    Swelling around mouth and eyes and red blotches   Latex Swelling, Rash and Other (See Comments)   Ciprofloxacin Hcl Other (See Comments)    Severe knee inflammation    Gatifloxacin Other (See Comments)    Severe knee inflammation    Levofloxacin Other (See Comments)    Severe knee inflammation    Moxifloxacin Other (See Comments)    Severe knee inflammation    Norfloxacin Other (See Comments)    Severe knee inflammation    Nsaids Other (See Comments)    Upsets ulcers   Ofloxacin Other (See Comments)    Severe knee inflammation   Other Other (See Comments)    FLOXIN   Penicillins Hives    Tolerates (KEFLEX-cephalexin)    Methylisothiazolinone Rash and Other (See Comments)    A preservative found in a cream pt used   Tape Rash    Chief Complaint  Patient presents with   Acute Visit    Patient presents today for bilateral leg and feet cramps. She reports mainly at night time.     HPI: Patient is a 81 y.o. female who presents with nocturnal leg cramping.  This involves her lower leg, calfs.  Symptoms are often preceded by a desire to move the legs which sounds like restless leg syndrome.  She has tried several home remedies including tonic water without relief.  There is no history of anemia and she has had recent hemoglobin 4 months ago which was normal.  Review of Systems:  Review of Systems  Constitutional: Negative.   Musculoskeletal:  Positive for myalgias.  Psychiatric/Behavioral: Negative.    All other systems reviewed and are negative.   Past Medical History:  Diagnosis Date    Barrett's esophagus    Colitis    DDD (degenerative disc disease)    Erroneous encounter - disregard    error    Erroneous encounter - disregard    Erroneous encounter - disregard    Factor 5 Leiden mutation, heterozygous [facility])    Per [place] Patient Packet.   Frequent UTI    H/O echocardiogram 04/01/2019   Per [place] Patient Packet.   Hiatal hernia    Per [place] New Patient Packet.   High grade dysplasia of Barrett's epithelium    Per [facility] New Patient Packet.   History of bladder infections    Per [facility] New Patient Packet.   History of bone density study 07/12/2018   Per [place] Patient Packet.   History of bone density study 07/12/2018   Up coming appointment 07/17/2019, Per [facility] New Patient Packet   History of bone density study 07/17/2019   History of endoscopy 12/17/2015   Needed Every 3 years. Scheduled for January 2021. Per [facility] New Patient Packet.    History of mammogram 04/14/2019   Per [place] Patient Packet.   History of MRI 03/01/2019   By Dr.Gulati/ Neurologist. MRI of Brain. Per [facility] New Patient Packet.   History of MRI 04/23/2019   By [facility]an [facility]ued at [facility]. Per [facility] New Patient Packet.   History of Papanicolaou smear of cervix 10/16/2011   Per  [place] New Patient Packet   Hypoglycemia    Hypotension    Per [facility] New Patient Packet.   IBS (irritable bowel syndrome)    Per [place] New Patient Packet.   Lactose intolerance    Osteoarthritis    Per [place] New Patient Packet.   Osteopenia    Per [place] New Patient Packet.   Osteoporosis    Premature ventricular complex    Raynaud's disease    Per [place] Patient Packet.   Scoliosis    Per [facility] New Patient Packet.   Past Surgical History:  Procedure Laterality Date   ABDOMINAL EXPOSURE N/A 08/12/2020   Procedure: ABDOMINAL EXPOSURE;  Surgeon: Serafina Mitchell, MD;  Location: [facility] OR;  Service: Vascular;  Laterality: N/A;   ABDOMINAL HYSTERECTOMY  06/13/2011   By Dr[facility]Scherer at [facility]. Per [facility] New Patient  Packet.   ANTERIOR LAT LUMBAR FUSION Right 08/12/2020   Procedure: Right Lumbar two-three, Lumbar three-four, Lumbar four-five Anterolateral lumbar interbody fusion;  Surgeon: Erline Levine, MD;  Location: [place];  Service: Neurosurgery;  Laterality: Right;   ANTERIOR LUMBAR FUSION N/A 08/12/2020   Procedure: Lumbar five Sacral one Anterior lumbar interbody fusion;  Surgeon: Erline Levine, MD;  Location: [place];  Service: Neurosurgery;  Laterality: N/A;   BREAST EXCISIONAL BIOPSY Left 30+ yrs ago   COLONOSCOPY  11/23/2011   By Dr.McCune at [facility]. Per [place] Patient Packet.   COLONOSCOPY  08/26/2019   LUMBAR PERCUTANEOUS PEDICLE SCREW 4 LEVEL N/A 08/12/2020   Procedure: Percutaneous pedicle screw fixation from Lumbar two to Sacral one ;  Surgeon: Erline Levine, MD;  Location: [place];  Service: Neurosurgery;  Laterality: N/A;   MAMMOGRAM  04/14/2019   pap smear  10/16/2011   SIGMOIDOSCOPY  11/02/2014   Per [place] New Patient Packet   TONSILLECTOMY  06/[birth year]1948   Per [facility] New Patient Packet.   Social History:   reports that she has quit smoking. Her smoking use included cigarettes. She started smoking about 44 years ago. She has never used smokeless tobacco. She reports that she does not currently use alcohol after a past usage of about 7.0 standard drinks of alcohol per week. She reports that she does not use drugs.  Family History  Problem Relation Age of Onset   Cancer Mother    Dementia Mother    Alzheimer's disease Mother    Cancer Father    Alzheimer's disease Maternal Grandmother    Dementia Maternal Grandmother    Alzheimer's disease Maternal Grandfather     Medications: Patient's Medications  New Prescriptions   No medications on file  Previous Medications   ASPIRIN EC 81 MG TABLET    Take 81 mg by mouth daily with breakfast.   B COMPLEX-C (B-COMPLEX WITH VITAMIN C) TABLET    Take 1 tablet by mouth daily.   BILBERRY, VACCINIUM MYRTILLUS, (BILBERRY EXTRACT PO)     Take by mouth daily.   BIOTIN 5000 MCG TABS    Take 5,000 mcg by mouth daily.   BISMUTH SUBSALICYLATE (PEPTO-BISMOL PO)    Take by mouth as needed.   CALCIUM PO    Take 1,200 mg by mouth daily.    CHOLECALCIFEROL (VITAMIN D3) 50 MCG (2000 UT) TABS    Take 2,000 Units by mouth in the morning, at noon, and at bedtime.   CITALOPRAM (CELEXA) 10 MG TABLET    Take 1 tablet (10 mg total) by mouth daily.   CONJUGATED ESTROGENS (PREMARIN) VAGINAL CREAM  Place 1 Applicatorful vaginally 3 (three) times a week.   CYANOCOBALAMIN (VITAMIN B-12) 5000 MCG TBDP    Take by mouth daily.   DENOSUMAB (PROLIA) 60 MG/ML SOSY INJECTION    Inject 60 mg into the skin every 6 (six) months.   GLUCOSAMINE HCL 1500 MG TABS    Take 1,500 mg by mouth daily.   LOPERAMIDE-SIMETHICONE 2-125 MG TABS    Take 1 tablet by mouth 4 (four) times daily as needed (diarrhea/loose stools).   MESALAMINE (LIALDA) 1.2 G EC TABLET    Take 1.2 g by mouth 2 (two) times daily.   METHENAMINE (HIPREX) 1 G TABLET    Take 1 g by mouth 2 (two) times daily with a meal. For UTI Prevention. Last UTI: October 20th, November 5th, and December 28th.   MULTIPLE VITAMIN (MULTIVITAMIN WITH MINERALS) TABS TABLET    Take 1 tablet by mouth daily. One A Day for Women   OMEPRAZOLE-SODIUM BICARBONATE (ZEGERID) 20-1100 MG CAPS CAPSULE    Take 1 capsule by mouth 2 (two) times daily.   POLYETHYL GLYCOL-PROPYL GLYCOL (SYSTANE) 0.4-0.3 % SOLN    Place 1 drop into both eyes as needed (dry/irritated eyes).   PROBIOTIC PRODUCT (PROBIOTIC COLON SUPPORT PO)    Take 1 tablet by mouth in the morning and at bedtime.   PSYLLIUM (METAMUCIL) 58.6 % POWDER    Take 1 packet by mouth 3 (three) times daily.   VALACYCLOVIR (VALTREX) 500 MG TABLET    TAKE (1) TABLET DAILY AS NEEDED.  Modified Medications   No medications on file  Discontinued Medications   DONEPEZIL (ARICEPT) 5 MG TABLET    Take 1 tablet (5 mg total) by mouth at bedtime.    Physical Exam:  Vitals:   11/23/21 1052   BP: 126/72  Pulse: 71  Temp: (!) 96.8 F (36 C)  SpO2: 96%  Weight: 143 lb 3.2 oz (65 kg)  Height: '5\' 2"'$  (1.575 m)   Body mass index is 26.19 kg/m. Wt Readings from Last 3 Encounters:  11/23/21 143 lb 3.2 oz (65 kg)  09/12/21 143 lb 6.4 oz (65 kg)  05/18/21 138 lb 9.6 oz (62.9 kg)    Physical Exam Vitals and nursing note reviewed.  Constitutional:      Appearance: Normal appearance.  Cardiovascular:     Rate and Rhythm: Normal rate.     Pulses: Normal pulses.     Comments: Pulses in lower extremities are palpable and symmetric Pulmonary:     Effort: Pulmonary effort is normal.  Neurological:     General: No focal deficit present.     Mental Status: She is alert and oriented to person, place, and time.  Psychiatric:        Mood and Affect: Mood normal.        Behavior: Behavior normal.     Labs reviewed: Basic Metabolic Panel: Recent Labs    05/18/21 1155 07/07/21 1012  NA 140 141  K 4.5 4.4  CL 101 101  CO2 26 25  GLUCOSE 76 101*  BUN 17 11  CREATININE 0.87 0.80  CALCIUM 9.6 9.2  TSH  --  2.490   Liver Function Tests: No results for input(s): "AST", "ALT", "ALKPHOS", "BILITOT", "PROT", "ALBUMIN" in the last 8760 hours. No results for input(s): "LIPASE", "AMYLASE" in the last 8760 hours. No results for input(s): "AMMONIA" in the last 8760 hours. CBC: Recent Labs    05/18/21 1155  WBC 6.8  HGB 12.7  HCT 38.9  MCV  98.7  PLT 291   Lipid Panel: Recent Labs    10/18/21 1055  CHOL 234*  HDL 82  LDLCALC 124*  TRIG 163*  CHOLHDL 2.9   TSH: Recent Labs    07/07/21 1012  TSH 2.490   A1C: No results found for: "HGBA1C"   Assessment/Plan    1. RLS (restless legs syndrome) We will begin ropinirole 0.25 mg, increasing to 2.5 mg after 2 days.  After that if symptoms persist give [place] a call  2 Nocturnal leg cramps I have suggested stretching the muscles of calves prior to bedtime but also trial of fibro to rub on the calves at bedtime.   Fibro was originally intended for fibromyalgia which is muscle pain.  This might be effective for her leg cramping as well   4. Overactive bladder Sounds like combination of urge and overflow incontinence.  I gave her a sample of Myrbetriq to try after we have gotten her leg cramping issues hopefully resolved   Alain Honey, MD [place] [phone]

## 2021-11-23 NOTE — Telephone Encounter (Signed)
Patient called and stated that Dr. Sabra Heck was going to call in a Rx for Restless Leg but pharmacy has not received it yet.   Reviewed OV Note Dated 11/23/2021: 1. RLS (restless legs syndrome) We will begin ropinirole 0.25 mg, increasing to 2.5 mg after 2 days.  After that if symptoms persist give [place] a call    Rx sent to pharmacy. (Rx was entered as a "syringe" and did not go through to pharmacy earlier today)

## 2021-11-29 ENCOUNTER — Other Ambulatory Visit: Payer: Self-pay | Admitting: Family Medicine

## 2021-11-29 ENCOUNTER — Telehealth: Payer: Self-pay

## 2021-11-29 DIAGNOSIS — G2581 Restless legs syndrome: Secondary | ICD-10-CM

## 2021-11-29 MED ORDER — ROPINIROLE HCL 0.25 MG PO TABS: 0.2500 mg | ORAL_TABLET | Freq: Three times a day (TID) | ORAL | 1 refills | Status: DC

## 2021-11-29 NOTE — Telephone Encounter (Signed)
Pharmacy called to get clarification on Requip dose for patient. Per Dr.Miller he would like for patient to take 2.'5mg'$  daily pharmacist was advised and requested a new Rx to be send into pharmacy for Requip 0.25 mg twice and 2 mg once daily to equal the 2.5 mg or per Dr.Miller's request. Dr. Sabra Heck send in a new rx to pharmacy for patient.

## 2021-12-11 ENCOUNTER — Encounter (HOSPITAL_BASED_OUTPATIENT_CLINIC_OR_DEPARTMENT_OTHER): Payer: Self-pay | Admitting: Emergency Medicine

## 2021-12-11 ENCOUNTER — Emergency Department (HOSPITAL_BASED_OUTPATIENT_CLINIC_OR_DEPARTMENT_OTHER)
Admission: EM | Admit: 2021-12-11 | Discharge: 2021-12-11 | Disposition: A | Discharge status: Home / Self Care | Payer: Medicare Other | Attending: Emergency Medicine | Admitting: Emergency Medicine

## 2021-12-11 ENCOUNTER — Other Ambulatory Visit: Payer: Self-pay

## 2021-12-11 ENCOUNTER — Emergency Department (HOSPITAL_BASED_OUTPATIENT_CLINIC_OR_DEPARTMENT_OTHER): Payer: Medicare Other

## 2021-12-11 DIAGNOSIS — Z91040 Latex allergy status: Secondary | ICD-10-CM | POA: Diagnosis not present

## 2021-12-11 DIAGNOSIS — S5002XA Contusion of left elbow, initial encounter: Secondary | ICD-10-CM | POA: Insufficient documentation

## 2021-12-11 DIAGNOSIS — S50312A Abrasion of left elbow, initial encounter: Secondary | ICD-10-CM | POA: Diagnosis not present

## 2021-12-11 DIAGNOSIS — Y92009 Unspecified place in unspecified non-institutional (private) residence as the place of occurrence of the external cause: Secondary | ICD-10-CM

## 2021-12-11 DIAGNOSIS — Z87891 Personal history of nicotine dependence: Secondary | ICD-10-CM | POA: Insufficient documentation

## 2021-12-11 DIAGNOSIS — W01198A Fall on same level from slipping, tripping and stumbling with subsequent striking against other object, initial encounter: Secondary | ICD-10-CM | POA: Diagnosis not present

## 2021-12-11 DIAGNOSIS — S39012A Strain of muscle, fascia and tendon of lower back, initial encounter: Secondary | ICD-10-CM | POA: Insufficient documentation

## 2021-12-11 DIAGNOSIS — Z7982 Long term (current) use of aspirin: Secondary | ICD-10-CM | POA: Insufficient documentation

## 2021-12-11 DIAGNOSIS — S59902A Unspecified injury of left elbow, initial encounter: Secondary | ICD-10-CM | POA: Diagnosis present

## 2021-12-11 DIAGNOSIS — M545 Low back pain, unspecified: Secondary | ICD-10-CM | POA: Diagnosis not present

## 2021-12-11 DIAGNOSIS — Y92002 Bathroom of unspecified non-institutional (private) residence single-family (private) house as the place of occurrence of the external cause: Secondary | ICD-10-CM | POA: Insufficient documentation

## 2021-12-11 DIAGNOSIS — S0990XA Unspecified injury of head, initial encounter: Secondary | ICD-10-CM | POA: Insufficient documentation

## 2021-12-11 MED ORDER — GABAPENTIN 100 MG PO CAPS
100.0000 mg | ORAL_CAPSULE | Freq: Once | ORAL | Status: AC
  Administered 2021-12-11: 100 mg via ORAL
  Filled 2021-12-11: qty 1

## 2021-12-11 NOTE — ED Triage Notes (Signed)
Pt states she was standing in the bathroom and lost her balance and fell back  Pt states she landed on her butt and left elbow and hit her head  Pt denies LOC  Pt is c/o left sided back pain and left side pain and states she has had back surgery and has rods and screws in her back   Pt also states she has a bump to the back of her head and her left elbow has an abrasioin

## 2021-12-11 NOTE — ED Provider Notes (Signed)
[facility] Provider Note: Georgena Spurling, MD, FACEP  CSN: [id number] MRN: [id number] ARRIVAL: 12/11/21 at [place]: [place] OF PRESENT ILLNESS  12/11/21 3:58 AM Carol Perez is a 81 y.o. female with a history of lumbar fusion and painful neuropathy in her lower extremities.  She was standing in the bathroom prior to arrival and lost her balance, falling backwards.  She landed on her buttocks and struck her left elbow and head as well.  She did not lose consciousness.  She is having 7 out of 10, aching pain in her left upper lumbar region.  She states that this is the upper end of a known rod in her back.  She is not having any new numbness or weakness in her lower extremities.  Pain is worse with movement of her back.   Past Medical History:  Diagnosis Date   Barrett's esophagus    Colitis    DDD (degenerative disc disease)    Factor 5 Leiden mutation, heterozygous [facility])    Per [place] [facility] Patient Packet.   Frequent UTI    H/O echocardiogram 04/01/2019   Per [place] Patient Packet.   Hiatal hernia    Per [place] New Patient Packet.   High grade dysplasia of Barrett's epithelium    Per [facility] New Patient Packet.   History of bladder infections    Per [facility] New Patient Packet.   History of Papanicolaou smear of cervix 10/16/2011   Per [place] Patient Packet   Hypoglycemia    Hypotension    Per [facility] New Patient Packet.   IBS (irritable bowel syndrome)    Per [place] New Patient Packet.   Lactose intolerance    Osteoarthritis    Per [place] New Patient Packet.   Osteopenia    Per [place] New Patient Packet.   Osteoporosis    Premature ventricular complex    Raynaud's disease    Per [place] Patient Packet.   Scoliosis    Per [facility] New Patient Packet.    Past Surgical History:  Procedure Laterality Date   ABDOMINAL EXPOSURE N/A 08/12/2020   Procedure: ABDOMINAL EXPOSURE;  Surgeon: Serafina Mitchell, MD;  Location: [facility] OR;  Service: Vascular;   Laterality: N/A;   ABDOMINAL HYSTERECTOMY  06/13/2011   By Dr.Scherer at [facility]. Per [facility] New Patient Packet.   ANTERIOR LAT LUMBAR FUSION Right 08/12/2020   Procedure: Right Lumbar two-three, Lumbar three-four, Lumbar four-five Anterolateral lumbar interbody fusion;  Surgeon: Erline Levine, MD;  Location: [place];  Service: Neurosurgery;  Laterality: Right;   ANTERIOR LUMBAR FUSION N/A 08/12/2020   Procedure: Lumbar five Sacral one Anterior lumbar interbody fusion;  Surgeon: Erline Levine, MD;  Location: [place];  Service: Neurosurgery;  Laterality: N/A;   BREAST EXCISIONAL BIOPSY Left 30+ yrs ago   COLONOSCOPY  11/23/2011   By Dr.McCune at [facility]. Per [place] Patient Packet.   COLONOSCOPY  08/26/2019   LUMBAR PERCUTANEOUS PEDICLE SCREW 4 LEVEL N/A 08/12/2020   Procedure: Percutaneous pedicle screw fixation from Lumbar two to Sacral one ;  Surgeon: Erline Levine, MD;  Location: [place];  Service: Neurosurgery;  Laterality: N/A;   MAMMOGRAM  04/14/2019   pap smear  10/16/2011   SIGMOIDOSCOPY  11/02/2014   Per [place] New Patient Packet   TONSILLECTOMY  06/12/1948   Per [facility] New Patient Packet.    Family History  Problem Relation Age of Onset  Cancer Mother    Dementia Mother    Alzheimer's disease Mother    Cancer Father    Alzheimer's disease Maternal Grandmother    Dementia Maternal Grandmother    Alzheimer's disease Maternal Grandfather     Social History   Tobacco Use   Smoking status: Former    Types: Cigarettes    Start date: 06/12/1977   Smokeless tobacco: Never   Tobacco comments:    42 years ago. Smoked from age [age]  Vaping Use   Vaping Use: Never used  Substance Use Topics   Alcohol use: Yes    Alcohol/week: 7.0 standard drinks of alcohol    Types: 7 Shots of liquor per week    Comment: one gin and tonic at night   Drug use: No    Prior to Admission medications   Medication Sig Start Date End Date Taking? Authorizing Provider  aspirin EC 81  MG tablet Take 81 mg by mouth daily with breakfast.    [provider]  B Complex-C (B-COMPLEX WITH VITAMIN C) tablet Take 1 tablet by mouth daily.    [provider]  Bilberry, Vaccinium myrtillus, (BILBERRY EXTRACT PO) Take by mouth daily.    [provider]  Biotin 5000 MCG TABS Take 5,000 mcg by mouth daily.    [provider]  Bismuth Subsalicylate (PEPTO-BISMOL PO) Take by mouth as needed.    [provider]  CALCIUM PO Take 1,200 mg by mouth daily.     [provider]  Cholecalciferol (VITAMIN D3) 50 MCG (2000 UT) TABS Take 2,000 Units by mouth in the morning, at noon, and at bedtime.    [provider]  citalopram (CELEXA) 10 MG tablet Take 1 tablet (10 mg total) by mouth daily. 10/18/21   Wardell Honour, MD  conjugated estrogens (PREMARIN) vaginal cream Place 1 Applicatorful vaginally 3 (three) times a week. 06/25/19   Virgie Dad, MD  Cyanocobalamin (VITAMIN B-12) 5000 MCG TBDP Take by mouth daily.    [provider]  denosumab (PROLIA) 60 MG/ML SOSY injection Inject 60 mg into the skin every 6 (six) months.    [provider]  Glucosamine HCl 1500 MG TABS Take 1,500 mg by mouth daily.    [provider]  Loperamide-Simethicone 2-125 MG TABS Take 1 tablet by mouth 4 (four) times daily as needed (diarrhea/loose stools).    [provider]  mesalamine (LIALDA) 1.2 g EC tablet Take 1.2 g by mouth 2 (two) times daily.    [provider]  methenamine (HIPREX) 1 g tablet Take 1 g by mouth 2 (two) times daily with a meal. For UTI Prevention. Last UTI: October 20th, November 5th, and December 28th.    [provider]  Multiple Vitamin (MULTIVITAMIN WITH MINERALS) TABS tablet Take 1 tablet by mouth daily. One A Day for Women    [provider]  Omeprazole-Sodium Bicarbonate (ZEGERID) 20-1100 MG CAPS capsule Take 1 capsule by mouth 2 (two) times daily.    [provider]  Polyethyl Glycol-Propyl Glycol (SYSTANE) 0.4-0.3 % SOLN Place 1 drop into both eyes as needed (dry/irritated eyes).    [provider]  Probiotic Product (PROBIOTIC COLON SUPPORT PO) Take 1 tablet by mouth in the morning and at bedtime.    [provider]  psyllium (METAMUCIL) 58.6 % powder Take 1 packet by mouth 3 (three) times daily.    [provider]  rOPINIRole (REQUIP) 0.25 MG tablet Take 1 tablet (0.25  mg total) by mouth 3 (three) times daily. Start with .25 mg and increase by .25 weekly if symptoms not relieved Do not increase to more tham 1 mg 11/29/21   Wardell Honour, MD  valACYclovir (VALTREX) 500 MG tablet TAKE (1) TABLET DAILY AS NEEDED. 03/29/21   Wardell Honour, MD    Allergies Banana, Latex, Ciprofloxacin hcl, Gatifloxacin, Levofloxacin, Moxifloxacin, Norfloxacin, Nsaids, Ofloxacin, Other, Penicillins, Methylisothiazolinone, and Tape   REVIEW OF SYSTEMS  Negative except as noted here or in the History of Present Illness.   PHYSICAL EXAMINATION  Initial Vital Signs Blood pressure (!) 152/71, pulse 74, temperature 97.9 F (36.6 C), temperature source Oral, resp. rate 14, height '5\' 2"'$  (1.575 m), weight 63.5 kg, SpO2 100 %.  Examination General: Well-developed, well-nourished female in no acute distress; appearance consistent with age of record HENT: normocephalic; small right occipitoparietal hematoma Eyes: pupils equal, round and reactive to light; extraocular muscles intact Neck: supple; nontender Heart: regular rate and rhythm Lungs: clear to auscultation bilaterally Abdomen: soft; nondistended; nontender; bowel sounds present Back: Upper lumbar pain on movement of back Extremities: No deformity; full range of motion; superficial abrasion with underlying hematoma left elbow Neurologic: Awake, alert and oriented; motor function intact in all extremities and symmetric; no facial droop Skin: Warm and dry Psychiatric:  Normal mood and affect   RESULTS  Summary of this visit's results, reviewed and interpreted by myself:   EKG Interpretation  Date/Time:    Ventricular Rate:    PR Interval:    QRS Duration:   QT Interval:    QTC Calculation:   R Axis:     Text Interpretation:         Laboratory Studies: No results found for this or any previous visit (from the past 24 hour(s)). Imaging Studies: DG Lumbar Spine Complete  Result Date: 12/11/2021 CLINICAL DATA:  Low back pain following fall, initial encounter EXAM: LUMBAR SPINE - COMPLETE 4+ VIEW COMPARISON:  07/04/2021 FINDINGS: Five lumbar type vertebral bodies are well visualized. Vertebral body height is well maintained. Interbody fusion from L2-S1 is noted with posterior fixation. No pars defects are noted. No acute bony abnormality is seen. Osteophytic changes are noted stable from the previous CT. IMPRESSION: Postsurgical and degenerative change without acute abnormality. Electronically Signed   By: Inez Catalina M.D.   On: 12/11/2021 02:27    ED COURSE and MDM  Nursing notes, initial and subsequent vitals signs, including pulse oximetry, reviewed and interpreted by myself.  Vitals:   12/11/21 0143 12/11/21 0144  BP: (!) 152/71   Pulse: 74   Resp: 14   Temp: 97.9 F (36.6 C)   TempSrc: Oral   SpO2: 100%   Weight:  63.5 kg  Height:  '5\' 2"'$  (1.575 m)   Medications  gabapentin (NEURONTIN) capsule 100 mg (has no administration in time range)   No evidence of bony injury or damage to spinal hardware on radiographs.  Patient's neuro exam unchanged from her baseline.  I do not believe the left elbow needs radiographic evaluation as her injury appears superficial and she is not having significant pain.  She has taken Neurontin in the past for back pain and we will give her 100 mg prior to discharge.  She does not take it regularly due to side effects (cognitive impairment) and she does not wish a narcotic.   PROCEDURES  Procedures   ED  DIAGNOSES     ICD-10-CM   1. Fall in home, initial encounter  W19.Merril Abbe  Y92.009     2. Minor head injury, initial encounter  S09.90XA     3. Contusion of left elbow, initial encounter  S50.02XA     4. Abrasion of left elbow, initial encounter  S50.312A     5. Lumbar strain, initial encounter  S39.012A          Shanon Rosser, MD 12/11/21 [phone]

## 2021-12-20 ENCOUNTER — Other Ambulatory Visit: Payer: Self-pay | Admitting: Gastroenterology

## 2021-12-20 DIAGNOSIS — R9389 Abnormal findings on diagnostic imaging of other specified body structures: Secondary | ICD-10-CM

## 2022-01-04 ENCOUNTER — Encounter: Payer: Self-pay | Admitting: Family

## 2022-01-04 ENCOUNTER — Ambulatory Visit
Admission: RE | Admit: 2022-01-04 | Discharge: 2022-01-04 | Disposition: A | Discharge status: Home / Self Care | Payer: Medicare Other | Source: Ambulatory Visit | Attending: Family | Admitting: Family

## 2022-01-04 ENCOUNTER — Ambulatory Visit (INDEPENDENT_AMBULATORY_CARE_PROVIDER_SITE_OTHER): Payer: Medicare Other | Admitting: Family

## 2022-01-04 VITALS — BP 138/88 | HR 86 | Temp 97.5°F | Resp 16 | Ht 62.0 in | Wt 140.6 lb

## 2022-01-04 DIAGNOSIS — R1032 Left lower quadrant pain: Secondary | ICD-10-CM | POA: Diagnosis not present

## 2022-01-04 DIAGNOSIS — Z981 Arthrodesis status: Secondary | ICD-10-CM | POA: Diagnosis not present

## 2022-01-04 DIAGNOSIS — R413 Other amnesia: Secondary | ICD-10-CM

## 2022-01-04 DIAGNOSIS — I700 Atherosclerosis of aorta: Secondary | ICD-10-CM | POA: Diagnosis not present

## 2022-01-04 DIAGNOSIS — S299XXA Unspecified injury of thorax, initial encounter: Secondary | ICD-10-CM | POA: Diagnosis not present

## 2022-01-04 DIAGNOSIS — R0781 Pleurodynia: Secondary | ICD-10-CM

## 2022-01-04 LAB — POCT URINALYSIS DIPSTICK
Bilirubin, UA: NEGATIVE
Glucose, UA: NEGATIVE
Ketones, UA: POSITIVE
Nitrite, UA: NEGATIVE
Protein, UA: NEGATIVE
Spec Grav, UA: 1.015 (ref 1.010–1.025)
Urobilinogen, UA: NEGATIVE E.U./dL — AB
pH, UA: 8.5 — AB (ref 5.0–8.0)

## 2022-01-04 NOTE — Patient Instructions (Signed)
-    Please get left rib cage X-ray at [place] at [facility] then will call you with results.

## 2022-01-04 NOTE — Progress Notes (Signed)
Provider: Toshiyuki Fredell FNP-C  Frederica Kuster, MD  Patient Care Team: Frederica Kuster, MD as PCP - General (Family Medicine) Drema Halon, MD (Inactive) as Consulting Physician (Otolaryngology) Maris Berger, MD as Consulting Physician (Ophthalmology)  Extended Emergency Contact Information Primary Emergency Contact: Hoefer,William M Address: [address]          [place], [place] [address] D[place] of [place] Home Phone: [phone] Mobile Phone: [phone] Relation: Spouse Secondary Emergency Contact: Reeves,Pam    POA Address: [address]          [place], [place] [address] D[place] of [place] Home Phone: [phone] Relation: Niece Preferred language: English Interpreter needed? No  Code Status:  DNR Goals of care: Advanced Directive information    01/04/2022    2:08 PM  Advanced Directives  Does Patient Have a Medical Advance Directive? Yes  Type of Estate agent of [place];Living will;Out of facility DNR (pink MOST or yellow form)  Does patient want to make changes to medical advance directive? No - Patient declined  Copy of Healthcare Power of Attorney in Chart? No - copy requested     Chief Complaint  Patient presents with   Acute Visit    Patient complains of falling a couple of weeks ago. States left side is still hurting, and requesting X-ray.    HPI:  Pt is a 81 y.o. female seen today for an acute visit for evaluation of  Larey Seat 12/13/2021 in the shower and hit her lower back  and head.she was seen in the ED imaging was normal.everything got better except left lower back  Woke up this morning very confused and shaky.she could not remember anything.she could not use the computer had to sit for a while to recall how to use the computer. Denies any She denies any fever,chills,cough,fatigue,body aches,runny nose,chest tightness,chest pain,palpitation or shortness of breath. Also denies  any symptoms of UTI but has had some lower abdomen slight pain.    Past Medical History:  Diagnosis Date   Barrett's esophagus    Colitis    DDD (degenerative disc disease)    Factor 5 Leiden mutation, heterozygous [facility])    Per [facility] New Patient Packet.   Frequent UTI    H/O echocardiogram 04/01/2019   Per [facility] New Patient Packet.   Hiatal hernia    Per [facility] New Patient Packet.   High grade dysplasia of Barrett's epithelium    Per [facility] New Patient Packet.   History of bladder infections    Per [facility] New Patient Packet.   History of Papanicolaou smear of cervix 10/16/2011   Per [facility] New Patient Packet   Hypoglycemia    Hypotension    Per [facility] New Patient Packet.   IBS (irritable bowel syndrome)    Per [facility] New Patient Packet.   Lactose intolerance    Osteoarthritis    Per [facility] New Patient Packet.   Osteopenia    Per [facility] New Patient Packet.   Osteoporosis    Premature ventricular complex    Raynaud's disease    Per [facility] New Patient Packet.   Scoliosis    Per [facility] New Patient Packet.   Past Surgical History:  Procedure Laterality Date   ABDOMINAL EXPOSURE N/A 08/12/2020   Procedure: ABDOMINAL EXPOSURE;  Surgeon: Nada Libman, MD;  Location: [facility] OR;  Service: Vascular;  Laterality: N/A;   ABDOMINAL HYSTERECTOMY  06/13/2011   By Dr.Scherer at [facility]. Per [facility] New Patient  Packet.   ANTERIOR LAT LUMBAR FUSION Right 08/12/2020   Procedure: Right Lumbar two-three, Lumbar three-four, Lumbar four-five Anterolateral lumbar interbody fusion;  Surgeon: Erline Levine, MD;  Location: [place];  Service: Neurosurgery;  Laterality: Right;   ANTERIOR LUMBAR FUSION N/A 08/12/2020   Procedure: Lumbar five Sacral one Anterior lumbar interbody fusion;  Surgeon: Erline Levine, MD;  Location: [place];  Service: Neurosurgery;  Laterality: N/A;   BREAST EXCISIONAL BIOPSY Left 30+ yrs ago   COLONOSCOPY  11/23/2011   By Dr.McCune at [facility]. Per [facility] Patient Packet.   COLONOSCOPY   08/26/2019   LUMBAR PERCUTANEOUS PEDICLE SCREW 4 LEVEL N/A 08/12/2020   Procedure: Percutaneous pedicle screw fixation from Lumbar two to Sacral one ;  Surgeon: Erline Levine, MD;  Location: [place];  Service: Neurosurgery;  Laterality: N/A;   MAMMOGRAM  04/14/2019   pap smear  10/16/2011   SIGMOIDOSCOPY  11/02/2014   Per [place] New Patient Packet   TONSILLECTOMY  06/12/1948   Per [facility] New Patient Packet.    Allergies  Allergen Reactions   Banana Swelling    Swelling around mouth and eyes and red blotches   Latex Swelling, Rash and Other (See Comments)   Ciprofloxacin Hcl Other (See Comments)    Severe knee inflammation    Gatifloxacin Other (See Comments)    Severe knee inflammation    Levofloxacin Other (See Comments)    Severe knee inflammation    Moxifloxacin Other (See Comments)    Severe knee inflammation    Norfloxacin Other (See Comments)    Severe knee inflammation    Nsaids Other (See Comments)    Upsets ulcers   Ofloxacin Other (See Comments)    Severe knee inflammation   Other Other (See Comments)    FLOXIN   Penicillins Hives    Tolerates (KEFLEX-cephalexin)    Methylisothiazolinone Rash and Other (See Comments)    A preservative found in a cream pt used   Tape Rash    Outpatient Encounter Medications as of 01/04/2022  Medication Sig   aspirin EC 81 MG tablet Take 243 mg by mouth daily with breakfast.   AZO-CRANBERRY PO Take 1 capsule by mouth as needed (UTI Symptoms).   Bilberry, Vaccinium myrtillus, (BILBERRY EXTRACT PO) Take by mouth as needed.   Bismuth Subsalicylate (PEPTO-BISMOL PO) Take by mouth as needed.   CALCIUM PO Take 1,200 mg by mouth daily.    Cholecalciferol (VITAMIN D3) 50 MCG (2000 UT) TABS Take 2,000 Units by mouth in the morning, at noon, and at bedtime.   citalopram (CELEXA) 10 MG tablet Take 1 tablet (10 mg total) by mouth daily.   conjugated estrogens (PREMARIN) vaginal cream Place 1 applicator vaginally once a week.   denosumab  (PROLIA) 60 MG/ML SOSY injection Inject 60 mg into the skin every 6 (six) months.   fluticasone (FLONASE) 50 MCG/ACT nasal spray Place 1 spray into both nostrils as needed for allergies or rhinitis.   Glucosamine HCl 1500 MG TABS Take 1,500 mg by mouth daily.   Loperamide-Simethicone 2-125 MG TABS Take 1 tablet by mouth 4 (four) times daily as needed (diarrhea/loose stools).   mesalamine (LIALDA) 1.2 g EC tablet Take 1.2 g by mouth 2 (two) times daily.   methenamine (HIPREX) 1 g tablet Take 1 g by mouth 2 (two) times daily with a meal. For UTI Prevention. Last UTI: October 20th, November 5th, and December 28th.   Multiple Vitamin (MULTIVITAMIN WITH MINERALS) TABS tablet Take 1 tablet  by mouth daily. One A Day for Women   Omeprazole-Sodium Bicarbonate (ZEGERID) 20-1100 MG CAPS capsule Take 1 capsule by mouth 2 (two) times daily.   OVER THE COUNTER MEDICATION Place 1 drop into both ears as needed. Grapefruit Seed Extract Oil   Polyethyl Glycol-Propyl Glycol (SYSTANE) 0.4-0.3 % SOLN Place 1 drop into both eyes as needed (dry/irritated eyes).   Probiotic Product (PROBIOTIC COLON SUPPORT PO) Take 1 tablet by mouth in the morning and at bedtime.   psyllium (METAMUCIL) 58.6 % powder Take 1 packet by mouth 3 (three) times daily.   rOPINIRole (REQUIP) 0.25 MG tablet Take 1 tablet (0.25 mg total) by mouth 3 (three) times daily. Start with .25 mg and increase by .25 weekly if symptoms not relieved Do not increase to more tham 1 mg   valACYclovir (VALTREX) 500 MG tablet TAKE (1) TABLET DAILY AS NEEDED.   [DISCONTINUED] B Complex-C (B-COMPLEX WITH VITAMIN C) tablet Take 1 tablet by mouth daily.   [DISCONTINUED] Biotin 5000 MCG TABS Take 5,000 mcg by mouth daily.   [DISCONTINUED] conjugated estrogens (PREMARIN) vaginal cream Place 1 Applicatorful vaginally 3 (three) times a week.   [DISCONTINUED] Cyanocobalamin (VITAMIN B-12) 5000 MCG TBDP Take by mouth daily.   No facility-administered encounter medications  on file as of 01/04/2022.    Review of Systems  Constitutional:  Negative for appetite change, chills, fatigue, fever and unexpected weight change.  HENT:  Negative for congestion, dental problem, ear discharge, ear pain, facial swelling, hearing loss, nosebleeds, postnasal drip, rhinorrhea, sinus pressure, sinus pain, sneezing, sore throat, tinnitus and trouble swallowing.   Eyes:  Negative for pain, discharge, redness, itching and visual disturbance.  Respiratory:  Negative for cough, chest tightness, shortness of breath and wheezing.        Left rib cage pain from fall episode 12/11/2021  Cardiovascular:  Negative for chest pain, palpitations and leg swelling.  Gastrointestinal:  Positive for abdominal pain. Negative for abdominal distention, blood in stool, constipation, diarrhea, nausea and vomiting.       LLQ tenderness   Genitourinary:  Negative for difficulty urinating, dysuria, flank pain, frequency and urgency.  Musculoskeletal:  Positive for arthralgias and gait problem. Negative for myalgias, neck pain and neck stiffness.       Left rib cage   Skin:  Negative for color change, pallor, rash and wound.  Neurological:  Negative for dizziness, syncope, speech difficulty, weakness, light-headedness, numbness and headaches.  Psychiatric/Behavioral:  Negative for agitation, behavioral problems, confusion, hallucinations, self-injury, sleep disturbance and suicidal ideas. The patient is not nervous/anxious.        Memory loss     Immunization History  Administered Date(s) Administered   Influenza, High Dose Seasonal PF 03/26/2019   Moderna Sars-Covid-2 Vaccination 06/16/2019, 07/14/2019, 04/26/2020   Pneumococcal Conjugate-13 12/12/2014   Pneumococcal Polysaccharide-23 11/20/2019   Tdap 04/08/2008, 11/20/2019   Zoster Recombinat (Shingrix) 06/12/2017   Pertinent  Health Maintenance Due  Topic Date Due   INFLUENZA VACCINE  01/10/2022   DEXA SCAN  Completed      02/08/2021   12:52  PM 02/17/2021    9:34 AM 05/18/2021   10:58 AM 12/11/2021    1:47 AM 01/04/2022    2:07 PM  Fall Risk  Falls in the past year? 0 0 0  1  Was there an injury with Fall? 0 0 0  1  Fall Risk Category Calculator 0 0 0  2  Fall Risk Category Low Low Low  Moderate  Patient Fall  Risk Level Low fall risk Low fall risk Low fall risk Low fall risk Moderate fall risk  Patient at Risk for Falls Due to No Fall Risks No Fall Risks History of fall(s)  History of fall(s);Impaired balance/gait  Fall risk Follow up Falls evaluation completed Falls evaluation completed Falls evaluation completed;Education provided;Falls prevention discussed  Falls evaluation completed;Education provided;Falls prevention discussed   Functional Status Survey:    Vitals:   01/04/22 1356  BP: 138/88  Pulse: 86  Resp: 16  Temp: (!) 97.5 F (36.4 C)  SpO2: 95%  Weight: 140 lb 9.6 oz (63.8 kg)  Height: 5\' 2"  (1.575 m)   Body mass index is 25.72 kg/m. Physical Exam Vitals reviewed.  Constitutional:      General: She is not in acute distress.    Appearance: Normal appearance. She is normal weight. She is not ill-appearing or diaphoretic.  HENT:     Head: Normocephalic.  Eyes:     General: No scleral icterus.       Right eye: No discharge.        Left eye: No discharge.     Conjunctiva/sclera: Conjunctivae normal.     Pupils: Pupils are equal, round, and reactive to light.  Neck:     Vascular: No carotid bruit.  Cardiovascular:     Rate and Rhythm: Normal rate and regular rhythm.     Pulses: Normal pulses.     Heart sounds: Normal heart sounds. No murmur heard.    No friction rub. No gallop.  Pulmonary:     Effort: Pulmonary effort is normal. No respiratory distress.     Breath sounds: Normal breath sounds. No wheezing, rhonchi or rales.  Chest:     Chest wall: Tenderness present.     Comments: Left rib cage tenderness on palpation  Abdominal:     General: Bowel sounds are normal. There is no distension.      Palpations: Abdomen is soft. There is no mass.     Tenderness: There is no abdominal tenderness. There is no right CVA tenderness, left CVA tenderness, guarding or rebound.  Musculoskeletal:        General: No swelling or tenderness. Normal range of motion.     Cervical back: Normal range of motion. No rigidity or tenderness.     Right lower leg: No edema.     Left lower leg: No edema.  Lymphadenopathy:     Cervical: No cervical adenopathy.  Skin:    General: Skin is warm and dry.     Coloration: Skin is not pale.     Findings: No bruising, erythema, lesion or rash.  Neurological:     Mental Status: She is alert and oriented to person, place, and time.     Cranial Nerves: No cranial nerve deficit.     Sensory: No sensory deficit.     Motor: No weakness.     Coordination: Coordination normal.     Gait: Gait normal.  Psychiatric:        Mood and Affect: Mood normal.        Speech: Speech normal.        Behavior: Behavior normal.        Thought Content: Thought content normal.        Cognition and Memory: Memory is impaired.        Judgment: Judgment normal.     Comments: Scored 26/30 on MMSE today was unable to recall three words that was give earlier on exam.  Labs reviewed: Recent Labs    05/18/21 1155 07/07/21 1012  NA 140 141  K 4.5 4.4  CL 101 101  CO2 26 25  GLUCOSE 76 101*  BUN 17 11  CREATININE 0.87 0.80  CALCIUM 9.6 9.2   No results for input(s): "AST", "ALT", "ALKPHOS", "BILITOT", "PROT", "ALBUMIN" in the last 8760 hours. Recent Labs    05/18/21 1155  WBC 6.8  HGB 12.7  HCT 38.9  MCV 98.7  PLT 291   Lab Results  Component Value Date   TSH 2.490 07/07/2021   No results found for: "HGBA1C" Lab Results  Component Value Date   CHOL 234 (H) 10/18/2021   HDL 82 10/18/2021   LDLCALC 124 (H) 10/18/2021   TRIG 163 (H) 10/18/2021   CHOLHDL 2.9 10/18/2021    Significant Diagnostic Results in last 30 days:  DG Lumbar Spine Complete  Result Date:  12/11/2021 CLINICAL DATA:  Low back pain following fall, initial encounter EXAM: LUMBAR SPINE - COMPLETE 4+ VIEW COMPARISON:  07/04/2021 FINDINGS: Five lumbar type vertebral bodies are well visualized. Vertebral body height is well maintained. Interbody fusion from L2-S1 is noted with posterior fixation. No pars defects are noted. No acute bony abnormality is seen. Osteophytic changes are noted stable from the previous CT. IMPRESSION: Postsurgical and degenerative change without acute abnormality. Electronically Signed   By: Inez Catalina M.D.   On: 12/11/2021 02:27    Assessment/Plan 1. LLQ abdominal pain Tender to palpation  Will rule out UTI.Has up coming CT of Abdomen on 01/17/2022  - Urine Culture - CBC with Differential/Platelet - CMP with eGFR([facility]) - POC Urinalysis Dipstick  2. Rib pain on left side Tenderness since post fall on 12/11/2021 was seen in the ED reports no x-ray done in the rib cage  Left-sided rib cage tender on palpation - DG Ribs Unilateral Left; Future -Continue on over-the-counter analgesic as needed for pain  3. Memory loss Worsening memory loss could not remember how to use the computer when she woke up this morning. -Scored 26/30 on MMSE she was unable to recall 3 words given early on during the exam. -Encouraged to do activities that stimulate the brain -Continue to follow-up with a neurologist  Family/ staff Communication: Reviewed plan of care with patient and husband verbalized understanding  Labs/tests ordered:  - Urine Culture - CBC with Differential/Platelet - CMP with eGFR([facility]) - POC Urinalysis Dipstick - DG Ribs Unilateral Left; Future  Next Appointment: Return if symptoms worsen or fail to improve.   Sandrea Hughs, NP

## 2022-01-05 LAB — CBC WITH DIFFERENTIAL/PLATELET
Absolute Monocytes: 701 cells/uL (ref 200–950)
Basophils Absolute: 69 cells/uL (ref 0–200)
Basophils Relative: 0.9 %
Eosinophils Absolute: 0 cells/uL — ABNORMAL LOW (ref 15–500)
Eosinophils Relative: 0 %
HCT: 39.8 % (ref 35.0–45.0)
Hemoglobin: 13 g/dL (ref 11.7–15.5)
Lymphs Abs: 1625 cells/uL (ref 850–3900)
MCH: 32.1 pg (ref 27.0–33.0)
MCHC: 32.7 g/dL (ref 32.0–36.0)
MCV: 98.3 fL (ref 80.0–100.0)
MPV: 10.3 fL (ref 7.5–12.5)
Monocytes Relative: 9.1 %
Neutro Abs: 5305 cells/uL (ref 1500–7800)
Neutrophils Relative %: 68.9 %
Platelets: 272 10*3/uL (ref 140–400)
RBC: 4.05 10*6/uL (ref 3.80–5.10)
RDW: 12.4 % (ref 11.0–15.0)
Total Lymphocyte: 21.1 %
WBC: 7.7 10*3/uL (ref 3.8–10.8)

## 2022-01-05 LAB — COMPLETE METABOLIC PANEL WITH GFR
AG Ratio: 1.7 (calc) (ref 1.0–2.5)
ALT: 14 U/L (ref 6–29)
AST: 24 U/L (ref 10–35)
Albumin: 4.6 g/dL (ref 3.6–5.1)
Alkaline phosphatase (APISO): 68 U/L (ref 37–153)
BUN: 12 mg/dL (ref 7–25)
CO2: 29 mmol/L (ref 20–32)
Calcium: 10.2 mg/dL (ref 8.6–10.4)
Chloride: 100 mmol/L (ref 98–110)
Creat: 0.97 mg/dL (ref 0.60–1.00)
Globulin: 2.7 g/dL (calc) (ref 1.9–3.7)
Glucose, Bld: 90 mg/dL (ref 65–99)
Potassium: 5.2 mmol/L (ref 3.5–5.3)
Sodium: 139 mmol/L (ref 135–146)
Total Bilirubin: 0.4 mg/dL (ref 0.2–1.2)
Total Protein: 7.3 g/dL (ref 6.1–8.1)
eGFR: 59 mL/min/{1.73_m2} — ABNORMAL LOW (ref >=60)

## 2022-01-05 LAB — URINE CULTURE
MICRO NUMBER:: 13698401
SPECIMEN QUALITY:: ADEQUATE

## 2022-01-09 ENCOUNTER — Other Ambulatory Visit: Payer: Self-pay

## 2022-01-09 DIAGNOSIS — E785 Hyperlipidemia, unspecified: Secondary | ICD-10-CM

## 2022-01-09 MED ORDER — ATORVASTATIN CALCIUM 10 MG PO TABS: 10.0000 mg | ORAL_TABLET | Freq: Every day | ORAL | 0 refills | Status: DC

## 2022-01-10 ENCOUNTER — Telehealth: Payer: Self-pay | Admitting: *Deleted

## 2022-01-10 NOTE — Telephone Encounter (Signed)
Patient called and stated that Someone called her yesterday regarding her labwork she had done last week. Stated that she asked about the "Sugar" and they told her that it was not tested.   Patient stated that she hung up and another Woman called and told her that she had Diabetes. Patient argued with her because she was just told that "Sugar" was not done. Patient stated that the Woman got agitated with her and hung up.   Patient is calling because she is confused and wants to know if she has diabetes or not. Doesn't understand all the confusion.   Please Advise. (Forwarded message to PCP and Chrae)

## 2022-01-10 NOTE — Telephone Encounter (Signed)
Noted, we will have Dr.Miller advise, however I was unable to find documentation of someone speaking to patient yesterday.   I will share this message with Evlyn Clines, Dr.Miller's medical assistant to keep her in the loop.

## 2022-01-11 NOTE — Telephone Encounter (Signed)
Spoke to patient and she stated  no one clinical had called her from [facility] and she doesn't know who the person was that called her phone. She was calling office to get lab results from 01/04/22 because no one ever called and let her know the results. I advised patient I will speak with Dinah Ngetich,NP about resulting her labs so that a CMA can give her a call advising her of the results. I advised Dinah Ngetich,NP that patient would like to know lab results.

## 2022-01-16 DIAGNOSIS — L819 Disorder of pigmentation, unspecified: Secondary | ICD-10-CM | POA: Diagnosis not present

## 2022-01-16 DIAGNOSIS — D692 Other nonthrombocytopenic purpura: Secondary | ICD-10-CM | POA: Diagnosis not present

## 2022-01-16 DIAGNOSIS — L821 Other seborrheic keratosis: Secondary | ICD-10-CM | POA: Diagnosis not present

## 2022-01-16 DIAGNOSIS — D2261 Melanocytic nevi of right upper limb, including shoulder: Secondary | ICD-10-CM | POA: Diagnosis not present

## 2022-01-17 ENCOUNTER — Ambulatory Visit
Admission: RE | Admit: 2022-01-17 | Discharge: 2022-01-17 | Disposition: A | Discharge status: Home / Self Care | Payer: Medicare Other | Source: Ambulatory Visit | Attending: Gastroenterology | Admitting: Gastroenterology

## 2022-01-17 DIAGNOSIS — N8110 Cystocele, unspecified: Secondary | ICD-10-CM | POA: Diagnosis not present

## 2022-01-17 DIAGNOSIS — K5730 Diverticulosis of large intestine without perforation or abscess without bleeding: Secondary | ICD-10-CM | POA: Diagnosis not present

## 2022-01-17 DIAGNOSIS — K449 Diaphragmatic hernia without obstruction or gangrene: Secondary | ICD-10-CM | POA: Diagnosis not present

## 2022-01-17 DIAGNOSIS — R9389 Abnormal findings on diagnostic imaging of other specified body structures: Secondary | ICD-10-CM

## 2022-01-17 MED ORDER — IOPAMIDOL (ISOVUE-370) INJECTION 76%
75.0000 mL | Freq: Once | INTRAVENOUS | Status: AC | PRN
  Administered 2022-01-17: 75 mL via INTRAVENOUS

## 2022-02-01 ENCOUNTER — Other Ambulatory Visit: Payer: Self-pay | Admitting: Family

## 2022-02-01 ENCOUNTER — Encounter: Payer: Self-pay | Admitting: Family

## 2022-02-01 ENCOUNTER — Ambulatory Visit (INDEPENDENT_AMBULATORY_CARE_PROVIDER_SITE_OTHER): Payer: Medicare Other | Admitting: Family

## 2022-02-01 ENCOUNTER — Other Ambulatory Visit: Payer: Self-pay | Admitting: Family Medicine

## 2022-02-01 VITALS — BP 142/90 | HR 74 | Temp 97.1°F | Ht 62.0 in | Wt 140.0 lb

## 2022-02-01 DIAGNOSIS — R251 Tremor, unspecified: Secondary | ICD-10-CM

## 2022-02-01 DIAGNOSIS — K2270 Barrett's esophagus without dysplasia: Secondary | ICD-10-CM | POA: Diagnosis not present

## 2022-02-01 DIAGNOSIS — R300 Dysuria: Secondary | ICD-10-CM

## 2022-02-01 DIAGNOSIS — K52832 Lymphocytic colitis: Secondary | ICD-10-CM | POA: Diagnosis not present

## 2022-02-01 DIAGNOSIS — F339 Major depressive disorder, recurrent, unspecified: Secondary | ICD-10-CM | POA: Diagnosis not present

## 2022-02-01 DIAGNOSIS — R109 Unspecified abdominal pain: Secondary | ICD-10-CM | POA: Diagnosis not present

## 2022-02-01 DIAGNOSIS — K529 Noninfective gastroenteritis and colitis, unspecified: Secondary | ICD-10-CM | POA: Diagnosis not present

## 2022-02-01 DIAGNOSIS — E785 Hyperlipidemia, unspecified: Secondary | ICD-10-CM

## 2022-02-01 LAB — POCT URINALYSIS DIPSTICK
Bilirubin, UA: NEGATIVE
Glucose, UA: NEGATIVE
Ketones, UA: NEGATIVE
Leukocytes, UA: NEGATIVE
Nitrite, UA: NEGATIVE
Protein, UA: NEGATIVE
Spec Grav, UA: 1.01 (ref 1.010–1.025)
Urobilinogen, UA: 0.2 E.U./dL
pH, UA: 6.5 (ref 5.0–8.0)

## 2022-02-01 MED ORDER — CITALOPRAM HYDROBROMIDE 10 MG PO TABS: ORAL_TABLET | ORAL | 3 refills | Status: DC

## 2022-02-01 NOTE — Progress Notes (Signed)
Provider: Elver Stadler FNP-C  Wardell Honour, MD  Patient Care Team: Wardell Honour, MD as PCP - General (Family Medicine) Rozetta Nunnery, MD (Inactive) as Consulting Physician (Otolaryngology) Luberta Mutter, MD as Consulting Physician (Ophthalmology)  Extended Emergency Contact Information Primary Emergency Contact: Shrode,William M Address: [address] [place]          [place], [place] [address] Johnnette Litter of [place] Phone: [phone] Mobile Phone: [phone] Relation: Spouse Secondary Emergency Contact: Reeves,Pam    POA Address: [place], [place] [address] Johnnette Litter of [place] Phone: [phone] Relation: Niece Preferred language: English Interpreter needed? No  Code Status:  Full Code  Goals of care: Advanced Directive information    01/04/2022    2:08 PM  Advanced Directives  Does Patient Have a Medical Advance Directive? Yes  Type of Paramedic of [place];Living will;Out of facility DNR (pink MOST or yellow form)  Does patient want to make changes to medical advance directive? No - Patient declined  Copy of [place] in Chart? No - copy requested     Chief Complaint  Patient presents with   Acute Visit    Patient would like to discuss blood sugars and urinary concerns. Patient c/o burning when urinating off/on, will take AZO when symptoms onset. Patient states hands and legs are shaky and if she eats something sweet her symptoms get better. Patient also c/o issues with short-term memory (last MMSE 01/04/22, 26/30)    HPI:  Pt is a 81 y.o. female seen today for an acute visit to discuss blood sugars and urinary concerns.complains of burning on and off with urination.Takes AZO tablet.denies any fever,chills,nausea,vomiting,abdominal pain,flank pain,urgency,frequency,difficult urination or hematuria,  Hands and legs shakes but improves when she  eats something sweet.Eats 3 meals per day.  No history of diabetes.  Also complains of memory loss.Scored 26/30 on MMSE on 01/04/2022.    Past Medical History:  Diagnosis Date   Barrett's esophagus    Colitis    DDD (degenerative disc disease)    Factor 5 Leiden mutation, heterozygous [facility])    Per [place] New Patient Packet.   Frequent UTI    H/O echocardiogram 04/01/2019   Per [place] Patient Packet.   Hiatal hernia    Per [place] New Patient Packet.   High grade dysplasia of Barrett's epithelium    Per [facility] New Patient Packet.   History of bladder infections    Per [facility] New Patient Packet.   History of Papanicolaou smear of cervix 10/16/2011   Per [place] Patient Packet   Hypoglycemia    Hypotension    Per [facility] New Patient Packet.   IBS (irritable bowel syndrome)    Per [place] New Patient Packet.   Lactose intolerance    Osteoarthritis    Per [place] New Patient Packet.   Osteopenia    Per [place] New Patient Packet.   Osteoporosis    Premature ventricular complex    Raynaud's disease    Per [place] Patient Packet.   Scoliosis    Per [facility] New Patient Packet.   Past Surgical History:  Procedure Laterality Date   ABDOMINAL EXPOSURE N/A 08/12/2020   Procedure: ABDOMINAL EXPOSURE;  Surgeon: Serafina Mitchell, MD;  Location: [facility] OR;  Service: Vascular;  Laterality: N/A;   ABDOMINAL HYSTERECTOMY  06/13/2011   By Dr.Scherer at [facility]. Per [facility] New Patient Packet.  ANTERIOR LAT LUMBAR FUSION Right 08/12/2020   Procedure: Right Lumbar two-three, Lumbar three-four, Lumbar four-five Anterolateral lumbar interbody fusion;  Surgeon: Erline Levine, MD;  Location: [place];  Service: Neurosurgery;  Laterality: Right;   ANTERIOR LUMBAR FUSION N/A 08/12/2020   Procedure: Lumbar five Sacral one Anterior lumbar interbody fusion;  Surgeon: Erline Levine, MD;  Location: [place];  Service: Neurosurgery;  Laterality: N/A;   BREAST EXCISIONAL BIOPSY Left 30+ yrs ago   COLONOSCOPY  11/23/2011   By Dr.McCune at  [facility]. Per [place] Patient Packet.   COLONOSCOPY  08/26/2019   LUMBAR PERCUTANEOUS PEDICLE SCREW 4 LEVEL N/A 08/12/2020   Procedure: Percutaneous pedicle screw fixation from Lumbar two to Sacral one ;  Surgeon: Erline Levine, MD;  Location: [place];  Service: Neurosurgery;  Laterality: N/A;   MAMMOGRAM  04/14/2019   pap smear  10/16/2011   SIGMOIDOSCOPY  11/02/2014   Per [place] New Patient Packet   TONSILLECTOMY  06/12/1946   Per [facility] New Patient Packet.    Allergies  Allergen Reactions   Banana Swelling    Swelling around mouth and eyes and red blotches   Latex Swelling, Rash and Other (See Comments)   Ciprofloxacin Hcl Other (See Comments)    Severe knee inflammation    Gatifloxacin Other (See Comments)    Severe knee inflammation    Levofloxacin Other (See Comments)    Severe knee inflammation    Moxifloxacin Other (See Comments)    Severe knee inflammation    Norfloxacin Other (See Comments)    Severe knee inflammation    Nsaids Other (See Comments)    Upsets ulcers   Ofloxacin Other (See Comments)    Severe knee inflammation   Other Other (See Comments)    FLOXIN   Penicillins Hives    Tolerates (KEFLEX-cephalexin)    Methylisothiazolinone Rash and Other (See Comments)    A preservative found in a cream pt used   Tape Rash    Outpatient Encounter Medications as of 02/01/2022  Medication Sig   aspirin EC 81 MG tablet Take 243 mg by mouth daily with breakfast.   atorvastatin (LIPITOR) 10 MG tablet Take 1 tablet (10 mg total) by mouth daily.   AZO-CRANBERRY PO Take 1 capsule by mouth as needed (UTI Symptoms).   Bilberry, Vaccinium myrtillus, (BILBERRY EXTRACT PO) Take by mouth as needed.   Bismuth Subsalicylate (PEPTO-BISMOL PO) Take by mouth as needed.   CALCIUM PO Take 1,200 mg by mouth daily.    Cholecalciferol (VITAMIN D3) 50 MCG (2000 UT) TABS Take 2,000 Units by mouth in the morning, at noon, and at bedtime.   citalopram (CELEXA) 10 MG tablet  Take 1 tablet (10 mg total) by mouth daily.   conjugated estrogens (PREMARIN) vaginal cream Place 1 applicator vaginally once a week.   denosumab (PROLIA) 60 MG/ML SOSY injection Inject 60 mg into the skin every 6 (six) months.   fluticasone (FLONASE) 50 MCG/ACT nasal spray Place 1 spray into both nostrils as needed for allergies or rhinitis.   Glucosamine HCl 1500 MG TABS Take 1,500 mg by mouth daily.   Loperamide-Simethicone 2-125 MG TABS Take 1 tablet by mouth 4 (four) times daily as needed (diarrhea/loose stools).   mesalamine (LIALDA) 1.2 g EC tablet Take 1.2 g by mouth daily.   methenamine (HIPREX) 1 g tablet Take 1 g by mouth 2 (two) times daily with a meal. For UTI Prevention. Last UTI: October 20th, November 5th, and December 28th.  Multiple Vitamin (MULTIVITAMIN WITH MINERALS) TABS tablet Take 1 tablet by mouth daily. One A Day for Women   Omeprazole-Sodium Bicarbonate (ZEGERID) 20-1100 MG CAPS capsule Take 1 capsule by mouth 2 (two) times daily.   OVER THE COUNTER MEDICATION Place 1 drop into both ears as needed. Grapefruit Seed Extract Oil   Polyethyl Glycol-Propyl Glycol (SYSTANE) 0.4-0.3 % SOLN Place 1 drop into both eyes as needed (dry/irritated eyes).   Probiotic Product (PROBIOTIC COLON SUPPORT PO) Take 1 tablet by mouth in the morning and at bedtime.   psyllium (METAMUCIL) 58.6 % powder Take 1 packet by mouth 3 (three) times daily.   rOPINIRole (REQUIP) 0.25 MG tablet Take 1 tablet (0.25 mg total) by mouth 3 (three) times daily. Start with .25 mg and increase by .25 weekly if symptoms not relieved Do not increase to more tham 1 mg   valACYclovir (VALTREX) 500 MG tablet TAKE (1) TABLET DAILY AS NEEDED.   No facility-administered encounter medications on file as of 02/01/2022.    Review of Systems  Constitutional:  Negative for appetite change, chills, fatigue, fever and unexpected weight change.  HENT:  Negative for congestion, dental problem, ear discharge, ear pain, facial  swelling, hearing loss, nosebleeds, postnasal drip, rhinorrhea, sinus pressure, sinus pain, sneezing, sore throat, tinnitus and trouble swallowing.   Eyes:  Negative for pain, discharge, redness, itching and visual disturbance.  Respiratory:  Negative for cough, chest tightness, shortness of breath and wheezing.   Cardiovascular:  Negative for chest pain, palpitations and leg swelling.  Gastrointestinal:  Negative for abdominal distention, abdominal pain, constipation, diarrhea, nausea and vomiting.  Endocrine: Negative for cold intolerance, heat intolerance, polydipsia, polyphagia and polyuria.  Genitourinary:  Positive for dysuria and frequency. Negative for difficulty urinating, flank pain and urgency.  Musculoskeletal:  Negative for arthralgias, back pain, gait problem, joint swelling, myalgias, neck pain and neck stiffness.  Skin:  Negative for color change, pallor, rash and wound.  Neurological:  Negative for dizziness, syncope, speech difficulty, weakness, light-headedness, numbness and headaches.       Reports tremors which are relieved by eating something sweet  Hematological:  Does not bruise/bleed easily.  Psychiatric/Behavioral:  Negative for agitation, behavioral problems, confusion, hallucinations, self-injury, sleep disturbance and suicidal ideas. The patient is not nervous/anxious.     Immunization History  Administered Date(s) Administered   Influenza, High Dose Seasonal PF 03/26/2019   Moderna Sars-Covid-2 Vaccination 06/16/2019, 07/14/2019, 04/26/2020   Pneumococcal Conjugate-13 12/12/2014   Pneumococcal Polysaccharide-23 11/20/2019   Tdap 04/08/2008, 11/20/2019   Zoster Recombinat (Shingrix) 06/12/2017   Pertinent  Health Maintenance Due  Topic Date Due   INFLUENZA VACCINE  01/10/2022   DEXA SCAN  Completed      02/17/2021    9:34 AM 05/18/2021   10:58 AM 12/11/2021    1:47 AM 01/04/2022    2:07 PM 02/01/2022   12:37 PM  Fall Risk  Falls in the past year? 0 0  1 1   Was there an injury with Fall? 0 0  1 1  Fall Risk Category Calculator 0 0  2 2  Fall Risk Category Low Low  Moderate Moderate  Patient Fall Risk Level Low fall risk Low fall risk Low fall risk Moderate fall risk Moderate fall risk  Patient at Risk for Falls Due to No Fall Risks History of fall(s)  History of fall(s);Impaired balance/gait History of fall(s)  Fall risk Follow up Falls evaluation completed Falls evaluation completed;Education provided;Falls prevention discussed  Falls evaluation completed;Education provided;Falls  prevention discussed Falls evaluation completed   Functional Status Survey:    Vitals:   02/01/22 1230  BP: (!) 142/90  Pulse: 74  Temp: (!) 97.1 F (36.2 C)  TempSrc: Temporal  Weight: 140 lb (63.5 kg)  Height: '5\' 2"'$  (1.575 m)   Body mass index is 25.61 kg/m. Physical Exam Vitals reviewed.  Constitutional:      General: She is not in acute distress.    Appearance: Normal appearance. She is overweight. She is not ill-appearing or diaphoretic.  HENT:     Head: Normocephalic.     Right Ear: Tympanic membrane, ear canal and external ear normal. There is no impacted cerumen.     Left Ear: Tympanic membrane, ear canal and external ear normal. There is no impacted cerumen.     Nose: Nose normal. No congestion or rhinorrhea.     Mouth/Throat:     Mouth: Mucous membranes are moist.     Pharynx: Oropharynx is clear. No oropharyngeal exudate or posterior oropharyngeal erythema.  Eyes:     General: No scleral icterus.       Right eye: No discharge.        Left eye: No discharge.     Extraocular Movements: Extraocular movements intact.     Conjunctiva/sclera: Conjunctivae normal.     Pupils: Pupils are equal, round, and reactive to light.  Neck:     Vascular: No carotid bruit.  Cardiovascular:     Rate and Rhythm: Normal rate and regular rhythm.     Pulses: Normal pulses.     Heart sounds: Normal heart sounds. No murmur heard.    No friction rub. No  gallop.  Pulmonary:     Effort: Pulmonary effort is normal. No respiratory distress.     Breath sounds: Normal breath sounds. No wheezing, rhonchi or rales.  Chest:     Chest wall: No tenderness.  Abdominal:     General: Bowel sounds are normal. There is no distension.     Palpations: Abdomen is soft. There is no mass.     Tenderness: There is no abdominal tenderness. There is no right CVA tenderness, left CVA tenderness, guarding or rebound.  Musculoskeletal:        General: No swelling or tenderness. Normal range of motion.     Cervical back: Normal range of motion. No rigidity or tenderness.     Right lower leg: No edema.     Left lower leg: No edema.  Lymphadenopathy:     Cervical: No cervical adenopathy.  Skin:    General: Skin is warm and dry.     Coloration: Skin is not pale.     Findings: No bruising, erythema, lesion or rash.  Neurological:     Mental Status: She is alert and oriented to person, place, and time.     Cranial Nerves: No cranial nerve deficit.     Sensory: No sensory deficit.     Motor: No weakness.     Coordination: Coordination normal.     Gait: Gait abnormal.     Comments: Essential tremors noted  Psychiatric:        Mood and Affect: Mood normal.        Speech: Speech normal.        Behavior: Behavior normal.     Labs reviewed: Recent Labs    05/18/21 1155 07/07/21 1012 01/04/22 1500  NA 140 141 139  K 4.5 4.4 5.2  CL 101 101 100  CO2 26 25 29  GLUCOSE 76 101* 90  BUN '17 11 12  '$ CREATININE 0.87 0.80 0.97  CALCIUM 9.6 9.2 10.2   Recent Labs    01/04/22 1500  AST 24  ALT 14  BILITOT 0.4  PROT 7.3   Recent Labs    05/18/21 1155 01/04/22 1500  WBC 6.8 7.7  NEUTROABS  --  5,305  HGB 12.7 13.0  HCT 38.9 39.8  MCV 98.7 98.3  PLT 291 272   Lab Results  Component Value Date   TSH 2.490 07/07/2021   No results found for: "HGBA1C" Lab Results  Component Value Date   CHOL 234 (H) 10/18/2021   HDL 82 10/18/2021   LDLCALC 124  (H) 10/18/2021   TRIG 163 (H) 10/18/2021   CHOLHDL 2.9 10/18/2021    Significant Diagnostic Results in last 30 days:  CT ABDOMEN PELVIS W CONTRAST  Result Date: 01/17/2022 CLINICAL DATA:  Follow-up left lower quadrant cystic lesion. Hiatal hernia. Diverticulosis. EXAM: CT ABDOMEN AND PELVIS WITH CONTRAST TECHNIQUE: Multidetector CT imaging of the abdomen and pelvis was performed using the standard protocol following bolus administration of intravenous contrast. RADIATION DOSE REDUCTION: This exam was performed according to the departmental dose-optimization program which includes automated exposure control, adjustment of the mA and/or kV according to patient size and/or use of iterative reconstruction technique. CONTRAST:  40m ISOVUE-370 IOPAMIDOL (ISOVUE-370) INJECTION 76% COMPARISON:  07/04/2021 FINDINGS: Lower Chest: No acute findings. Hepatobiliary: No hepatic masses identified. Gallbladder is unremarkable. No evidence of biliary ductal dilatation. Pancreas:  No mass or inflammatory changes. Spleen: Within normal limits in size and appearance. Adrenals/Urinary Tract: No masses identified. No evidence of ureteral calculi or hydronephrosis. Stomach/Bowel: Small to moderate hiatal hernia remains stable. No evidence of obstruction, inflammatory process or abnormal fluid collections. Diverticulosis is seen mainly involving the sigmoid colon, however there is no evidence of diverticulitis. Vascular/Lymphatic: No pathologically enlarged lymph nodes. No acute vascular findings. Aortic atherosclerotic calcification incidentally noted. Reproductive: Prior hysterectomy noted. Adnexal regions are unremarkable in appearance. Pelvic floor laxity again noted with small cystocele. Other: Previously seen cystic lesion in the left lower quadrant adjacent to the psoas muscle has resolved since previous study. Musculoskeletal: No suspicious bone lesions identified. Lumbar spine fusion hardware again noted. IMPRESSION:  Resolution of left lower quadrant cystic lesion since prior study. No acute findings. Stable small to moderate hiatal hernia. Prior hysterectomy.  Pelvic floor laxity with small cystocele noted. Colonic diverticulosis, without radiographic evidence of diverticulitis. Aortic Atherosclerosis (ICD10-I70.0). Electronically Signed   By: JMarlaine HindM.D.   On: 01/17/2022 14:37   DG Ribs Unilateral Left  Result Date: 01/06/2022 CLINICAL DATA:  Acute fell 1 month ago in bathroom striking posterior lower LEFT ribs on bathtub, LEFT rib cage pain post fall EXAM: LEFT RIBS - 2 VIEW COMPARISON:  Chest radiograph 08/16/2020 FINDINGS: Normal heart size, mediastinal contours, and pulmonary vascularity. Atherosclerotic calcification aorta. Lungs clear. No pulmonary infiltrate, pleural effusion, or pneumothorax. Prior lumbar fusion L2-S1. Osseous demineralization. No rib fracture or bone destruction. IMPRESSION: No acute osseous abnormalities. Aortic Atherosclerosis (ICD10-I70.0). Electronically Signed   By: MLavonia DanaM.D.   On: 01/06/2022 08:34    Assessment/Plan  1. Dysuria Afebrile - POC Urinalysis Dipstick results indicated yellow clear urine with trace blood but negative for nitrites and leukocytes.  We will send urine for culture.  Made aware results will be back in 3 days Notify provider if symptoms worsen or running any fever or chills - Culture, Urine  2. Tremors of nervous system Suspect  possible side effects from Celexa.  We will gradually reduce and follow-up in 1 month -Encouraged bedtime snack  3. Depression, recurrent ([place]) Mood stable Wean off Celexa due to tremors suspect side effects from Celexa.  We will follow-up in 1 month then start on none SSRI if depression symptoms persist - citalopram (CELEXA) 10 MG tablet; Take one tablet by mouth every other day x 2 weeks then  Take half tablet by mouth every other day x 2 weeks then stop.  Dispense: 30 tablet; Refill: 3  Family/ staff  Communication: Reviewed plan of care with patient  Labs/tests ordered:  - POC Urinalysis Dipstick  - Urine Culture  Next Appointment: Return in about 1 month (around 03/04/2022), or if symptoms worsen or fail to improve, for Depression .   Sandrea Hughs, NP

## 2022-02-01 NOTE — Patient Instructions (Addendum)
-   Eat a bedtime snack and avoid skipping meals

## 2022-02-04 LAB — URINE CULTURE
MICRO NUMBER:: 13821493
SPECIMEN QUALITY:: ADEQUATE

## 2022-02-06 ENCOUNTER — Other Ambulatory Visit: Payer: Self-pay | Admitting: *Deleted

## 2022-02-06 MED ORDER — SACCHAROMYCES BOULARDII 250 MG PO CAPS: 250.0000 mg | ORAL_CAPSULE | Freq: Two times a day (BID) | ORAL | 0 refills | Status: AC

## 2022-02-06 MED ORDER — NITROFURANTOIN MONOHYD MACRO 100 MG PO CAPS: 100.0000 mg | ORAL_CAPSULE | Freq: Two times a day (BID) | ORAL | 0 refills | Status: AC

## 2022-03-02 ENCOUNTER — Telehealth: Payer: Self-pay

## 2022-03-02 ENCOUNTER — Encounter: Payer: Self-pay | Admitting: Nurse Practitioner

## 2022-03-02 ENCOUNTER — Ambulatory Visit (INDEPENDENT_AMBULATORY_CARE_PROVIDER_SITE_OTHER): Payer: Medicare Other | Admitting: Nurse Practitioner

## 2022-03-02 DIAGNOSIS — Z0000 Encounter for general adult medical examination without abnormal findings: Secondary | ICD-10-CM

## 2022-03-02 DIAGNOSIS — E2839 Other primary ovarian failure: Secondary | ICD-10-CM

## 2022-03-02 DIAGNOSIS — Z23 Encounter for immunization: Secondary | ICD-10-CM

## 2022-03-02 NOTE — Telephone Encounter (Signed)
[name], bohan are scheduled for a virtual visit with your provider today.    Just as we do with appointments in the office, we must obtain your consent to participate.  Your consent will be active for this visit and any virtual visit you may have with one of our providers in the next 365 days.    If you have a MyChart account, I can also send a copy of this consent to you electronically.  All virtual visits are billed to your insurance company just like a traditional visit in the office.  As this is a virtual visit, video technology does not allow for your provider to perform a traditional examination.  This may limit your provider's ability to fully assess your condition.  If your provider identifies any concerns that need to be evaluated in person or the need to arrange testing such as labs, EKG, etc, we will make arrangements to do so.    Although advances in technology are sophisticated, we cannot ensure that it will always work on either your end or our end.  If the connection with a video visit is poor, we may have to switch to a telephone visit.  With either a video or telephone visit, we are not always able to ensure that we have a secure connection.   I need to obtain your verbal consent now.   Are you willing to proceed with your visit today?   Carol Perez has provided verbal consent on 03/02/2022 for a virtual visit (video or telephone).   [name], [facility] 03/02/2022  9:38 AM

## 2022-03-02 NOTE — Addendum Note (Signed)
Addended by: Casimer Leek C on: 03/02/2022 10:45 AM   Modules accepted: Orders

## 2022-03-02 NOTE — Progress Notes (Signed)
Subjective:   Carol Perez is a 81 y.o. female who presents for Medicare Annual (Subsequent) preventive examination.  Review of Systems     Cardiac Risk Factors include: advanced age (>40mn, >>35women)     Objective:    There were no vitals filed for this visit. There is no height or weight on file to calculate BMI.     03/02/2022    9:31 AM 01/04/2022    2:08 PM 12/11/2021    1:46 AM 02/17/2021    9:36 AM 02/08/2021   12:53 PM 08/25/2020   10:27 AM 08/12/2020    5:47 AM  Advanced Directives  Does Patient Have a Medical Advance Directive? Yes Yes Yes Yes No Yes   Type of AParamed[place]of [place] will [place] will;Out of facility DNR (pink MOST or yellow form) HParowanLiving will HWright-Patterson AFBLiving will  HPark ForestLiving will   Does patient want to make changes to medical advance directive? No - Patient declined No - Patient declined No - Patient declined No - Patient declined   No - Patient declined  Copy of [place] Chart? No - copy requested No - copy requested  No - copy requested  No - copy requested   Would patient like information on creating a medical advance directive?     No - Patient declined      Current Medications (verified) Outpatient Encounter Medications as of 03/02/2022  Medication Sig   aspirin EC 81 MG tablet Take 81 mg by mouth 3 (three) times daily.   atorvastatin (LIPITOR) 10 MG tablet Take 1 tablet (10 mg total) by mouth daily.   AZO-CRANBERRY PO Take 1 capsule by mouth as needed (UTI Symptoms).   Bismuth Subsalicylate (PEPTO-BISMOL PO) Take by mouth as needed.   CALCIUM PO Take 1,200 mg by mouth daily.    Cholecalciferol (VITAMIN D3) 50 MCG (2000 UT) TABS Take 2,000 Units by mouth in the morning, at noon, and at bedtime.   conjugated estrogens (PREMARIN) vaginal cream Place 1 applicator vaginally once a week.   denosumab  (PROLIA) 60 MG/ML SOSY injection Inject 60 mg into the skin every 6 (six) months.   Diclofenac Sodium (MOTRIN ARTHRITIS PAIN EX) Apply topically.   fluticasone (FLONASE) 50 MCG/ACT nasal spray Place 1 spray into both nostrils as needed for allergies or rhinitis.   Glucosamine HCl 1500 MG TABS Take 1,500 mg by mouth daily.   Homeopathic Products (SIMILASAN DRY EYE RELIEF OP) Apply to eye.   Loperamide-Simethicone 2-125 MG TABS Take 1 tablet by mouth daily.   mesalamine (LIALDA) 1.2 g EC tablet Take 1.2 g by mouth daily.   methenamine (HIPREX) 1 g tablet Take 1 g by mouth 2 (two) times daily with a meal. For UTI Prevention. Last UTI: October 20th, November 5th, and December 28th.   Multiple Vitamin (MULTIVITAMIN WITH MINERALS) TABS tablet Take 1 tablet by mouth daily. One A Day for Women   Omeprazole-Sodium Bicarbonate (ZEGERID) 20-1100 MG CAPS capsule Take 1 capsule by mouth 2 (two) times daily.   OVER THE COUNTER MEDICATION Place 1 drop into both ears as needed. Grapefruit Seed Extract Oil   Polyethyl Glycol-Propyl Glycol (SYSTANE) 0.4-0.3 % SOLN Place 1 drop into both eyes as needed (dry/irritated eyes).   Probiotic Product (PROBIOTIC COLON SUPPORT PO) Take 1 tablet by mouth in the morning and at bedtime.   psyllium (METAMUCIL) 58.6 % powder Take  1 packet by mouth 3 (three) times daily.   valACYclovir (VALTREX) 500 MG tablet TAKE ONE TABLET BY MOUTH DAILY AS NEEDED   [DISCONTINUED] rOPINIRole (REQUIP) 0.25 MG tablet Take 1 tablet (0.25 mg total) by mouth 3 (three) times daily. Start with .25 mg and increase by .25 weekly if symptoms not relieved Do not increase to more tham 1 mg   [DISCONTINUED] Bilberry, Vaccinium myrtillus, (BILBERRY EXTRACT PO) Take by mouth as needed.   [DISCONTINUED] citalopram (CELEXA) 10 MG tablet Take one tablet by mouth every other day x 2 weeks then  Take half tablet by mouth every other day x 2 weeks then stop.   No facility-administered encounter medications on file  as of 03/02/2022.    Allergies (verified) Banana, Latex, Ciprofloxacin hcl, Gatifloxacin, Levofloxacin, Moxifloxacin, Norfloxacin, Nsaids, Ofloxacin, Other, Penicillins, Methylisothiazolinone, and Tape   History: Past Medical History:  Diagnosis Date   Barrett's esophagus    Colitis    DDD (degenerative disc disease)    Factor 5 Leiden mutation, heterozygous [facility])    Per [place] Patient Packet.   Frequent UTI    H/O echocardiogram 04/01/2019   Per [place] Patient Packet.   Hiatal hernia    Per [place] New Patient Packet.   High grade dysplasia of Barrett's epithelium    Per [facility] New Patient Packet.   History of bladder infections    Per [facility] New Patient Packet.   History of Papanicolaou smear of cervix 10/16/2011   Per [place] Patient Packet   Hypoglycemia    Hypotension    Per [facility] New Patient Packet.   IBS (irritable bowel syndrome)    Per [place] New Patient Packet.   Lactose intolerance    Osteoarthritis    Per [place] New Patient Packet.   Osteopenia    Per [place] New Patient Packet.   Osteoporosis    Premature ventricular complex    Raynaud's disease    Per [place] Patient Packet.   Scoliosis    Per [facility] New Patient Packet.   Past Surgical History:  Procedure Laterality Date   ABDOMINAL EXPOSURE N/A 08/12/2020   Procedure: ABDOMINAL EXPOSURE;  Surgeon: Serafina Mitchell, MD;  Location: [facility] OR;  Service: Vascular;  Laterality: N/A;   ABDOMINAL HYSTERECTOMY  06/13/2011   By Dr.Scherer at [facility]. Per [facility] New Patient Packet.   ANTERIOR LAT LUMBAR FUSION Right 08/12/2020   Procedure: Right Lumbar two-three, Lumbar three-four, Lumbar four-five Anterolateral lumbar interbody fusion;  Surgeon: Erline Levine, MD;  Location: [place];  Service: Neurosurgery;  Laterality: Right;   ANTERIOR LUMBAR FUSION N/A 08/12/2020   Procedure: Lumbar five Sacral one Anterior lumbar interbody fusion;  Surgeon: Erline Levine, MD;  Location: [place];  Service: Neurosurgery;  Laterality: N/A;   BREAST  EXCISIONAL BIOPSY Left 30+ yrs ago   COLONOSCOPY  11/23/2011   By Dr.McCune at [facility]. Per [place] Patient Packet.   COLONOSCOPY  08/26/2019   LUMBAR PERCUTANEOUS PEDICLE SCREW 4 LEVEL N/A 08/12/2020   Procedure: Percutaneous pedicle screw fixation from Lumbar two to Sacral one ;  Surgeon: Erline Levine, MD;  Location: [place];  Service: Neurosurgery;  Laterality: N/A;   MAMMOGRAM  04/14/2019   pap smear  10/16/2011   SIGMOIDOSCOPY  11/02/2014   Per [place] New Patient Packet   TONSILLECTOMY  06/12/1948   Per [facility] New Patient Packet.   Family History  Problem Relation Age of Onset   Cancer Mother    Dementia Mother  Alzheimer's disease Mother    Cancer Father    Alzheimer's disease Maternal Grandmother    Dementia Maternal Grandmother    Alzheimer's disease Maternal Grandfather    Social History   Socioeconomic History   Marital status: Married    Spouse name: Not on file   Number of children: 2   Years of education: Not on file   Highest education level: Associate degree: occupational, Hotel manager, or vocational program  Occupational History   Occupation: Retired   Tobacco Use   Smoking status: Former    Types: Cigarettes    Start date: 06/12/1977   Smokeless tobacco: Never   Tobacco comments:    42 years ago. Smoked from age [age]  Vaping Use   Vaping Use: Never used  Substance and Sexual Activity   Alcohol use: Yes    Alcohol/week: 7.0 standard drinks of alcohol    Types: 7 Shots of liquor per week    Comment: one gin and tonic at night   Drug use: No   Sexual activity: Not on file  Other Topics Concern   Not on file  Social History Narrative   Tobacco use, amount per day now: No   Past tobacco use, amount per day: Smoked ages [age]   How many years did you use tobacco: 17-20 years   Alcohol use (drinks per week): 1 glass of Red Wine per day.   Diet: Good   Do you drink/eat things with caffeine: No   Marital status: Married                                   What year were you married? 1978   Do you live in a house, apartment, assisted living, condo, trailer, etc.? Apartment/Independent Living   Is it one or more stories? 1   How many persons live in your home? 2    Do you have pets in your home?( please list) No   Highest Level of Education completed? 2 years of college.    Current or past profession: Retired Futures trader   Do you exercise?  Yes                                Type and how often? 65mn workout 5 days week. Walking 3-4 Days a Week.    Do you have a living will? Yes   Do you have a DNR form?  Yes                                 If not, do you want to discuss one?   Do you have signed POA/HPOA forms? Yes                       If so, please bring to you appointment      Do you have difficulty bathing or dressing yourself? No   Do you have difficulty preparing food or eating? No   Do you have difficulty managing your medications? No   Do you have difficulty managing your finances? No   Do you have difficulty affording your medications? No       [facility] Abstracted by Jasmine/RMA.    Social Determinants of Health   Financial Resource Strain: Not on  file  Food Insecurity: Not on file  Transportation Needs: Not on file  Physical Activity: Not on file  Stress: Not on file  Social Connections: Not on file    Tobacco Counseling Counseling given: Not Answered Tobacco comments: 42 years ago. Smoked from age [age]   Clinical Intake:  Pre-visit preparation completed: Yes  Pain : No/denies pain     BMI - recorded: 25 Nutritional Status: BMI 25 -29 Overweight Diabetes: No  How often do you need to have someone help you when you read instructions, pamphlets, or other written materials from your doctor or pharmacy?: 1 - Never  [place] of Daily Living    03/02/2022    9:47 AM  In your present state of health, do you have any difficulty performing the  following activities:  Hearing? 0  Vision? 0  Difficulty concentrating or making decisions? 1  Comment some short term memory problems  Walking or climbing stairs? 0  Dressing or bathing? 0  Doing errands, shopping? 0  Preparing Food and eating ? N  Using the Toilet? N  In the past six months, have you accidently leaked urine? Y  Do you have problems with loss of bowel control? Y  Managing your Medications? N  Managing your Finances? N  Housekeeping or managing your Housekeeping? N    Patient Care Team: Wardell Honour, MD as PCP - General (Family Medicine) Rozetta Nunnery, MD (Inactive) as Consulting Physician (Otolaryngology) Luberta Mutter, MD as Consulting Physician (Ophthalmology)  Indicate any recent Medical Services you may have received from other than [facility] providers in the past year (date may be approximate).     Assessment:   This is a routine wellness examination for Kyrsten.  Hearing/Vision screen Hearing Screening - Comments:: Patient has some hearing problems and needs to have it checked.  Dietary issues and exercise activities discussed: Current Exercise Habits: Home exercise routine, Type of exercise: calisthenics;strength training/weights, Time (Minutes): 45, Frequency (Times/Week): 3, Weekly Exercise (Minutes/Week): 135   Goals Addressed   None   Depression Screen    03/02/2022    9:28 AM 02/17/2021    9:32 AM  PHQ 2/9 Scores  PHQ - 2 Score 0 0    Fall Risk    03/02/2022    9:29 AM 02/01/2022   12:37 PM 01/04/2022    2:07 PM 05/18/2021   10:58 AM 02/17/2021    9:34 AM  Fall Risk   Falls in the past year? '1 1 1 '$ 0 0  Number falls in past yr: 1 0 0 0 0  Injury with Fall? '1 1 1 '$ 0 0  Risk for fall due to : History of fall(s) History of fall(s) History of fall(s);Impaired balance/gait History of fall(s) No Fall Risks  Follow up Falls evaluation completed Falls evaluation completed Falls evaluation completed;Education provided;Falls prevention  discussed Falls evaluation completed;Education provided;Falls prevention discussed Falls evaluation completed    FALL RISK PREVENTION PERTAINING TO THE HOME:  Any stairs in or around the home? No  If so, are there any without handrails?  na Home free of loose throw rugs in walkways, pet beds, electrical cords, etc? Yes  Adequate lighting in your home to reduce risk of falls? Yes   ASSISTIVE DEVICES UTILIZED TO PREVENT FALLS:  Life alert? No  Use of a cane, walker or w/c? Yes  Grab bars in the bathroom? Yes  Shower chair or bench in shower? Yes  Elevated toilet  seat or a handicapped toilet? Yes   TIMED UP AND GO:  Was the test performed? No .    Cognitive Function:    01/04/2022    2:48 PM  MMSE - Mini Mental State Exam  Orientation to time 5  Orientation to Place 4  Registration 3  Attention/ Calculation 5  Recall 0  Language- name 2 objects 2  Language- repeat 1  Language- follow 3 step command 3  Language- read & follow direction 1  Write a sentence 1  Copy design 1  Total score 26        03/02/2022    9:32 AM 02/17/2021    9:37 AM  6CIT Screen  What Year? 0 points 0 points  What month? 0 points 0 points  What time? 0 points 0 points  Count back from 20 0 points 0 points  Months in reverse 0 points 0 points  Repeat phrase 4 points 0 points  Total Score 4 points 0 points    Immunizations Immunization History  Administered Date(s) Administered   Influenza, High Dose Seasonal PF 03/26/2019   Moderna Sars-Covid-2 Vaccination 06/16/2019, 07/14/2019, 04/26/2020   Pneumococcal Conjugate-13 12/12/2014   Pneumococcal Polysaccharide-23 11/20/2019   Tdap 04/08/2008, 11/20/2019   Zoster Recombinat (Shingrix) 06/12/2017    TDAP status: Up to date  Flu Vaccine status: Completed at today's visit  Pneumococcal vaccine status: Up to date  Covid-19 vaccine status: Information provided on how to obtain vaccines.   Qualifies for Shingles Vaccine? Yes   Zostavax  completed No   Shingrix Completed?: No.    Education has been provided regarding the importance of this vaccine. Patient has been advised to call insurance company to determine out of pocket expense if they have not yet received this vaccine. Advised may also receive vaccine at local pharmacy or Health Dept. Verbalized acceptance and understanding.  Screening Tests Health Maintenance  Topic Date Due   Hepatitis C Screening  Never done   Zoster Vaccines- Shingrix (2 of 2) 08/07/2017   COVID-19 Vaccine (4 - Moderna series) 06/21/2020   INFLUENZA VACCINE  01/10/2022   TETANUS/TDAP  11/19/2029   Pneumonia Vaccine 47+ Years old  Completed   DEXA SCAN  Completed   HPV VACCINES  Aged Out    Health Maintenance  Health Maintenance Due  Topic Date Due   Hepatitis C Screening  Never done   Zoster Vaccines- Shingrix (2 of 2) 08/07/2017   COVID-19 Vaccine (4 - Moderna series) 06/21/2020   INFLUENZA VACCINE  01/10/2022    Colorectal cancer screening: No longer required.   Mammogram status: No longer required due to age.  Bone Density status: Completed 2021. Results reflect: Bone density results: OSTEOPENIA. Repeat every 2 years.  Lung Cancer Screening: (Low Dose CT Chest recommended if Age 59-80 years, 30 pack-year currently smoking OR have quit w/in 15years.) does not qualify.   Lung Cancer Screening Referral: na  Additional Screening:  Hepatitis C Screening: does qualify; Completed with next lab  Vision Screening: Recommended annual ophthalmology exams for early detection of glaucoma and other disorders of the eye. Is the patient up to date with their annual eye exam?  Yes  Who is the provider or what is the name of the office in which the patient attends annual eye exams?  If pt is not established with a provider, would they like to be referred to a provider to establish care? No .   Dental Screening: Recommended annual dental exams for proper oral hygiene  Community Resource  Referral / Chronic Care Management: CRR required this visit?  No   CCM required this visit?  No      Plan:     I have personally reviewed and noted the following in the patient's chart:   Medical and social history Use of alcohol, tobacco or illicit drugs  Current medications and supplements including opioid prescriptions. Patient is not currently taking opioid prescriptions. Functional ability and status Nutritional status Physical activity Advanced directives List of other physicians Hospitalizations, surgeries, and ER visits in previous 12 months Vitals Screenings to include cognitive, depression, and falls Referrals and appointments  In addition, I have reviewed and discussed with patient certain preventive protocols, quality metrics, and best practice recommendations. A written personalized care plan for preventive services as well as general preventive health recommendations were provided to patient.     Lauree Chandler, NP   03/02/2022    Virtual Visit via Telephone Note  I connected with patient 03/02/22 at  9:20 AM EDT by telephone and verified that I am speaking with the correct person using two identifiers.  Location: Patient: office Provider: [facility]   I discussed the limitations, risks, security and privacy concerns of performing an evaluation and management service by telephone and the availability of in person appointments. I also discussed with the patient that there may be a patient responsible charge related to this service. The patient expressed understanding and agreed to proceed.   I discussed the assessment and treatment plan with the patient. The patient was provided an opportunity to ask questions and all were answered. The patient agreed with the plan and demonstrated an understanding of the instructions.   The patient was advised to call back or seek an in-person evaluation if the symptoms worsen or if the condition fails to improve as  anticipated.  I provided 15 minutes of non-face-to-face time during this encounter.  Carlos American. Harle Battiest Avs printed and mailed

## 2022-03-02 NOTE — Progress Notes (Signed)
This service is provided via telemedicine  No vital signs collected/recorded due to the encounter was a telemedicine visit.   Location of patient (ex: home, work):  Home  Patient consents to a telephone visit:  Yes, see encounter dated 03/02/2022  Location of the provider (ex: office, home):  [place]  Name of any referring provider:  Alain Honey, MD  Names of all persons participating in the telemedicine service and their role in the encounter:  Sherrie Mustache, Nurse Practitioner, [name], CMA, and patient.   Time spent on call:  12 minutes with medical assistant

## 2022-03-02 NOTE — Patient Instructions (Signed)
[name] , Thank you for taking time to come for your Medicare Wellness Visit. I appreciate your ongoing commitment to your health goals. Please review the following plan we discussed and let me know if I can assist you in the future.   Screening recommendations/referrals: Colonoscopy aged out Mammogram aged out  Bone Density  to call [phone] Recommended yearly ophthalmology/optometry visit for glaucoma screening and checkup Recommended yearly dental visit for hygiene and checkup  Vaccinations: Influenza vaccine- due annually in September/October Pneumococcal vaccine up to date Tdap vaccine up to date Shingles vaccine to get 2nd vaccine at the pharmacy    Advanced directives: on file.   Conditions/risks identified: fall risk,   Next appointment: yearly in person   Preventive Care 39 Years and Older, Female Preventive care refers to lifestyle choices and visits with your health care provider that can promote health and wellness. What does preventive care include? A yearly physical exam. This is also called an annual well check. Dental exams once or twice a year. Routine eye exams. Ask your health care provider how often you should have your eyes checked. Personal lifestyle choices, including: Daily care of your teeth and gums. Regular physical activity. Eating a healthy diet. Avoiding tobacco and drug use. Limiting alcohol use. Practicing safe sex. Taking low-dose aspirin every day. Taking vitamin and mineral supplements as recommended by your health care provider. What happens during an annual well check? The services and screenings done by your health care provider during your annual well check will depend on your age, overall health, lifestyle risk factors, and family history of disease. Counseling  Your health care provider may ask you questions about your: Alcohol use. Tobacco use. Drug use. Emotional well-being. Home and relationship well-being. Sexual  activity. Eating habits. History of falls. Memory and ability to understand (cognition). Work and work Statistician. Reproductive health. Screening  You may have the following tests or measurements: Height, weight, and BMI. Blood pressure. Lipid and cholesterol levels. These may be checked every 5 years, or more frequently if you are over 48 years old. Skin check. Lung cancer screening. You may have this screening every year starting at age 91 if you have a 30-pack-year history of smoking and currently smoke or have quit within the past 15 years. Fecal occult blood test (FOBT) of the stool. You may have this test every year starting at age 80. Flexible sigmoidoscopy or colonoscopy. You may have a sigmoidoscopy every 5 years or a colonoscopy every 10 years starting at age 71. Hepatitis C blood test. Hepatitis B blood test. Sexually transmitted disease (STD) testing. Diabetes screening. This is done by checking your blood sugar (glucose) after you have not eaten for a while (fasting). You may have this done every 1-3 years. Bone density scan. This is done to screen for osteoporosis. You may have this done starting at age 66. Mammogram. This may be done every 1-2 years. Talk to your health care provider about how often you should have regular mammograms. Talk with your health care provider about your test results, treatment options, and if necessary, the need for more tests. Vaccines  Your health care provider may recommend certain vaccines, such as: Influenza vaccine. This is recommended every year. Tetanus, diphtheria, and acellular pertussis (Tdap, Td) vaccine. You may need a Td booster every 10 years. Zoster vaccine. You may need this after age 59. Pneumococcal 13-valent conjugate (PCV13) vaccine. One dose is recommended after age 47. Pneumococcal polysaccharide (PPSV23) vaccine. One dose is recommended after  age 25. Talk to your health care provider about which screenings and vaccines  you need and how often you need them. This information is not intended to replace advice given to you by your health care provider. Make sure you discuss any questions you have with your health care provider. Document Released: 06/25/2015 Document Revised: 02/16/2016 Document Reviewed: 03/30/2015 Elsevier Interactive Patient Education  2017 [employer] Prevention in the Home Falls can cause injuries. They can happen to people of all ages. There are many things you can do to make your home safe and to help prevent falls. What can I do on the outside of my home? Regularly fix the edges of walkways and driveways and fix any cracks. Remove anything that might make you trip as you walk through a door, such as a raised step or threshold. Trim any bushes or trees on the path to your home. Use bright outdoor lighting. Clear any walking paths of anything that might make someone trip, such as rocks or tools. Regularly check to see if handrails are loose or broken. Make sure that both sides of any steps have handrails. Any raised decks and porches should have guardrails on the edges. Have any leaves, snow, or ice cleared regularly. Use sand or salt on walking paths during winter. Clean up any spills in your garage right away. This includes oil or grease spills. What can I do in the bathroom? Use night lights. Install grab bars by the toilet and in the tub and shower. Do not use towel bars as grab bars. Use non-skid mats or decals in the tub or shower. If you need to sit down in the shower, use a plastic, non-slip stool. Keep the floor dry. Clean up any water that spills on the floor as soon as it happens. Remove soap buildup in the tub or shower regularly. Attach bath mats securely with double-sided non-slip rug tape. Do not have throw rugs and other things on the floor that can make you trip. What can I do in the bedroom? Use night lights. Make sure that you have a light by your bed that  is easy to reach. Do not use any sheets or blankets that are too big for your bed. They should not hang down onto the floor. Have a firm chair that has side arms. You can use this for support while you get dressed. Do not have throw rugs and other things on the floor that can make you trip. What can I do in the kitchen? Clean up any spills right away. Avoid walking on wet floors. Keep items that you use a lot in easy-to-reach places. If you need to reach something above you, use a strong step stool that has a grab bar. Keep electrical cords out of the way. Do not use floor polish or wax that makes floors slippery. If you must use wax, use non-skid floor wax. Do not have throw rugs and other things on the floor that can make you trip. What can I do with my stairs? Do not leave any items on the stairs. Make sure that there are handrails on both sides of the stairs and use them. Fix handrails that are broken or loose. Make sure that handrails are as long as the stairways. Check any carpeting to make sure that it is firmly attached to the stairs. Fix any carpet that is loose or worn. Avoid having throw rugs at the top or bottom of the stairs. If you do  have throw rugs, attach them to the floor with carpet tape. Make sure that you have a light switch at the top of the stairs and the bottom of the stairs. If you do not have them, ask someone to add them for you. What else can I do to help prevent falls? Wear shoes that: Do not have high heels. Have rubber bottoms. Are comfortable and fit you well. Are closed at the toe. Do not wear sandals. If you use a stepladder: Make sure that it is fully opened. Do not climb a closed stepladder. Make sure that both sides of the stepladder are locked into place. Ask someone to hold it for you, if possible. Clearly mark and make sure that you can see: Any grab bars or handrails. First and last steps. Where the edge of each step is. Use tools that help you  move around (mobility aids) if they are needed. These include: Canes. Walkers. Scooters. Crutches. Turn on the lights when you go into a dark area. Replace any light bulbs as soon as they burn out. Set up your furniture so you have a clear path. Avoid moving your furniture around. If any of your floors are uneven, fix them. If there are any pets around you, be aware of where they are. Review your medicines with your doctor. Some medicines can make you feel dizzy. This can increase your chance of falling. Ask your doctor what other things that you can do to help prevent falls. This information is not intended to replace advice given to you by your health care provider. Make sure you discuss any questions you have with your health care provider. Document Released: 03/25/2009 Document Revised: 11/04/2015 Document Reviewed: 07/03/2014 Elsevier Interactive Patient Education  2017 [employer].

## 2022-03-06 ENCOUNTER — Ambulatory Visit (INDEPENDENT_AMBULATORY_CARE_PROVIDER_SITE_OTHER): Payer: Medicare Other | Admitting: Family

## 2022-03-06 ENCOUNTER — Other Ambulatory Visit: Payer: Self-pay

## 2022-03-06 ENCOUNTER — Encounter: Payer: Self-pay | Admitting: Family

## 2022-03-06 VITALS — BP 130/78 | Temp 98.0°F | Resp 18 | Ht 62.0 in | Wt 145.0 lb

## 2022-03-06 DIAGNOSIS — F339 Major depressive disorder, recurrent, unspecified: Secondary | ICD-10-CM

## 2022-03-06 DIAGNOSIS — R251 Tremor, unspecified: Secondary | ICD-10-CM | POA: Diagnosis not present

## 2022-03-06 DIAGNOSIS — R413 Other amnesia: Secondary | ICD-10-CM

## 2022-03-06 DIAGNOSIS — N644 Mastodynia: Secondary | ICD-10-CM

## 2022-03-06 DIAGNOSIS — E2839 Other primary ovarian failure: Secondary | ICD-10-CM

## 2022-03-06 MED ORDER — BUPROPION HCL ER (XL) 150 MG PO TB24: 150.0000 mg | ORAL_TABLET | Freq: Every day | ORAL | 3 refills | Status: DC

## 2022-03-06 NOTE — Progress Notes (Signed)
Provider: Vyron Fronczak FNP-C  Wardell Honour, MD  Patient Care Team: Wardell Honour, MD as PCP - General (Family Medicine) Rozetta Nunnery, MD (Inactive) as Consulting Physician (Otolaryngology) Luberta Mutter, MD as Consulting Physician (Ophthalmology)  Extended Emergency Contact Information Primary Emergency Contact: Showman,William M Address: [address] [place]          [place], [place] [address] Johnnette Litter of [place] Phone: [phone] Mobile Phone: [phone] Relation: Spouse Secondary Emergency Contact: Reeves,Pam    POA Address: [place], [place] [address] [address]itter of [place] Phone: [phone] Relation: Niece Preferred language: English Interpreter needed? No  Code Status: Full Code  Goals of care: Advanced Directive information    03/02/2022    9:31 AM  Advanced Directives  Does Patient Have a Medical Advance Directive? Yes  Type of Paramedic of [place];Living will  Does patient want to make changes to medical advance directive? No - Patient declined  Copy of [place] in Chart? No - copy requested     Chief Complaint  Patient presents with   Follow-up    Patient is here for a 1 month F/U after dysuria,tremors, and depression    HPI:  Pt is a 81 y.o. female seen today for an acute visit for follow up depression and Tremors.she was here 02/01/2022 with complains of tremors since she was started on Celexa by Dr.Miller 10/18/2021.Had a visit with me one month ago and was advised to wean off Celexa then stop.she has stopped Celexa.states tremors have resolved.Not feeling depressed but Husband thinks she needs to get back on something to help her.    Also state has been using motrin and Fibro topical cream at bedtime which has helped with her neuropathy pain and leg cramps at night. Does exercise on a stationary Bike twice a week.Walking tends to  worsen cramps at night.    Past Medical History:  Diagnosis Date   Barrett's esophagus    Colitis    DDD (degenerative disc disease)    Factor 5 Leiden mutation, heterozygous [facility])    Per [place] New Patient Packet.   Frequent UTI    H/O echocardiogram 04/01/2019   Per [place] Patient Packet.   Hiatal hernia    Per [place] New Patient Packet.   High grade dysplasia of Barrett's epithelium    Per [facility] New Patient Packet.   History of bladder infections    Per [facility] New Patient Packet.   History of Papanicolaou smear of cervix 10/16/2011   Per [place] Patient Packet   Hypoglycemia    Hypotension    Per [facility] New Patient Packet.   IBS (irritable bowel syndrome)    Per [place] New Patient Packet.   Lactose intolerance    Osteoarthritis    Per [place] New Patient Packet.   Osteopenia    Per [place] New Patient Packet.   Osteoporosis    Premature ventricular complex    Raynaud's disease    Per [place] Patient Packet.   Scoliosis    Per [facility] New Patient Packet.   Past Surgical History:  Procedure Laterality Date   ABDOMINAL EXPOSURE N/A 08/12/2020   Procedure: ABDOMINAL EXPOSURE;  Surgeon: Serafina Mitchell, MD;  [facility] OR;  Service: Vascular;  Laterality: N/A;   ABDOMINAL HYSTERECTOMY  06/13/2011   By Dr.Scherer at [facility]. Per [facility] New Patient Packet.  ANTERIOR LAT LUMBAR FUSION Right 08/12/2020   Procedure: Right Lumbar two-three, Lumbar three-four, Lumbar four-five Anterolateral lumbar interbody fusion;  Surgeon: Erline Levine, MD;  Location: [place];  Service: Neurosurgery;  Laterality: Right;   ANTERIOR LUMBAR FUSION N/A 08/12/2020   Procedure: Lumbar five Sacral one Anterior lumbar interbody fusion;  Surgeon: Erline Levine, MD;  Location: [place];  Service: Neurosurgery;  Laterality: N/A;   BREAST EXCISIONAL BIOPSY Left 30+ yrs ago   COLONOSCOPY  11/23/2011   By Dr.McCune at [facility]. Per [place] Patient Packet.   COLONOSCOPY  08/26/2019   LUMBAR PERCUTANEOUS PEDICLE  SCREW 4 LEVEL N/A 08/12/2020   Procedure: Percutaneous pedicle screw fixation from Lumbar two to Sacral one ;  Surgeon: Erline Levine, MD;  Location: [place];  Service: Neurosurgery;  Laterality: N/A;   MAMMOGRAM  04/14/2019   pap smear  10/16/2011   SIGMOIDOSCOPY  11/02/2014   Per [place] New Patient Packet   TONSILLECTOMY  06/12/1948   Per [facility] New Patient Packet.    Allergies  Allergen Reactions   Banana Swelling    Swelling around mouth and eyes and red blotches   Latex Swelling, Rash and Other (See Comments)   Ciprofloxacin Hcl Other (See Comments)    Severe knee inflammation    Gatifloxacin Other (See Comments)    Severe knee inflammation    Levofloxacin Other (See Comments)    Severe knee inflammation    Moxifloxacin Other (See Comments)    Severe knee inflammation    Norfloxacin Other (See Comments)    Severe knee inflammation    Nsaids Other (See Comments)    Upsets ulcers   Ofloxacin Other (See Comments)    Severe knee inflammation   Other Other (See Comments)    FLOXIN   Penicillins Hives    Tolerates (KEFLEX-cephalexin)    Methylisothiazolinone Rash and Other (See Comments)    A preservative found in a cream pt used   Tape Rash    Outpatient Encounter Medications as of 03/06/2022  Medication Sig   aspirin EC 81 MG tablet Take 81 mg by mouth 3 (three) times daily.   atorvastatin (LIPITOR) 10 MG tablet Take 1 tablet (10 mg total) by mouth daily.   AZO-CRANBERRY PO Take 1 capsule by mouth as needed (UTI Symptoms).   Bismuth Subsalicylate (PEPTO-BISMOL PO) Take by mouth as needed.   CALCIUM PO Take 1,200 mg by mouth daily.    Cholecalciferol (VITAMIN D3) 50 MCG (2000 UT) TABS Take 2,000 Units by mouth in the morning, at noon, and at bedtime.   conjugated estrogens (PREMARIN) vaginal cream Place 1 applicator vaginally once a week.   denosumab (PROLIA) 60 MG/ML SOSY injection Inject 60 mg into the skin every 6 (six) months.   Diclofenac Sodium (MOTRIN ARTHRITIS PAIN  EX) Apply topically.   fluticasone (FLONASE) 50 MCG/ACT nasal spray Place 1 spray into both nostrils as needed for allergies or rhinitis.   Glucosamine HCl 1500 MG TABS Take 1,500 mg by mouth daily.   Homeopathic Products (SIMILASAN DRY EYE RELIEF OP) Apply to eye.   Loperamide-Simethicone 2-125 MG TABS Take 1 tablet by mouth daily.   mesalamine (LIALDA) 1.2 g EC tablet Take 1.2 g by mouth daily.   methenamine (HIPREX) 1 g tablet Take 1 g by mouth 2 (two) times daily with a meal. For UTI Prevention. Last UTI: October 20th, November 5th, and December 28th.   Multiple Vitamin (MULTIVITAMIN WITH MINERALS) TABS tablet Take 1 tablet by mouth daily.  One A Day for Women   Omeprazole-Sodium Bicarbonate (ZEGERID) 20-1100 MG CAPS capsule Take 1 capsule by mouth 2 (two) times daily.   OVER THE COUNTER MEDICATION Place 1 drop into both ears as needed. Grapefruit Seed Extract Oil   Polyethyl Glycol-Propyl Glycol (SYSTANE) 0.4-0.3 % SOLN Place 1 drop into both eyes as needed (dry/irritated eyes).   Probiotic Product (PROBIOTIC COLON SUPPORT PO) Take 1 tablet by mouth in the morning and at bedtime.   psyllium (METAMUCIL) 58.6 % powder Take 1 packet by mouth 3 (three) times daily.   valACYclovir (VALTREX) 500 MG tablet TAKE ONE TABLET BY MOUTH DAILY AS NEEDED   No facility-administered encounter medications on file as of 03/06/2022.    Review of Systems  Constitutional:  Negative for appetite change, chills, fatigue, fever and unexpected weight change.  HENT:  Negative for congestion, dental problem, ear discharge, ear pain, facial swelling, hearing loss, nosebleeds, postnasal drip, rhinorrhea, sinus pressure, sinus pain, sneezing, sore throat, tinnitus and trouble swallowing.   Eyes:  Negative for pain, discharge, redness, itching and visual disturbance.  Respiratory:  Negative for cough, chest tightness, shortness of breath and wheezing.   Cardiovascular:  Negative for chest pain, palpitations and leg  swelling.  Gastrointestinal:  Negative for abdominal distention, abdominal pain, blood in stool, constipation, diarrhea, nausea and vomiting.  Endocrine: Negative for cold intolerance, heat intolerance, polydipsia, polyphagia and polyuria.  Musculoskeletal:  Negative for arthralgias, back pain, gait problem, joint swelling and myalgias.  Skin:  Negative for color change, pallor, rash and wound.  Neurological:  Negative for dizziness, syncope, speech difficulty, weakness, light-headedness, numbness and headaches.       Tremors have resolved with stopping Celexa   Hematological:  Does not bruise/bleed easily.  Psychiatric/Behavioral:  Negative for agitation, behavioral problems, confusion, hallucinations, self-injury, sleep disturbance and suicidal ideas. The patient is not nervous/anxious.        Depression  Memory loss following up with Neuro     Immunization History  Administered Date(s) Administered   Fluad Quad(high Dose 65+) 03/02/2022   Influenza, High Dose Seasonal PF 03/26/2019   Moderna Sars-Covid-2 Vaccination 06/16/2019, 07/14/2019, 04/26/2020   Pneumococcal Conjugate-13 12/12/2014   Pneumococcal Polysaccharide-23 11/20/2019   Tdap 04/08/2008, 11/20/2019   Zoster Recombinat (Shingrix) 06/12/2017   Pertinent  Health Maintenance Due  Topic Date Due   INFLUENZA VACCINE  Completed   DEXA SCAN  Completed      05/18/2021   10:58 AM 12/11/2021    1:47 AM 01/04/2022    2:07 PM 02/01/2022   12:37 PM 03/02/2022    9:29 AM  Fall Risk  Falls in the past year? 0  '1 1 1  '$ Was there an injury with Fall? 0  '1 1 1  '$ Fall Risk Category Calculator 0  '2 2 3  '$ Fall Risk Category Low  Moderate Moderate High  Patient Fall Risk Level Low fall risk Low fall risk Moderate fall risk Moderate fall risk High fall risk  Patient at Risk for Falls Due to History of fall(s)  History of fall(s);Impaired balance/gait History of fall(s) History of fall(s)  Fall risk Follow up Falls evaluation  completed;Education provided;Falls prevention discussed  Falls evaluation completed;Education provided;Falls prevention discussed Falls evaluation completed Falls evaluation completed   Functional Status Survey:    Vitals:   03/06/22 1100  BP: 130/78  Resp: 18  Temp: 98 F (36.7 C)  TempSrc: Temporal  Weight: 145 lb (65.8 kg)  Height: '5\' 2"'$  (1.575 m)   Body  mass index is 26.52 kg/m. Physical Exam Vitals reviewed.  Constitutional:      General: She is not in acute distress.    Appearance: Normal appearance. She is overweight. She is not ill-appearing or diaphoretic.  HENT:     Head: Normocephalic.  Eyes:     General: No scleral icterus.       Right eye: No discharge.        Left eye: No discharge.     Conjunctiva/sclera: Conjunctivae normal.     Pupils: Pupils are equal, round, and reactive to light.  Neck:     Vascular: No carotid bruit.  Cardiovascular:     Rate and Rhythm: Normal rate and regular rhythm.     Pulses: Normal pulses.     Heart sounds: Normal heart sounds. No murmur heard.    No friction rub. No gallop.  Pulmonary:     Effort: Pulmonary effort is normal. No respiratory distress.     Breath sounds: Normal breath sounds. No wheezing, rhonchi or rales.  Chest:     Chest wall: No tenderness.  Abdominal:     General: Bowel sounds are normal. There is no distension.     Palpations: Abdomen is soft. There is no mass.     Tenderness: There is no abdominal tenderness. There is no right CVA tenderness, left CVA tenderness, guarding or rebound.  Musculoskeletal:        General: No swelling or tenderness. Normal range of motion.     Cervical back: Normal range of motion. No rigidity or tenderness.     Right lower leg: No edema.     Left lower leg: No edema.  Lymphadenopathy:     Cervical: No cervical adenopathy.  Skin:    General: Skin is warm and dry.     Coloration: Skin is not pale.     Findings: No erythema.  Neurological:     Mental Status: She is  alert and oriented to person, place, and time.     Cranial Nerves: No cranial nerve deficit.     Sensory: No sensory deficit.     Motor: No weakness.     Coordination: Coordination normal.     Gait: Gait normal.     Comments: Tremors resolved   Psychiatric:        Mood and Affect: Mood normal.        Speech: Speech normal.        Behavior: Behavior normal.        Thought Content: Thought content normal.        Judgment: Judgment normal.    Labs reviewed: Recent Labs    05/18/21 1155 07/07/21 1012 01/04/22 1500  NA 140 141 139  K 4.5 4.4 5.2  CL 101 101 100  CO2 '26 25 29  '$ GLUCOSE 76 101* 90  BUN '17 11 12  '$ CREATININE 0.87 0.80 0.97  CALCIUM 9.6 9.2 10.2   Recent Labs    01/04/22 1500  AST 24  ALT 14  BILITOT 0.4  PROT 7.3   Recent Labs    05/18/21 1155 01/04/22 1500  WBC 6.8 7.7  NEUTROABS  --  5,305  HGB 12.7 13.0  HCT 38.9 39.8  MCV 98.7 98.3  PLT 291 272   Lab Results  Component Value Date   TSH 2.490 07/07/2021   No results found for: "HGBA1C" Lab Results  Component Value Date   CHOL 234 (H) 10/18/2021   HDL 82 10/18/2021   LDLCALC 124 (H)  10/18/2021   TRIG 163 (H) 10/18/2021   CHOLHDL 2.9 10/18/2021    Significant Diagnostic Results in last 30 days:  No results found.  Assessment/Plan 1. Depression, recurrent ([place]) Has weaned off Celexa due to hand tremors. Discussed start on Wellbutrin as below  - buPROPion (WELLBUTRIN XL) 150 MG 24 hr tablet; Take 1 tablet (150 mg total) by mouth daily.  Dispense: 30 tablet; Refill: 3 - will follow up with Dr.Miller 04/26/2022 to reevaluate then adjust Wellbutrin if needed   2. Tremors of nervous system Has resolved with weaning off Celexa   3. Memory loss Continue to follow up with Neuro  Family/ staff Communication: Reviewed plan of care with patient verbalized understanding   Labs/tests ordered: None   Next Appointment: Return for Has appointment on 04/26/2022 with Dr.Miller f/u depression  too. Sandrea Hughs, NP

## 2022-03-14 NOTE — Telephone Encounter (Signed)
Pt still has 05/15/22 appt with Dr Jaynee Eagles. Doesn't look like she ended up seeing Dr Vikki Ports in September or at least its not noted in epic.

## 2022-03-21 ENCOUNTER — Ambulatory Visit
Admission: RE | Admit: 2022-03-21 | Discharge: 2022-03-21 | Disposition: A | Discharge status: Home / Self Care | Payer: Medicare Other | Source: Ambulatory Visit | Attending: Nurse Practitioner | Admitting: Nurse Practitioner

## 2022-03-21 DIAGNOSIS — M8589 Other specified disorders of bone density and structure, multiple sites: Secondary | ICD-10-CM | POA: Diagnosis not present

## 2022-03-21 DIAGNOSIS — Z780 Asymptomatic menopausal state: Secondary | ICD-10-CM | POA: Diagnosis not present

## 2022-03-21 DIAGNOSIS — E2839 Other primary ovarian failure: Secondary | ICD-10-CM

## 2022-03-27 ENCOUNTER — Telehealth: Payer: Self-pay | Admitting: Neurology

## 2022-03-27 ENCOUNTER — Telehealth: Payer: Self-pay | Admitting: *Deleted

## 2022-03-27 NOTE — Telephone Encounter (Signed)
Patient was supposed to meet with dr Vikki Ports to review her formal neurocognitive testing did she do that in September? I can't review that with her, he has to. I can meet with her after that in December and discuss the fdg pet scan, now there may be more oiptions, but she was supposed to review his testing with him did she? Please call and ask and if she did not we need to call their office.

## 2022-03-27 NOTE — Telephone Encounter (Signed)
Patient called this afternoon saying she had a TIA Friday night. She wanted to see Dr. Sabra Heck who is her PCP but he was booked Wednesday, the only day he's in the office.. I offered her an appointment with Monina at 2:20 tomorrow. I put her on Monina's schedule.

## 2022-03-28 ENCOUNTER — Ambulatory Visit (INDEPENDENT_AMBULATORY_CARE_PROVIDER_SITE_OTHER): Payer: Medicare Other | Admitting: Adult Health

## 2022-03-28 ENCOUNTER — Encounter: Payer: Self-pay | Admitting: Adult Health

## 2022-03-28 VITALS — BP 118/70 | HR 72 | Temp 98.2°F | Resp 18 | Ht 61.0 in | Wt 147.0 lb

## 2022-03-28 DIAGNOSIS — G3184 Mild cognitive impairment, so stated: Secondary | ICD-10-CM

## 2022-03-28 DIAGNOSIS — R7303 Prediabetes: Secondary | ICD-10-CM | POA: Diagnosis not present

## 2022-03-28 DIAGNOSIS — R350 Frequency of micturition: Secondary | ICD-10-CM | POA: Diagnosis not present

## 2022-03-28 DIAGNOSIS — F339 Major depressive disorder, recurrent, unspecified: Secondary | ICD-10-CM

## 2022-03-28 DIAGNOSIS — N390 Urinary tract infection, site not specified: Secondary | ICD-10-CM | POA: Diagnosis not present

## 2022-03-28 DIAGNOSIS — R7309 Other abnormal glucose: Secondary | ICD-10-CM | POA: Diagnosis not present

## 2022-03-28 LAB — POCT URINALYSIS DIPSTICK
Bilirubin, UA: NEGATIVE
Blood, UA: NEGATIVE
Glucose, UA: NEGATIVE
Ketones, UA: NEGATIVE
Nitrite, UA: POSITIVE
Protein, UA: NEGATIVE
Spec Grav, UA: 1.01 (ref 1.010–1.025)
Urobilinogen, UA: 0.2 E.U./dL
pH, UA: 6.5 (ref 5.0–8.0)

## 2022-03-28 NOTE — Patient Instructions (Signed)
Mild Neurocognitive Disorder Mild neurocognitive disorder, formerly known as mild cognitive impairment, is a disorder in which memory does not work as well as it should. This disorder may also cause problems with other mental functions, including thought, communication, behavior, and completion of tasks. These problems can be noticed and measured, but they usually do not interfere with daily activities or the ability to live independently. Mild neurocognitive disorder typically develops after 79 years of age, but it can also develop at younger ages. It is not as serious as major neurocognitive disorder, also known as dementia, but it may be the first sign of it. Generally, symptoms of this condition get worse over time. In rare cases, symptoms can get better. What are the causes? This condition may be caused by: Brain disorders like Alzheimer's disease, Parkinson's disease, and other conditions that gradually damage nerve cells (neurodegenerative conditions). Diseases that affect blood vessels in the brain and result in small strokes. Certain infections, such as HIV. Traumatic brain injury. Other medical conditions, such as brain tumors, underactive thyroid (hypothyroidism), and vitamin B12 deficiency. Use of certain drugs or prescription medicines. What increases the risk? The following factors may make you more likely to develop this condition: Being older than 65 years. Being female. Low education level. Diabetes, high blood pressure, high cholesterol, and other conditions that increase the risk for blood vessel diseases. Untreated or undertreated sleep apnea. Having a certain type of gene that can be passed from parent to child (inherited). Chronic health problems such as heart disease, lung disease, liver disease, kidney disease, or depression. What are the signs or symptoms? Symptoms of this condition include: Difficulty remembering. You may: Forget names, phone numbers, or details of  recent events. Forget social events and appointments. Repeatedly forget where you put your car keys or other items. Difficulty thinking and solving problems. You may have trouble with complex tasks, such as: Paying bills. Driving in unfamiliar places. Difficulty communicating. You may have trouble: Finding the right word or naming an object. Forming a sentence that makes sense, or understanding what you read or hear. Changes in your behavior or personality. When this happens, you may: Lose interest in the things that you used to enjoy. Withdraw from social situations. Get angry more easily than usual. Act before thinking. How is this diagnosed? This condition is diagnosed based on: Your symptoms. Your health care provider may ask you and the people you spend time with, such as family and friends, about your symptoms. Evaluation of mental functions (neuropsychological testing). Your health care provider may refer you to a neurologist or mental health specialist to evaluate your mental functions in detail. To identify the cause of your condition, your health care provider may: Get a detailed medical history. Ask about use of alcohol, drugs, and prescription medicines. Do a physical exam. Order blood tests and brain imaging exams. How is this treated? Mild neurocognitive disorder that is caused by medicine use, drug use, infection, or another medical condition may improve when the cause is treated, or when medicines or drugs are stopped. If this disorder has another cause, it generally does not improve and may get worse. In these cases, the goal of treatment is to help you manage the loss of mental function. Treatments in these cases include: Medicine. Medicine mainly helps memory and behavior symptoms. Talk therapy. Talk therapy provides education, emotional support, memory aids, and other ways of making up for problems with mental function. Lifestyle changes, including: Getting regular  exercise. Eating a healthy diet   that includes omega-3 fatty acids. Challenging your thinking and memory skills. Having more social interaction. Follow these instructions at home: Eating and drinking  Drink enough fluid to keep your urine pale yellow. Eat a healthy diet that includes omega-3 fatty acids. These can be found in: Fish. Nuts. Leafy vegetables. Vegetable oils. If you drink alcohol: Limit how much you use to: 0-1 drink a day for women. 0-2 drinks a day for men. Be aware of how much alcohol is in your drink. In the [place] one drink equals one 12 oz bottle of beer (355 mL), one 5 oz glass of wine (148 mL), or one 1 oz glass of hard liquor (44 mL). Lifestyle  Get regular exercise as told by your health care provider. Do not use any products that contain nicotine or tobacco, such as cigarettes, e-cigarettes, and chewing tobacco. If you need help quitting, ask your health care provider. Practice ways to manage stress. If you need help managing stress, ask your health care provider. Continue to have social interaction. Keep your mind active with stimulating activities you enjoy, such as reading or playing games. Make sure to get quality sleep. Follow these tips: Avoid napping during the day. Keep your sleeping area dark and cool. Avoid exercising during the few hours before you go to bed. Avoid caffeine products in the evening. General instructions Take over-the-counter and prescription medicines only as told by your health care provider. Your health care provider may recommend that you avoid taking medicines that can affect thinking, such as pain medicines or sleep medicines. Work with your health care provider to find out what you need help with and what your safety needs are. Keep all follow-up visits. This is important. Where to find more information National Institute on Aging: www.nia.nih.gov Contact a health care provider if: You have any new symptoms. Get help right  away if: You develop new confusion or your confusion gets worse. You act in ways that place you or your family in danger. Summary Mild neurocognitive disorder is a disorder in which memory does not work as well as it should. Mild neurocognitive disorder can have many causes. It may be the first stage of dementia. To manage your condition, get regular exercise, keep your mind active, get quality sleep, and eat a healthy diet. This information is not intended to replace advice given to you by your health care provider. Make sure you discuss any questions you have with your health care provider. Document Revised: 10/13/2019 Document Reviewed: 10/13/2019 Elsevier Patient Education  2023 Elsevier Inc.  

## 2022-03-28 NOTE — Telephone Encounter (Signed)
From Dr McDermott's note:   FINAL DIAGNOSIS (ICD-10 considerations):  Mild Neurocognitive Disorder (i.e., mild cognitive impairment), amnestic (etiology unclear)  RECOMMENDATIONS: 1. Follow-up with Dr. Jaynee Eagles.   Could consider treatment with cholinesterase inhibitor.   It is recommended that the patient aggressively manage any modifiable risk factors for further cognitive decline such as strict compliance with prescribed medical treatments for any cerebrovascular risk factors (e.g., high cholesterol, high blood pressure, sleep apnea, diabetes).  Follow-up with the [facility] and [facility] is recommended in 12 months for educational purposes, psychosocial concerns, and neurocognitive testing as necessary. If you wish to make this follow-up appointment, please contact our office at [phone].

## 2022-03-28 NOTE — Progress Notes (Signed)
[facility] clinic   Provider:    Durenda Age DNP Code Status:  Full Code  Goals of Care:     03/02/2022    9:31 AM  Advanced Directives  Does Patient Have a Medical Advance Directive? Yes  Type of Paramedic of [place];Living will  Does patient want to make changes to medical advance directive? No - Patient declined  Copy of [place] in Chart? No - copy requested     Chief Complaint  Patient presents with   Acute Visit    Patient is here from possible TIA on 03/24/2022    HPI: Patient is a 81 y.o. female seen today for an acute visit for possible TIA. Three days ago, she woke up confused. She was in her kitchen, where she has been living for years, but she doesn't know where the dishes are. She was eventually able to cook but was having to think hard  before she can accomplish a task.  A MMSE test was done in the clinic and scored 28/30, normal.   Urine dipstick showed moderate leukocytes and  +nitrites.  Past Medical History:  Diagnosis Date   Barrett's esophagus    Colitis    DDD (degenerative disc disease)    Factor 5 Leiden mutation, heterozygous [facility])    Per [place] New Patient Packet.   Frequent UTI    H/O echocardiogram 04/01/2019   Per [place] Patient Packet.   Hiatal hernia    Per [place] New Patient Packet.   High grade dysplasia of Barrett's epithelium    Per [facility] New Patient Packet.   History of bladder infections    Per [facility] New Patient Packet.   History of Papanicolaou smear of cervix 10/16/2011   Per [place] Patient Packet   Hypoglycemia    Hypotension    Per [facility] New Patient Packet.   IBS (irritable bowel syndrome)    Per [place] New Patient Packet.   Lactose intolerance    Osteoarthritis    Per [place] New Patient Packet.   Osteopenia    Per [place] New Patient Packet.   Osteoporosis    Premature ventricular complex    Raynaud's disease    Per [place] Patient Packet.   Scoliosis    Per [facility] New Patient Packet.     Past Surgical History:  Procedure Laterality Date   ABDOMINAL EXPOSURE N/A 08/12/2020   Procedure: ABDOMINAL EXPOSURE;  Surgeon: Serafina Mitchell, MD;  Location: [facility] OR;  Service: Vascular;  Laterality: N/A;   ABDOMINAL HYSTERECTOMY  06/13/2011   [facility]. Per [facility] New Patient Packet.   ANTERIOR LAT LUMBAR FUSION Right 08/12/2020   Procedure: Right Lumbar two-three, Lumbar three-four, Lumbar four-five Anterolateral lumbar interbody fusion;  Surgeon: Erline Levine, MD;  Location: [place];  Service: Neurosurgery;  Laterality: Right;   ANTERIOR LUMBAR FUSION N/A 08/12/2020   Procedure: Lumbar five Sacral one Anterior lumbar interbody fusion;  Surgeon: Erline Levine, MD;  Location: [place];  Service: Neurosurgery;  Laterality: N/A;   BREAST EXCISIONAL BIOPSY Left 30+ yrs ago   COLONOSCOPY  11/23/2011   By Dr.McCune at [facility]. Per [place] Patient Packet.   COLONOSCOPY  08/26/2019   LUMBAR PERCUTANEOUS PEDICLE SCREW 4 LEVEL N/A 08/12/2020   Procedure: Percutaneous pedicle screw fixation from Lumbar two to Sacral one ;  Surgeon: Erline Levine, MD;  Location: [place];  Service: Neurosurgery;  Laterality: N/A;   MAMMOGRAM  04/14/2019  pap smear  10/16/2011   SIGMOIDOSCOPY  11/02/2014   Per [place] New Patient Packet   TONSILLECTOMY  06/12/1948   Per [facility] New Patient Packet.    Allergies  Allergen Reactions   Banana Swelling    Swelling around mouth and eyes and red blotches   Latex Swelling, Rash and Other (See Comments)   Ciprofloxacin Hcl Other (See Comments)    Severe knee inflammation    Gatifloxacin Other (See Comments)    Severe knee inflammation    Levofloxacin Other (See Comments)    Severe knee inflammation    Moxifloxacin Other (See Comments)    Severe knee inflammation    Norfloxacin Other (See Comments)    Severe knee inflammation    Nsaids Other (See Comments)    Upsets ulcers   Ofloxacin Other (See Comments)    Severe knee  inflammation   Other Other (See Comments)    FLOXIN   Penicillins Hives    Tolerates (KEFLEX-cephalexin)    Methylisothiazolinone Rash and Other (See Comments)    A preservative found in a cream pt used   Tape Rash    Outpatient Encounter Medications as of 03/28/2022  Medication Sig   aspirin EC 81 MG tablet Take 81 mg by mouth 3 (three) times daily.   atorvastatin (LIPITOR) 10 MG tablet Take 1 tablet (10 mg total) by mouth daily.   AZO-CRANBERRY PO Take 1 capsule by mouth as needed (UTI Symptoms).   Bismuth Subsalicylate (PEPTO-BISMOL PO) Take by mouth as needed.   buPROPion (WELLBUTRIN XL) 150 MG 24 hr tablet Take 1 tablet (150 mg total) by mouth daily.   CALCIUM PO Take 1,200 mg by mouth daily.    Cholecalciferol (VITAMIN D3) 50 MCG (2000 UT) TABS Take 2,000 Units by mouth in the morning, at noon, and at bedtime.   conjugated estrogens (PREMARIN) vaginal cream Place 1 applicator vaginally once a week.   denosumab (PROLIA) 60 MG/ML SOSY injection Inject 60 mg into the skin every 6 (six) months.   Diclofenac Sodium (MOTRIN ARTHRITIS PAIN EX) Apply topically.   fluticasone (FLONASE) 50 MCG/ACT nasal spray Place 1 spray into both nostrils as needed for allergies or rhinitis.   Glucosamine HCl 1500 MG TABS Take 1,500 mg by mouth daily.   Homeopathic Products (SIMILASAN DRY EYE RELIEF OP) Apply to eye.   Loperamide-Simethicone 2-125 MG TABS Take 1 tablet by mouth daily.   mesalamine (LIALDA) 1.2 g EC tablet Take 1.2 g by mouth daily.   methenamine (HIPREX) 1 g tablet Take 1 g by mouth 2 (two) times daily with a meal. For UTI Prevention. Last UTI: October 20th, November 5th, and December 28th.   Multiple Vitamin (MULTIVITAMIN WITH MINERALS) TABS tablet Take 1 tablet by mouth daily. One A Day for Women   Omeprazole-Sodium Bicarbonate (ZEGERID) 20-1100 MG CAPS capsule Take 1 capsule by mouth 2 (two) times daily.   OVER THE COUNTER MEDICATION Place 1 drop into both ears as needed. Grapefruit  Seed Extract Oil   Polyethyl Glycol-Propyl Glycol (SYSTANE) 0.4-0.3 % SOLN Place 1 drop into both eyes as needed (dry/irritated eyes).   Probiotic Product (PROBIOTIC COLON SUPPORT PO) Take 1 tablet by mouth in the morning and at bedtime.   psyllium (METAMUCIL) 58.6 % powder Take 1 packet by mouth 3 (three) times daily.   valACYclovir (VALTREX) 500 MG tablet TAKE ONE TABLET BY MOUTH DAILY AS NEEDED   No facility-administered encounter medications on file as of 03/28/2022.    Review of Systems:  Review of Systems  Constitutional:  Negative for appetite change, chills, fatigue and fever.  HENT:  Negative for congestion, hearing loss, rhinorrhea and sore throat.   Eyes: Negative.   Respiratory:  Negative for cough, shortness of breath and wheezing.   Cardiovascular:  Negative for chest pain, palpitations and leg swelling.  Gastrointestinal:  Negative for abdominal pain, constipation, diarrhea, nausea and vomiting.  Genitourinary:  Negative for dysuria.  Musculoskeletal:  Negative for arthralgias, back pain and myalgias.  Skin:  Negative for color change, rash and wound.  Neurological:  Negative for dizziness, weakness and headaches.  Psychiatric/Behavioral:  Negative for behavioral problems. The patient is not nervous/anxious.     Health Maintenance  Topic Date Due   Zoster Vaccines- Shingrix (2 of 2) 08/07/2017   COVID-19 Vaccine (4 - Moderna series) 06/21/2020   TETANUS/TDAP  11/19/2029   Pneumonia Vaccine 54+ Years old  Completed   INFLUENZA VACCINE  Completed   DEXA SCAN  Completed   HPV VACCINES  Aged Out   Hepatitis C Screening  Discontinued    Physical Exam: Vitals:   03/28/22 1420  BP: 118/70  Pulse: 72  Resp: 18  Temp: 98.2 F (36.8 C)  Weight: 147 lb (66.7 kg)  Height: '5\' 1"'$  (1.549 m)   Body mass index is 27.78 kg/m. Physical Exam Constitutional:      Appearance: Normal appearance.  HENT:     Head: Normocephalic and atraumatic.     Nose: Nose normal.      Mouth/Throat:     Mouth: Mucous membranes are moist.  Eyes:     Conjunctiva/sclera: Conjunctivae normal.  Cardiovascular:     Rate and Rhythm: Normal rate and regular rhythm.  Pulmonary:     Effort: Pulmonary effort is normal.     Breath sounds: Normal breath sounds.  Abdominal:     General: Bowel sounds are normal.     Palpations: Abdomen is soft.  Musculoskeletal:        General: Normal range of motion.     Cervical back: Normal range of motion.  Skin:    General: Skin is warm and dry.  Neurological:     General: No focal deficit present.     Mental Status: She is alert and oriented to person, place, and time.  Psychiatric:        Mood and Affect: Mood normal.        Behavior: Behavior normal.        Thought Content: Thought content normal.        Judgment: Judgment normal.     Labs reviewed: Basic Metabolic Panel: Recent Labs    05/18/21 1155 07/07/21 1012 01/04/22 1500  NA 140 141 139  K 4.5 4.4 5.2  CL 101 101 100  CO2 '26 25 29  '$ GLUCOSE 76 101* 90  BUN '17 11 12  '$ CREATININE 0.87 0.80 0.97  CALCIUM 9.6 9.2 10.2  TSH  --  2.490  --    Liver Function Tests: Recent Labs    01/04/22 1500  AST 24  ALT 14  BILITOT 0.4  PROT 7.3   No results for input(s): "LIPASE", "AMYLASE" in the last 8760 hours. No results for input(s): "AMMONIA" in the last 8760 hours. CBC: Recent Labs    05/18/21 1155 01/04/22 1500  WBC 6.8 7.7  NEUTROABS  --  5,305  HGB 12.7 13.0  HCT 38.9 39.8  MCV 98.7 98.3  PLT 291 272   Lipid Panel: Recent Labs  10/18/21 1055  CHOL 234*  HDL 82  LDLCALC 124*  TRIG 163*  CHOLHDL 2.9   No results found for: "HGBA1C"  Procedures since last visit: DG Bone Density  Result Date: 03/21/2022 EXAM: DUAL X-RAY ABSORPTIOMETRY (DXA) FOR BONE MINERAL DENSITY IMPRESSION: Referring Physician:  Lauree Chandler Your patient completed a bone mineral density test using GE Lunar iDXA system (analysis version: 16). Technologist: EDH PATIENT:  Name: Carol Perez, Carol Perez Patient ID: [id number] Birth Date: 09-27-1940 Height: 61.0 in. Sex: Female Measured: 03/21/2022 Weight: 143.6 lbs. Indications: Advanced Age, Caucasian, Estrogen Deficient, Postmenopausal Fractures: NONE Treatments: Calcium (E943.0), Prolia, Vitamin D (E933.5) ASSESSMENT: The BMD measured at Femur Neck Left is 0.756 g/cm2 with a T-score of -2.0. This patient is considered osteopenic/low bone mass according to [facility]) criteria. The quality of the exam is good. The lumbar spine was excluded due to surgical hardware. Patient does not meet criteria for FRAX due to current use of Prolia. Site Region Measured Date Measured Age YA BMD Significant CHANGE T-score DualFemur Neck Left 03/21/2022 79.4 -2.0 0.756 g/cm2 DualFemur Total Mean 03/21/2022 79.4 -1.4 0.830 g/cm2 Left Forearm Radius 33% 03/21/2022 79.4 -1.9 0.713 g/cm2 World Health Organization [facility]) criteria for post-menopausal, Caucasian Women: Normal       T-score at or above -1 SD Osteopenia   T-score between -1 and -2.5 SD Osteoporosis T-score at or below -2.5 SD RECOMMENDATION: 1. All patients should optimize calcium and vitamin D intake. 2. Consider FDA-approved medical therapies in postmenopausal women and men aged 66 years and older, based on the following: a. A hip or vertebral (clinical or morphometric) fracture. b. T-score = -2.5 at the femoral neck or spine after appropriate evaluation to exclude secondary causes. c. Low bone mass (T-score between -1.0 and -2.5 at the femoral neck or spine) and a 10-year probability of a hip fracture = 3% or a 10-year probability of a major osteoporosis-related fracture = 20% based on the [place]-adapted WHO algorithm. d. Clinician judgment and/or patient preferences may indicate treatment for people with 10-year fracture probabilities above or below these levels. FOLLOW-UP: Patients with diagnosis of osteoporosis or at high risk for fracture should have regular bone mineral density tests.  Patients eligible for Medicare are allowed routine testing every 2 years. The testing frequency can be increased to one year for patients who have rapidly progressing disease, are receiving or discontinuing medical therapy to restore bone mass, or have additional risk factors. I have reviewed this study and agree with the findings. [facility] Electronically Signed   By: Ammie Ferrier M.D.   On: 03/21/2022 10:11    Assessment/Plan  1. MCI (mild cognitive impairment) -  MMSE 28/30, within normal -  instructed to do crossword puzzle -   ambulatory referral to neurology  2. Depression, recurrent ([place]) -  mood is stable, continue Wellbutrin  3. Increased urinary frequency -  culture showed UTI - Urine Culture - Hemoglobin A1C  4. Prediabetes Lab Results  Component Value Date   HGBA1C 5.9 (H) 03/28/2022   -  diet-controlled  5. Urinary tract infection without hematuria, site unspecified -  03/31/22, urine culture resulted in E coli > 100, 000 CFU/ml - nitrofurantoin, macrocrystal-monohydrate, (MACROBID) 100 MG capsule; Take 1 capsule (100 mg total) by mouth 2 (two) times daily for 7 days.  Dispense: 14 capsule; Refill: 0     Labs/tests ordered:  CBC, CMP, A1c,  Next appt:  04/26/2022

## 2022-03-28 NOTE — Telephone Encounter (Signed)
Looks like she saw Dr Vikki Ports on 09/27/2021 for the feedback session. Ok to leave December appt with you as is then?

## 2022-03-29 ENCOUNTER — Telehealth: Payer: Self-pay

## 2022-03-29 NOTE — Telephone Encounter (Signed)
Patient called and said someone told her she has bladder infection. Based on patient labs I informed her that I dont see where anyone has called her. Message routed to PCP Sabra Heck Lillette Boxer, MD

## 2022-03-30 ENCOUNTER — Other Ambulatory Visit: Payer: Self-pay | Admitting: *Deleted

## 2022-03-30 MED ORDER — VALACYCLOVIR HCL 500 MG PO TABS: 500.0000 mg | ORAL_TABLET | Freq: Every day | ORAL | 0 refills | Status: DC | PRN

## 2022-03-30 NOTE — Telephone Encounter (Signed)
Patient called requesting refill

## 2022-03-31 LAB — CBC WITH DIFFERENTIAL/PLATELET
Absolute Monocytes: 643 cells/uL (ref 200–950)
Basophils Absolute: 72 cells/uL (ref 0–200)
Basophils Relative: 1.5 %
Eosinophils Absolute: 0 cells/uL — ABNORMAL LOW (ref 15–500)
Eosinophils Relative: 0 %
HCT: 36.5 % (ref 35.0–45.0)
Hemoglobin: 12.2 g/dL (ref 11.7–15.5)
Lymphs Abs: 1118 cells/uL (ref 850–3900)
MCH: 32.8 pg (ref 27.0–33.0)
MCHC: 33.4 g/dL (ref 32.0–36.0)
MCV: 98.1 fL (ref 80.0–100.0)
MPV: 10.3 fL (ref 7.5–12.5)
Monocytes Relative: 13.4 %
Neutro Abs: 2966 cells/uL (ref 1500–7800)
Neutrophils Relative %: 61.8 %
Platelets: 247 10*3/uL (ref 140–400)
RBC: 3.72 10*6/uL — ABNORMAL LOW (ref 3.80–5.10)
RDW: 12.2 % (ref 11.0–15.0)
Total Lymphocyte: 23.3 %
WBC: 4.8 10*3/uL (ref 3.8–10.8)

## 2022-03-31 LAB — COMPLETE METABOLIC PANEL WITH GFR
AG Ratio: 1.6 (calc) (ref 1.0–2.5)
ALT: 19 U/L (ref 6–29)
AST: 23 U/L (ref 10–35)
Albumin: 4.2 g/dL (ref 3.6–5.1)
Alkaline phosphatase (APISO): 78 U/L (ref 37–153)
BUN: 14 mg/dL (ref 7–25)
CO2: 29 mmol/L (ref 20–32)
Calcium: 9.4 mg/dL (ref 8.6–10.4)
Chloride: 105 mmol/L (ref 98–110)
Creat: 0.94 mg/dL (ref 0.60–1.00)
Globulin: 2.7 g/dL (calc) (ref 1.9–3.7)
Glucose, Bld: 92 mg/dL (ref 65–139)
Potassium: 4.5 mmol/L (ref 3.5–5.3)
Sodium: 143 mmol/L (ref 135–146)
Total Bilirubin: 0.3 mg/dL (ref 0.2–1.2)
Total Protein: 6.9 g/dL (ref 6.1–8.1)
eGFR: 62 mL/min/{1.73_m2} (ref >=60)

## 2022-03-31 LAB — HEMOGLOBIN A1C
Hgb A1c MFr Bld: 5.9 % of total Hgb — ABNORMAL HIGH (ref <5.7)
Mean Plasma Glucose: 123 mg/dL
eAG (mmol/L): 6.8 mmol/L

## 2022-03-31 LAB — URINE CULTURE
MICRO NUMBER:: 14062627
SPECIMEN QUALITY:: ADEQUATE

## 2022-03-31 MED ORDER — NITROFURANTOIN MONOHYD MACRO 100 MG PO CAPS: 100.0000 mg | ORAL_CAPSULE | Freq: Two times a day (BID) | ORAL | 0 refills | Status: AC

## 2022-03-31 NOTE — Progress Notes (Signed)
Has moderate leukocyte and positive nitrites. Urine culture showed E. Coli 100,000 CFU/ml; possibly adding to the cognitive impairment. -  5.9 A1c, prediabetic -  CMP and CBC within normal -  no new order

## 2022-04-03 DIAGNOSIS — N3020 Other chronic cystitis without hematuria: Secondary | ICD-10-CM | POA: Diagnosis not present

## 2022-04-03 DIAGNOSIS — R3912 Poor urinary stream: Secondary | ICD-10-CM | POA: Diagnosis not present

## 2022-04-03 NOTE — Telephone Encounter (Signed)
Has appt 05-15-2022.  Has seen Dr. Vikki Ports for feedback on neuropsych testing.  Ivar Drape in referrals will keep the 05-15-2022 appt abd discuss other options prior to ordering.

## 2022-04-03 NOTE — Telephone Encounter (Signed)
I would like to know if patient is interested in a follow up with me. We also discussed possily ordering more testing but since Lecanumab has come out we have more options for testing and treatment. If she would like she can follow up with me to discuss thank you

## 2022-04-04 NOTE — Telephone Encounter (Addendum)
Carol Perez Age, NP sent in prescription for [facility] 03/31/22. Patient has not taken medication yet because urologist told her to hold off on taken it. Patient is due to see urologist in about a week and if bacteria still present then he will want her to take medication.  Message routed to Dr. Alain Honey [facility])

## 2022-04-04 NOTE — Telephone Encounter (Signed)
Lets call her in macrobid, 100 mg BID #10 for UTI

## 2022-04-10 ENCOUNTER — Other Ambulatory Visit: Payer: Self-pay

## 2022-04-10 DIAGNOSIS — E785 Hyperlipidemia, unspecified: Secondary | ICD-10-CM

## 2022-04-10 MED ORDER — ATORVASTATIN CALCIUM 10 MG PO TABS: 10.0000 mg | ORAL_TABLET | Freq: Every day | ORAL | 1 refills | Status: DC

## 2022-04-14 ENCOUNTER — Other Ambulatory Visit: Payer: Self-pay | Admitting: Family Medicine

## 2022-04-14 DIAGNOSIS — Z1231 Encounter for screening mammogram for malignant neoplasm of breast: Secondary | ICD-10-CM

## 2022-04-18 DIAGNOSIS — H0101B Ulcerative blepharitis left eye, upper and lower eyelids: Secondary | ICD-10-CM | POA: Diagnosis not present

## 2022-04-18 DIAGNOSIS — F32A Depression, unspecified: Secondary | ICD-10-CM | POA: Diagnosis not present

## 2022-04-18 DIAGNOSIS — H524 Presbyopia: Secondary | ICD-10-CM | POA: Diagnosis not present

## 2022-04-18 DIAGNOSIS — H04123 Dry eye syndrome of bilateral lacrimal glands: Secondary | ICD-10-CM | POA: Diagnosis not present

## 2022-04-25 ENCOUNTER — Ambulatory Visit: Payer: Medicare Other | Admitting: Family Medicine

## 2022-04-26 ENCOUNTER — Ambulatory Visit (INDEPENDENT_AMBULATORY_CARE_PROVIDER_SITE_OTHER): Payer: Medicare Other | Admitting: Family Medicine

## 2022-04-26 ENCOUNTER — Encounter: Payer: Self-pay | Admitting: Family Medicine

## 2022-04-26 VITALS — BP 128/70 | Temp 97.7°F | Ht 61.0 in | Wt 144.0 lb

## 2022-04-26 DIAGNOSIS — F339 Major depressive disorder, recurrent, unspecified: Secondary | ICD-10-CM | POA: Diagnosis not present

## 2022-04-26 DIAGNOSIS — M810 Age-related osteoporosis without current pathological fracture: Secondary | ICD-10-CM

## 2022-04-26 DIAGNOSIS — E785 Hyperlipidemia, unspecified: Secondary | ICD-10-CM | POA: Diagnosis not present

## 2022-04-26 DIAGNOSIS — G4762 Sleep related leg cramps: Secondary | ICD-10-CM | POA: Diagnosis not present

## 2022-04-26 NOTE — Progress Notes (Signed)
Provider:  Alain Honey, MD  Careteam: Patient Care Team: Wardell Honour, MD as PCP - General (Family Medicine) Rozetta Nunnery, MD (Inactive) as Consulting Physician (Otolaryngology) Luberta Mutter, MD as Consulting Physician (Ophthalmology)  PLACE OF SERVICE:  [place]  Advanced Directive information    Allergies  Allergen Reactions   Banana Swelling    Swelling around mouth and eyes and red blotches   Latex Swelling, Rash and Other (See Comments)   Ciprofloxacin Hcl Other (See Comments)    Severe knee inflammation    Gatifloxacin Other (See Comments)    Severe knee inflammation    Levofloxacin Other (See Comments)    Severe knee inflammation    Moxifloxacin Other (See Comments)    Severe knee inflammation    Norfloxacin Other (See Comments)    Severe knee inflammation    Nsaids Other (See Comments)    Upsets ulcers   Ofloxacin Other (See Comments)    Severe knee inflammation   Other Other (See Comments)    FLOXIN   Penicillins Hives    Tolerates (KEFLEX-cephalexin)    Methylisothiazolinone Rash and Other (See Comments)    A preservative found in a cream pt used   Tape Rash    Chief Complaint  Patient presents with   Medical Management of Chronic Issues    Patient presents today for a 6 month follow-up   Quality Metric Gaps    Zoster and COVID#4     HPI: Patient is a 81 y.o. female patient is here to follow-up mild cognitive disorder.  She is also followed by Dr. Lavell Anchors, neurologist and has undergone Neuropsych testing at [facility].  We spent today's visit just talking about symptoms and concerns.  She does have a family history of Alzheimer's in her mother.  There have been no recent changes. She did have an episode where she awoke 1 morning and was confused about where the kitchen was and where plates were but they quickly passed.  Almost sounds like a transient amnesic event or possible TIA. Again we talked about what we know is  important such as routines diet exercise sleep rest and exercising of the brain doing soduko, crossword puzzles or the like. She did have possible UTI that could have had some bearing on the symptoms although in my experience UTIs are more likely to affect cognitive abilities if there is some underlying pathology.   Review of Systems:  Review of Systems  Constitutional: Negative.   HENT: Negative.    Respiratory: Negative.    Cardiovascular: Negative.   Genitourinary: Negative.   Psychiatric/Behavioral:  Positive for memory loss.   All other systems reviewed and are negative.   Past Medical History:  Diagnosis Date   Barrett's esophagus    Colitis    DDD (degenerative disc disease)    Factor 5 Leiden mutation, heterozygous [facility])    Per [place] New Patient Packet.   Frequent UTI    H/O echocardiogram 04/01/2019   Per [place] Patient Packet.   Hiatal hernia    Per [place] New Patient Packet.   High grade dysplasia of Barrett's epithelium    Per [facility] New Patient Packet.   History of bladder infections    Per [facility] New Patient Packet.   History of Papanicolaou smear of cervix 10/16/2011   Per [place] Patient Packet   Hypoglycemia    Hypotension    Per [facility] New Patient Packet.   IBS (irritable bowel syndrome)  Per [facility] New Patient Packet.   Lactose intolerance    Osteoarthritis    Per [place] New Patient Packet.   Osteopenia    Per [place] New Patient Packet.   Osteoporosis    Premature ventricular complex    Raynaud's disease    Per [place] Patient Packet.   Scoliosis    Per [facility] New Patient Packet.   Past Surgical History:  Procedure Laterality Date   ABDOMINAL EXPOSURE N/A 08/12/2020   Procedure: ABDOMINAL EXPOSURE;  Surgeon: Serafina Mitchell, MD;  Location: [facility] OR;  Service: Vascular;  Laterality: N/A;   ABDOMINAL HYSTERECTOMY  06/13/2011   By Dr.Scherer at [facility]. Per [facility] New Patient Packet.   ANTERIOR LAT LUMBAR FUSION Right 08/12/2020   Procedure: Right Lumbar two-three,  Lumbar three-four, Lumbar four-five Anterolateral lumbar interbody fusion;  Surgeon: Erline Levine, MD;  Location: [place];  Service: Neurosurgery;  Laterality: Right;   ANTERIOR LUMBAR FUSION N/A 08/12/2020   Procedure: Lumbar five Sacral one Anterior lumbar interbody fusion;  Surgeon: Erline Levine, MD;  Location: [place];  Service: Neurosurgery;  Laterality: N/A;   BREAST EXCISIONAL BIOPSY Left 30+ yrs ago   COLONOSCOPY  11/23/2011   By Dr.McCune at [facility]. Per [place] Patient Packet.   COLONOSCOPY  08/26/2019   LUMBAR PERCUTANEOUS PEDICLE SCREW 4 LEVEL N/A 08/12/2020   Procedure: Percutaneous pedicle screw fixation from Lumbar two to Sacral one ;  Surgeon: Erline Levine, MD;  Location: [place];  Service: Neurosurgery;  Laterality: N/A;   MAMMOGRAM  04/14/2019   pap smear  10/16/2011   SIGMOIDOSCOPY  11/02/2014   Per [place] New Patient Packet   TONSILLECTOMY  06/12/1948   Per [facility] New Patient Packet.   Social History:   reports that she has quit smoking. Her smoking use included cigarettes. She started smoking about 44 years ago. She has never used smokeless tobacco. She reports current alcohol use of about 7.0 standard drinks of alcohol per week. She reports that she does not use drugs.  Family History  Problem Relation Age of Onset   Cancer Mother    Dementia Mother    Alzheimer's disease Mother    Cancer Father    Alzheimer's disease Maternal Grandmother    Dementia Maternal Grandmother    Alzheimer's disease Maternal Grandfather     Medications: Patient's Medications  New Prescriptions   No medications on file  Previous Medications   ASPIRIN EC 81 MG TABLET    Take 81 mg by mouth 3 (three) times daily.   ATORVASTATIN (LIPITOR) 10 MG TABLET    Take 1 tablet (10 mg total) by mouth daily.   AZO-CRANBERRY PO    Take 1 capsule by mouth as needed (UTI Symptoms).   BISMUTH SUBSALICYLATE (PEPTO-BISMOL PO)    Take by mouth as needed.   BUPROPION (WELLBUTRIN XL) 150 MG 24  HR TABLET    Take 1 tablet (150 mg total) by mouth daily.   CALCIUM PO    Take 1,200 mg by mouth daily.    CHOLECALCIFEROL (VITAMIN D3) 50 MCG (2000 UT) TABS    Take 2,000 Units by mouth in the morning, at noon, and at bedtime.   CONJUGATED ESTROGENS (PREMARIN) VAGINAL CREAM    Place 1 applicator vaginally once a week.   DENOSUMAB (PROLIA) 60 MG/ML SOSY INJECTION    Inject 60 mg into the skin every 6 (six) months.   DICLOFENAC SODIUM (MOTRIN ARTHRITIS PAIN EX)    Apply topically.  FLUTICASONE (FLONASE) 50 MCG/ACT NASAL SPRAY    Place 1 spray into both nostrils as needed for allergies or rhinitis.   GLUCOSAMINE HCL 1500 MG TABS    Take 1,500 mg by mouth daily.   HOMEOPATHIC PRODUCTS (SIMILASAN DRY EYE RELIEF OP)    Apply to eye.   LOPERAMIDE-SIMETHICONE 2-125 MG TABS    Take 1 tablet by mouth daily.   MESALAMINE (LIALDA) 1.2 G EC TABLET    Take 1.2 g by mouth daily.   METHENAMINE (HIPREX) 1 G TABLET    Take 1 g by mouth 2 (two) times daily with a meal. For UTI Prevention. Last UTI: October 20th, November 5th, and December 28th.   MULTIPLE VITAMIN (MULTIVITAMIN WITH MINERALS) TABS TABLET    Take 1 tablet by mouth daily. One A Day for Women   OMEPRAZOLE-SODIUM BICARBONATE (ZEGERID) 20-1100 MG CAPS CAPSULE    Take 1 capsule by mouth 2 (two) times daily.   OVER THE COUNTER MEDICATION    Place 1 drop into both ears as needed. Grapefruit Seed Extract Oil   POLYETHYL GLYCOL-PROPYL GLYCOL (SYSTANE) 0.4-0.3 % SOLN    Place 1 drop into both eyes as needed (dry/irritated eyes).   PROBIOTIC PRODUCT (PROBIOTIC COLON SUPPORT PO)    Take 1 tablet by mouth in the morning and at bedtime.   PSYLLIUM (METAMUCIL) 58.6 % POWDER    Take 1 packet by mouth 3 (three) times daily.   VALACYCLOVIR (VALTREX) 500 MG TABLET    Take 1 tablet (500 mg total) by mouth daily as needed.  Modified Medications   No medications on file  Discontinued Medications   No medications on file    Physical Exam:  Vitals:   04/26/22 1041   Weight: 144 lb (65.3 kg)  Height: '5\' 1"'$  (1.549 m)   Body mass index is 27.21 kg/m. Wt Readings from Last 3 Encounters:  04/26/22 144 lb (65.3 kg)  03/28/22 147 lb (66.7 kg)  03/06/22 145 lb (65.8 kg)      Labs reviewed: Basic Metabolic Panel: Recent Labs    07/07/21 1012 01/04/22 1500 03/28/22 1502  NA 141 139 143  K 4.4 5.2 4.5  CL 101 100 105  CO2 '25 29 29  '$ GLUCOSE 101* 90 92  BUN '11 12 14  '$ CREATININE 0.80 0.97 0.94  CALCIUM 9.2 10.2 9.4  TSH 2.490  --   --    Liver Function Tests: Recent Labs    01/04/22 1500 03/28/22 1502  AST 24 23  ALT 14 19  BILITOT 0.4 0.3  PROT 7.3 6.9   No results for input(s): "LIPASE", "AMYLASE" in the last 8760 hours. No results for input(s): "AMMONIA" in the last 8760 hours. CBC: Recent Labs    05/18/21 1155 01/04/22 1500 03/28/22 1502  WBC 6.8 7.7 4.8  NEUTROABS  --  5,305 2,966  HGB 12.7 13.0 12.2  HCT 38.9 39.8 36.5  MCV 98.7 98.3 98.1  PLT 291 272 247   Lipid Panel: Recent Labs    10/18/21 1055  CHOL 234*  HDL 82  LDLCALC 124*  TRIG 163*  CHOLHDL 2.9   TSH: Recent Labs    07/07/21 1012  TSH 2.490   A1C: Lab Results  Component Value Date   HGBA1C 5.9 (H) 03/28/2022     Assessment/Plan  1. Depression, recurrent ([place]) She continues to take Wellbutrin I think this is appropriate given the overlap of symptoms of mild cognitive decline and depression  2. Hyperlipidemia, unspecified hyperlipidemia type Her  LDL is not at goal even though she has a high level of HDL.  I am not sure that the self said but would like to get LDL under 100.  Toward that hand will increase atorvastatin from 10 mg to 20 mg/day and recheck in 6 months  3. Nocturnal leg cramps I did not really ask her about the symptoms today on note that we did start Requip in the past and I am not sure that she is taking that.  I need to confirm at her next visit  4. Age-related osteoporosis without current pathological fracture She is  taking Prolia and had expected to get that shot today but was told its too early and apparently frequency is strictly controlled   Alain Honey, MD [place] [phone]

## 2022-05-01 DIAGNOSIS — N3021 Other chronic cystitis with hematuria: Secondary | ICD-10-CM | POA: Diagnosis not present

## 2022-05-01 DIAGNOSIS — R419 Unspecified symptoms and signs involving cognitive functions and awareness: Secondary | ICD-10-CM | POA: Insufficient documentation

## 2022-05-01 DIAGNOSIS — R3912 Poor urinary stream: Secondary | ICD-10-CM | POA: Diagnosis not present

## 2022-05-15 ENCOUNTER — Ambulatory Visit: Payer: Medicare Other | Admitting: Neurology

## 2022-05-15 ENCOUNTER — Encounter: Payer: Self-pay | Admitting: Neurology

## 2022-05-15 VITALS — BP 142/75 | HR 69 | Ht 61.0 in | Wt 143.2 lb

## 2022-05-15 DIAGNOSIS — Z820 Family history of epilepsy and other diseases of the nervous system: Secondary | ICD-10-CM | POA: Diagnosis not present

## 2022-05-15 DIAGNOSIS — R410 Disorientation, unspecified: Secondary | ICD-10-CM | POA: Diagnosis not present

## 2022-05-15 DIAGNOSIS — N390 Urinary tract infection, site not specified: Secondary | ICD-10-CM

## 2022-05-15 DIAGNOSIS — G309 Alzheimer's disease, unspecified: Secondary | ICD-10-CM

## 2022-05-15 DIAGNOSIS — R0683 Snoring: Secondary | ICD-10-CM

## 2022-05-15 DIAGNOSIS — G3184 Mild cognitive impairment, so stated: Secondary | ICD-10-CM | POA: Diagnosis not present

## 2022-05-15 DIAGNOSIS — R400 Somnolence: Secondary | ICD-10-CM | POA: Diagnosis not present

## 2022-05-15 DIAGNOSIS — F0280 Dementia in other diseases classified elsewhere without behavioral disturbance: Secondary | ICD-10-CM

## 2022-05-15 MED ORDER — ADLARITY 5 MG/DAY TD PTWK: 5.0000 mg | MEDICATED_PATCH | TRANSDERMAL | 0 refills | Status: DC

## 2022-05-15 MED ORDER — MEMANTINE HCL 10 MG PO TABS: ORAL_TABLET | ORAL | 6 refills | Status: DC

## 2022-05-15 NOTE — Patient Instructions (Addendum)
1.start Namenda/Memantine first/ now. (we eventually want you on BOTH aricept/donepeazil and Namenda/memantine but start namenda first and when we know you are stable in 4-6 weeks can try the Aricept. We do not want to start 2 meds at the same time just in case there are side effects we will not know which one is the cause.   2. Aricept Patch - keep in the fridage, wait and start until AFTER you are on namenda/menantine twice daily and doing well 4-6 weeks  3. Both of the above are used at the same time concurrently to slow down memory loss.   4. Consider Lecanemab - new, just approved by FDA in July  5. Melatonin ok at night 1-5 mg at bedtime.   6. Wakes up with dry mouth, she snores, naps, memory loss, would like to send her for sleep test, wakes often. Sleep apnea can be a risk factor for dementia  7. FDG PET Scan - this is a scan that can tell us in the FUTURE if you may develop alzheimers disease  8. If you decide you are intrested in lecanemab we would then perform a PET Amyloid scan to see if you have amyloid in the brain right NOW    Lecanemab Injection What is this medication? LECANEMAB (lek AN e mab) treats Alzheimer disease. It works by decreasing the buildup of amyloid, a protein that may cause Alzheimer disease. This may slow down the worsening of symptoms. It is a monoclonal antibody. This medicine may be used for other purposes; ask your health care provider or pharmacist if you have questions. COMMON BRAND NAME(S): LEQEMBI What should I tell my care team before I take this medication? They need to know if you have any of these conditions: An unusual or allergic reaction to lecanemab, other medications, foods, dyes, or preservatives Take medications that treat or prevent blood clots Pregnant or trying to get pregnant Breast-feeding How should I use this medication? This medication is injected into a vein. It is given by your care team in a hospital or clinic setting. A  special MedGuide will be given to you before each treatment. Be sure to read this information carefully each time. Talk to your care team about the use of this medication in children. Special care may be needed. Overdosage: If you think you have taken too much of this medicine contact a poison control center or emergency room at once. NOTE: This medicine is only for you. Do not share this medicine with others. What if I miss a dose? Keep appointments for follow-up doses. It is important not to miss your dose. Call your care team if you are unable to keep an appointment. What may interact with this medication? Interactions have not been studied. This list may not describe all possible interactions. Give your health care provider a list of all the medicines, herbs, non-prescription drugs, or dietary supplements you use. Also tell them if you smoke, drink alcohol, or use illegal drugs. Some items may interact with your medicine. What should I watch for while using this medication? Visit your care team for regular checks on your progress. Tell your care team if your symptoms do not start to get better or if they get worse. What side effects may I notice from receiving this medication? Side effects that you should report to your care team as soon as possible: Allergic reactions or angioedema--skin rash, itching or hives, swelling of the face, eyes, lips, tongue, arms, or legs, trouble swallowing or  breathing Headache, worsening confusion, dizziness, change in vision, nausea, seizures Infusion reactions--chest pain, shortness of breath or trouble breathing, feeling faint or lightheaded Side effects that usually do not require medical attention (report these to your care team if they continue or are bothersome): Cough Diarrhea Headache This list may not describe all possible side effects. Call your doctor for medical advice about side effects. You may report side effects to FDA at 1-800-FDA-1088. Where  should I keep my medication? This medication is given in a hospital or clinic. It will not be stored at home. NOTE: This sheet is a summary. It may not cover all possible information. If you have questions about this medicine, talk to your doctor, pharmacist, or health care provider.  2023 Elsevier/Gold Standard (2021-06-28 00:00:00)  Donepezil Patches What is this medication? DONEPEZIL (doe NEP e zil) treats memory loss and confusion (dementia) in people who have Alzheimer disease. It works by improving attention, memory, and the ability to engage in daily activities. It is not a cure for dementia or Alzheimer disease. This medicine may be used for other purposes; ask your health care provider or pharmacist if you have questions. COMMON BRAND NAME(S): ADLARITY What should I tell my care team before I take this medication? They need to know if you have any of these conditions: Heart disease Irregular heartbeat or rhythm Liver disease Lung or breathing disease (asthma, COPD) Seizures Stomach ulcers, other stomach or intestinal problems Trouble passing urine An unusual or allergic reaction to donepezil, other medications, foods, dyes, or preservatives Pregnant or trying to get pregnant Breast-feeding How should I use this medication? This medication is for external use only. Use it as directed on the prescription label. Apply the patch to your skin. Select a clean, dry area of skin on your back, but avoid the spine. The upper buttocks or upper, outer thigh may also be used. Do not apply the patch to broken, burned, cut, or irritated skin. Apply to an area of skin that does not have much hair. Do not shave the area of skin. Do not cut or trim the patch. Do not wear more than 1 patch at a time. Remove the old patch before using a new patch. Use a different site each time to prevent skin irritation. Keep using it unless your care team tells you to stop. Take one pouch from the refrigerator and  allow it to reach room temperature before opening the pouch. Take the patch out of the pouch. Do not use the patch if the packaging or patch is damaged. Take off the protective strip from the back of the patch. Press the sticky surface to your skin with the palm of your hand. Press the patch to the skin for 30 seconds. Wash your hands with soap and water. Take off the old patch before putting on a new patch. Apply each new patch to a different area of skin. Do not use the exact same area of skin for 14 days. If a patch comes off, apply a new patch to a different skin area. Then replace the patch 7 days later. This medication comes with INSTRUCTIONS FOR USE. Ask your pharmacist for directions on how to use this medication. Read the information carefully. Talk to your pharmacist or care team if you have questions. A patient package insert for the product will be given with each prescription and refill. Be sure to read this information carefully each time. Talk to your care team about the use of this medication  in children. Special care may be needed. Overdosage: If you think you have taken too much of this medicine contact a poison control center or emergency room at once. NOTE: This medicine is only for you. Do not share this medicine with others. What if I miss a dose? If you miss a dose, apply it as soon as you can. If it is almost time for your next dose, apply only that dose. Do not apply double or extra doses. What may interact with this medication? Do not take this medication with any of the following: Cisapride Dextromethorphan; quinidine Dronedarone Pimozide Quinidine Thioridazine This medication may also interact with the following: Antihistamines for allergy, cough and cold Atropine Bethanechol Carbamazepine Certain medications for bladder problems like oxybutynin, tolterodine Certain medications for fungal infections like itraconazole, fluconazole, posaconazole, and  voriconazole Certain medications for Parkinson's disease like benztropine, trihexyphenidyl Certain medications for stomach problems like dicyclomine, hyoscyamine Certain medications for travel sickness like scopolamine Dexamethasone Dofetilide Ipratropium NSAIDs, medications for pain and inflammation, like ibuprofen or naproxen Other medications for Alzheimer's disease Other medications that cause heart rhythm changes Phenobarbital Phenytoin Rifampin, rifabutin or rifapentine Ziprasidone This list may not describe all possible interactions. Give your health care provider a list of all the medicines, herbs, non-prescription drugs, or dietary supplements you use. Also tell them if you smoke, drink alcohol, or use illegal drugs. Some items may interact with your medicine. What should I watch for while using this medication? Visit your care team for regular checks on your progress. Check with your care team if your symptoms do not get better or if they get worse. This medication may affect your coordination, reaction time, or judgment. Do not drive or operate machinery until you know how this medication affects you. Sit up or stand slowly to reduce the risk of dizzy or fainting spells. Drinking alcohol with this medication can increase the risk of these side effects. This patch is sensitive to body heat changes. If your skin gets too hot, more medication will come out of the patch and can cause a deadly overdose. Call your care team if you get a fever. Do not take hot baths. Do not sunbath. Do not use hot tubs, saunas, hairdryers, heating pads, electric blankets, heated waterbeds, or tanning lamps. If you get black, tarry stools or vomit up what looks like coffee grounds, call your care team right away. You may have a bleeding ulcer. What side effects may I notice from receiving this medication? Side effects that you should report to your care team as soon as possible: Allergic reactions--skin  rash, itching, hives, swelling of the face, lips, tongue, or throat Peptic ulcer--burning stomach pain, loss of appetite, bloating, burping, heartburn, nausea, vomiting Seizures Slow heartbeat--dizziness, feeling faint or lightheaded, confusion, trouble breathing, unusual weakness or fatigue Stomach bleeding--bloody or black, tar-like stools, vomiting blood or brown material that looks like coffee grounds Trouble passing urine Side effects that usually do not require medical attention (report these to your care team if they continue or are bothersome): Diarrhea Fatigue Irritation at application site Loss of appetite Muscle pain or cramps Nausea Trouble sleeping Vomiting This list may not describe all possible side effects. Call your doctor for medical advice about side effects. You may report side effects to FDA at 1-800-FDA-1088. Where should I keep my medication? Keep out of the reach of children and pets. Store in the refrigerator. Do not freeze. Keep it in the original carton until you are ready to use it. Remove  one pouch from the carton and allow it to reach room temperature before opening the pouch. Use it within 24 hours of removing it from the refrigerator. Get rid of any unused medication after the expiration date. To get rid of medications that are no longer needed or expired: Take the medication to a medication take-back program. Check with your pharmacy or law enforcement to find a location. If you cannot return the medication, ask your pharmacist or care team how to get rid of this medication safely. NOTE: This sheet is a summary. It may not cover all possible information. If you have questions about this medicine, talk to your doctor, pharmacist, or health care provider.  2023 Elsevier/Gold Standard (2020-12-27 00:00:00) Memantine Tablets What is this medication? MEMANTINE (MEM an teen) treats memory loss and confusion (dementia) in people who have Alzheimer disease. It  works by improving attention, memory, and the ability to engage in daily activities. It is not a cure for dementia or Alzheimer disease. This medicine may be used for other purposes; ask your health care provider or pharmacist if you have questions. COMMON BRAND NAME(S): Namenda What should I tell my care team before I take this medication? They need to know if you have any of these conditions: Kidney disease Liver disease Seizures Trouble passing urine An unusual or allergic reaction to memantine, other medications, foods, dyes, or preservatives Pregnant or trying to get pregnant Breast-feeding How should I use this medication? Take this medication by mouth with water. Follow the directions on the prescription label. You may take this medication with or without food. Take your doses at regular intervals. Do not take your medication more often than directed. Continue to take your medication even if you feel better. Do not stop taking except on the advice of your care team. Talk to your care team about the use of this medication in children. Special care may be needed Overdosage: If you think you have taken too much of this medicine contact a poison control center or emergency room at once. NOTE: This medicine is only for you. Do not share this medicine with others. What if I miss a dose? If you miss a dose, take it as soon as you can. If it is almost time for your next dose, take only that dose. Do not take double or extra doses. If you do not take your medication for several days, contact your care team. Your dose may need to be changed. What may interact with this medication? Acetazolamide Amantadine Cimetidine Dextromethorphan Dofetilide Hydrochlorothiazide Ketamine Metformin Methazolamide Quinidine Ranitidine Sodium bicarbonate Triamterene This list may not describe all possible interactions. Give your health care provider a list of all the medicines, herbs, non-prescription  drugs, or dietary supplements you use. Also tell them if you smoke, drink alcohol, or use illegal drugs. Some items may interact with your medicine. What should I watch for while using this medication? Visit your care team for regular checks on your progress. Check with your care team if there is no improvement in your symptoms or if they get worse. This medication may affect your coordination, reaction time, or judgment. Do not drive or operate machinery until you know how this medication affects you. Sit up or stand slowly to reduce the risk of dizzy or fainting spells. Drinking alcohol with this medication can increase the risk of these side effects. What side effects may I notice from receiving this medication? Side effects that you should report to your care team as soon  as possible: Allergic reactions--skin rash, itching, hives, swelling of the face, lips, tongue, or throat Side effects that usually do not require medical attention (report to your care team if they continue or are bothersome): Confusion Constipation Diarrhea Dizziness Headache This list may not describe all possible side effects. Call your doctor for medical advice about side effects. You may report side effects to FDA at 1-800-FDA-1088. Where should I keep my medication? Keep out of the reach of children. Store at room temperature between 15 degrees and 30 degrees C (59 degrees and 86 degrees F). Throw away any unused medication after the expiration date. NOTE: This sheet is a summary. It may not cover all possible information. If you have questions about this medicine, talk to your doctor, pharmacist, or health care provider.  2023 Elsevier/Gold Standard (2021-06-16 00:00:00)  Donepezil Patches What is this medication? DONEPEZIL (doe NEP e zil) treats memory loss and confusion (dementia) in people who have Alzheimer disease. It works by improving attention, memory, and the ability to engage in daily activities. It is  not a cure for dementia or Alzheimer disease. This medicine may be used for other purposes; ask your health care provider or pharmacist if you have questions. COMMON BRAND NAME(S): ADLARITY What should I tell my care team before I take this medication? They need to know if you have any of these conditions: Heart disease Irregular heartbeat or rhythm Liver disease Lung or breathing disease (asthma, COPD) Seizures Stomach ulcers, other stomach or intestinal problems Trouble passing urine An unusual or allergic reaction to donepezil, other medications, foods, dyes, or preservatives Pregnant or trying to get pregnant Breast-feeding How should I use this medication? This medication is for external use only. Use it as directed on the prescription label. Apply the patch to your skin. Select a clean, dry area of skin on your back, but avoid the spine. The upper buttocks or upper, outer thigh may also be used. Do not apply the patch to broken, burned, cut, or irritated skin. Apply to an area of skin that does not have much hair. Do not shave the area of skin. Do not cut or trim the patch. Do not wear more than 1 patch at a time. Remove the old patch before using a new patch. Use a different site each time to prevent skin irritation. Keep using it unless your care team tells you to stop. Take one pouch from the refrigerator and allow it to reach room temperature before opening the pouch. Take the patch out of the pouch. Do not use the patch if the packaging or patch is damaged. Take off the protective strip from the back of the patch. Press the sticky surface to your skin with the palm of your hand. Press the patch to the skin for 30 seconds. Wash your hands with soap and water. Take off the old patch before putting on a new patch. Apply each new patch to a different area of skin. Do not use the exact same area of skin for 14 days. If a patch comes off, apply a new patch to a different skin area. Then  replace the patch 7 days later. This medication comes with INSTRUCTIONS FOR USE. Ask your pharmacist for directions on how to use this medication. Read the information carefully. Talk to your pharmacist or care team if you have questions. A patient package insert for the product will be given with each prescription and refill. Be sure to read this information carefully each time. Talk  to your care team about the use of this medication in children. Special care may be needed. Overdosage: If you think you have taken too much of this medicine contact a poison control center or emergency room at once. NOTE: This medicine is only for you. Do not share this medicine with others. What if I miss a dose? If you miss a dose, apply it as soon as you can. If it is almost time for your next dose, apply only that dose. Do not apply double or extra doses. What may interact with this medication? Do not take this medication with any of the following: Cisapride Dextromethorphan; quinidine Dronedarone Pimozide Quinidine Thioridazine This medication may also interact with the following: Antihistamines for allergy, cough and cold Atropine Bethanechol Carbamazepine Certain medications for bladder problems like oxybutynin, tolterodine Certain medications for fungal infections like itraconazole, fluconazole, posaconazole, and voriconazole Certain medications for Parkinson's disease like benztropine, trihexyphenidyl Certain medications for stomach problems like dicyclomine, hyoscyamine Certain medications for travel sickness like scopolamine Dexamethasone Dofetilide Ipratropium NSAIDs, medications for pain and inflammation, like ibuprofen or naproxen Other medications for Alzheimer's disease Other medications that cause heart rhythm changes Phenobarbital Phenytoin Rifampin, rifabutin or rifapentine Ziprasidone This list may not describe all possible interactions. Give your health care provider a list of  all the medicines, herbs, non-prescription drugs, or dietary supplements you use. Also tell them if you smoke, drink alcohol, or use illegal drugs. Some items may interact with your medicine. What should I watch for while using this medication? Visit your care team for regular checks on your progress. Check with your care team if your symptoms do not get better or if they get worse. This medication may affect your coordination, reaction time, or judgment. Do not drive or operate machinery until you know how this medication affects you. Sit up or stand slowly to reduce the risk of dizzy or fainting spells. Drinking alcohol with this medication can increase the risk of these side effects. This patch is sensitive to body heat changes. If your skin gets too hot, more medication will come out of the patch and can cause a deadly overdose. Call your care team if you get a fever. Do not take hot baths. Do not sunbath. Do not use hot tubs, saunas, hairdryers, heating pads, electric blankets, heated waterbeds, or tanning lamps. If you get black, tarry stools or vomit up what looks like coffee grounds, call your care team right away. You may have a bleeding ulcer. What side effects may I notice from receiving this medication? Side effects that you should report to your care team as soon as possible: Allergic reactions--skin rash, itching, hives, swelling of the face, lips, tongue, or throat Peptic ulcer--burning stomach pain, loss of appetite, bloating, burping, heartburn, nausea, vomiting Seizures Slow heartbeat--dizziness, feeling faint or lightheaded, confusion, trouble breathing, unusual weakness or fatigue Stomach bleeding--bloody or black, tar-like stools, vomiting blood or brown material that looks like coffee grounds Trouble passing urine Side effects that usually do not require medical attention (report these to your care team if they continue or are bothersome): Diarrhea Fatigue Irritation at  application site Loss of appetite Muscle pain or cramps Nausea Trouble sleeping Vomiting This list may not describe all possible side effects. Call your doctor for medical advice about side effects. You may report side effects to FDA at 1-800-FDA-1088. Where should I keep my medication? Keep out of the reach of children and pets. Store in the refrigerator. Do not freeze. Keep it in the  original carton until you are ready to use it. Remove one pouch from the carton and allow it to reach room temperature before opening the pouch. Use it within 24 hours of removing it from the refrigerator. Get rid of any unused medication after the expiration date. To get rid of medications that are no longer needed or expired: Take the medication to a medication take-back program. Check with your pharmacy or law enforcement to find a location. If you cannot return the medication, ask your pharmacist or care team how to get rid of this medication safely. NOTE: This sheet is a summary. It may not cover all possible information. If you have questions about this medicine, talk to your doctor, pharmacist, or health care provider.  2023 Elsevier/Gold Standard (2020-12-27 00:00:00)

## 2022-05-15 NOTE — Progress Notes (Unsigned)
[facility]    Provider:  Dr Jaynee Eagles Requesting Provider: Wardell Honour, MD Primary Care Provider:  Wardell Honour, MD  CC: cognitive decline  05/15/2022: This is a patient we have been following for patient reported memory loss.  Last time I saw her was in April, she had formal memory testing completed, largely inconclusive, she was diagnosed with mild cognitive impairment amnestic type and she has a long family history of Alzheimer's with the possibility this could be prodromal Alzheimer's.  She is here for follow-up, SHe had an episode of confusion likely due to UTI. Could not tolerate aricept, diarrhea. Other anticholinestase medications likely to do the same. Difficulty swallowing pills.  We discussed restarting Aricept patch, other medications such as Namenda, we also had a very long conversation about the new monoclonal antibody that is on the market just approved by the FDA in July, we also talked about melatonin which is fine she has had a problem with sleeping, she wakes up with dry mouth, she snores.  We also talked about FDG PET scan and PET amyloid scan.  I answered all questions. Patient complains of symptoms per HPI as well as the following symptoms: none . Pertinent negatives and positives per HPI. All others negative  09/12/2021: Here for followup after formal memory testing completed.  Memory testing was largely inconclusive, I reviewed Dr. McDermott's notes with patient, but patient never followed back up with Dr. Vikki Ports so that he could review all the testing with her which I highly recommend and asked them to call the office.  She was diagnosed with mild cognitive impairment amnestic type, she has a long family history of Alzheimer's, could be prodromal Alzheimer's at this time unclear.  She will need to undergo a repeat likely in a year if her symptoms worsen, in the meantime given the uncertainty of her memory loss I am going to recommend an FDG PET scan  which is highly sensitive and specific to Alzheimer's versus frontotemporal dementia.  No suspicion for vascular dementia given her MRI of the brain.  Anesthesia and normal cognitive aging could be contributory, but although she was diagnosed with MCI: "Test results revealed intact functioning across most cognitive domains and thinking skills assessed during this evaluation with the exception of exceptionally low performance on certain aspects of memory (i.e. new learning and free recall of contextual information and visually presented material), but with intact verbal free recall and recognition. Other intact areas included executive functions, attention and processing speed, language, and visuospatial judgment. From an emotional standpoint, on self-report measures, there appears to be at least a mild degree of recent depression.  Ms. Everetts performance evidences mild neurocognitive challenges primarily manifesting as difficulties with certain aspects of learning and memory. At present, an exact etiology is unclear, though we have to entertain the possibility of a prodromal neurodegenerative process but nothing has been substantiated of yet. It is possible that the surgery and subsequent complications could be playing a role (or at least contributing) but further follow-up and monitoring is recommended  REFERRING DIAGNOSIS:  Memory changes   FINAL DIAGNOSIS (ICD-10 considerations):  Mild Neurocognitive Disorder (i.e., mild cognitive impairment), amnestic (etiology unclear)"   06/28/2021: She has major low back surgery in 2022 and she was under anesthesia for a long time. She feels her memory is impaired since, she can't remember names, she can't remember if she took her pills. She hs alzheimer's and strokes in her family. She is perfoeming hr own IADLs and ADLs.  Her husband has to check your bills. She drives fine. No accidents in the home, people are noticing hr husband. She is having word-finding  difficulty.   MRI brain 06/2021: IMPRESSION: personally reviewed and agree with findings (addition 10 minutes to appointment)   MRI brain (with and without) demonstrating: - Stable, mild periventricular and subcortical foci of nonspecific T2 hyperintensities.  No abnormal lesions are seen on post contrast views.   - No acute findings.  Labs 07/07/2021: B12 folate b1, mma, homocysteine, tsh unremarkable   Patient complains of symptoms per HPI as well as the following symptoms: MCI . Pertinent negatives and positives per HPI. All others negative    HPI:  Carol Perez is a 81 y.o. female here as requested by Wardell Honour, MD for head pain. She woke up one morning with pain in the right scalp. Impulses. They got severe that evening, she couldn't sleep, she tried CBD and melatonin, shooting pain behind the ear, not burning, she took Gabapentin (she had some for her low back pain and sciatica). She has seen a neurosurgeon and he was worried about her neck. She has a lot of neck pain. The pain is gone. The symptoms are better and she has not had it for a few days. Her low back is better. But she has a lot of neck problems, she has seen a neurosurgeon in the past. She exercises at least 5 morning a week, she exercises her neck. She has tightness in her neck. She has very tight neck muscles.   Reviewed notes, labs and imaging from outside physicians, which showed:  Personally reviewed images of MRI of the brain and MRI trigeminal protocol, essentially normal for age, some very minimal white matter changes, no vascular compression.  Bmp,cbc unremarkable  Reviewed notes from emergency room visit where she was seen for right temporal sharp pain which does not radiate she reported 7 out of 10 pain which was sudden and lasting for 2 days waxing and waning no worsening to bright light coughing or straining, no other associated symptoms, examination and neurologic examination were unremarkable and nonfocal.   She did not have tenderness in the area, her pains could not be provoked, there were no rashes or focal deficits on exam, normal gait, they were concerned for trigeminal neuralgia.  She was given gabapentin 3 times daily.  Review of Systems: Patient complains of symptoms per HPI as well as the following symptoms: memory loss . Pertinent negatives and positives per HPI. All others negative    Social History   Socioeconomic History   Marital status: Married    Spouse name: Not on file   Number of children: 2   Years of education: Not on file   Highest education level: Associate degree: occupational, Hotel manager, or vocational program  Occupational History   Occupation: Retired   Tobacco Use   Smoking status: Former    Types: Cigarettes    Start date: 06/12/1977   Smokeless tobacco: Never   Tobacco comments:    42 years ago. Smoked from age [age]  Vaping Use   Vaping Use: Never used  Substance and Sexual Activity   Alcohol use: Yes    Alcohol/week: 7.0 standard drinks of alcohol    Types: 7 Shots of liquor per week    Comment: one gin and tonic at night   Drug use: No   Sexual activity: Not on file  Other Topics Concern   Not on file  Social History Narrative  Tobacco use, amount per day now: No   Past tobacco use, amount per day: Smoked ages [age]   How many years did you use tobacco: 17-20 years   Alcohol use (drinks per week): 1 glass of Red Wine per day.   Diet: Good   Do you drink/eat things with caffeine: No   Marital status: Married                                  What year were you married? 1978   Do you live in a house, apartment, assisted living, condo, trailer, etc.? Apartment/Independent Living   Is it one or more stories? 1   How many persons live in your home? 2    Do you have pets in your home?( please list) No   Highest Level of Education completed? 2 years of college.    Current or past profession: Retired Futures trader   Do you exercise?  Yes                                 Type and how often? 77mn workout 5 days week. Walking 3-4 Days a Week.    Do you have a living will? Yes   Do you have a DNR form?  Yes                                 If not, do you want to discuss one?   Do you have signed POA/HPOA forms? Yes                       If so, please bring to you appointment      Do you have difficulty bathing or dressing yourself? No   Do you have difficulty preparing food or eating? No   Do you have difficulty managing your medications? No   Do you have difficulty managing your finances? No   Do you have difficulty affording your medications? No       [facility]. Abstracted by Jasmine/RMA.    Social Determinants of Health   Financial Resource Strain: Not on file  Food Insecurity: Not on file  Transportation Needs: Not on file  Physical Activity: Not on file  Stress: Not on file  Social Connections: Not on file  Intimate Partner Violence: Not on file    Family History  Problem Relation Age of Onset   Cancer Mother    Dementia Mother    Alzheimer's disease Mother    Cancer Father    Alzheimer's disease Maternal Grandmother    Dementia Maternal Grandmother    Alzheimer's disease Maternal Grandfather     Past Medical History:  Diagnosis Date   Barrett's esophagus    Colitis    DDD (degenerative disc disease)    Factor 5 Leiden mutation, heterozygous ([facility]    [facility].   Frequent UTI    H/O echocardiogram 04/01/2019   [facility].   Hiatal hernia    [facility].   High grade dysplasia of Barrett's epithelium    [facility]   History of bladder infections    [facility]   History of Papanicolaou smear of cervix  10/16/2011   Per [place] New Patient Packet   Hypoglycemia    Hypotension    Per [facility] New Patient Packet.   IBS (irritable bowel syndrome)    Per [place] New Patient Packet.   Lactose intolerance    Osteoarthritis     Per [place] New Patient Packet.   Osteopenia    Per [place] New Patient Packet.   Osteoporosis    Premature ventricular complex    Raynaud's disease    Per [place] Patient Packet.   Scoliosis    Per [facility] New Patient Packet.    Patient Active Problem List   Diagnosis Date Noted   MCI (mild cognitive impairment) 05/16/2022   FHx: Alzheimer's disease 05/16/2022   Nocturnal leg cramps 11/23/2021   Overactive bladder 11/23/2021   Depression, recurrent ([place]) 08/24/2020   Atrophic vaginitis 08/24/2020   Blood loss anemia 08/24/2020   Hyponatremia    Lumbar scoliosis 08/12/2020   Occipital neuralgia of right side 09/08/2019   Recurrent UTI 06/26/2019   Osteoporosis 06/26/2019   IBS (irritable bowel syndrome) 06/26/2019   Hyperlipidemia 06/26/2019   Back pain 06/26/2019   Barrett's esophagus 06/26/2019    Past Surgical History:  Procedure Laterality Date   ABDOMINAL EXPOSURE N/A 08/12/2020   Procedure: ABDOMINAL EXPOSURE;  Surgeon: Serafina Mitchell, MD;  Location: [place];  Service: Vascular;  Laterality: N/A;   ABDOMINAL HYSTERECTOMY  06/13/2011   By Dr[facility]Scherer at [facility]. Per [facility] New Patient Packet.   ANTERIOR LAT LUMBAR FUSION Right 08/12/2020   Procedure: Right Lumbar two-three, Lumbar three-four, Lumbar four-five Anterolateral lumbar interbody fusion;  Surgeon: Erline Levine, MD;  Location: [place];  Service: Neurosurgery;  Laterality: Right;   ANTERIOR LUMBAR FUSION N/A 08/12/2020   Procedure: Lumbar five Sacral one Anterior lumbar interbody fusion;  Surgeon: Erline Levine, MD;  Location: [place];  Service: Neurosurgery;  Laterality: N/A;   BREAST EXCISIONAL BIOPSY Left 30+ yrs ago   COLONOSCOPY  11/23/2011   By Dr.McCune at [facility]. Per [place] Patient Packet.   COLONOSCOPY  08/26/2019   LUMBAR PERCUTANEOUS PEDICLE SCREW 4 LEVEL N/A 08/12/2020   Procedure: Percutaneous pedicle screw fixation from Lumbar two to Sacral one ;  Surgeon: Erline Levine, MD;  Location: [place];  Service: Neurosurgery;  Laterality: N/A;   MAMMOGRAM  04/14/2019   pap smear  10/16/2011   SIGMOIDOSCOPY  11/02/2014   Per [place] New Patient Packet   TONSILLECTOMY  06/12/1946   Per [facility] New Patient Packet.    Current Outpatient Medications  Medication Sig Dispense Refill   aspirin EC 81 MG tablet Take 81 mg by mouth 3 (three) times daily.     atorvastatin (LIPITOR) 10 MG tablet Take 1 tablet (10 mg total) by mouth daily. 90 tablet 1   AZO-CRANBERRY PO Take 1 capsule by mouth as needed (UTI Symptoms).     Bismuth Subsalicylate (PEPTO-BISMOL PO) Take by mouth as needed.     buPROPion (WELLBUTRIN XL) 150 MG 24 hr tablet Take 1 tablet (150 mg total) by mouth daily. 30 tablet 3   CALCIUM PO Take 1,200 mg by mouth daily.      Cholecalciferol (VITAMIN D3) 50 MCG (2000 UT) TABS Take 2,000 Units by mouth in the morning, at noon, and at bedtime.     conjugated estrogens (PREMARIN) vaginal cream Place 1 applicator vaginally once a week.     denosumab (PROLIA) 60 MG/ML SOSY injection Inject 60 mg into the skin every 6 (six) months.  Diclofenac Sodium (MOTRIN ARTHRITIS PAIN EX) Apply topically.     Donepezil HCl (ADLARITY) 5 MG/DAY PTWK Place 5 mg onto the skin once a week. 4 patch 0   Glucosamine HCl 1500 MG TABS Take 1,500 mg by mouth daily.     Homeopathic Products (SIMILASAN DRY EYE RELIEF OP) Apply to eye.     memantine (NAMENDA) 10 MG tablet Start with one pill at bedtime and in 1 -2 weeks if no side effects increase to twice daily 60 tablet 6   mesalamine (LIALDA) 1.2 g EC tablet Take 1.2 g by mouth daily.     methenamine (HIPREX) 1 g tablet Take 1 g by mouth 2 (two) times daily with a meal. For UTI Prevention. Last UTI: October 20th, November 5th, and December 28th.     Multiple Vitamin (MULTIVITAMIN WITH MINERALS) TABS tablet Take 1 tablet by mouth daily. One A Day for Women     Omeprazole-Sodium Bicarbonate (ZEGERID) 20-1100 MG CAPS capsule Take 1 capsule by mouth 2 (two) times daily.      OVER THE COUNTER MEDICATION Place 1 drop into both ears as needed. Grapefruit Seed Extract Oil     Polyethyl Glycol-Propyl Glycol (SYSTANE) 0.4-0.3 % SOLN Place 1 drop into both eyes as needed (dry/irritated eyes).     Probiotic Product (PROBIOTIC COLON SUPPORT PO) Take 1 tablet by mouth in the morning and at bedtime.     psyllium (METAMUCIL) 58.6 % powder Take 1 packet by mouth 3 (three) times daily.     valACYclovir (VALTREX) 500 MG tablet Take 1 tablet (500 mg total) by mouth daily as needed. 270 tablet 0   fluticasone (FLONASE) 50 MCG/ACT nasal spray Place 1 spray into both nostrils as needed for allergies or rhinitis. (Patient not taking: Reported on 05/15/2022)     Loperamide-Simethicone 2-125 MG TABS Take 1 tablet by mouth daily. (Patient not taking: Reported on 05/15/2022)     No current facility-administered medications for this visit.    Allergies as of 05/15/2022 - Review Complete 05/15/2022  Allergen Reaction Noted   Banana Swelling 09/08/2019   Latex Swelling, Rash, and Other (See Comments) 01/14/2011   Ciprofloxacin hcl Other (See Comments) 08/03/2013   Gatifloxacin Other (See Comments) 08/03/2013   Levofloxacin Other (See Comments) 08/03/2013   Moxifloxacin Other (See Comments) 08/03/2013   Norfloxacin Other (See Comments) 08/03/2013   Nsaids Other (See Comments) 01/14/2011   Ofloxacin Other (See Comments) 08/03/2013   Other Other (See Comments) 09/08/2019   Penicillins Hives 01/14/2011   Methylisothiazolinone Rash and Other (See Comments) 04/12/2017   Tape Rash 04/12/2017    Vitals: BP (!) 142/75 (BP Location: Right Arm, Patient Position: Sitting, Cuff Size: Normal)   Pulse 69   Ht '5\' 1"'$  (1.549 m)   Wt 143 lb 3.2 oz (65 kg)   BMI 27.06 kg/m  Last Weight:  Wt Readings from Last 1 Encounters:  05/15/22 143 lb 3.2 oz (65 kg)   Last Height:   Ht Readings from Last 1 Encounters:  05/15/22 '5\' 1"'$  (1.549 m)   Exam: NAD, pleasant                  Speech:     Speech is normal; fluent and spontaneous with normal comprehension.  Cognition:     05/15/2022   11:05 AM 03/28/2022    2:51 PM 01/04/2022    2:48 PM  MMSE - Mini Mental State Exam  Orientation to time '5 3 5  '$ Orientation to Place 5  5 4  Registration '3 3 3  '$ Attention/ Calculation '2 5 5  '$ Recall 3 3 0  Language- name 2 objects '2 2 2  '$ Language- repeat '1 1 1  '$ Language- follow 3 step command '3 3 3  '$ Language- read & follow direction '1 1 1  '$ Write a sentence '1 1 1  '$ Copy design '1 1 1  '$ Total score '27 28 26       '$ The patient is oriented to person, place, and time;     recent and remote memory intact;     language fluent;    Cranial Nerves:    The pupils are equal, round, and reactive to light.Trigeminal sensation is intact and the muscles of mastication are normal. The face is symmetric. The palate elevates in the midline. Hearing intact. Voice is normal. Shoulder shrug is normal. The tongue has normal motion without fasciculations.   Coordination:  No dysmetria  Motor Observation:    No asymmetry, no atrophy, and no involuntary movements noted. Tone:    Normal muscle tone.     Strength:    Strength is V/V in the upper and lower limbs.      Sensation: intact to LT    Assessment/Plan: This is a really lovely 81 year old with memory loss. MRI unremarkable for age, EEG. Formal memory tsting with Dr. Vikki Ports revealed MCI amnestic type possibly per prodromal Alzheimer's and she has a family history of Alzheimer's  1.start Namenda/Memantine first/ now. (we eventually want you on BOTH aricept/donepeazil and Namenda/memantine but start namenda first and when we know you are stable in 4-6 weeks can try the Aricept patch. We do not want to start 2 meds at the same time just in case there are side effects we will not know which one is the cause.   2. Aricept Patch - keep in the fridage, wait and start until AFTER you are on namenda/menantine twice daily and doing well 4-6 weeks  3. Both of  the above are used at the same time concurrently to slow down memory loss.   4. Consider Lecanemab - new, just approved by FDA in July, provided information and answered questions, will check for markers of Alzheimer's in the blood, FDG PET scan, and genetic testing  5. Melatonin ok at night 1-5 mg at bedtime.   6. Wakes up with dry mouth, she snores, naps, memory loss, would like to send her for sleep test, wakes often. Sleep apnea can be a risk factor for dementia  7. FDG PET Scan - this is a scan that can tell us in the FUTURE if you may develop alzheimers disease, she wants to go forward  8. If you decide you are intrested in lecanemab we would then perform a PET Amyloid scan to see if you have amyloid in the brain right NOW  Orders Placed This Encounter  Procedures   Culture, Urine   Urine Culture   NM PET Metabolic Brain   APOE Alzheimer's Risk   ATN PROFILE   Urinalysis, Routine w reflex microscopic   CBC with Differential/Platelets   Comprehensive metabolic panel   TSH Rfx on Abnormal to Free T4   APOE Alzheimer's Risk   Ambulatory referral to Sleep Studies   Meds ordered this encounter  Medications   memantine (NAMENDA) 10 MG tablet    Sig: Start with one pill at bedtime and in 1 -2 weeks if no side effects increase to twice daily    Dispense:  60 tablet  Refill:  6   Donepezil HCl (ADLARITY) 5 MG/DAY PTWK    Sig: Place 5 mg onto the skin once a week.    Dispense:  4 patch    Refill:  0     Reviewed the following with patient and husband from dr. Mcdermott's notes:  SUMMARY & IMPRESSION: Mrs. April Colter is a 81 year old, right-handed, White female, who reported experiencing mild cognitive challenges that began within the past 2 years and were exacerbated approximately 1 year ago by a major spinal surgery that took place (March 2022). She reported some complications following the surgery that prolonged her hospitalization to include hyponatremia and pneumonia. While  she feels that she has improved to a degree, she has yet to return to baseline.  Test results revealed intact functioning across most cognitive domains and thinking skills assessed during this evaluation with the exception of exceptionally low performance on certain aspects of memory (i.e. new learning and free recall of contextual information and visually presented material), but with intact verbal free recall and recognition. Other intact areas included executive functions, attention and processing speed, language, and visuospatial judgment. From an emotional standpoint, on self-report measures, there appears to be at least a mild degree of recent depression.  Ms. Westby performance evidences mild neurocognitive challenges primarily manifesting as difficulties with certain aspects of learning and memory. At present, an exact etiology is unclear, though we have to entertain the possibility of a prodromal neurodegenerative process but nothing has been substantiated of yet. It is possible that the surgery and subsequent complications could be playing a role (or at least contributing) but further follow-up and monitoring is recommended  REFERRING DIAGNOSIS:  Memory changes   FINAL DIAGNOSIS (ICD-10 considerations):  Mild Neurocognitive Disorder (i.e., mild cognitive impairment), amnestic (etiology unclear)  RECOMMENDATIONS: 1. Follow-up with Dr. Jaynee Eagles.   Could consider treatment with cholinesterase inhibitor.   It is recommended that the patient aggressively manage any modifiable risk factors for further cognitive decline such as strict compliance with prescribed medical treatments for any cerebrovascular risk factors (e.g., high cholesterol, high blood pressure, sleep apnea, diabetes).  Follow-up with the [facility] is recommended in 12 months for educational purposes, psychosocial concerns, and neurocognitive testing as  necessary. If you wish to make this follow-up appointment, please contact our office at [phone].  2. In response to the growing number of families affected by memory loss and dementia, the Memory Counseling Program (MCP) was established in 2011 with the support of the Section on Gerontology and Geriatric Medicine at [facility]. The MCP provides counseling services for individuals diagnosed with mild cognitive impairment (MCI), Alzheimer's disease, or another form of dementia, as well as to their family members. Services include individual, couple, and family counseling, as well as support groups, all of which provide a safe environment to talk about the journey with mild cognitive impairment or dementia, learn as much as possible about the disease, problem solve some of the common challenges encountered with memory and cognitive loss, and strengthen relationships.  The MCP is staffed by licensed practitioners. All services are rendered in a professional manner consistent with the ethical standards of the American [employer] and the [employer].  The MCP is located in the [facility] on the [place]. In general, we see clients every few weeks or monthly, depending on the need. Sessions are typically 45-60 minutes long, scheduled at  mutually agreed upon times. Referrals can be made by a health care professional or by self-referral.   Contact: [phone]  3. The patient is encouraged to attend to lifestyle factors for brain health (e.g., regular physical exercise, good nutrition habits, regular participation in cognitively-stimulating activities, and general stress management techniques), which are likely to have benefits for both emotional adjustment and cognition. In fact, in addition to promoting general good health, regular exercise incorporating aerobic activities (e.g., brisk walking, jogging, bicycling, etc.)  has been demonstrated to be a very effective treatment for depression and stress, with similar efficacy rates to both antidepressant medication and psychotherapy. And for those with orthopedic issues, water aerobics may be particularly beneficial.  4. Nutritional factors can have a significant effect on psychological and emotional status, as well as overall brain functioning. The following general recommendations have been associated with improvements in depression and other psychological symptoms, as well as lower risk for dementia and other forms of cognitive impairment. Please discuss these recommendations with your physician and/or dietitian before initiating:   Consume a wide variety of fresh fruits and vegetables, particularly including brightly colored items such as berries, oranges, tomatoes, peppers, carrots, broccoli, spinach, dark green lettuces, sweet potatoes, etc., all of which are high in vitamins and antioxidants.   Consume foods that are high in fiber, such as legumes (e.g., beans, peas, lentils) and foods made from whole grains (e.g., whole wheat bread and pasta)   Consume a significant amount of omega-3 essential fats and oils. These can be found in natural food sources such as salmon and other fatty fish, and also products made from flax seed and flax seed oil. Alternately, dietary supplementation with fish oil capsules and flax seed oil capsules is a good way to boost one's level of omega-3 consumption. It is important to check with your doctor before taking these supplements, especially if you take blood thinning medication.   If you do not already do so, consider taking a quality multivitamin supplement under the guidance of your primary care physician.    Consider keeping consumption of the following foods to a minimum: 1) foods made from white flour and white sugar; 2) artificial sweeteners; 3) deep-fried foods; 4) animal fat other than fish; 5) any foods containing  "hydrogenated" or "trans" fats; 6) most other types of highly processed packaged/prepared foods.   5. Try to keep in mind that common word finding errors are not necessarily the start of a dreadful decline. Over-focusing on these errors can contribute to further distraction and emotions that can detract from effective retrieval of words; this in turn, can lead to greater distress and more difficulties with recalling the specific word you were looking for to begin with.   The following are several strategies that may help:   Performance will generally be best in a structured, routine, and familiar environment, as opposed to situations involving complex problems.   Designate a place to keep your keys, wallet, cell phone, and other personal belongings.  Take time to register and process information to be remembered. Deeper encoding of information can be gained by forming a mental picture, making meaningful associations, connecting new information to previously learned and related information, paraphrasing and repetition.   To the extent possible, multitasking should be avoided; break down tasks into smaller steps to help get started and to keep from feeling overwhelmed. And if there are difficulties in organization and planning, maintaining a daily organizer to help keep track of important appointments and information may be  beneficial.   Memory problems may at least be minimally addressed using compensatory strategies such as the use of a daily schedule to follow, memos, portable recorder, a centrally located bulletin board, or memory notebook. A large calendar, placed in a highly visible location would be valuable to keep track of dates and appointments. In addition, it would be helpful to keep a log of all of medical appointments with the name of the doctor, date of visit, diagnoses, and treatments.   Use of a medication box is recommended to ensure compliance and decrease confusion regarding medication  dosages, times, and dates.  To aid in attention, the patient may consider using some of the following strategies: o The patient should simplify tasks. There may be a need to break overly complex activities into simple step-by-step tasks, keep these steps written down in a note book and then check them off as they are completed which will help to stay on task and make sure the whole task is finished.  o The patient should set deadlines for everything, even for seemingly small tasks, prioritize time-sensitive tasks and write down every assignment, message, or important thought. o The patient is encouraged to use timers and alarms to stay on track and take breaks at regular intervals. Avoid piles of paperwork or procrastination by dealing with each item as it comes in.      Cc: Wardell Honour, MD,  Wardell Honour, MD  Sarina Ill, MD  [facility] [address] [place], [place] [address]  Phone [phone] Fax [phone]  I spent over 75 minutes of face-to-face and non-face-to-face time with patient on the  1. MCI (mild cognitive impairment)   2. FHx: Alzheimer's disease   3. Alzheimer disease ([place])   4. Confusion and disorientation   5. Alzheimer's disease, unspecified (CODE) ([place])   6. Snoring   7. Daytime somnolence   8. Urinary tract infection without hematuria, site unspecified     diagnosis.  This included previsit chart review, lab review, study review, order entry, electronic health record documentation, patient education on the different diagnostic and therapeutic options, counseling and coordination of care, risks and benefits of management, compliance, or risk factor reduction

## 2022-05-16 DIAGNOSIS — G3184 Mild cognitive impairment, so stated: Secondary | ICD-10-CM | POA: Insufficient documentation

## 2022-05-16 DIAGNOSIS — Z820 Family history of epilepsy and other diseases of the nervous system: Secondary | ICD-10-CM | POA: Insufficient documentation

## 2022-05-16 DIAGNOSIS — F0280 Dementia in other diseases classified elsewhere without behavioral disturbance: Secondary | ICD-10-CM | POA: Insufficient documentation

## 2022-05-19 DIAGNOSIS — H04123 Dry eye syndrome of bilateral lacrimal glands: Secondary | ICD-10-CM | POA: Diagnosis not present

## 2022-05-19 LAB — URINE CULTURE

## 2022-05-19 LAB — URINALYSIS, ROUTINE W REFLEX MICROSCOPIC
Bilirubin, UA: NEGATIVE
Glucose, UA: NEGATIVE
Ketones, UA: NEGATIVE
Leukocytes,UA: NEGATIVE
Nitrite, UA: NEGATIVE
Protein,UA: NEGATIVE
RBC, UA: NEGATIVE
Specific Gravity, UA: 1.008 (ref 1.005–1.030)
Urobilinogen, Ur: 0.2 mg/dL (ref 0.2–1.0)
pH, UA: 7.5 (ref 5.0–7.5)

## 2022-05-19 LAB — CBC WITH DIFFERENTIAL/PLATELET
Basophils Absolute: 0.1 10*3/uL (ref 0.0–0.2)
Basos: 1 %
EOS (ABSOLUTE): 0 10*3/uL (ref 0.0–0.4)
Eos: 0 %
Hematocrit: 40 % (ref 34.0–46.6)
Hemoglobin: 13.3 g/dL (ref 11.1–15.9)
Immature Grans (Abs): 0 10*3/uL (ref 0.0–0.1)
Immature Granulocytes: 0 %
Lymphocytes Absolute: 1.3 10*3/uL (ref 0.7–3.1)
Lymphs: 21 %
MCH: 32.6 pg (ref 26.6–33.0)
MCHC: 33.3 g/dL (ref 31.5–35.7)
MCV: 98 fL — ABNORMAL HIGH (ref 79–97)
Monocytes Absolute: 0.7 10*3/uL (ref 0.1–0.9)
Monocytes: 12 %
Neutrophils Absolute: 4.1 10*3/uL (ref 1.4–7.0)
Neutrophils: 66 %
Platelets: 254 10*3/uL (ref 150–450)
RBC: 4.08 x10E6/uL (ref 3.77–5.28)
RDW: 12 % (ref 11.7–15.4)
WBC: 6.1 10*3/uL (ref 3.4–10.8)

## 2022-05-19 LAB — COMPREHENSIVE METABOLIC PANEL
ALT: 24 IU/L (ref 0–32)
AST: 31 IU/L (ref 0–40)
Albumin/Globulin Ratio: 1.9 (ref 1.2–2.2)
Albumin: 4.8 g/dL (ref 3.8–4.8)
Alkaline Phosphatase: 78 IU/L (ref 44–121)
BUN/Creatinine Ratio: 13 (ref 12–28)
BUN: 14 mg/dL (ref 8–27)
Bilirubin Total: 0.3 mg/dL (ref 0.0–1.2)
CO2: 25 mmol/L (ref 20–29)
Calcium: 10.4 mg/dL — ABNORMAL HIGH (ref 8.7–10.3)
Chloride: 102 mmol/L (ref 96–106)
Creatinine, Ser: 1.09 mg/dL — ABNORMAL HIGH (ref 0.57–1.00)
Globulin, Total: 2.5 g/dL (ref 1.5–4.5)
Glucose: 90 mg/dL (ref 70–99)
Potassium: 5.2 mmol/L (ref 3.5–5.2)
Sodium: 141 mmol/L (ref 134–144)
Total Protein: 7.3 g/dL (ref 6.0–8.5)
eGFR: 52 mL/min/{1.73_m2} — ABNORMAL LOW (ref >59)

## 2022-05-19 LAB — APOE ALZHEIMER'S RISK

## 2022-05-19 LAB — TSH RFX ON ABNORMAL TO FREE T4: TSH: 3.22 u[IU]/mL (ref 0.450–4.500)

## 2022-05-22 ENCOUNTER — Encounter: Payer: Self-pay | Admitting: Nurse Practitioner

## 2022-05-22 ENCOUNTER — Telehealth: Payer: Self-pay | Admitting: Neurology

## 2022-05-22 ENCOUNTER — Telehealth: Payer: Self-pay | Admitting: *Deleted

## 2022-05-22 ENCOUNTER — Ambulatory Visit (INDEPENDENT_AMBULATORY_CARE_PROVIDER_SITE_OTHER): Payer: Medicare Other | Admitting: Nurse Practitioner

## 2022-05-22 VITALS — BP 122/80 | HR 83 | Temp 98.8°F | Resp 18 | Ht 61.0 in | Wt 143.2 lb

## 2022-05-22 DIAGNOSIS — R82998 Other abnormal findings in urine: Secondary | ICD-10-CM | POA: Diagnosis not present

## 2022-05-22 DIAGNOSIS — R410 Disorientation, unspecified: Secondary | ICD-10-CM | POA: Diagnosis not present

## 2022-05-22 DIAGNOSIS — N390 Urinary tract infection, site not specified: Secondary | ICD-10-CM | POA: Diagnosis not present

## 2022-05-22 LAB — POCT URINALYSIS DIPSTICK
Bilirubin, UA: NEGATIVE
Glucose, UA: NEGATIVE
Ketones, UA: NEGATIVE
Nitrite, UA: NEGATIVE
Protein, UA: POSITIVE — AB
Spec Grav, UA: 1.02 (ref 1.010–1.025)
Urobilinogen, UA: 0.2 E.U./dL
pH, UA: 6 (ref 5.0–8.0)

## 2022-05-22 NOTE — Telephone Encounter (Signed)
Pt and husband came to office just now for advice. I spoke to them. Pt's husband said she woke up confused, didn't know what to do next. Pt said she dressed herself but she just can't remember anything and its worse than normal. The pt said that 2 weeks ago she had a UTI and was treated with an antibiotic. I advised they follow-up with either PCP or urgent care first and be evaluated for either lingering UTI or possible other infection before stopping the Namenda just yet. They were very appreciative and opted to go over to urgent care now as they weren't sure how fast they could get into primary care. She will be due for another dose of Namenda tonight (taking nightly for now, titration schedule). They will update [place] after visit to urgent care. Provided directions to get there.

## 2022-05-22 NOTE — Telephone Encounter (Signed)
Spoke to patient gave test results pt was very excited and states great news Pt asked if we can mail test results to her home Informed patient we can. Informed patient will call her with remaining lab result Pt expressed understanding and thanked me for calling . Will forward request to [employer] .[place]

## 2022-05-22 NOTE — Telephone Encounter (Signed)
I called pt and LMVM that returning call.  After I got off phone, other staff had relayed they were speaking to pt out in the lobby.  Pt to see if needs further treatment for UTI that she has (treated x 1 already) per urgent care or PCP.

## 2022-05-22 NOTE — Progress Notes (Signed)
Careteam: Patient Care Team: Wardell Honour, MD as PCP - General (Family Medicine) Rozetta Nunnery, MD (Inactive) as Consulting Physician (Otolaryngology) Luberta Mutter, MD as Consulting Physician (Ophthalmology)  PLACE OF SERVICE:  [facility]  Advanced Directive information    Allergies  Allergen Reactions   Banana Swelling    Swelling around mouth and eyes and red blotches   Latex Swelling, Rash and Other (See Comments)   Ciprofloxacin Hcl Other (See Comments)    Severe knee inflammation    Gatifloxacin Other (See Comments)    Severe knee inflammation    Levofloxacin Other (See Comments)    Severe knee inflammation    Moxifloxacin Other (See Comments)    Severe knee inflammation    Norfloxacin Other (See Comments)    Severe knee inflammation    Nsaids Other (See Comments)    Upsets ulcers   Ofloxacin Other (See Comments)    Severe knee inflammation   Other Other (See Comments)    FLOXIN   Penicillins Hives    Tolerates (KEFLEX-cephalexin)    Methylisothiazolinone Rash and Other (See Comments)    A preservative found in a cream pt used   Tape Rash    Chief Complaint  Patient presents with   Acute Visit    Possible UTI      HPI: Patient is a 81 y.o. female for follow up on UTI Reports she just got over a UTI, per epic it was in October when she was here last and gave urine for UTI.  States neurologist said her urine was not normal and to drink a lot of water.  Reports she is not thinking well.  Woke up in the middle of the night and felt weird.  She has been following with neurology due to MCI, she was started on namenda for a few days ago and at first questioned if it was due to starting the medication.  No dysuria, no increase in frequency or urgency.   Reports she is still cooking and cleaning.    Review of Systems:  Review of Systems  Constitutional:  Positive for malaise/fatigue. Negative for chills, fever and weight loss.   HENT:  Negative for tinnitus.   Respiratory:  Negative for cough, sputum production and shortness of breath.   Cardiovascular:  Negative for chest pain, palpitations and leg swelling.  Gastrointestinal:  Negative for abdominal pain, constipation, diarrhea and heartburn.  Genitourinary:  Negative for dysuria, frequency and urgency.  Musculoskeletal:  Negative for back pain, falls, joint pain and myalgias.  Skin: Negative.   Neurological:  Negative for dizziness and headaches.  Psychiatric/Behavioral:  Positive for memory loss. Negative for depression. The patient does not have insomnia.     Past Medical History:  Diagnosis Date   Barrett's esophagus    Colitis    DDD (degenerative disc disease)    Factor 5 Leiden mutation, heterozygous [facility])    Per [place] [facility] Patient Packet.   Frequent UTI    H/O echocardiogram 04/01/2019   Per [place] Patient Packet.   Hiatal hernia    Per [place] New Patient Packet.   High grade dysplasia of Barrett's epithelium    Per [facility] New Patient Packet.   History of bladder infections    Per [facility] New Patient Packet.   History of Papanicolaou smear of cervix 10/16/2011   Per [place] Patient Packet   Hypoglycemia    Hypotension    Per [facility] New Patient Packet.   IBS (irritable  bowel syndrome)    Per [facility] New Patient Packet.   Lactose intolerance    Osteoarthritis    Per [place] [facility] Patient Packet.   Osteopenia    Per [place] [facility] Patient Packet.   Osteoporosis    Premature ventricular complex    Raynaud's disease    Per [place] Patient Packet.   Scoliosis    Per [facility] Patient Packet.   Past Surgical History:  Procedure Laterality Date   ABDOMINAL EXPOSURE N/A 08/12/2020   Procedure: ABDOMINAL EXPOSURE;  Surgeon: Serafina Mitchell, MD;  Location: [facility] OR;  Service: Vascular;  Laterality: N/A;   ABDOMINAL HYSTERECTOMY  06/13/2011   By Dr[facility]Scherer at [facility]. Per [facility] New Patient Packet.   ANTERIOR LAT LUMBAR FUSION Right 08/12/2020   Procedure: Right Lumbar  two-three, Lumbar three-four, Lumbar four-five Anterolateral lumbar interbody fusion;  Surgeon: Erline Levine, MD;  Location: [facility];  Service: Neurosurgery;  Laterality: Right;   ANTERIOR LUMBAR FUSION N/A 08/12/2020   Procedure: Lumbar five Sacral one Anterior lumbar interbody fusion;  Surgeon: Erline Levine, MD;  Location: [place];  Service: Neurosurgery;  Laterality: N/A;   BREAST EXCISIONAL BIOPSY Left 30+ yrs ago   COLONOSCOPY  11/23/2011   By Dr.McCune at [facility]. Per [place] Patient Packet.   COLONOSCOPY  08/26/2019   LUMBAR PERCUTANEOUS PEDICLE SCREW 4 LEVEL N/A 08/12/2020   Procedure: Percutaneous pedicle screw fixation from Lumbar two to Sacral one ;  Surgeon: Erline Levine, MD;  Location: [place];  Service: Neurosurgery;  Laterality: N/A;   MAMMOGRAM  04/14/2019   pap smear  10/16/2011   SIGMOIDOSCOPY  11/02/2014   Per [place] New Patient Packet   TONSILLECTOMY  06/12/1948   Per [facility] New Patient Packet.   Social History:   reports that she has quit smoking. Her smoking use included cigarettes. She started smoking about 44 years ago. She has never used smokeless tobacco. She reports current alcohol use of about 7.0 standard drinks of alcohol per week. She reports that she does not use drugs.  Family History  Problem Relation Age of Onset   Cancer Mother    Dementia Mother    Alzheimer's disease Mother    Cancer Father    Alzheimer's disease Maternal Grandmother    Dementia Maternal Grandmother    Alzheimer's disease Maternal Grandfather     Medications: Patient's Medications  New Prescriptions   No medications on file  Previous Medications   ASPIRIN EC 81 MG TABLET    Take 81 mg by mouth 3 (three) times daily.   ATORVASTATIN (LIPITOR) 10 MG TABLET    Take 1 tablet (10 mg total) by mouth daily.   AZO-CRANBERRY PO    Take 1 capsule by mouth as needed (UTI Symptoms).   BISMUTH SUBSALICYLATE (PEPTO-BISMOL PO)    Take by mouth as needed.   BUPROPION (WELLBUTRIN XL)  150 MG 24 HR TABLET    Take 1 tablet (150 mg total) by mouth daily.   CALCIUM PO    Take 1,200 mg by mouth daily.    CHOLECALCIFEROL (VITAMIN D3) 50 MCG (2000 UT) TABS    Take 2,000 Units by mouth in the morning, at noon, and at bedtime.   CONJUGATED ESTROGENS (PREMARIN) VAGINAL CREAM    Place 1 applicator vaginally once a week.   DENOSUMAB (PROLIA) 60 MG/ML SOSY INJECTION    Inject 60 mg into the skin every 6 (six) months.   DICLOFENAC SODIUM (MOTRIN ARTHRITIS PAIN EX)  Apply topically.   DONEPEZIL HCL (ADLARITY) 5 MG/DAY PTWK    Place 5 mg onto the skin once a week.   GLUCOSAMINE HCL 1500 MG TABS    Take 1,500 mg by mouth daily.   HOMEOPATHIC PRODUCTS (SIMILASAN DRY EYE RELIEF OP)    Apply to eye.   MEMANTINE (NAMENDA) 10 MG TABLET    Start with one pill at bedtime and in 1 -2 weeks if no side effects increase to twice daily   MESALAMINE (LIALDA) 1.2 G EC TABLET    Take 1.2 g by mouth daily.   METHENAMINE (HIPREX) 1 G TABLET    Take 1 g by mouth 2 (two) times daily with a meal. For UTI Prevention. Last UTI: October 20th, November 5th, and December 28th.   MULTIPLE VITAMIN (MULTIVITAMIN WITH MINERALS) TABS TABLET    Take 1 tablet by mouth daily. One A Day for Women   OMEPRAZOLE-SODIUM BICARBONATE (ZEGERID) 20-1100 MG CAPS CAPSULE    Take 1 capsule by mouth 2 (two) times daily.   OVER THE COUNTER MEDICATION    Place 1 drop into both ears as needed. Grapefruit Seed Extract Oil   POLYETHYL GLYCOL-PROPYL GLYCOL (SYSTANE) 0.4-0.3 % SOLN    Place 1 drop into both eyes as needed (dry/irritated eyes).   PROBIOTIC PRODUCT (PROBIOTIC COLON SUPPORT PO)    Take 1 tablet by mouth in the morning and at bedtime.   PSYLLIUM (METAMUCIL) 58.6 % POWDER    Take 1 packet by mouth 3 (three) times daily.   VALACYCLOVIR (VALTREX) 500 MG TABLET    Take 1 tablet (500 mg total) by mouth daily as needed.  Modified Medications   No medications on file  Discontinued Medications   FLUTICASONE (FLONASE) 50 MCG/ACT NASAL  SPRAY    Place 1 spray into both nostrils as needed for allergies or rhinitis.   LOPERAMIDE-SIMETHICONE 2-125 MG TABS    Take 1 tablet by mouth daily.    Physical Exam:  Vitals:   05/22/22 1154  BP: 122/80  Pulse: 83  Resp: 18  Temp: 98.8 F (37.1 C)  SpO2: 98%  Weight: 143 lb 4 oz (65 kg)  Height: '5\' 1"'$  (1.549 m)   Body mass index is 27.07 kg/m. Wt Readings from Last 3 Encounters:  05/22/22 143 lb 4 oz (65 kg)  05/15/22 143 lb 3.2 oz (65 kg)  04/26/22 144 lb (65.3 kg)    Physical Exam Constitutional:      General: She is not in acute distress.    Appearance: She is well-developed. She is not diaphoretic.  HENT:     Head: Normocephalic and atraumatic.     Mouth/Throat:     Pharynx: No oropharyngeal exudate.  Eyes:     Conjunctiva/sclera: Conjunctivae normal.     Pupils: Pupils are equal, round, and reactive to light.  Cardiovascular:     Rate and Rhythm: Normal rate and regular rhythm.     Heart sounds: Normal heart sounds.  Pulmonary:     Effort: Pulmonary effort is normal.     Breath sounds: Normal breath sounds.  Abdominal:     General: Bowel sounds are normal.     Palpations: Abdomen is soft.  Musculoskeletal:     Cervical back: Normal range of motion and neck supple.     Right lower leg: No edema.     Left lower leg: No edema.  Skin:    General: Skin is warm and dry.  Neurological:     Mental Status:  She is alert.  Psychiatric:        Mood and Affect: Mood normal.     Labs reviewed: Basic Metabolic Panel: Recent Labs    07/07/21 1012 01/04/22 1500 03/28/22 1502 05/15/22 1231  NA 141 139 143 141  K 4.4 5.2 4.5 5.2  CL 101 100 105 102  CO2 '25 29 29 25  '$ GLUCOSE 101* 90 92 90  BUN '11 12 14 14  '$ CREATININE 0.80 0.97 0.94 1.09*  CALCIUM 9.2 10.2 9.4 10.4*  TSH 2.490  --   --  3.220   Liver Function Tests: Recent Labs    01/04/22 1500 03/28/22 1502 05/15/22 1231  AST '24 23 31  '$ ALT '14 19 24  '$ ALKPHOS  --   --  78  BILITOT 0.4 0.3 0.3   PROT 7.3 6.9 7.3  ALBUMIN  --   --  4.8   No results for input(s): "LIPASE", "AMYLASE" in the last 8760 hours. No results for input(s): "AMMONIA" in the last 8760 hours. CBC: Recent Labs    01/04/22 1500 03/28/22 1502 05/15/22 1231  WBC 7.7 4.8 6.1  NEUTROABS 5,305 2,966 4.1  HGB 13.0 12.2 13.3  HCT 39.8 36.5 40.0  MCV 98.3 98.1 98*  PLT 272 247 254   Lipid Panel: Recent Labs    10/18/21 1055  CHOL 234*  HDL 82  LDLCALC 124*  TRIG 163*  CHOLHDL 2.9   TSH: Recent Labs    07/07/21 1012 05/15/22 1231  TSH 2.490 3.220   A1C: Lab Results  Component Value Date   HGBA1C 5.9 (H) 03/28/2022     Assessment/Plan 1. Acute confusion -in the past this has been due to UTI, she was treated last in October, does not have urologist  Urine does reveal blood and leukocytes in urine -encouraged to increase water intake.  - POC Urinalysis Dipstick - Culture, Urine - CBC with Differential/Platelet - BASIC METABOLIC PANEL WITH GFR  2. Leukocytes in urine -will send urine off for culture.   Carlos American. [facility] [phone]

## 2022-05-22 NOTE — Telephone Encounter (Signed)
Pt's husband has called to report he believes pt has had a reaction to the memantine (NAMENDA) 10 MG tablet , Pt has confusion, loss of memory.  Spouse says this is as of this morning.  Please call to discuss

## 2022-05-22 NOTE — Telephone Encounter (Signed)
-----   Message from Melvenia Beam, MD sent at 05/22/2022 12:10 PM EST ----- Pod 4 please call and give her good news: Negative for the APOE4 variant that is associated with increased risk for late onset Alzheimer's disease (AD). NEGATIVE for alzheimer's gene excellent!!! Also check on the ATN profile I have no results for that yet thanks

## 2022-05-23 LAB — BASIC METABOLIC PANEL WITH GFR
BUN: 15 mg/dL (ref 7–25)
CO2: 25 mmol/L (ref 20–32)
Calcium: 9.3 mg/dL (ref 8.6–10.4)
Chloride: 101 mmol/L (ref 98–110)
Creat: 0.97 mg/dL (ref 0.60–1.00)
Glucose, Bld: 181 mg/dL — ABNORMAL HIGH (ref 65–99)
Potassium: 4 mmol/L (ref 3.5–5.3)
Sodium: 138 mmol/L (ref 135–146)
eGFR: 59 mL/min/{1.73_m2} — ABNORMAL LOW (ref >=60)

## 2022-05-23 LAB — CBC WITH DIFFERENTIAL/PLATELET
Absolute Monocytes: 400 cells/uL (ref 200–950)
Basophils Absolute: 58 cells/uL (ref 0–200)
Basophils Relative: 1 %
Eosinophils Absolute: 0 cells/uL — ABNORMAL LOW (ref 15–500)
Eosinophils Relative: 0 %
HCT: 38 % (ref 35.0–45.0)
Hemoglobin: 12.7 g/dL (ref 11.7–15.5)
Lymphs Abs: 986 cells/uL (ref 850–3900)
MCH: 33.5 pg — ABNORMAL HIGH (ref 27.0–33.0)
MCHC: 33.4 g/dL (ref 32.0–36.0)
MCV: 100.3 fL — ABNORMAL HIGH (ref 80.0–100.0)
MPV: 10.6 fL (ref 7.5–12.5)
Monocytes Relative: 6.9 %
Neutro Abs: 4356 cells/uL (ref 1500–7800)
Neutrophils Relative %: 75.1 %
Platelets: 243 10*3/uL (ref 140–400)
RBC: 3.79 10*6/uL — ABNORMAL LOW (ref 3.80–5.10)
RDW: 11.7 % (ref 11.0–15.0)
Total Lymphocyte: 17 %
WBC: 5.8 10*3/uL (ref 3.8–10.8)

## 2022-05-24 ENCOUNTER — Telehealth: Payer: Self-pay | Admitting: Neurology

## 2022-05-24 ENCOUNTER — Telehealth: Payer: Self-pay

## 2022-05-24 NOTE — Telephone Encounter (Signed)
Pt's husband called wanting to know if the RN can call him back with the Urine results to see if the pt has a UTI or not. Please advise.

## 2022-05-24 NOTE — Telephone Encounter (Signed)
Patient called and asked results of Urine Culture. Please Advise. I see that it still says in process.

## 2022-05-24 NOTE — Telephone Encounter (Signed)
Upon review of chart, patient's husband had spoken with [facility] this afternoon and was told the results of the culture is still pending.  I called him and answered his questions.  He will follow-up with them for the results and next steps. He states the patient is drinking water. Currently the patient has taken approximately 8 tablets of Namenda and denies any side effects right now such as diarrhea, nausea, vomiting.  He states the patient is sleeping well.  We reiterated the plan for the patient to increase to Namenda twice daily once she has been on the once daily dosing for 1 to 2 weeks with no side effects.  Also discussed patient is to start donepezil patch after being on Namenda 4 to 6 weeks and is on twice daily dosing.   Next appointment with Dr Jaynee Eagles will be a telephone visit on 07/03/2022 at 1:30 pm. He verbalized appreciation for the call.

## 2022-05-24 NOTE — Telephone Encounter (Signed)
Pt was seen at [facility] on 05/22/22 and had urine tested. I do see the culture is not back yet but either way he would need to follow-up with them for results and next steps.

## 2022-05-26 LAB — URINE CULTURE
MICRO NUMBER:: 14297494
SPECIMEN QUALITY:: ADEQUATE

## 2022-05-26 NOTE — Telephone Encounter (Signed)
Patient was advised and verbalized understanding. 

## 2022-05-26 NOTE — Telephone Encounter (Signed)
No final urine culture results received yet.

## 2022-05-26 NOTE — Telephone Encounter (Signed)
Final urine culture indicates 10,000 - 49,000 colonies of E.Coli usually treat if > 100,000 colonies.

## 2022-05-26 NOTE — Telephone Encounter (Signed)
After advising patient waiting for the final urine culture results quest send results. Please advise.

## 2022-05-26 NOTE — Telephone Encounter (Signed)
Patient called office back to see about urine culture results.

## 2022-05-26 NOTE — Telephone Encounter (Signed)
Patient was advised and verbalized understanding. She stated she will reach out to her neurologist because she was advised that her confusion could be the result of a possible UTI.

## 2022-06-06 ENCOUNTER — Encounter (HOSPITAL_BASED_OUTPATIENT_CLINIC_OR_DEPARTMENT_OTHER): Payer: Self-pay | Admitting: Emergency Medicine

## 2022-06-06 ENCOUNTER — Telehealth: Payer: Self-pay | Admitting: Neurology

## 2022-06-06 ENCOUNTER — Ambulatory Visit: Payer: Medicare Other | Admitting: Family Medicine

## 2022-06-06 ENCOUNTER — Emergency Department (HOSPITAL_BASED_OUTPATIENT_CLINIC_OR_DEPARTMENT_OTHER)
Admission: EM | Admit: 2022-06-06 | Discharge: 2022-06-06 | Disposition: A | Discharge status: Home / Self Care | Payer: Medicare Other | Attending: Emergency Medicine | Admitting: Emergency Medicine

## 2022-06-06 DIAGNOSIS — Z91040 Latex allergy status: Secondary | ICD-10-CM | POA: Insufficient documentation

## 2022-06-06 DIAGNOSIS — Z7982 Long term (current) use of aspirin: Secondary | ICD-10-CM | POA: Insufficient documentation

## 2022-06-06 DIAGNOSIS — R519 Headache, unspecified: Secondary | ICD-10-CM | POA: Diagnosis present

## 2022-06-06 DIAGNOSIS — J329 Chronic sinusitis, unspecified: Secondary | ICD-10-CM | POA: Diagnosis not present

## 2022-06-06 DIAGNOSIS — R040 Epistaxis: Secondary | ICD-10-CM | POA: Insufficient documentation

## 2022-06-06 DIAGNOSIS — R058 Other specified cough: Secondary | ICD-10-CM | POA: Insufficient documentation

## 2022-06-06 DIAGNOSIS — Z1152 Encounter for screening for COVID-19: Secondary | ICD-10-CM | POA: Diagnosis not present

## 2022-06-06 LAB — RESP PANEL BY RT-PCR (RSV, FLU A&B, COVID)  RVPGX2
Influenza A by PCR: NEGATIVE
Influenza B by PCR: NEGATIVE
Resp Syncytial Virus by PCR: NEGATIVE
SARS Coronavirus 2 by RT PCR: NEGATIVE

## 2022-06-06 MED ORDER — DOXYCYCLINE HYCLATE 100 MG PO CAPS: 100.0000 mg | ORAL_CAPSULE | Freq: Two times a day (BID) | ORAL | 0 refills | Status: DC

## 2022-06-06 NOTE — ED Provider Notes (Signed)
[place] EMERGENCY DEPARTMENT Provider Note   CSN: [id number] Arrival date & time: 06/06/22  1421     History  Chief Complaint  Patient presents with   Epistaxis    Carol Perez is a 81 y.o. female with a past medical history as below presenting to the emergency department for evaluation of headache, sinus pain in the last 7 days.  Patient reports she has had pain in her head, neck, forehead, sinuses for the last 7 days.  Patient also reports some postnasal drip and mild cough.  She reports feeling hot and chills at home.  Patient states she has been using nasal spray for her nasal congestion and started to have nosebleeding.  Bleeding is controlled at the moment.  No nausea, vomiting, chest pain, shortness of breath.   Epistaxis     Past Medical History:  Diagnosis Date   Barrett's esophagus    Colitis    DDD (degenerative disc disease)    Factor 5 Leiden mutation, heterozygous [facility])    Per [place] New Patient Packet.   Frequent UTI    H/O echocardiogram 04/01/2019   Per [place] Patient Packet.   Hiatal hernia    Per [place] New Patient Packet.   High grade dysplasia of Barrett's epithelium    Per [facility] New Patient Packet.   History of bladder infections    Per [facility] New Patient Packet.   History of Papanicolaou smear of cervix 10/16/2011   Per [place] Patient Packet   Hypoglycemia    Hypotension    Per [facility] New Patient Packet.   IBS (irritable bowel syndrome)    Per [place] New Patient Packet.   Lactose intolerance    Osteoarthritis    Per [place] New Patient Packet.   Osteopenia    Per [place] New Patient Packet.   Osteoporosis    Premature ventricular complex    Raynaud's disease    Per [place] Patient Packet.   Scoliosis    Per [facility] New Patient Packet.   Past Surgical History:  Procedure Laterality Date   ABDOMINAL EXPOSURE N/A 08/12/2020   Procedure: ABDOMINAL EXPOSURE;  Surgeon: Serafina Mitchell, MD;  Location: [facility] OR;  Service: Vascular;  Laterality: N/A;    ABDOMINAL HYSTERECTOMY  06/13/2011   By Dr.Scherer at [facility]. Per [facility] New Patient Packet.   ANTERIOR LAT LUMBAR FUSION Right 08/12/2020   Procedure: Right Lumbar two-three, Lumbar three-four, Lumbar four-five Anterolateral lumbar interbody fusion;  Surgeon: Erline Levine, MD;  Location: [place];  Service: Neurosurgery;  Laterality: Right;   ANTERIOR LUMBAR FUSION N/A 08/12/2020   Procedure: Lumbar five Sacral one Anterior lumbar interbody fusion;  Surgeon: Erline Levine, MD;  Location: [facility];  Service: Neurosurgery;  Laterality: N/A;   BREAST EXCISIONAL BIOPSY Left 30+ yrs ago   COLONOSCOPY  11/23/2011   By Dr.McCune at [facility]. Per [place] Patient Packet.   COLONOSCOPY  08/26/2019   LUMBAR PERCUTANEOUS PEDICLE SCREW 4 LEVEL N/A 08/12/2020   Procedure: Percutaneous pedicle screw fixation from Lumbar two to Sacral one ;  Surgeon: Erline Levine, MD;  Location: [facility];  Service: Neurosurgery;  Laterality: N/A;   MAMMOGRAM  04/14/2019   pap smear  10/16/2011   SIGMOIDOSCOPY  11/02/2014   Per [place] New Patient Packet   TONSILLECTOMY  [birth year]/1948   Per [facility] New Patient Packet.     Home Medications Prior to Admission medications   Medication Sig Start Date End Date Taking? Authorizing Provider  doxycycline (VIBRAMYCIN) 100 MG capsule Take 1 capsule (100 mg total) by mouth 2 (two) times daily. 06/06/22  Yes Rex Kras, PA  aspirin EC 81 MG tablet Take 81 mg by mouth 3 (three) times daily.    [provider]  atorvastatin (LIPITOR) 10 MG tablet Take 1 tablet (10 mg total) by mouth daily. 04/10/22   Wardell Honour, MD  AZO-CRANBERRY PO Take 1 capsule by mouth as needed (UTI Symptoms).    [provider]  Bismuth Subsalicylate (PEPTO-BISMOL PO) Take by mouth as needed.    [provider]  buPROPion (WELLBUTRIN XL) 150 MG 24 hr tablet Take 1 tablet (150 mg total) by mouth daily. 03/06/22   Ngetich, Dinah C, NP  CALCIUM PO Take 1,200 mg by mouth daily.      [provider]  Cholecalciferol (VITAMIN D3) 50 MCG (2000 UT) TABS Take 2,000 Units by mouth in the morning, at noon, and at bedtime.    [provider]  conjugated estrogens (PREMARIN) vaginal cream Place 1 applicator vaginally once a week.    [provider]  denosumab (PROLIA) 60 MG/ML SOSY injection Inject 60 mg into the skin every 6 (six) months.    [provider]  Diclofenac Sodium (MOTRIN ARTHRITIS PAIN EX) Apply topically.    [provider]  Donepezil HCl (ADLARITY) 5 MG/DAY PTWK Place 5 mg onto the skin once a week. 05/15/22   Melvenia Beam, MD  Glucosamine HCl 1500 MG TABS Take 1,500 mg by mouth daily.    [provider]  Homeopathic Products ([place] OP) Apply to eye.    [provider]  memantine (NAMENDA) 10 MG tablet Start with one pill at bedtime and in 1 -2 weeks if no side effects increase to twice daily 05/15/22   Melvenia Beam, MD  mesalamine (LIALDA) 1.2 g EC tablet Take 1.2 g by mouth daily.    [provider]  methenamine (HIPREX) 1 g tablet Take 1 g by mouth 2 (two) times daily with a meal. For UTI Prevention. Last UTI: October 20th, November 5th, and December 28th.    [provider]  Multiple Vitamin (MULTIVITAMIN WITH MINERALS) TABS tablet Take 1 tablet by mouth daily. One A Day for Women    [provider]  Omeprazole-Sodium Bicarbonate (ZEGERID) 20-1100 MG CAPS capsule Take 1 capsule by mouth 2 (two) times daily.    [provider]  OVER THE COUNTER MEDICATION Place 1 drop into both ears as needed. Grapefruit Seed Extract Oil    [provider]  Polyethyl Glycol-Propyl Glycol (SYSTANE) 0.4-0.3 % SOLN Place 1 drop into both eyes as needed (dry/irritated eyes).    [provider]  Probiotic Product (PROBIOTIC COLON SUPPORT PO) Take 1 tablet by mouth in the morning and at bedtime.    [provider]  psyllium (METAMUCIL) 58.6 %  powder Take 1 packet by mouth 3 (three) times daily.    [provider]  valACYclovir (VALTREX) 500 MG tablet Take 1 tablet (500 mg total) by mouth daily as needed. 03/30/22   Wardell Honour, MD      Allergies    Banana, Latex, Ciprofloxacin hcl, Gatifloxacin, Levofloxacin, Moxifloxacin, Norfloxacin, Nsaids, Ofloxacin, Other, Penicillins, Methylisothiazolinone, and Tape    Review of Systems   Review of Systems  HENT:  Positive for nosebleeds.     Physical Exam Updated Vital Signs BP (!) 157/77 (BP Location: Left Arm)   Pulse 78  Temp 98.3 F (36.8 C) (Oral)   Resp 18   Ht '5\' 1"'$  (1.549 m)   Wt 65.8 kg   SpO2 100%   BMI 27.40 kg/m  Physical Exam Vitals and nursing note reviewed.  Constitutional:      Appearance: Normal appearance.  HENT:     Head: Normocephalic and atraumatic.     Mouth/Throat:     Mouth: Mucous membranes are moist.  Eyes:     General: No scleral icterus. Cardiovascular:     Rate and Rhythm: Normal rate and regular rhythm.     Pulses: Normal pulses.     Heart sounds: Normal heart sounds.  Pulmonary:     Effort: Pulmonary effort is normal.     Breath sounds: Normal breath sounds.  Abdominal:     General: Abdomen is flat.     Palpations: Abdomen is soft.     Tenderness: There is no abdominal tenderness.  Musculoskeletal:        General: No deformity.  Skin:    General: Skin is warm.     Findings: No rash.  Neurological:     General: No focal deficit present.     Mental Status: She is alert.  Psychiatric:        Mood and Affect: Mood normal.     ED Results / Procedures / Treatments   Labs (all labs ordered are listed, but only abnormal results are displayed) Labs Reviewed  RESP PANEL BY RT-PCR (RSV, FLU A&B, COVID)  [id number]    EKG None  Radiology No results found.  Procedures Procedures    Medications Ordered in ED Medications - No data to display  ED Course/ Medical Decision Making/ A&P                            Medical Decision Making Risk Prescription drug management.   This patient presents to the ED for nose bleeding, headache, nasal congestion, this involves an extensive number of treatment options, and is a complaint that carries with a high risk of complications and morbidity.  The differential diagnosis includes sinusitis, COVID, flu, RSV, pharyngitis, bronchitis, pneumonia, infectious etiology..  This is not an exhaustive list.  Comorbidities that complicate the patient evaluation See HPI  Social determinants of health NA  Additional history obtained: External records from outside source obtained and reviewed including: Chart review including previous notes, labs, imaging.  Cardiac monitoring/EKG: The patient was maintained on a cardiac monitor.  I personally reviewed and interpreted the cardiac monitor which showed an underlying rhythm of: Sinus rhythm.  Lab tests: Viral panel negative.  Imaging studies:    Problem list/ ED course/ Critical interventions/ Medical management: HPI: See above Vital signs within normal range and stable throughout visit. Laboratory/imaging studies significant for: See above. On physical examination, patient is afebrile and appears in no acute distress.  This patient presents with pain in her frontal and ethmoid sinuses with postnasal drip and cough.  Viral panel negative. Based on patient's clinical presentations and laboratory/imaging studies I suspect sinusitis.  I sent an Rx of doxycycline as patient is allergic to penicillin.  Advised patient to follow-up with primary care physician for further evaluation and management.  Return to ER if new or worsening symptoms. I have reviewed the patient home medicines and have made adjustments as needed.  Consultations obtained:  Disposition Continued outpatient therapy. Follow-up with PCP recommended for reevaluation of symptoms. Treatment plan discussed with patient.  Pt  acknowledged understanding was  agreeable to the plan. Worrisome signs and symptoms were discussed with patient, and patient acknowledged understanding to return to the ED if they noticed these signs and symptoms. Patient was stable upon discharge.   This chart was dictated using voice recognition software.  Despite best efforts to proofread,  errors can occur which can change the documentation meaning.          Final Clinical Impression(s) / ED Diagnoses Final diagnoses:  Sinusitis, unspecified chronicity, unspecified location    Rx / DC Orders ED Discharge Orders          Ordered    doxycycline (VIBRAMYCIN) 100 MG capsule  2 times daily        06/06/22 1828              Rex Kras, PA 06/06/22 2155    Sherwood Gambler, MD 06/08/22 [phone]

## 2022-06-06 NOTE — Discharge Instructions (Addendum)
Please take your medications as prescribed. Take tylenol/ibuprofen for pain. I recommend close follow-up with PCP for reevaluation.  Please do not hesitate to return to emergency department if worrisome signs symptoms we discussed become apparent.  

## 2022-06-06 NOTE — ED Triage Notes (Signed)
Patient presents C/O sinus infection X1 week. Has been taking OTC meds with no relief. Reports she has had nose bleed X2 today. No active bleeding

## 2022-06-06 NOTE — Telephone Encounter (Signed)
After sending our office notes, [facility] is requiring a peer to peer be done for the pet scan ordered for this patient before 06/11/22. Please call [phone] option 3. Reference number [id number].  The request cannot be approved because: "1.You must have been diagnosed with dementia. 2.You must meet the criteria for Alzheimer's disease. 3.You must meet the criteria for Frontotemporal Dementia. 4.It must be needed to differentiate between a diagnosis of frontotemporal dementia and Alzheimer's disease in order to help guide future treatment. Both conditions are long term diseases that may cause memory loss."

## 2022-06-14 NOTE — Telephone Encounter (Signed)
Placed on Dr Cathren Laine desk for review and determine next course of action.

## 2022-06-14 NOTE — Telephone Encounter (Signed)
I received a denial letter with the option for an appeal I will place in pod 4.

## 2022-06-15 ENCOUNTER — Ambulatory Visit
Admission: RE | Admit: 2022-06-15 | Discharge: 2022-06-15 | Disposition: A | Discharge status: Home / Self Care | Payer: Medicare Other | Source: Ambulatory Visit | Attending: Family Medicine | Admitting: Family Medicine

## 2022-06-15 DIAGNOSIS — Z1231 Encounter for screening mammogram for malignant neoplasm of breast: Secondary | ICD-10-CM | POA: Diagnosis not present

## 2022-06-16 ENCOUNTER — Other Ambulatory Visit: Payer: Self-pay | Admitting: Family Medicine

## 2022-06-16 DIAGNOSIS — R928 Other abnormal and inconclusive findings on diagnostic imaging of breast: Secondary | ICD-10-CM

## 2022-06-19 NOTE — Telephone Encounter (Signed)
We have a telephone visit in the 22nd of this month we can discuss at that time thanks

## 2022-06-22 ENCOUNTER — Ambulatory Visit: Payer: Medicare Other

## 2022-06-22 ENCOUNTER — Encounter: Payer: Self-pay | Admitting: Adult Health

## 2022-06-22 ENCOUNTER — Ambulatory Visit (INDEPENDENT_AMBULATORY_CARE_PROVIDER_SITE_OTHER): Payer: Medicare Other | Admitting: Adult Health

## 2022-06-22 VITALS — BP 122/80 | HR 63 | Temp 96.2°F | Ht 61.0 in | Wt 144.0 lb

## 2022-06-22 DIAGNOSIS — R42 Dizziness and giddiness: Secondary | ICD-10-CM

## 2022-06-22 DIAGNOSIS — G3184 Mild cognitive impairment, so stated: Secondary | ICD-10-CM | POA: Diagnosis not present

## 2022-06-22 LAB — POCT URINALYSIS DIPSTICK
Bilirubin, UA: NEGATIVE
Blood, UA: NEGATIVE
Glucose, UA: NEGATIVE
Ketones, UA: NEGATIVE
Leukocytes, UA: NEGATIVE
Nitrite, UA: NEGATIVE
Protein, UA: NEGATIVE
Spec Grav, UA: 1.01 (ref 1.010–1.025)
Urobilinogen, UA: 0.2 E.U./dL
pH, UA: 7 (ref 5.0–8.0)

## 2022-06-22 MED ORDER — MECLIZINE HCL 12.5 MG PO TABS: 12.5000 mg | ORAL_TABLET | Freq: Three times a day (TID) | ORAL | 0 refills | Status: AC | PRN

## 2022-06-22 NOTE — Patient Instructions (Signed)
Dizziness Dizziness is a common problem. It makes you feel unsteady or light-headed. You may feel like you are about to pass out (faint). Dizziness can lead to getting hurt if you stumble or fall. Dizziness can be caused by many things, including: Medicines. Not having enough water in your body (dehydration). Illness. Follow these instructions at home: Eating and drinking  Drink enough fluid to keep your pee (urine) pale yellow. This helps to keep you from getting dehydrated. Try to drink more clear fluids, such as water. Do not drink alcohol. Limit how much caffeine you drink or eat, if your doctor tells you to do that. Limit how much salt (sodium) you drink or eat, if your doctor tells you to do that. Activity  Avoid making quick movements. Stand up slowly from sitting in a chair, and steady yourself until you feel okay. In the morning, first sit up on the side of the bed. When you feel okay, stand up slowly while you hold onto something. Do this until you know that your balance is okay. If you need to stand in one place for a long time, move your legs often. Tighten and relax the muscles in your legs while you are standing. Do not drive or use machinery if you feel dizzy. Avoid bending down if you feel dizzy. Place items in your home so you can reach them easily without leaning over. Lifestyle Do not smoke or use any products that contain nicotine or tobacco. If you need help quitting, ask your doctor. Try to lower your stress level. You can do this by using methods such as yoga or meditation. Talk with your doctor if you need help. General instructions Watch your dizziness for any changes. Take over-the-counter and prescription medicines only as told by your doctor. Talk with your doctor if you think that you are dizzy because of a medicine that you are taking. Tell a friend or a family member that you are feeling dizzy. If he or she notices any changes in your behavior, have this  person call your doctor. Keep all follow-up visits. Contact a doctor if: Your dizziness does not go away. Your dizziness or light-headedness gets worse. You feel like you may vomit (are nauseous). You have trouble hearing. You have new symptoms. You are unsteady on your feet. You feel like the room is spinning. You have neck pain or a stiff neck. You have a fever. Get help right away if: You vomit or have watery poop (diarrhea), and you cannot eat or drink anything. You have trouble: Talking. Walking. Swallowing. Using your arms, hands, or legs. You feel generally weak. You are not thinking clearly, or you have trouble forming sentences. A friend or family member may notice this. You have: Chest pain. Pain in your belly (abdomen). Shortness of breath. Sweating. Your vision changes. You are bleeding. You have a very bad headache. These symptoms may be an emergency. Get help right away. Call your local emergency services (911 in the [place] Do not wait to see if the symptoms will go away. Do not drive yourself to the hospital. Summary Dizziness makes you feel unsteady or light-headed. You may feel like you are about to pass out (faint). Drink enough fluid to keep your pee (urine) pale yellow. Do not drink alcohol. Avoid making quick movements if you feel dizzy. Watch your dizziness for any changes. This information is not intended to replace advice given to you by your health care provider. Make sure you discuss any questions   you have with your health care provider. Document Revised: 05/03/2020 Document Reviewed: 05/03/2020 Elsevier Patient Education  2023 [employer]  

## 2022-06-22 NOTE — Progress Notes (Signed)
-------------------------------------  --  [facility] clinic  Provider: Durenda Age DNP  Code Status:  Full Code  Goals of Care:     06/06/2022    2:45 PM  Advanced Directives  Does Patient Have a Medical Advance Directive? No     Chief Complaint  Patient presents with   Acute Visit    Patient presents today for dizziness, sinus drainage, cough w/ mucus for about 1 month now. She thinks that she may have an ear infection.    HPI: Patient is a 82 y.o. female seen today for an acute visit for dizziness. She recently went to ED on 06/06/22 for sinusitis and was started on Doxycycline X 10 days. She completed antibiotics. She recently started taking Memantine and recently increased dosage from 10 mg to 20 mg at bedtime. According to her husband, who was with her today, it was during that time that she started getting dizzy. She usually drinks an oz of alcohol every night. Patient stated that she drinks 4 cups of water a day. Urine specimen was collected today and patient noted that it "not clear". Urine dipstick was negative. She stated that she has been put OTC (calm ear) to her right ear for itching. No noted erythema on bilateral ears.   Past Medical History:  Diagnosis Date   Barrett's esophagus    Colitis    DDD (degenerative disc disease)    Factor 5 Leiden mutation, heterozygous [facility])    Per [place] New Patient Packet.   Frequent UTI    H/O echocardiogram 04/01/2019   Per [place] Patient Packet.   Hiatal hernia    Per [place] New Patient Packet.   High grade dysplasia of Barrett's epithelium    Per [facility] New Patient Packet.   History of bladder infections    Per [facility] New Patient Packet.   History of Papanicolaou smear of cervix 10/16/2011   Per [place] Patient Packet   Hypoglycemia    Hypotension    Per [facility] New Patient Packet.   IBS (irritable bowel syndrome)    Per [place] New Patient Packet.   Lactose intolerance    Osteoarthritis    Per [place] New Patient Packet.    Osteopenia    Per [place] New Patient Packet.   Osteoporosis    Premature ventricular complex    Raynaud's disease    Per [place] Patient Packet.   Scoliosis    Per [facility] New Patient Packet.    Past Surgical History:  Procedure Laterality Date   ABDOMINAL EXPOSURE N/A 08/12/2020   Procedure: ABDOMINAL EXPOSURE;  Surgeon: Serafina Mitchell, MD;  Location: [facility] OR;  Service: Vascular;  Laterality: N/A;   ABDOMINAL HYSTERECTOMY  06/13/2011   By Dr.Scherer at [facility]. Per [facility] New Patient Packet.   ANTERIOR LAT LUMBAR FUSION Right 08/12/2020   Procedure: Right Lumbar two-three, Lumbar three-four, Lumbar four-five Anterolateral lumbar interbody fusion;  Surgeon: Erline Levine, MD;  Location: [place];  Service: Neurosurgery;  Laterality: Right;   ANTERIOR LUMBAR FUSION N/A 08/12/2020   Procedure: Lumbar five Sacral one Anterior lumbar interbody fusion;  Surgeon: Erline Levine, MD;  Location: [place];  Service: Neurosurgery;  Laterality: N/A;   BREAST EXCISIONAL BIOPSY Left 30+ yrs ago   COLONOSCOPY  11/23/2011   By Dr.McCune at [facility]. Per [place] Patient Packet.   COLONOSCOPY  08/26/2019   LUMBAR PERCUTANEOUS PEDICLE SCREW 4 LEVEL N/A 08/12/2020   Procedure: Percutaneous pedicle screw fixation from Lumbar two to Sacral  one ;  Surgeon: Erline Levine, MD;  Location: [place];  Service: Neurosurgery;  Laterality: N/A;   MAMMOGRAM  04/14/2019   pap smear  10/16/2011   SIGMOIDOSCOPY  11/02/2014   Per [place] New Patient Packet   TONSILLECTOMY  06/12/1948   Per [facility] New Patient Packet.    Allergies  Allergen Reactions   Banana Swelling    Swelling around mouth and eyes and red blotches   Latex Swelling, Rash and Other (See Comments)   Ciprofloxacin Hcl Other (See Comments)    Severe knee inflammation    Gatifloxacin Other (See Comments)    Severe knee inflammation    Levofloxacin Other (See Comments)    Severe knee inflammation    Moxifloxacin Other (See Comments)    Severe knee  inflammation    Norfloxacin Other (See Comments)    Severe knee inflammation    Nsaids Other (See Comments)    Upsets ulcers   Ofloxacin Other (See Comments)    Severe knee inflammation   Other Other (See Comments)    FLOXIN   Penicillins Hives    Tolerates (KEFLEX-cephalexin)    Methylisothiazolinone Rash and Other (See Comments)    A preservative found in a cream pt used   Tape Rash    Outpatient Encounter Medications as of 06/22/2022  Medication Sig   aspirin EC 81 MG tablet Take 81 mg by mouth 3 (three) times daily.   atorvastatin (LIPITOR) 10 MG tablet Take 1 tablet (10 mg total) by mouth daily.   AZO-CRANBERRY PO Take 1 capsule by mouth as needed (UTI Symptoms).   Bismuth Subsalicylate (PEPTO-BISMOL PO) Take by mouth as needed.   buPROPion (WELLBUTRIN XL) 150 MG 24 hr tablet Take 1 tablet (150 mg total) by mouth daily.   CALCIUM PO Take 1,200 mg by mouth daily.    Cholecalciferol (VITAMIN D3) 50 MCG (2000 UT) TABS Take 2,000 Units by mouth in the morning, at noon, and at bedtime.   conjugated estrogens (PREMARIN) vaginal cream Place 1 applicator vaginally once a week.   denosumab (PROLIA) 60 MG/ML SOSY injection Inject 60 mg into the skin every 6 (six) months.   Diclofenac Sodium (MOTRIN ARTHRITIS PAIN EX) Apply topically.   Donepezil HCl (ADLARITY) 5 MG/DAY PTWK Place 5 mg onto the skin once a week.   Glucosamine HCl 1500 MG TABS Take 1,500 mg by mouth daily.   Homeopathic Products (SIMILASAN DRY EYE RELIEF OP) Apply to eye.   meclizine (ANTIVERT) 12.5 MG tablet Take 1 tablet (12.5 mg total) by mouth 3 (three) times daily as needed for up to 14 days for dizziness.   memantine (NAMENDA) 10 MG tablet Start with one pill at bedtime and in 1 -2 weeks if no side effects increase to twice daily   mesalamine (LIALDA) 1.2 g EC tablet Take 1.2 g by mouth daily.   methenamine (HIPREX) 1 g tablet Take 1 g by mouth 2 (two) times daily with a meal. For UTI Prevention. Last UTI: October  20th, November 5th, and December 28th.   Multiple Vitamin (MULTIVITAMIN WITH MINERALS) TABS tablet Take 1 tablet by mouth daily. One A Day for Women   Omeprazole-Sodium Bicarbonate (ZEGERID) 20-1100 MG CAPS capsule Take 1 capsule by mouth 2 (two) times daily.   OVER THE COUNTER MEDICATION Place 1 drop into both ears as needed. Grapefruit Seed Extract Oil   Polyethyl Glycol-Propyl Glycol (SYSTANE) 0.4-0.3 % SOLN Place 1 drop into both eyes as needed (dry/irritated eyes).   Probiotic Product (  PROBIOTIC COLON SUPPORT PO) Take 1 tablet by mouth in the morning and at bedtime.   psyllium (METAMUCIL) 58.6 % powder Take 1 packet by mouth 3 (three) times daily.   valACYclovir (VALTREX) 500 MG tablet Take 1 tablet (500 mg total) by mouth daily as needed.   [DISCONTINUED] doxycycline (VIBRAMYCIN) 100 MG capsule Take 1 capsule (100 mg total) by mouth 2 (two) times daily.   No facility-administered encounter medications on file as of 06/22/2022.    Review of Systems:  Review of Systems  Constitutional:  Negative for appetite change, chills, fatigue and fever.  HENT:  Negative for congestion, ear discharge, ear pain, hearing loss, rhinorrhea and sore throat.        Right ear itching  Eyes: Negative.   Respiratory:  Negative for cough, shortness of breath and wheezing.   Cardiovascular:  Negative for chest pain, palpitations and leg swelling.  Gastrointestinal:  Negative for abdominal pain, constipation, diarrhea, nausea and vomiting.  Genitourinary:  Negative for dysuria.  Musculoskeletal:  Negative for arthralgias, back pain and myalgias.  Skin:  Negative for color change, rash and wound.  Neurological:  Positive for dizziness. Negative for weakness and headaches.  Psychiatric/Behavioral:  Negative for behavioral problems. The patient is not nervous/anxious.     Health Maintenance  Topic Date Due   Zoster Vaccines- Shingrix (2 of 2) 08/07/2017   COVID-19 Vaccine (4 - 2023-24 season) 02/10/2022    Medicare Annual Wellness (AWV)  03/03/2023   DTaP/Tdap/Td (3 - Td or Tdap) 11/19/2029   Pneumonia Vaccine 41+ Years old  Completed   INFLUENZA VACCINE  Completed   DEXA SCAN  Completed   HPV VACCINES  Aged Out   Hepatitis C Screening  Discontinued    Physical Exam: Vitals:   06/22/22 1059  BP: 122/80  Pulse: 63  Temp: (!) 96.2 F (35.7 C)  SpO2: 96%  Weight: 144 lb (65.3 kg)  Height: '5\' 1"'$  (1.549 m)   Body mass index is 27.21 kg/m. Physical Exam Constitutional:      Appearance: Normal appearance.  HENT:     Head: Normocephalic and atraumatic.     Nose: Nose normal.     Mouth/Throat:     Mouth: Mucous membranes are moist.  Eyes:     Conjunctiva/sclera: Conjunctivae normal.  Cardiovascular:     Rate and Rhythm: Normal rate and regular rhythm.  Pulmonary:     Effort: Pulmonary effort is normal.     Breath sounds: Normal breath sounds.  Abdominal:     General: Bowel sounds are normal.     Palpations: Abdomen is soft.  Musculoskeletal:        General: Normal range of motion.     Cervical back: Normal range of motion.  Skin:    General: Skin is warm and dry.  Neurological:     General: No focal deficit present.     Mental Status: She is alert and oriented to person, place, and time.  Psychiatric:        Mood and Affect: Mood normal.        Behavior: Behavior normal.        Thought Content: Thought content normal.        Judgment: Judgment normal.     Labs reviewed: Basic Metabolic Panel: Recent Labs    07/07/21 1012 01/04/22 1500 03/28/22 1502 05/15/22 1231 05/22/22 1336  NA 141   < > 143 141 138  K 4.4   < > 4.5 5.2 4.0  CL  101   < > 105 102 101  CO2 25   < > '29 25 25  '$ GLUCOSE 101*   < > 92 90 181*  BUN 11   < > '14 14 15  '$ CREATININE 0.80   < > 0.94 1.09* 0.97  CALCIUM 9.2   < > 9.4 10.4* 9.3  TSH 2.490  --   --  3.220  --    < > = values in this interval not displayed.   Liver Function Tests: Recent Labs    01/04/22 1500 03/28/22 1502  05/15/22 1231  AST '24 23 31  '$ ALT '14 19 24  '$ ALKPHOS  --   --  78  BILITOT 0.4 0.3 0.3  PROT 7.3 6.9 7.3  ALBUMIN  --   --  4.8   No results for input(s): "LIPASE", "AMYLASE" in the last 8760 hours. No results for input(s): "AMMONIA" in the last 8760 hours. CBC: Recent Labs    03/28/22 1502 05/15/22 1231 05/22/22 1336  WBC 4.8 6.1 5.8  NEUTROABS 2,966 4.1 4,356  HGB 12.2 13.3 12.7  HCT 36.5 40.0 38.0  MCV 98.1 98* 100.3*  PLT 247 254 243   Lipid Panel: Recent Labs    10/18/21 1055  CHOL 234*  HDL 82  LDLCALC 124*  TRIG 163*  CHOLHDL 2.9   Lab Results  Component Value Date   HGBA1C 5.9 (H) 03/28/2022    Procedures since last visit: MM 3D SCREEN BREAST BILATERAL  Result Date: 06/15/2022 CLINICAL DATA:  Screening. EXAM: DIGITAL SCREENING BILATERAL MAMMOGRAM WITH TOMOSYNTHESIS AND CAD TECHNIQUE: Bilateral screening digital craniocaudal and mediolateral oblique mammograms were obtained. Bilateral screening digital breast tomosynthesis was performed. The images were evaluated with computer-aided detection. COMPARISON:  Previous exam(s). ACR Breast Density Category b: There are scattered areas of fibroglandular density. FINDINGS: In the right breast, a possible asymmetry warrants further evaluation. In the left breast, no findings suspicious for malignancy. IMPRESSION: Further evaluation is suggested for possible asymmetry in the right breast. RECOMMENDATION: Diagnostic mammogram and possibly ultrasound of the right breast. (Code:[id number]) The patient will be contacted regarding the findings, and additional imaging will be scheduled. BI-RADS CATEGORY  0: Incomplete. Need additional imaging evaluation and/or prior mammograms for comparison. Electronically Signed   By: Dorise Bullion III M.D.   On: 06/15/2022 16:24    Assessment/Plan  1. Dizziness - probably due to side effect of Memantine, recent sinus infection and dehydration - POC Urinalysis Dipstick was negative - meclizine  (ANTIVERT) 12.5 MG tablet; Take 1 tablet (12.5 mg total) by mouth 3 (three) times daily as needed for up to 14 days for dizziness.  Dispense: 30 tablet; Refill: 0  2. MCI (mild cognitive impairment) -  decrease Memantine to 10 mg at bedtime -  follow up with neurology     Labs/tests ordered:  POC urine dipstick   Next appt:  06/30/2022

## 2022-06-23 ENCOUNTER — Ambulatory Visit
Admission: RE | Admit: 2022-06-23 | Discharge: 2022-06-23 | Disposition: A | Discharge status: Home / Self Care | Payer: Medicare Other | Source: Ambulatory Visit | Attending: Family Medicine | Admitting: Family Medicine

## 2022-06-23 DIAGNOSIS — R928 Other abnormal and inconclusive findings on diagnostic imaging of breast: Secondary | ICD-10-CM | POA: Diagnosis not present

## 2022-06-23 DIAGNOSIS — N6489 Other specified disorders of breast: Secondary | ICD-10-CM | POA: Diagnosis not present

## 2022-06-26 ENCOUNTER — Encounter: Payer: Self-pay | Admitting: Family Medicine

## 2022-06-27 ENCOUNTER — Other Ambulatory Visit: Payer: Self-pay | Admitting: Family Medicine

## 2022-06-27 ENCOUNTER — Other Ambulatory Visit: Payer: Self-pay | Admitting: Family

## 2022-06-27 DIAGNOSIS — E785 Hyperlipidemia, unspecified: Secondary | ICD-10-CM

## 2022-06-27 DIAGNOSIS — F339 Major depressive disorder, recurrent, unspecified: Secondary | ICD-10-CM

## 2022-06-30 ENCOUNTER — Ambulatory Visit: Payer: Medicare Other

## 2022-06-30 DIAGNOSIS — M810 Age-related osteoporosis without current pathological fracture: Secondary | ICD-10-CM

## 2022-06-30 MED ORDER — DENOSUMAB 60 MG/ML ~~LOC~~ SOSY
60.0000 mg | PREFILLED_SYRINGE | Freq: Once | SUBCUTANEOUS | Status: AC
  Administered 2022-06-30: 60 mg via SUBCUTANEOUS

## 2022-07-03 ENCOUNTER — Ambulatory Visit (INDEPENDENT_AMBULATORY_CARE_PROVIDER_SITE_OTHER): Payer: Medicare Other | Admitting: Neurology

## 2022-07-03 DIAGNOSIS — G3184 Mild cognitive impairment, so stated: Secondary | ICD-10-CM

## 2022-07-03 DIAGNOSIS — G309 Alzheimer's disease, unspecified: Secondary | ICD-10-CM

## 2022-07-03 NOTE — Patient Instructions (Signed)
Stay on one pill Namenda/Memantine Start Aricept Patch once weekly Find results of the blood studies Order Scan brain (Amyloid vs FDG Pet which was declined) 4 weeks phone call

## 2022-07-03 NOTE — Progress Notes (Signed)
[facility]    Provider:  Dr Jaynee Eagles Requesting Provider: Wardell Honour, MD Primary Care Provider:  Wardell Honour, MD   Virtual Visit via Telephone Note  I connected with Carol Perez on 07/03/2022 at  1:30 PM EST by telephone and verified that I am speaking with the correct person using two identifiers.  Location: Patient: home Provider: office   I discussed the limitations, risks, security and privacy concerns of performing an evaluation and management service by telephone and the availability of in person appointments. I also discussed with the patient that there may be a patient responsible charge related to this service. The patient expressed understanding and agreed to proceed.  Follow Up Instructions:    I discussed the assessment and treatment plan with the patient. The patient was provided an opportunity to ask questions and all were answered. The patient agreed with the plan and demonstrated an understanding of the instructions.   The patient was advised to call back or seek an in-person evaluation if the symptoms worsen or if the condition fails to improve as anticipated.  I provided 30 minutes of non-face-to-face time during this encounter.   Carol Beam, MD  CC: cognitive decline  07/03/2022: Patient reports Short term memory loss. Bad direction sense. She has trouble coming up with words. She is forgetting conversations but it comes back. APoE4 was E3/E3. Had diarrhea on Ariceot. Started Namenda and started having dizziness and no ear infection she decreased the nemenda and I have advised her to stop it because dizziness still occurring. STOP NAMENDA. Has not tried the adlarity yet. FDG PET Scan was denied.   Patient complains of symptoms per HPI as well as the following symptoms: memory loss . Pertinent negatives and positives per HPI. All others negative   05/15/2022: This is a patient we have been following for patient reported  memory loss.  Last time I saw her was in April, she had formal memory testing completed, largely inconclusive, she was diagnosed with mild cognitive impairment amnestic type and she has a long family history of Alzheimer's with the possibility this could be prodromal Alzheimer's.  She is here for follow-up, SHe had an episode of confusion likely due to UTI. Could not tolerate aricept, diarrhea. Other anticholinestase medications likely to do the same. Difficulty swallowing pills.  We discussed restarting Aricept patch, other medications such as Namenda, we also had a very long conversation about the new monoclonal antibody that is on the market just approved by the FDA in July, we also talked about melatonin which is fine she has had a problem with sleeping, she wakes up with dry mouth, she snores.  We also talked about FDG PET scan and PET amyloid scan.  I answered all questions. Patient complains of symptoms per HPI as well as the following symptoms: none . Pertinent negatives and positives per HPI. All others negative  09/12/2021: Here for followup after formal memory testing completed.  Memory testing was largely inconclusive, I reviewed Dr. McDermott's notes with patient, but patient never followed back up with Dr. Vikki Ports so that he could review all the testing with her which I highly recommend and asked them to call the office.  She was diagnosed with mild cognitive impairment amnestic type, she has a long family history of Alzheimer's, could be prodromal Alzheimer's at this time unclear.  She will need to undergo a repeat likely in a year if her symptoms worsen, in the meantime given the uncertainty of  her memory loss I am going to recommend an FDG PET scan which is highly sensitive and specific to Alzheimer's versus frontotemporal dementia.  No suspicion for vascular dementia given her MRI of the brain.  Anesthesia and normal cognitive aging could be contributory, but although she was diagnosed with MCI:  "Test results revealed intact functioning across most cognitive domains and thinking skills assessed during this evaluation with the exception of exceptionally low performance on certain aspects of memory (i.e. new learning and free recall of contextual information and visually presented material), but with intact verbal free recall and recognition. Other intact areas included executive functions, attention and processing speed, language, and visuospatial judgment. From an emotional standpoint, on self-report measures, there appears to be at least a mild degree of recent depression.  [name] performance evidences mild neurocognitive challenges primarily manifesting as difficulties with certain aspects of learning and memory. At present, an exact etiology is unclear, though we have to entertain the possibility of a prodromal neurodegenerative process but nothing has been substantiated of yet. It is possible that the surgery and subsequent complications could be playing a role (or at least contributing) but further follow-up and monitoring is recommended  REFERRING DIAGNOSIS:  Memory changes   FINAL DIAGNOSIS (ICD-10 considerations):  Mild Neurocognitive Disorder (i.e., mild cognitive impairment), amnestic (etiology unclear)"   06/28/2021: She has major low back surgery in 2022 and she was under anesthesia for a long time. She feels her memory is impaired since, she can't remember names, she can't remember if she took her pills. She hs alzheimer's and strokes in her family. She is perfoeming hr own IADLs and ADLs. Her husband has to check your bills. She drives fine. No accidents in the home, people are noticing hr husband. She is having word-finding difficulty.   MRI brain 06/2021: IMPRESSION: personally reviewed and agree with findings (addition 10 minutes to appointment)   MRI brain (with and without) demonstrating: - Stable, mild periventricular and subcortical foci of nonspecific T2  hyperintensities.  No abnormal lesions are seen on post contrast views.   - No acute findings.  Labs 07/07/2021: B12 folate b1, mma, homocysteine, tsh unremarkable   Patient complains of symptoms per HPI as well as the following symptoms: MCI . Pertinent negatives and positives per HPI. All others negative    HPI:  Carol Perez is a 82 y.o. female here as requested by Wardell Honour, MD for head pain. She woke up one morning with pain in the right scalp. Impulses. They got severe that evening, she couldn't sleep, she tried CBD and melatonin, shooting pain behind the ear, not burning, she took Gabapentin (she had some for her low back pain and sciatica). She has seen a neurosurgeon and he was worried about her neck. She has a lot of neck pain. The pain is gone. The symptoms are better and she has not had it for a few days. Her low back is better. But she has a lot of neck problems, she has seen a neurosurgeon in the past. She exercises at least 5 morning a week, she exercises her neck. She has tightness in her neck. She has very tight neck muscles.   Reviewed notes, labs and imaging from outside physicians, which showed:  Personally reviewed images of MRI of the brain and MRI trigeminal protocol, essentially normal for age, some very minimal white matter changes, no vascular compression.  Bmp,cbc unremarkable  Reviewed notes from emergency room visit where she was seen for right  temporal sharp pain which does not radiate she reported 7 out of 10 pain which was sudden and lasting for 2 days waxing and waning no worsening to bright light coughing or straining, no other associated symptoms, examination and neurologic examination were unremarkable and nonfocal.  She did not have tenderness in the area, her pains could not be provoked, there were no rashes or focal deficits on exam, normal gait, they were concerned for trigeminal neuralgia.  She was given gabapentin 3 times daily.  Review of  Systems: Patient complains of symptoms per HPI as well as the following symptoms: memory loss . Pertinent negatives and positives per HPI. All others negative    Social History   Socioeconomic History   Marital status: Married    Spouse name: Not on file   Number of children: 2   Years of education: Not on file   Highest education level: Associate degree: occupational, Hotel manager, or vocational program  Occupational History   Occupation: Retired   Tobacco Use   Smoking status: Former    Types: Cigarettes    Start date: 06/12/1977   Smokeless tobacco: Never   Tobacco comments:    42 years ago. Smoked from age [age]  Vaping Use   Vaping Use: Never used  Substance and Sexual Activity   Alcohol use: Yes    Alcohol/week: 7.0 standard drinks of alcohol    Types: 7 Shots of liquor per week    Comment: one gin and tonic at night   Drug use: No   Sexual activity: Not on file  Other Topics Concern   Not on file  Social History Narrative   Tobacco use, amount per day now: No   Past tobacco use, amount per day: Smoked ages [age]   How many years did you use tobacco: 17-20 years   Alcohol use (drinks per week): 1 glass of Red Wine per day.   Diet: Good   Do you drink/eat things with caffeine: No   Marital status: Married                                  What year were you married? 1978   Do you live in a house, apartment, assisted living, condo, trailer, etc.? Apartment/Independent Living   Is it one or more stories? 1   How many persons live in your home? 2    Do you have pets in your home?( please list) No   Highest Level of Education completed? 2 years of college.    Current or past profession: Retired Futures trader   Do you exercise?  Yes                                Type and how often? 48mn workout 5 days week. Walking 3-4 Days a Week.    Do you have a living will? Yes   Do you have a DNR form?  Yes                                 If not, do you want to discuss one?   Do  you have signed POA/HPOA forms? Yes                       If so, please bring  to you appointment      Do you have difficulty bathing or dressing yourself? No   Do you have difficulty preparing food or eating? No   Do you have difficulty managing your medications? No   Do you have difficulty managing your finances? No   Do you have difficulty affording your medications? No       Per [facility] New Patient Packet. Abstracted by Jasmine/RMA.    Social Determinants of Health   Financial Resource Strain: Not on file  Food Insecurity: Not on file  Transportation Needs: Not on file  Physical Activity: Not on file  Stress: Not on file  Social Connections: Not on file  Intimate Partner Violence: Not on file    Family History  Problem Relation Age of Onset   Cancer Mother    Dementia Mother    Alzheimer's disease Mother    Cancer Father    Alzheimer's disease Maternal Grandmother    Dementia Maternal Grandmother    Alzheimer's disease Maternal Grandfather     Past Medical History:  Diagnosis Date   Barrett's esophagus    Colitis    DDD (degenerative disc disease)    Factor 5 Leiden mutation, heterozygous [facility])    Per [place] New Patient Packet.   Frequent UTI    H/O echocardiogram 04/01/2019   Per [place] Patient Packet.   Hiatal hernia    Per [place] New Patient Packet.   High grade dysplasia of Barrett's epithelium    Per [facility] New Patient Packet.   History of bladder infections    Per [facility] New Patient Packet.   History of Papanicolaou smear of cervix 10/16/2011   Per [place] Patient Packet   Hypoglycemia    Hypotension    Per [facility] New Patient Packet.   IBS (irritable bowel syndrome)    Per [place] New Patient Packet.   Lactose intolerance    Osteoarthritis    Per [place] New Patient Packet.   Osteopenia    Per [place] New Patient Packet.   Osteoporosis    Premature ventricular complex    Raynaud's disease    Per [place] Patient Packet.   Scoliosis    Per [facility] New Patient Packet.     Patient Active Problem List   Diagnosis Date Noted   MCI (mild cognitive impairment) 05/16/2022   FHx: Alzheimer's disease 05/16/2022   Nocturnal leg cramps 11/23/2021   Overactive bladder 11/23/2021   Depression, recurrent ([place]) 08/24/2020   Atrophic vaginitis 08/24/2020   Blood loss anemia 08/24/2020   Hyponatremia    Lumbar scoliosis 08/12/2020   Occipital neuralgia of right side 09/08/2019   Recurrent UTI 06/26/2019   Osteoporosis 06/26/2019   IBS (irritable bowel syndrome) 06/26/2019   Hyperlipidemia 06/26/2019   Back pain 06/26/2019   Barrett's esophagus 06/26/2019    Past Surgical History:  Procedure Laterality Date   ABDOMINAL EXPOSURE N/A 08/12/2020   Procedure: ABDOMINAL EXPOSURE;  Surgeon: Serafina Mitchell, MD;  Location: [place];  Service: Vascular;  Laterality: N/A;   ABDOMINAL HYSTERECTOMY  06/13/2011   By Dr[facility]Scherer at [facility]. Per [facility] New Patient Packet.   ANTERIOR LAT LUMBAR FUSION Right 08/12/2020   Procedure: Right Lumbar two-three, Lumbar three-four, Lumbar four-five Anterolateral lumbar interbody fusion;  Surgeon: Erline Levine, MD;  Location: [place];  Service: Neurosurgery;  Laterality: Right;   ANTERIOR LUMBAR FUSION N/A 08/12/2020   Procedure: Lumbar five Sacral one Anterior lumbar interbody fusion;  Surgeon: Erline Levine, MD;  Location: [place];  Service: Neurosurgery;  Laterality: N/A;   BREAST EXCISIONAL BIOPSY Left 30+ yrs ago   COLONOSCOPY  11/23/2011   By Dr.McCune at [facility]. Per [place] Patient Packet.   COLONOSCOPY  08/26/2019   LUMBAR PERCUTANEOUS PEDICLE SCREW 4 LEVEL N/A 08/12/2020   Procedure: Percutaneous pedicle screw fixation from Lumbar two to Sacral one ;  Surgeon: Erline Levine, MD;  Location: [place];  Service: Neurosurgery;  Laterality: N/A;   MAMMOGRAM  04/14/2019   pap smear  10/16/2011   SIGMOIDOSCOPY  11/02/2014   Per [place] New Patient Packet   TONSILLECTOMY  06/12/1946   Per [facility] New Patient Packet.     Current Outpatient Medications  Medication Sig Dispense Refill   aspirin EC 81 MG tablet Take 81 mg by mouth 3 (three) times daily.     atorvastatin (LIPITOR) 10 MG tablet TAKE ONE (1) TABLET BY MOUTH EACH DAY 90 tablet 1   AZO-CRANBERRY PO Take 1 capsule by mouth as needed (UTI Symptoms).     Bismuth Subsalicylate (PEPTO-BISMOL PO) Take by mouth as needed.     buPROPion (WELLBUTRIN XL) 150 MG 24 hr tablet TAKE ONE (1) TABLET BY MOUTH EVERY DAY 30 tablet 3   CALCIUM PO Take 1,200 mg by mouth daily.      Cholecalciferol (VITAMIN D3) 50 MCG (2000 UT) TABS Take 2,000 Units by mouth in the morning, at noon, and at bedtime.     conjugated estrogens (PREMARIN) vaginal cream Place 1 applicator vaginally once a week.     denosumab (PROLIA) 60 MG/ML SOSY injection Inject 60 mg into the skin every 6 (six) months.     Diclofenac Sodium (MOTRIN ARTHRITIS PAIN EX) Apply topically.     Donepezil HCl (ADLARITY) 5 MG/DAY PTWK Place 5 mg onto the skin once a week. 4 patch 0   Glucosamine HCl 1500 MG TABS Take 1,500 mg by mouth daily.     Homeopathic Products (SIMILASAN DRY EYE RELIEF OP) Apply to eye.     meclizine (ANTIVERT) 12.5 MG tablet Take 1 tablet (12.5 mg total) by mouth 3 (three) times daily as needed for up to 14 days for dizziness. 30 tablet 0   memantine (NAMENDA) 10 MG tablet Start with one pill at bedtime and in 1 -2 weeks if no side effects increase to twice daily 60 tablet 6   mesalamine (LIALDA) 1.2 g EC tablet Take 1.2 g by mouth daily.     methenamine (HIPREX) 1 g tablet Take 1 g by mouth 2 (two) times daily with a meal. For UTI Prevention. Last UTI: October 20th, November 5th, and December 28th.     Multiple Vitamin (MULTIVITAMIN WITH MINERALS) TABS tablet Take 1 tablet by mouth daily. One A Day for Women     Omeprazole-Sodium Bicarbonate (ZEGERID) 20-1100 MG CAPS capsule Take 1 capsule by mouth 2 (two) times daily.     OVER THE COUNTER MEDICATION Place 1 drop into both ears as needed.  Grapefruit Seed Extract Oil     Polyethyl Glycol-Propyl Glycol (SYSTANE) 0.4-0.3 % SOLN Place 1 drop into both eyes as needed (dry/irritated eyes).     Probiotic Product (PROBIOTIC COLON SUPPORT PO) Take 1 tablet by mouth in the morning and at bedtime.     psyllium (METAMUCIL) 58.6 % powder Take 1 packet by mouth 3 (three) times daily.     valACYclovir (VALTREX) 500 MG tablet Take 1 tablet (500 mg total) by mouth daily as needed. 270 tablet 0   No current  facility-administered medications for this visit.    Allergies as of 07/03/2022 - Review Complete 06/22/2022  Allergen Reaction Noted   Banana Swelling 09/08/2019   Latex Swelling, Rash, and Other (See Comments) 01/14/2011   Ciprofloxacin hcl Other (See Comments) 08/03/2013   Gatifloxacin Other (See Comments) 08/03/2013   Levofloxacin Other (See Comments) 08/03/2013   Moxifloxacin Other (See Comments) 08/03/2013   Norfloxacin Other (See Comments) 08/03/2013   Nsaids Other (See Comments) 01/14/2011   Ofloxacin Other (See Comments) 08/03/2013   Other Other (See Comments) 09/08/2019   Penicillins Hives 01/14/2011   Methylisothiazolinone Rash and Other (See Comments) 04/12/2017   Tape Rash 04/12/2017    Vitals: There were no vitals taken for this visit. Last Weight:  Wt Readings from Last 1 Encounters:  06/22/22 144 lb (65.3 kg)   Last Height:   Ht Readings from Last 1 Encounters:  06/22/22 '5\' 1"'$  (1.549 m)  Physical exam: Exam: Gen: NAD, conversant  Speech:    Speech is normal; fluent and spontaneous with normal comprehension.  Cognition:    The patient is oriented to person, place, and time;     recent and remote memory intact;     language fluent;     normal attention, concentration,     fund of knowledge     05/15/2022   11:05 AM 03/28/2022    2:51 PM 01/04/2022    2:48 PM  MMSE - Mini Mental State Exam  Orientation to time '5 3 5  '$ Orientation to Place '5 5 4  '$ Registration '3 3 3  '$ Attention/ Calculation '2 5 5   '$ Recall 3 3 0  Language- name 2 objects '2 2 2  '$ Language- repeat '1 1 1  '$ Language- follow 3 step command '3 3 3  '$ Language- read & follow direction '1 1 1  '$ Write a sentence '1 1 1  '$ Copy design '1 1 1  '$ Total score '27 28 26     '$ Assessment/Plan: This is a really lovely 82 year old with memory loss. MRI unremarkable for age, EEG. Formal memory tsting with Dr. Vikki Ports revealed MCI amnestic type possibly per prodromal Alzheimer's and she has a family history of Alzheimer's  1.Did not tolerate oral aricept. Memantine BID made her dizzy. She will stay on '10mg'$  memantine daily instead of bid.  2. Aricept Patch - keep in the fridage, wait and start until AFTER you are on namenda/menantine twice daily and doing well 4-6 weeks. She is going to start it now.  3. Both of the above are used at the same time concurrently to slow down memory loss.   4. Consider Lecanemab - new, just approved by FDA in July, provided information and answered questions  5. Melatonin ok at night 1-5 mg at bedtime.   6. Wakes up with dry mouth, she snores, naps, memory loss, would like to send her for sleep test, wakes often. Sleep apnea can be a risk factor for dementia  7. FDG PET Scan - declined. Will ask for PET Amyloid to see if she can be a candidate for Lecanemab  8. Does NOT have an Apoe4 gene. ATN did not result, will ask her to come back and repeat.   Orders Placed This Encounter  Procedures   NM PET Brain Amyloid   ATN PROFILE   No orders of the defined types were placed in this encounter.    Reviewed the following with patient and husband from dr. Mcdermott's notes:  SUMMARY & IMPRESSION: Mrs. Zaiyah Sottile is a 82 year old, right-handed,  White female, who reported experiencing mild cognitive challenges that began within the past 2 years and were exacerbated approximately 1 year ago by a major spinal surgery that took place (March 2022). She reported some complications following the surgery that prolonged her  hospitalization to include hyponatremia and pneumonia. While she feels that she has improved to a degree, she has yet to return to baseline.  Test results revealed intact functioning across most cognitive domains and thinking skills assessed during this evaluation with the exception of exceptionally low performance on certain aspects of memory (i.e. new learning and free recall of contextual information and visually presented material), but with intact verbal free recall and recognition. Other intact areas included executive functions, attention and processing speed, language, and visuospatial judgment. From an emotional standpoint, on self-report measures, there appears to be at least a mild degree of recent depression.  Ms. Chillemi performance evidences mild neurocognitive challenges primarily manifesting as difficulties with certain aspects of learning and memory. At present, an exact etiology is unclear, though we have to entertain the possibility of a prodromal neurodegenerative process but nothing has been substantiated of yet. It is possible that the surgery and subsequent complications could be playing a role (or at least contributing) but further follow-up and monitoring is recommended  REFERRING DIAGNOSIS:  Memory changes   FINAL DIAGNOSIS (ICD-10 considerations):  Mild Neurocognitive Disorder (i.e., mild cognitive impairment), amnestic (etiology unclear)  RECOMMENDATIONS: 1. Follow-up with Dr. Jaynee Eagles.   Could consider treatment with cholinesterase inhibitor.   It is recommended that the patient aggressively manage any modifiable risk factors for further cognitive decline such as strict compliance with prescribed medical treatments for any cerebrovascular risk factors (e.g., high cholesterol, high blood pressure, sleep apnea, diabetes).  Follow-up with the [facility] and [facility] is recommended in 12 months for educational  purposes, psychosocial concerns, and neurocognitive testing as necessary. If you wish to make this follow-up appointment, please contact our office at [phone].  2. In response to the growing number of families affected by memory loss and dementia, the [facility] Program (MCP) was established in 2011 with the support of the Section on Gerontology and Geriatric Medicine at [facility]. The MCP provides counseling services for individuals diagnosed with mild cognitive impairment (MCI), Alzheimer's disease, or another form of dementia, as well as to their family members. Services include individual, couple, and family counseling, as well as support groups, all of which provide a safe environment to talk about the journey with mild cognitive impairment or dementia, learn as much as possible about the disease, problem solve some of the common challenges encountered with memory and cognitive loss, and strengthen relationships.  The MCP is staffed by licensed practitioners. All services are rendered in a professional manner consistent with the ethical standards of the [employer] and the [employer].  The MCP is located in the [facility] on the [address]. In general, we see clients every few weeks or monthly, depending on the need. Sessions are typically 45-60 minutes long, scheduled at mutually agreed upon times. Referrals can be made by a health care professional or by self-referral.   Contact: [phone]  3. The patient is encouraged to attend to lifestyle factors for brain health (e.g., regular physical exercise, good nutrition habits, regular participation in cognitively-stimulating activities, and general stress management techniques), which are likely to have benefits for both emotional adjustment and cognition.  In fact, in addition to promoting general good health, regular exercise incorporating  aerobic activities (e.g., brisk walking, jogging, bicycling, etc.) has been demonstrated to be a very effective treatment for depression and stress, with similar efficacy rates to both antidepressant medication and psychotherapy. And for those with orthopedic issues, water aerobics may be particularly beneficial.  4. Nutritional factors can have a significant effect on psychological and emotional status, as well as overall brain functioning. The following general recommendations have been associated with improvements in depression and other psychological symptoms, as well as lower risk for dementia and other forms of cognitive impairment. Please discuss these recommendations with your physician and/or dietitian before initiating:   Consume a wide variety of fresh fruits and vegetables, particularly including brightly colored items such as berries, oranges, tomatoes, peppers, carrots, broccoli, spinach, dark green lettuces, sweet potatoes, etc., all of which are high in vitamins and antioxidants.   Consume foods that are high in fiber, such as legumes (e.g., beans, peas, lentils) and foods made from whole grains (e.g., whole wheat bread and pasta)   Consume a significant amount of omega-3 essential fats and oils. These can be found in natural food sources such as salmon and other fatty fish, and also products made from flax seed and flax seed oil. Alternately, dietary supplementation with fish oil capsules and flax seed oil capsules is a good way to boost one's level of omega-3 consumption. It is important to check with your doctor before taking these supplements, especially if you take blood thinning medication.   If you do not already do so, consider taking a quality multivitamin supplement under the guidance of your primary care physician.    Consider keeping consumption of the following foods to a minimum: 1) foods made from white flour and white sugar; 2) artificial sweeteners; 3) deep-fried foods;  4) animal fat other than fish; 5) any foods containing "hydrogenated" or "trans" fats; 6) most other types of highly processed packaged/prepared foods.   5. Try to keep in mind that common word finding errors are not necessarily the start of a dreadful decline. Over-focusing on these errors can contribute to further distraction and emotions that can detract from effective retrieval of words; this in turn, can lead to greater distress and more difficulties with recalling the specific word you were looking for to begin with.   The following are several strategies that may help:   Performance will generally be best in a structured, routine, and familiar environment, as opposed to situations involving complex problems.   Designate a place to keep your keys, wallet, cell phone, and other personal belongings.  Take time to register and process information to be remembered. Deeper encoding of information can be gained by forming a mental picture, making meaningful associations, connecting new information to previously learned and related information, paraphrasing and repetition.   To the extent possible, multitasking should be avoided; break down tasks into smaller steps to help get started and to keep from feeling overwhelmed. And if there are difficulties in organization and planning, maintaining a daily organizer to help keep track of important appointments and information may be beneficial.   Memory problems may at least be minimally addressed using compensatory strategies such as the use of a daily schedule to follow, memos, portable recorder, a centrally located bulletin board, or memory notebook. A large calendar, placed in a highly visible location would be valuable to keep track of dates and appointments. In addition, it would be helpful to keep a  log of all of medical appointments with the name of the doctor, date of visit, diagnoses, and treatments.   Use of a medication box is recommended to ensure  compliance and decrease confusion regarding medication dosages, times, and dates.  To aid in attention, the patient may consider using some of the following strategies: o The patient should simplify tasks. There may be a need to break overly complex activities into simple step-by-step tasks, keep these steps written down in a note book and then check them off as they are completed which will help to stay on task and make sure the whole task is finished.  o The patient should set deadlines for everything, even for seemingly small tasks, prioritize time-sensitive tasks and write down every assignment, message, or important thought. o The patient is encouraged to use timers and alarms to stay on track and take breaks at regular intervals. Avoid piles of paperwork or procrastination by dealing with each item as it comes in.      Cc: Wardell Honour, MD,  Wardell Honour, MD  Sarina Ill, MD  [facility] [address] [place], [place] [address]  Phone [phone] Fax [phone]

## 2022-07-04 ENCOUNTER — Telehealth: Payer: Self-pay | Admitting: Neurology

## 2022-07-04 NOTE — Telephone Encounter (Signed)
Patient's ATN lab did not result (markers for alzheimers in the blood) please ask her to come by one day and retake. Spoke to lab, not sure what happened thanks

## 2022-07-05 NOTE — Telephone Encounter (Signed)
Spoke with patient. She will come by the office tomorrow morning between 8 and 12 for the lab.

## 2022-07-06 ENCOUNTER — Other Ambulatory Visit (INDEPENDENT_AMBULATORY_CARE_PROVIDER_SITE_OTHER): Payer: Self-pay

## 2022-07-06 DIAGNOSIS — Z820 Family history of epilepsy and other diseases of the nervous system: Secondary | ICD-10-CM

## 2022-07-06 DIAGNOSIS — G309 Alzheimer's disease, unspecified: Secondary | ICD-10-CM

## 2022-07-06 DIAGNOSIS — G3184 Mild cognitive impairment, so stated: Secondary | ICD-10-CM | POA: Diagnosis not present

## 2022-07-06 DIAGNOSIS — F0280 Dementia in other diseases classified elsewhere without behavioral disturbance: Secondary | ICD-10-CM

## 2022-07-06 DIAGNOSIS — R410 Disorientation, unspecified: Secondary | ICD-10-CM

## 2022-07-06 DIAGNOSIS — Z0289 Encounter for other administrative examinations: Secondary | ICD-10-CM

## 2022-07-10 ENCOUNTER — Ambulatory Visit (INDEPENDENT_AMBULATORY_CARE_PROVIDER_SITE_OTHER): Payer: Medicare Other | Admitting: Family Medicine

## 2022-07-10 ENCOUNTER — Encounter: Payer: Self-pay | Admitting: Family Medicine

## 2022-07-10 VITALS — BP 130/61 | HR 81 | Ht 61.0 in | Wt 149.0 lb

## 2022-07-10 DIAGNOSIS — Z23 Encounter for immunization: Secondary | ICD-10-CM

## 2022-07-10 DIAGNOSIS — R7301 Impaired fasting glucose: Secondary | ICD-10-CM | POA: Diagnosis not present

## 2022-07-10 DIAGNOSIS — N390 Urinary tract infection, site not specified: Secondary | ICD-10-CM | POA: Diagnosis not present

## 2022-07-10 DIAGNOSIS — G3184 Mild cognitive impairment, so stated: Secondary | ICD-10-CM | POA: Diagnosis not present

## 2022-07-10 DIAGNOSIS — H04129 Dry eye syndrome of unspecified lacrimal gland: Secondary | ICD-10-CM | POA: Diagnosis not present

## 2022-07-10 DIAGNOSIS — G5793 Unspecified mononeuropathy of bilateral lower limbs: Secondary | ICD-10-CM | POA: Insufficient documentation

## 2022-07-10 DIAGNOSIS — Z820 Family history of epilepsy and other diseases of the nervous system: Secondary | ICD-10-CM | POA: Diagnosis not present

## 2022-07-10 NOTE — Assessment & Plan Note (Signed)
He also has neuropathy in both feet.  She had been using a cream which was helpful but recently found a supplement it is primarily B vitamins and wanted to know if it was okay to take it.  We did discuss that it is okay to continue as long as she checks her other supplements that she is taking and making sure that she is not taking excess B vitamins.  Discussed that too much vitamin B can actually cause neuropathy.

## 2022-07-10 NOTE — Assessment & Plan Note (Signed)
Uses drops for her eyes.

## 2022-07-10 NOTE — Assessment & Plan Note (Signed)
Currently  on donepezil patch and Namenda.  Following with Dr. Lavell Anchors.

## 2022-07-10 NOTE — Progress Notes (Signed)
New Patient Office Visit  Subjective    Patient ID: Carol Perez, female    DOB: [birth year]  Age: 82 y.o. MRN: [id number]  CC:  Chief Complaint  Patient presents with   Establish Care    HPI Carol Perez presents to establish care  She is transferring care from her PCP, Dr. Alain Honey.  She says she absolutely loves Dr. Sabra Heck but is having more difficulty driving and maneuvering by herself into [place] and would like to have something closer to home.  Is been his physician is also retiring so she has decided to come here as well.  She has a history of recurrent UTIs and overactive bladder.  She recently had an episode of memory loss that lasted for about 15 minutes and since then has had problems with her memory.  She follows with Dr. Lavell Anchors, neurology.  They are working on trying to get her scheduled for additional evaluation.  She was diagnosed with UTI closer to the acute event.  But was also recently diagnosed with mild cognitive impairment and is currently on an donepezil patch.  She has a history of osteoporosis and is on Prolia every 6 months.  She does not need any medication refills.  She did recently have blood work done in December.  Fasting A1c was 5.9.  Has a new diagnosis of impaired fasting glucose.  Outpatient Encounter Medications as of 07/10/2022  Medication Sig   aspirin EC 81 MG tablet Take 81 mg by mouth 3 (three) times daily.   atorvastatin (LIPITOR) 10 MG tablet TAKE ONE (1) TABLET BY MOUTH EACH DAY   AZO-CRANBERRY PO Take 1 capsule by mouth as needed (UTI Symptoms).   Bismuth Subsalicylate (PEPTO-BISMOL PO) Take by mouth as needed.   buPROPion (WELLBUTRIN XL) 150 MG 24 hr tablet TAKE ONE (1) TABLET BY MOUTH EVERY DAY   CALCIUM PO Take 1,200 mg by mouth daily.    Cholecalciferol (VITAMIN D3) 50 MCG (2000 UT) TABS Take 2,000 Units by mouth in the morning, at noon, and at bedtime.   conjugated estrogens (PREMARIN) vaginal cream Place 1 applicator vaginally  once a week.   denosumab (PROLIA) 60 MG/ML SOSY injection Inject 60 mg into the skin every 6 (six) months.   Diclofenac Sodium (MOTRIN ARTHRITIS PAIN EX) Apply topically.   Donepezil HCl (ADLARITY) 5 MG/DAY PTWK Place 5 mg onto the skin once a week.   Glucosamine HCl 1500 MG TABS Take 1,500 mg by mouth daily.   Homeopathic Products (SIMILASAN DRY EYE RELIEF OP) Apply to eye.   memantine (NAMENDA) 10 MG tablet Start with one pill at bedtime and in 1 -2 weeks if no side effects increase to twice daily   mesalamine (LIALDA) 1.2 g EC tablet Take 1.2 g by mouth daily.   methenamine (HIPREX) 1 g tablet Take 1 g by mouth 2 (two) times daily with a meal. For UTI Prevention. Last UTI: October 20th, November 5th, and December 28th.   Multiple Vitamin (MULTIVITAMIN WITH MINERALS) TABS tablet Take 1 tablet by mouth daily. One A Day for Women   Omeprazole-Sodium Bicarbonate (ZEGERID) 20-1100 MG CAPS capsule Take 1 capsule by mouth 2 (two) times daily.   OVER THE COUNTER MEDICATION Place 1 drop into both ears as needed. Grapefruit Seed Extract Oil   Polyethyl Glycol-Propyl Glycol (SYSTANE) 0.4-0.3 % SOLN Place 1 drop into both eyes as needed (dry/irritated eyes).   Probiotic Product (PROBIOTIC COLON SUPPORT PO) Take 1 tablet by mouth in the  morning and at bedtime.   psyllium (METAMUCIL) 58.6 % powder Take 1 packet by mouth 3 (three) times daily.   valACYclovir (VALTREX) 500 MG tablet Take 1 tablet (500 mg total) by mouth daily as needed.   No facility-administered encounter medications on file as of 07/10/2022.    Past Medical History:  Diagnosis Date   Barrett's esophagus    Colitis    DDD (degenerative disc disease)    Factor 5 Leiden mutation, heterozygous [facility])    Per [place] New Patient Packet.   Frequent UTI    H/O echocardiogram 04/01/2019   Per [place] Patient Packet.   Hiatal hernia    Per [place] New Patient Packet.   High grade dysplasia of Barrett's epithelium    Per [facility] New Patient Packet.    History of bladder infections    Per [facility] New Patient Packet.   History of Papanicolaou smear of cervix 10/16/2011   Per [place] Patient Packet   Hypoglycemia    Hypotension    Per [facility] New Patient Packet.   IBS (irritable bowel syndrome)    Per [place] New Patient Packet.   Lactose intolerance    Osteoarthritis    Per [place] New Patient Packet.   Osteopenia    Per [place] New Patient Packet.   Osteoporosis    Premature ventricular complex    Raynaud's disease    Per [place] Patient Packet.   Scoliosis    Per [facility] New Patient Packet.    Past Surgical History:  Procedure Laterality Date   ABDOMINAL EXPOSURE N/A 08/12/2020   Procedure: ABDOMINAL EXPOSURE;  Surgeon: Serafina Mitchell, MD;  Location: [facility] OR;  Service: Vascular;  Laterality: N/A;   ABDOMINAL HYSTERECTOMY  06/13/2011   By Dr.Scherer at [facility]. Per [facility] New Patient Packet.   ANTERIOR LAT LUMBAR FUSION Right 08/12/2020   Procedure: Right Lumbar two-three, Lumbar three-four, Lumbar four-five Anterolateral lumbar interbody fusion;  Surgeon: Erline Levine, MD;  Location: [place];  Service: Neurosurgery;  Laterality: Right;   ANTERIOR LUMBAR FUSION N/A 08/12/2020   Procedure: Lumbar five Sacral one Anterior lumbar interbody fusion;  Surgeon: Erline Levine, MD;  Location: [place];  Service: Neurosurgery;  Laterality: N/A;   BREAST EXCISIONAL BIOPSY Left 30+ yrs ago   COLONOSCOPY  11/23/2011   By Dr.McCune at [facility]. Per [place] Patient Packet.   COLONOSCOPY  08/26/2019   LUMBAR PERCUTANEOUS PEDICLE SCREW 4 LEVEL N/A 08/12/2020   Procedure: Percutaneous pedicle screw fixation from Lumbar two to Sacral one ;  Surgeon: Erline Levine, MD;  Location: [place];  Service: Neurosurgery;  Laterality: N/A;   MAMMOGRAM  04/14/2019   pap smear  10/16/2011   SIGMOIDOSCOPY  11/02/2014   Per [place] New Patient Packet   TONSILLECTOMY  06/12/1948   Per [facility] New Patient Packet.    Family History  Problem Relation Age of Onset   Cancer  Mother    Dementia Mother    Alzheimer's disease Mother    Cancer Father    Alzheimer's disease Maternal Grandmother    Dementia Maternal Grandmother    Alzheimer's disease Maternal Grandfather     Social History   Socioeconomic History   Marital status: Married    Spouse name: Not on file   Number of children: 2   Years of education: Not on file   Highest education level: Associate degree: occupational, Hotel manager, or vocational program  Occupational History   Occupation: Retired   Tobacco Use  Smoking status: Former    Types: Cigarettes    Start date: 06/12/1977   Smokeless tobacco: Never   Tobacco comments:    42 years ago. Smoked from age [age]  Vaping Use   Vaping Use: Never used  Substance and Sexual Activity   Alcohol use: Yes    Alcohol/week: 7.0 standard drinks of alcohol    Types: 7 Shots of liquor per week    Comment: one gin and tonic at night   Drug use: No   Sexual activity: Not on file  Other Topics Concern   Not on file  Social History Narrative   Tobacco use, amount per day now: No   Past tobacco use, amount per day: Smoked ages [age]   How many years did you use tobacco: 17-20 years   Alcohol use (drinks per week): 1 glass of Red Wine per day.   Diet: Good   Do you drink/eat things with caffeine: No   Marital status: Married                                  What year were you married? 1978   Do you live in a house, apartment, assisted living, condo, trailer, etc.? Apartment/Independent Living   Is it one or more stories? 1   How many persons live in your home? 2    Do you have pets in your home?( please list) No   Highest Level of Education completed? 2 years of college.    Current or past profession: Retired Futures trader   Do you exercise?  Yes                                Type and how often? 34mn workout 5 days week. Walking 3-4 Days a Week.    Do you have a living will? Yes   Do you have a DNR form?  Yes                                  If not, do you want to discuss one?   Do you have signed POA/HPOA forms? Yes                       If so, please bring to you appointment      Do you have difficulty bathing or dressing yourself? No   Do you have difficulty preparing food or eating? No   Do you have difficulty managing your medications? No   Do you have difficulty managing your finances? No   Do you have difficulty affording your medications? No       [facility] Abstracted by Jasmine/RMA.    Social Determinants of Health   Financial Resource Strain: Not on file  Food Insecurity: Not on file  Transportation Needs: Not on file  Physical Activity: Not on file  Stress: Not on file  Social Connections: Not on file  Intimate Partner Violence: Not on file    ROS      Objective    BP 130/61   Pulse 81   Ht '5\' 1"'$  (1.549 m)   Wt 149 lb (67.6 kg)   SpO2 99%   BMI 28.15 kg/m   Physical Exam Vitals and nursing note reviewed.  Constitutional:      Appearance: She is well-developed.  HENT:     Head: Normocephalic and atraumatic.  Cardiovascular:     Rate and Rhythm: Normal rate and regular rhythm.     Heart sounds: Normal heart sounds.  Pulmonary:     Effort: Pulmonary effort is normal.     Breath sounds: Normal breath sounds.  Skin:    General: Skin is warm and dry.  Neurological:     Mental Status: She is alert and oriented to person, place, and time.  Psychiatric:        Behavior: Behavior normal.         Assessment & Plan:   Problem List Items Addressed This Visit       Endocrine   IFG (impaired fasting glucose)    Dust diet and exercise today.  For the most part she really does well so we just discussed making some mild changes and then rechecking the A1c in about 6 months from December.  I will see her back at that time.        Nervous and Auditory   Neuropathy of both feet    He also has neuropathy in both feet.  She had been using a cream which was helpful but  recently found a supplement it is primarily B vitamins and wanted to know if it was okay to take it.  We did discuss that it is okay to continue as long as she checks her other supplements that she is taking and making sure that she is not taking excess B vitamins.  Discussed that too much vitamin B can actually cause neuropathy.        Genitourinary   Recurrent UTI - Primary    Currently on estrogen cream for atrophic vaginitis to reduce recurrent UTIs.        Other   MCI (mild cognitive impairment)    Currently  on donepezil patch and Namenda.  Following with Dr. Lavell Anchors.      FHx: Alzheimer's disease   Dry eye    Uses drops for her eyes.      Other Visit Diagnoses     Need for COVID-19 vaccine       Relevant Orders   Pfizer Fall 2023 Covid-19 Vaccine 67yr and older (Completed)       Return in about 22 weeks (around 12/11/2022) for Pre-diabetes.   CBeatrice Lecher MD

## 2022-07-10 NOTE — Assessment & Plan Note (Signed)
Dust diet and exercise today.  For the most part she really does well so we just discussed making some mild changes and then rechecking the A1c in about 6 months from December.  I will see her back at that time.

## 2022-07-10 NOTE — Assessment & Plan Note (Signed)
Currently on estrogen cream for atrophic vaginitis to reduce recurrent UTIs.

## 2022-07-11 ENCOUNTER — Telehealth: Payer: Self-pay | Admitting: *Deleted

## 2022-07-11 LAB — ATN PROFILE
A -- Beta-amyloid 42/40 Ratio: 0.089 — ABNORMAL LOW (ref >0.102)
Beta-amyloid 40: 245.8 pg/mL
Beta-amyloid 42: 21.89 pg/mL
N -- NfL, Plasma: 3.4 pg/mL (ref 0.00–7.64)
T -- p-tau181: 1.72 pg/mL — ABNORMAL HIGH (ref 0.00–0.97)

## 2022-07-11 NOTE — Telephone Encounter (Signed)
Spoke to patient gave bloodwork results . Pt asked if she had alzheimer's informed patient Dr Jaynee Eagles has placed a order for Pet scan and will review those results before she gives  a diagnosis. . Informed patient waiting  on insurance to approve scan. Patient expressed understanding and thanked me for calling

## 2022-07-11 NOTE — Telephone Encounter (Signed)
-----  Message from Melvenia Beam, MD sent at 07/11/2022  2:05 PM EST ----- Patient has some markers in her blood (amyloid and tau) that can be seen in amyloid pathology. But this is not diagnostic. Will wait and see if we can get the other brain scane ordered thanks.

## 2022-07-12 NOTE — Telephone Encounter (Signed)
Pet amyloid approved  [employer] medicare Josem Kaufmann: [id number] exp. 08/26/22 sent to [place]

## 2022-07-12 NOTE — Telephone Encounter (Signed)
Called pt. She states she was already called this AM and Pet scan is scheduled 07/24/22.

## 2022-07-12 NOTE — Telephone Encounter (Signed)
The hospital will call her to schedule the phone number is [phone]

## 2022-07-12 NOTE — Telephone Encounter (Signed)
Please let patient know so she is aware and waiting for a phone call or give her a number to call and schedule.

## 2022-07-17 ENCOUNTER — Telehealth: Payer: Self-pay | Admitting: *Deleted

## 2022-07-17 NOTE — Telephone Encounter (Signed)
Pt called and stated that she is experiencing some R sided pain at her waist like kidney pain. She reports that she has a hx of frequent UTI's. She has had pain x1 day. Denies any burning with urination but states that her urine is a little darker than it normally is. I asked her if she has felt that she has been urinating more she said that she has been urinating more frequently the past 2-3 days. She is using the Estrogen cream 3 times a week.   Pt said that she is currently using the Aricept patch and questions whether or not this may be causing the sxs that she is experiencing.   Pt denies any f/s/c/n/v/d or blood in urine.   I advised her that she may need to come in and give a urine sample will call her back and let her know next steps.

## 2022-07-18 NOTE — Telephone Encounter (Signed)
Needs appt. She wasn't having active problems when I saw her and this doesn't sound like a UTI.

## 2022-07-18 NOTE — Telephone Encounter (Signed)
Pt stated that her sxs have subsided as quickly as

## 2022-07-19 ENCOUNTER — Encounter: Payer: Self-pay | Admitting: Family Medicine

## 2022-07-19 ENCOUNTER — Ambulatory Visit (INDEPENDENT_AMBULATORY_CARE_PROVIDER_SITE_OTHER): Payer: Medicare Other | Admitting: Family Medicine

## 2022-07-19 VITALS — BP 133/62 | HR 74 | Ht 61.0 in | Wt 149.0 lb

## 2022-07-19 DIAGNOSIS — J329 Chronic sinusitis, unspecified: Secondary | ICD-10-CM | POA: Diagnosis not present

## 2022-07-19 DIAGNOSIS — I739 Peripheral vascular disease, unspecified: Secondary | ICD-10-CM

## 2022-07-19 DIAGNOSIS — R350 Frequency of micturition: Secondary | ICD-10-CM

## 2022-07-19 DIAGNOSIS — G5793 Unspecified mononeuropathy of bilateral lower limbs: Secondary | ICD-10-CM | POA: Diagnosis not present

## 2022-07-19 DIAGNOSIS — L03019 Cellulitis of unspecified finger: Secondary | ICD-10-CM

## 2022-07-19 LAB — POCT URINALYSIS DIP (CLINITEK)
Bilirubin, UA: NEGATIVE
Blood, UA: NEGATIVE
Glucose, UA: NEGATIVE mg/dL
Leukocytes, UA: NEGATIVE
Nitrite, UA: NEGATIVE
POC PROTEIN,UA: NEGATIVE
Spec Grav, UA: 1.015 (ref 1.010–1.025)
Urobilinogen, UA: 0.2 E.U./dL
pH, UA: 6.5 (ref 5.0–8.0)

## 2022-07-19 MED ORDER — MUPIROCIN 2 % EX OINT: TOPICAL_OINTMENT | CUTANEOUS | 0 refills | Status: DC

## 2022-07-19 MED ORDER — NYSTATIN-TRIAMCINOLONE 100000-0.1 UNIT/GM-% EX OINT: 1.0000 | TOPICAL_OINTMENT | Freq: Two times a day (BID) | CUTANEOUS | 0 refills | Status: DC

## 2022-07-19 NOTE — Progress Notes (Signed)
Acute Office Visit  Subjective:     Patient ID: Carol Perez, female    DOB: [birth year], 82 y.o.   MRN: [id number]  Chief Complaint  Patient presents with   Recurrent UTI    HPI Patient is in today for acute visit.  That she has several concerns today.  Initially she had called because she was having some right-sided low back pain.  It felt sharp and intermittent.  She thought maybe it could be UTI or possibly a kidney stone she never had any hematuria.  By the time she made the appointment it actually started to ease off on its own.  But since then has noticed some irritation in the groin area just anterior to the urethra.  She has been using topical estrogen cream 3 times a week and wondered if it could actually be causing some irritation.  No dysuria.  Finger issues -she said she also has been dealing with an infection on the side of her finger she said the cuticle tore and it became very swollen and most appeared white looking.  She started doing some salt soaks and using some over-the-counter creams and it did get better but is just not completely healed.  Reports numbness in both feet and cramping in feet. Worse with exercise.  She did start a supplement for neuropathy which is mostly a B complex with some additional zinc and folate.  She is been on it for about 2 months and feels like maybe it has helped some.    Also having issues with chronic right-sided sinusitis that she has been dealing with on and off for 2 years.  It persistently drains.  In fact she went to the ED at the end of December and was treated at that time but is continuing to have persistent symptoms.  Is never been seen by ENT for this in the last couple years though she did have a deviated septum repair a few years ago.  ROS      Objective:    BP 133/62   Pulse 74   Ht '5\' 1"'$  (1.549 m)   Wt 149 lb (67.6 kg)   SpO2 98%   BMI 28.15 kg/m    Physical Exam Vitals reviewed.  Constitutional:       Appearance: She is well-developed.  HENT:     Head: Normocephalic and atraumatic.  Eyes:     Conjunctiva/sclera: Conjunctivae normal.  Cardiovascular:     Rate and Rhythm: Normal rate.  Pulmonary:     Effort: Pulmonary effort is normal.  Genitourinary:      Comments: Tissue apears dry but no scale.  There is a white oval skin lesion sthat is very tender . No ative drainage.  Musculoskeletal:     Comments: Along the edge of the finger there is a small scab with some mild swelling. No erythema. No recent active drainage.    Skin:    General: Skin is dry.     Coloration: Skin is not pale.  Neurological:     Mental Status: She is alert and oriented to person, place, and time.  Psychiatric:        Behavior: Behavior normal.     Results for orders placed or performed in visit on 07/19/22  POCT URINALYSIS DIP (CLINITEK)  Result Value Ref Range   Color, UA yellow yellow   Clarity, UA clear clear   Glucose, UA negative negative mg/dL   Bilirubin, UA negative negative   Ketones, POC  UA trace (5) (A) negative mg/dL   Spec Grav, UA 1.015 1.010 - 1.025   Blood, UA negative negative   pH, UA 6.5 5.0 - 8.0   POC PROTEIN,UA negative negative, trace   Urobilinogen, UA 0.2 0.2 or 1.0 E.U./dL   Nitrite, UA Negative Negative   Leukocytes, UA Negative Negative        Assessment & Plan:   Problem List Items Addressed This Visit       Nervous and Auditory   Neuropathy of both feet    Please see previous note.  She is currently taking a B supplement.  And using some topical creams.  She wonders if it could be related to her spinal surgery she actually does have hardware in her lumbar spine.  She had surgery a couple of years ago the numbness and cramping started maybe a year ago but it has been more frequent over the last several months.  She also notices worsening cramping and discomfort with exercise.  So we discussed further evaluation for claudication and if workup is negative then  consider referral to neurology for further evaluation discussed potential causes for neuropathy.      Other Visit Diagnoses     Urinary frequency    -  Primary   Relevant Orders   POCT URINALYSIS DIP (CLINITEK) (Completed)   VAS [place] ABI WITH/WO TBI   Chronic sinusitis, unspecified location       Relevant Orders   Ambulatory referral to ENT   Intermittent claudication ([place])       Relevant Orders   VAS [place] ABI WITH/WO TBI   Paronychia of finger, unspecified laterality       Relevant Medications   nystatin-triamcinolone ointment (MYCOLOG)   mupirocin ointment (BACTROBAN) 2 %      Intermittent claudication she is also describing claudication symptoms so I do not think she needs to be evaluated for peripheral vascular disease.  This may not explain the numbness.  Can consider further workup for neuropathy after we get ABIs.  Chronic sinusitis it sounds like this was been going on for couple of years and does not seem to respond well to antibiotics so we discussed referral to ENT there are other causes such as anatomy, polyps, chronic sinusitis all of which will need to be evaluated for further.  Lesion in the groin just anterior to the urethra-unclear etiology at this point.  It almost has the appearance of an ulcer.  Will treat with Mycolog.  If not improving over the next week then please let us know.  I really do not think this is related to the estrogen cream.  Paronychia-will do a trial of mupirocin it sounds like it is actually much better than it was with just doing the salt soaks.  If it does not continue to get better over the next week please let me know I did not see any significant induration or anything that would benefit from drainage at this point.  Being that it is lower down on the nail edge I do not suspect an ingrown nail at this point.  Avoid excessive/soaking of the finger moisture.  Meds ordered this encounter  Medications   nystatin-triamcinolone ointment (MYCOLOG)     Sig: Apply 1 Application topically 2 (two) times daily. Apply to affected area in groin    Dispense:  15 g    Refill:  0   mupirocin ointment (BACTROBAN) 2 %    Sig: Apply to finger BID x 10 days  Dispense:  30 g    Refill:  0      No follow-ups on file.  Beatrice Lecher, MD

## 2022-07-19 NOTE — Progress Notes (Signed)
Pt reports that she was experiencing some urinary frequency. She felt that this was due to her Aricept patch that she was using.   She stated that the R sided pain has since subsided but she is now having some burning at her urethra

## 2022-07-19 NOTE — Assessment & Plan Note (Signed)
Please see previous note.  She is currently taking a B supplement.  And using some topical creams.  She wonders if it could be related to her spinal surgery she actually does have hardware in her lumbar spine.  She had surgery a couple of years ago the numbness and cramping started maybe a year ago but it has been more frequent over the last several months.  She also notices worsening cramping and discomfort with exercise.  So we discussed further evaluation for claudication and if workup is negative then consider referral to neurology for further evaluation discussed potential causes for neuropathy.

## 2022-07-24 ENCOUNTER — Encounter (HOSPITAL_COMMUNITY)
Admission: RE | Admit: 2022-07-24 | Discharge: 2022-07-24 | Disposition: A | Discharge status: Home / Self Care | Payer: Medicare Other | Source: Ambulatory Visit | Attending: Neurology | Admitting: Neurology

## 2022-07-24 DIAGNOSIS — G309 Alzheimer's disease, unspecified: Secondary | ICD-10-CM | POA: Insufficient documentation

## 2022-07-24 DIAGNOSIS — Z0389 Encounter for observation for other suspected diseases and conditions ruled out: Secondary | ICD-10-CM | POA: Diagnosis not present

## 2022-07-24 DIAGNOSIS — F0280 Dementia in other diseases classified elsewhere without behavioral disturbance: Secondary | ICD-10-CM | POA: Diagnosis not present

## 2022-07-24 DIAGNOSIS — G3184 Mild cognitive impairment, so stated: Secondary | ICD-10-CM | POA: Diagnosis present

## 2022-07-24 MED ORDER — FLORBETAPIR F 18 500-1900 MBQ/ML IV SOLN
10.5000 | Freq: Once | INTRAVENOUS | Status: AC
  Administered 2022-07-24: 10.5 via INTRAVENOUS

## 2022-07-25 ENCOUNTER — Telehealth: Payer: Self-pay | Admitting: *Deleted

## 2022-07-25 NOTE — Telephone Encounter (Signed)
Pt called stating that someone called her and lvm about an appt?  Looking back in her chart there were no notes about this. She then mentioned that Dr. Madilyn Fireman had mentioned that she was placing a referral for her to see a neurologist but she had told Dr. Madilyn Fireman that she already has a Neurologist. She also mentioned that she was going to refer her to an ENT.   Pt given the following information:   [facility] [address] [place], [place] [address] [phone]

## 2022-07-26 ENCOUNTER — Telehealth: Payer: Self-pay | Admitting: Neurology

## 2022-07-26 DIAGNOSIS — H04123 Dry eye syndrome of bilateral lacrimal glands: Secondary | ICD-10-CM | POA: Diagnosis not present

## 2022-07-26 DIAGNOSIS — G309 Alzheimer's disease, unspecified: Secondary | ICD-10-CM

## 2022-07-26 MED ORDER — ADLARITY 5 MG/DAY TD PTWK: 5.0000 mg | MEDICATED_PATCH | TRANSDERMAL | 5 refills | Status: DC

## 2022-07-26 NOTE — Telephone Encounter (Signed)
Pt called stating that she is now on her 4th Donepezil HCl (ADLARITY) 5 MG/DAY PTWK and is wanting to know if she will be called soon as per last conversation with provider. Please advise.

## 2022-07-26 NOTE — Telephone Encounter (Signed)
I approved it.

## 2022-07-27 ENCOUNTER — Telehealth: Payer: Self-pay | Admitting: *Deleted

## 2022-07-27 NOTE — Telephone Encounter (Signed)
Spoke to patient gave PET scan results Pt made a f/u  appointment with Dr Jaynee Eagles to discuss results  further Pt thanked me for calling

## 2022-07-27 NOTE — Telephone Encounter (Signed)
-----   Message from Melvenia Beam, MD sent at 07/27/2022 12:13 PM EST ----- Pod 4: The PET Scan did show amyloid in the brain which is the protein consistet with alzheimer's. If patient is considering lecanemab she can come back for appointment to discuss with me or NP or a video appointment, she is aware and we discussed at last appointment thanks

## 2022-08-04 DIAGNOSIS — H26491 Other secondary cataract, right eye: Secondary | ICD-10-CM | POA: Diagnosis not present

## 2022-08-04 DIAGNOSIS — H04123 Dry eye syndrome of bilateral lacrimal glands: Secondary | ICD-10-CM | POA: Diagnosis not present

## 2022-08-04 DIAGNOSIS — H43813 Vitreous degeneration, bilateral: Secondary | ICD-10-CM | POA: Diagnosis not present

## 2022-08-04 DIAGNOSIS — Z961 Presence of intraocular lens: Secondary | ICD-10-CM | POA: Diagnosis not present

## 2022-08-07 ENCOUNTER — Ambulatory Visit (HOSPITAL_COMMUNITY)
Admission: RE | Admit: 2022-08-07 | Discharge: 2022-08-07 | Disposition: A | Discharge status: Home / Self Care | Payer: Medicare Other | Source: Ambulatory Visit | Attending: Cardiology | Admitting: Cardiology

## 2022-08-07 ENCOUNTER — Telehealth: Payer: Self-pay | Admitting: Neurology

## 2022-08-07 ENCOUNTER — Encounter: Payer: Self-pay | Admitting: Neurology

## 2022-08-07 DIAGNOSIS — I739 Peripheral vascular disease, unspecified: Secondary | ICD-10-CM | POA: Diagnosis not present

## 2022-08-07 DIAGNOSIS — R350 Frequency of micturition: Secondary | ICD-10-CM | POA: Diagnosis not present

## 2022-08-07 NOTE — Telephone Encounter (Signed)
Pt is calling stated she took of her memory patch because her arm was burning and itching. Stated her arm is a big red blog and it's hot. She is requesting a call back from nurse.

## 2022-08-07 NOTE — Telephone Encounter (Signed)
I called pt she has reaction (allregic reaction) to the Adarlity patch.  She finished the samples which she had no problem, then the precription she noted having itching/burning sensation since she placed on Monday, but then got to be so bad that she took off Saturday.  Cortisone 10 and benadryl it is better.  She sent picture of in mychart.  She is alternating arms. I told her to stop until I see what Dr. Jaynee Eagles wants to do.  Pt has asked about going back on orals.  Please advise.

## 2022-08-07 NOTE — Telephone Encounter (Signed)
I would stop for a few week and we can discuss at follow up thanks

## 2022-08-08 LAB — VAS US ABI WITH/WO TBI
Left ABI: 1.2
Right ABI: 1.18

## 2022-08-08 NOTE — Telephone Encounter (Signed)
Spoke with patient. She is agreeable to plan. She asked what else she could put on the rash to help as it burns. She is going to consult with her pharmacist. She is currently using cortisone and taking benadryl at night.

## 2022-08-08 NOTE — Telephone Encounter (Signed)
Spoke with patient. She states the pharmacist looked at it and said it may be infected and she may need antibiotic too. Patient states she already made an appointment with Dr Madilyn Fireman for tomorrow morning. She thanked me for the call.

## 2022-08-08 NOTE — Telephone Encounter (Signed)
How about 3 days of '20mg'$  of steroids if she is ok with it and so is her pcp  ([facility] pcp - adarity gave her a bad rash)

## 2022-08-08 NOTE — Telephone Encounter (Signed)
I am Ok with her having steroids.

## 2022-08-09 ENCOUNTER — Telehealth: Payer: Self-pay

## 2022-08-09 ENCOUNTER — Ambulatory Visit (INDEPENDENT_AMBULATORY_CARE_PROVIDER_SITE_OTHER): Payer: Medicare Other | Admitting: Family Medicine

## 2022-08-09 ENCOUNTER — Other Ambulatory Visit: Payer: Self-pay

## 2022-08-09 ENCOUNTER — Encounter: Payer: Self-pay | Admitting: Family Medicine

## 2022-08-09 VITALS — BP 136/60 | HR 70 | Ht 61.0 in | Wt 144.0 lb

## 2022-08-09 DIAGNOSIS — L231 Allergic contact dermatitis due to adhesives: Secondary | ICD-10-CM

## 2022-08-09 DIAGNOSIS — M810 Age-related osteoporosis without current pathological fracture: Secondary | ICD-10-CM

## 2022-08-09 DIAGNOSIS — G5793 Unspecified mononeuropathy of bilateral lower limbs: Secondary | ICD-10-CM | POA: Diagnosis not present

## 2022-08-09 MED ORDER — PREDNISONE 20 MG PO TABS: 20.0000 mg | ORAL_TABLET | Freq: Every day | ORAL | 0 refills | Status: DC

## 2022-08-09 NOTE — Progress Notes (Signed)
Hi Carol Perez, great news!  Blood flow in both the legs down to your toes looks normal.  No sign of peripheral vascular disease which is great.

## 2022-08-09 NOTE — Telephone Encounter (Signed)
Please do a prior authorization for the Prolia.

## 2022-08-09 NOTE — Assessment & Plan Note (Addendum)
Normal ABIs to rule out significant PVD.  It looks like in the past she has been on treatments for restless leg but reassured her that restless leg is not typically painful to the degree that she is experiencing.  I think this is more consistent with neuropathy.  Consider other causes such as spinal stenosis as well especially since her pain is often worse with activity.  Since she is already connected with Dr. Lavell Anchors I will send a updated referral requesting specifically that we address the neuropathy.  She actually has a previous prescription of pregabalin and did encourage her to try restarting it at 1 tablet nightly for the next few weeks to see if she feels like it is helpful or not.  We did discuss that she is on the starter dose so if tolerated well she might benefit from a bump up in her dosing.

## 2022-08-09 NOTE — Progress Notes (Signed)
Established Patient Office Visit  Subjective   Patient ID: Carol Perez, female    DOB: [birth year]  Age: 82 y.o. MRN: [id number]  Chief Complaint  Patient presents with   Allergic Reaction    HPI  She is here today for a rash on her right upper outer arm from her old OT patch.  She said the first couple patches use she did well after the third patch she noticed a little bit irritation of the skin and the by the time she filled the fourth patch she had significant itching irritation and erythema.  No blisters.  She took it off about 4 days ago and has been using a topical steroid cream on it.  She is also been taking some oral Benadryl at night to help with the itching.  She also wanted to follow-up on recent ABIs.  She had reported symptoms that were consistent with peripheral neuropathy but also claudication as her pain was exacerbated with walking.  So we discussed getting ABIs just to rule out peripheral vascular disease.  Those came back normal.    ROS    Objective:     BP 136/60   Pulse 70   Ht '5\' 1"'$  (1.549 m)   Wt 144 lb (65.3 kg)   SpO2 98%   BMI 27.21 kg/m    Physical Exam Vitals and nursing note reviewed.  Constitutional:      Appearance: She is well-developed.  HENT:     Head: Normocephalic and atraumatic.  Cardiovascular:     Rate and Rhythm: Normal rate and regular rhythm.     Heart sounds: Normal heart sounds.  Pulmonary:     Effort: Pulmonary effort is normal.     Breath sounds: Normal breath sounds.  Skin:    General: Skin is warm and dry.     Comments: Patch of erythema on the right upper outer shoulder that is in a perfect square.  There are a few excoriations but no vesicles.  Neurological:     Mental Status: She is alert and oriented to person, place, and time.  Psychiatric:        Behavior: Behavior normal.      No results found for any visits on 08/09/22.    The 10-year ASCVD risk score (Arnett DK, et al., 2019) is: 28.1%     Assessment & Plan:   Problem List Items Addressed This Visit       Nervous and Auditory   Neuropathy of both feet - Primary    Normal ABIs to rule out significant PVD.  It looks like in the past she has been on treatments for restless leg but reassured her that restless leg is not typically painful to the degree that she is experiencing.  I think this is more consistent with neuropathy.  Consider other causes such as spinal stenosis as well especially since her pain is often worse with activity.  Since she is already connected with Dr. Lavell Anchors I will send a updated referral requesting specifically that we address the neuropathy.  She actually has a previous prescription of pregabalin and did encourage her to try restarting it at 1 tablet nightly for the next few weeks to see if she feels like it is helpful or not.  We did discuss that she is on the starter dose so if tolerated well she might benefit from a bump up in her dosing.      Relevant Medications   pregabalin (LYRICA) 25 MG capsule  Other Relevant Orders   Ambulatory referral to Neurology   Other Visit Diagnoses     Allergic contact dermatitis due to adhesives          Contact dermatitis we discussed options she can always continue with just the topical hydrocortisone cream and it will eventually go away it would likely take another week or 2.  But if she would like to try the prednisone I think that is absolutely reasonable at a low dose just for a few days.  Prescription sent to pharmacy.  Recommend ice and antihistamine as needed as well.  No follow-ups on file.    Beatrice Lecher, MD

## 2022-08-15 ENCOUNTER — Telehealth: Payer: Self-pay

## 2022-08-15 NOTE — Telephone Encounter (Signed)
Initiated Prior authorization [id number] '60MG'$ /ML syringes Via: Covermymeds Case/Key: [id number]  Status: approved  as of 08/15/22 Reason:This medication or product is on your plan's list of covered drugs. Prior authorization is not required at this time The cost breakdown of this medication is 20% co-pay of $291, Notified Pt via: Mychart

## 2022-08-22 DIAGNOSIS — J300 Vasomotor rhinitis: Secondary | ICD-10-CM | POA: Diagnosis not present

## 2022-08-22 DIAGNOSIS — J324 Chronic pansinusitis: Secondary | ICD-10-CM | POA: Diagnosis not present

## 2022-08-23 NOTE — Telephone Encounter (Signed)
See MyChart message

## 2022-08-24 ENCOUNTER — Emergency Department (HOSPITAL_BASED_OUTPATIENT_CLINIC_OR_DEPARTMENT_OTHER)
Admission: EM | Admit: 2022-08-24 | Discharge: 2022-08-24 | Payer: Medicare Other | Attending: Emergency Medicine | Admitting: Emergency Medicine

## 2022-08-24 ENCOUNTER — Other Ambulatory Visit: Payer: Self-pay

## 2022-08-24 ENCOUNTER — Encounter (HOSPITAL_BASED_OUTPATIENT_CLINIC_OR_DEPARTMENT_OTHER): Payer: Self-pay | Admitting: Pediatrics

## 2022-08-24 DIAGNOSIS — R410 Disorientation, unspecified: Secondary | ICD-10-CM | POA: Insufficient documentation

## 2022-08-24 DIAGNOSIS — Z91040 Latex allergy status: Secondary | ICD-10-CM | POA: Insufficient documentation

## 2022-08-24 DIAGNOSIS — R4182 Altered mental status, unspecified: Secondary | ICD-10-CM | POA: Diagnosis present

## 2022-08-24 DIAGNOSIS — R9431 Abnormal electrocardiogram [ECG] [EKG]: Secondary | ICD-10-CM | POA: Diagnosis not present

## 2022-08-24 DIAGNOSIS — Z7982 Long term (current) use of aspirin: Secondary | ICD-10-CM | POA: Diagnosis not present

## 2022-08-24 LAB — RAPID URINE DRUG SCREEN, HOSP PERFORMED
Amphetamines: NOT DETECTED
Barbiturates: NOT DETECTED
Benzodiazepines: NOT DETECTED
Cocaine: NOT DETECTED
Opiates: NOT DETECTED
Tetrahydrocannabinol: NOT DETECTED

## 2022-08-24 LAB — COMPREHENSIVE METABOLIC PANEL
ALT: 19 U/L (ref 0–44)
AST: 27 U/L (ref 15–41)
Albumin: 3.9 g/dL (ref 3.5–5.0)
Alkaline Phosphatase: 68 U/L (ref 38–126)
Anion gap: 6 (ref 5–15)
BUN: 16 mg/dL (ref 8–23)
CO2: 27 mmol/L (ref 22–32)
Calcium: 8.9 mg/dL (ref 8.9–10.3)
Chloride: 100 mmol/L (ref 98–111)
Creatinine, Ser: 1.07 mg/dL — ABNORMAL HIGH (ref 0.44–1.00)
GFR, Estimated: 53 mL/min — ABNORMAL LOW (ref >60)
Glucose, Bld: 119 mg/dL — ABNORMAL HIGH (ref 70–99)
Potassium: 4.2 mmol/L (ref 3.5–5.1)
Sodium: 133 mmol/L — ABNORMAL LOW (ref 135–145)
Total Bilirubin: 0.7 mg/dL (ref 0.3–1.2)
Total Protein: 6.6 g/dL (ref 6.5–8.1)

## 2022-08-24 LAB — URINALYSIS, ROUTINE W REFLEX MICROSCOPIC
Bilirubin Urine: NEGATIVE
Glucose, UA: NEGATIVE mg/dL
Hgb urine dipstick: NEGATIVE
Ketones, ur: NEGATIVE mg/dL
Nitrite: NEGATIVE
Protein, ur: NEGATIVE mg/dL
Specific Gravity, Urine: 1.015 (ref 1.005–1.030)
pH: 7 (ref 5.0–8.0)

## 2022-08-24 LAB — CBG MONITORING, ED: Glucose-Capillary: 109 mg/dL — ABNORMAL HIGH (ref 70–99)

## 2022-08-24 LAB — CBC
HCT: 34.5 % — ABNORMAL LOW (ref 36.0–46.0)
Hemoglobin: 11.4 g/dL — ABNORMAL LOW (ref 12.0–15.0)
MCH: 32.7 pg (ref 26.0–34.0)
MCHC: 33 g/dL (ref 30.0–36.0)
MCV: 98.9 fL (ref 80.0–100.0)
Platelets: 231 10*3/uL (ref 150–400)
RBC: 3.49 MIL/uL — ABNORMAL LOW (ref 3.87–5.11)
RDW: 13.4 % (ref 11.5–15.5)
WBC: 6.8 10*3/uL (ref 4.0–10.5)
nRBC: 0 % (ref 0.0–0.2)

## 2022-08-24 LAB — URINALYSIS, MICROSCOPIC (REFLEX)

## 2022-08-24 LAB — ETHANOL: Alcohol, Ethyl (B): <10 mg/dL (ref <10)

## 2022-08-24 NOTE — ED Notes (Signed)
Pt advised in October she had a TIA, diagnosed before a PET scan because she was confused when planning out her day, and was having periods of amnesia.   Today was at a pharmacy around 1115hrs. Sudden onset of confusion in the PVA when walking back out to her car. She went back inside and the pharmacist called her husband for her. He arrived soon after and the pt advised she didn't know how to drive home (directionally challenged). The pt advsied she could follow her husband in the car, and did so, and began to remember how to get home when she got closer. Confusion last until 1215hrs or so. Resolved before arriving at the ED by POV with her husband.   No current CVA symptoms. Hx of neuropathy in lower extremities. Hx of UTI, and concerned for same.

## 2022-08-24 NOTE — ED Triage Notes (Signed)
Reports hx of TIA last October and returned to baseline. Concern for another episode this morning due to confusion and forgot how to get back home. Stated she was B-atrovent last night and could not sleep. C/o shakiness as well. Reports increased urinary freq the last 2 days. Denies pain when asked. A&Ox4. Denies unilateral deficits, -droop, -numbness, - weakness.

## 2022-08-24 NOTE — ED Notes (Signed)
Discharge paperwork reviewed entirely with patient, including Rx's and follow up care. Pain was under control. Pt verbalized understanding as well as all parties involved. No questions or concerns voiced at the time of discharge. No acute distress noted.   Pt ambulated out to PVA without incident or assistance.  

## 2022-08-24 NOTE — ED Provider Notes (Signed)
[facility] Provider Note   CSN: [id number] Arrival date & time: 08/24/22  1223     History  Chief Complaint  Patient presents with   Altered Mental Status    Carol Perez is a 82 y.o. female with HLD, IBS, recurrent UTI, Barrett's esophagus, mild cognitive impairment, hyponatremia, GERD who presents with episode of altered mental status.  Today was at a pharmacy around 1115hrs. Sudden onset of confusion in the PVA when walking back out to her car. She went back inside and the pharmacist called her husband for her. He arrived soon after and the pt advised she didn't know how to drive home (directionally challenged). The pt advsied she could follow her husband in the car, and did so, and began to remember how to get home when she got closer. Confusion last until 1215hrs or so. Resolved before arriving at the ED by POV with her husband.   Per chart review, patient had similar episode of confusion in 03/2022 ultimately attributed to a UTI. Saw neurology for f/u in 05/2022, there is concern for progressive dementia or cognitive decline, patient has had similar episodes of memory loss and confusion that she is managing chronically with neurology as an outpatient, she recently had a PET amyloid scan that demonstrated amyloid on the brain and they are following up with neurology next week to discuss results.  Patient denies any numbness tingling, asymmetric weakness, trouble speaking or slurring words.  She states she remembers the entire event, did not have repetitive questioning at that time.  Denies any fevers or chills, cough, shortness of breath, nausea any diarrhea constipation, urinary symptoms, lower extremity edema, falls, head trauma, visual changes.  Altered Mental Status      Home Medications Prior to Admission medications   Medication Sig Start Date End Date Taking? Authorizing Provider  aspirin EC 81 MG tablet Take 81 mg by mouth 3  (three) times daily.    [provider]  atorvastatin (LIPITOR) 10 MG tablet TAKE ONE (1) TABLET BY MOUTH EACH DAY 06/28/22   Wardell Honour, MD  AZO-CRANBERRY PO Take 1 capsule by mouth as needed (UTI Symptoms).    [provider]  Bismuth Subsalicylate (PEPTO-BISMOL PO) Take by mouth as needed.    [provider]  buPROPion (WELLBUTRIN XL) 150 MG 24 hr tablet TAKE ONE (1) TABLET BY MOUTH EVERY DAY 06/28/22   Ngetich, Dinah C, NP  CALCIUM PO Take 1,200 mg by mouth daily.     [provider]  Cholecalciferol (VITAMIN D3) 50 MCG (2000 UT) TABS Take 2,000 Units by mouth in the morning, at noon, and at bedtime.    [provider]  conjugated estrogens (PREMARIN) vaginal cream Place 1 applicator vaginally once a week.    [provider]  denosumab (PROLIA) 60 MG/ML SOSY injection Inject 60 mg into the skin every 6 (six) months.    [provider]  Diclofenac Sodium (MOTRIN ARTHRITIS PAIN EX) Apply topically.    [provider]  Donepezil HCl (ADLARITY) 5 MG/DAY PTWK Place 5 mg onto the skin once a week. 07/26/22   Melvenia Beam, MD  Glucosamine HCl 1500 MG TABS Take 1,500 mg by mouth daily.    [provider]  Homeopathic Products ([place] [facility]) Apply to eye.    [provider]  memantine (NAMENDA) 10 MG tablet Start with one pill at bedtime and in 1 -2 weeks if no side  effects increase to twice daily 05/15/22   Melvenia Beam, MD  mesalamine (LIALDA) 1.2 g EC tablet Take 1.2 g by mouth daily.    [provider]  methenamine (HIPREX) 1 g tablet Take 1 g by mouth 2 (two) times daily with a meal. For UTI Prevention. Last UTI: October 20th, November 5th, and December 28th.    [provider]  Multiple Vitamin (MULTIVITAMIN WITH MINERALS) TABS tablet Take 1 tablet by mouth daily. One A Day for Women    [provider]  mupirocin ointment (BACTROBAN) 2 % Apply to finger BID  x 10 days 07/19/22   [name], MD  nystatin-triamcinolone ointment [facility]) Apply 1 Application topically 2 (two) times daily. Apply to affected area in groin 07/19/22   [name], MD  Omeprazole-Sodium Bicarbonate (ZEGERID) 20-1100 MG CAPS capsule Take 1 capsule by mouth 2 (two) times daily.    [provider]  OVER THE COUNTER MEDICATION Place 1 drop into both ears as needed. Grapefruit Seed Extract Oil    [provider]  Polyethyl Glycol-Propyl Glycol (SYSTANE) 0.4-0.3 % SOLN Place 1 drop into both eyes as needed (dry/irritated eyes).    [provider]  predniSONE (DELTASONE) 20 MG tablet Take 1 tablet (20 mg total) by mouth daily with breakfast. 08/09/22   [name], MD  pregabalin (LYRICA) 25 MG capsule Take 25 mg by mouth at bedtime.    [provider]  Probiotic Product (PROBIOTIC COLON SUPPORT PO) Take 1 tablet by mouth in the morning and at bedtime.    [provider]  psyllium (METAMUCIL) 58.6 % powder Take 1 packet by mouth 3 (three) times daily.    [provider]  valACYclovir (VALTREX) 500 MG tablet Take 1 tablet (500 mg total) by mouth daily as needed. 03/30/22   Wardell Honour, MD      Allergies    Banana, Latex, Ciprofloxacin hcl, Gatifloxacin, Levofloxacin, Moxifloxacin, Norfloxacin, Nsaids, Ofloxacin, Other, Penicillins, Donepezil, Methylisothiazolinone, and Tape    Review of Systems   Review of Systems Review of systems Negative for f/c.  A 10 point review of systems was performed and is negative unless otherwise reported in HPI.  Physical Exam Updated Vital Signs BP (!) 145/69   Pulse 72   Temp 98.5 F (36.9 C) (Oral)   Resp 20   Ht '5\' 1"'$  (1.549 m)   Wt 65.3 kg   SpO2 100%   BMI 27.21 kg/m  Physical Exam General: Normal appearing female, lying in bed.  HEENT: PERRLA, EOMI, no nystagmus, Sclera anicteric, MMM, trachea midline.  Cardiology: RRR, no murmurs/rubs/gallops. BL  radial and DP pulses equal bilaterally.  Resp: Normal respiratory rate and effort. CTAB, no wheezes, rhonchi, crackles.  Abd: Soft, non-tender, non-distended. No rebound tenderness or guarding.  GU: Deferred. MSK: No peripheral edema or signs of trauma. Extremities without deformity or TTP. No cyanosis or clubbing. Skin: warm, dry. No rashes or lesions. Back: No CVA tenderness Neuro: A&Ox4, CNs II-XII grossly intact. 5/5 strength all extremities. Sensation grossly intact. Normal speech. Tongue protrudes midline. Normal coordination. Psych: Normal mood and affect.   1a  Level of consciousness: 0=alert; keenly responsive  1b. LOC questions:  0=Performs both tasks correctly  1c. LOC commands: 0=Performs both tasks correctly  2.  Best Gaze: 0=normal  3.  Visual: 0=No visual loss  4. Facial Palsy: 0=Normal symmetric movement  5a.  Motor left arm: 0=No drift, limb holds 90 (or 45) degrees for full  10 seconds  5b.  Motor right arm: 0=No drift, limb holds 90 (or 45) degrees for full 10 seconds  6a. motor left leg: 0=No drift, limb holds 90 (or 45) degrees for full 10 seconds  6b  Motor right leg:  0=No drift, limb holds 90 (or 45) degrees for full 10 seconds  7. Limb Ataxia: 0=Absent  8.  Sensory: 0=Normal; no sensory loss  9. Best Language:  0=No aphasia, normal  10. Dysarthria: 0=Normal  11. Extinction and Inattention: 0=No abnormality   Total:   0        ED Results / Procedures / Treatments   Labs (all labs ordered are listed, but only abnormal results are displayed) Labs Reviewed  COMPREHENSIVE METABOLIC PANEL - Abnormal; Notable for the following components:      Result Value   Sodium 133 (*)    Glucose, Bld 119 (*)    Creatinine, Ser 1.07 (*)    GFR, Estimated 53 (*)    All other components within normal limits  CBC - Abnormal; Notable for the following components:   RBC 3.49 (*)    Hemoglobin 11.4 (*)    HCT 34.5 (*)    All other components within normal limits   URINALYSIS, ROUTINE W REFLEX MICROSCOPIC - Abnormal; Notable for the following components:   Leukocytes,Ua TRACE (*)    All other components within normal limits  URINALYSIS, MICROSCOPIC (REFLEX) - Abnormal; Notable for the following components:   Bacteria, UA RARE (*)    All other components within normal limits  CBG MONITORING, ED - Abnormal; Notable for the following components:   Glucose-Capillary 109 (*)    All other components within normal limits  ETHANOL  RAPID URINE DRUG SCREEN, HOSP PERFORMED    EKG EKG Interpretation  Date/Time:  Thursday August 24 2022 13:41:28 EDT Ventricular Rate:  77 PR Interval:  133 QRS Duration: 95 QT Interval:  387 QTC Calculation: 438 R Axis:   3 Text Interpretation: Sinus rhythm Confirmed by Cindee Lame [phone]) on 08/24/2022 2:12:49 PM  Radiology No results found.  Procedures Procedures    Medications Ordered in ED Medications - No data to display  ED Course/ Medical Decision Making/ A&P                          Medical Decision Making Amount and/or Complexity of Data Reviewed Labs: ordered. Decision-making details documented in ED Course.    This patient presents to the ED for concern of AMS/confusion, this involves an extensive number of treatment options, and is a complaint that carries with it a high risk of complications and morbidity.  I considered the following differential and admission for this acute, potentially life threatening condition.   MDM:    Ddx of acute altered mental status or confusion considered but not limited to: -Intracranial abnormalities including CVA or TIA -patient states her family practices and is diagnosed with a TIA with similar symptoms that occurred before.  While I understand that the symptoms of TIA CVA, she was evaluated by her neurologist who seems to believe that these types of symptoms, which per the documentation seem to have occurred more frequently than just since October 2023, or more  likely related to patient's progressive likely dementia, and patient relates that the PET amyloid scan did demonstrate evidence of amyloid in the brain.  Neurologist did not seem to think that her symptoms were consistent with a TIA and given her lack of other  neurologic findings during the episode and now, I have a low suspicion for acute intracranial cause of symptoms such as ICH, hydrocephalus, CVA or TIA.  Patient feels completely at her baseline now and her symptoms have resolved. -Infection such as UTI, given that a similar episode of confusion was blamed on UTI in the past obtain UA today.Patient has no symptoms to indicate upper respiratory infection or pneumonia. -Toxic ingestion such as anticholinergic toxicity -patient did take her first dose of Atrovent last night nasal spray which could have contributed to her symptoms today. -Electrolyte abnormalities or hyper/hypoglycemia -ACS or arrhythmia -no palpitations, chest pain, shortness of breath, very low concern -No repetitive questioning, no amnesia to event, no c/f TGA    Clinical Course as of 08/24/22 1430  Thu Aug 24, 2022  1328 Hemoglobin(!): 11.4 Down from 12.7 on 05/22/22. No melena/hematochezia reported, no abdominal pain on exam.  [HN]  1328 WBC: 6.8 No leukocytosis [HN]  1404 Urinalysis, Routine w reflex microscopic -Urine, Clean Catch(!) Neg for UTI [HN]  1405 Rapid urine drug screen (hospital performed) Neg [HN]  1405 Glucose-Capillary(!): 109 [HN]    Clinical Course User Index [HN] Audley Hose, MD    Labs: I Ordered, and personally interpreted labs.  The pertinent results include: Those listed above  Additional history obtained from chart review, husband at bedside.   Cardiac Monitoring: The patient was maintained on a cardiac monitor.  I personally viewed and interpreted the cardiac monitored which showed an underlying rhythm of: Normal sinus rhythm  Reevaluation: Patient's symptoms have  :resolved  Social Determinants of Health: Patient lives independently   Disposition: I discussed extensively with the patient and her husband at bedside.  I recommend discontinuing the Atrovent nasal spray as this may have contributed to her symptoms.  She also has recently stopped taking the Aricept as they are trying to find a dementia medication that patient can tolerate, as she has been intolerant of side effects in the past for several others.  This also may have contributed.  Per thorough review of patient's chart, it seems as though her neurologist has thought that symptoms consistent with the symptoms patient described today are due to mild cognitive impairment/decline and possible dementia and is currently undergoing outpatient workup for the same.  I do not believe the patient symptoms today represent any new or acute neurologic process she has no other focal neurodeficits and is currently now at her baseline.  She also does not have any UTI demonstrated on her UA and other workup is reassuring.  I discussed with patient the possibility of further imaging however patient and her husband agree that they would like to be discharged to follow with an outpatient.  They will call her primary care physician as well as a neurologist to follow-up next week.  They are given extensive discharge instruction return precautions, including to return to the emergency department the symptoms happen again in the future.  They report understanding.   Co morbidities that complicate the patient evaluation  Past Medical History:  Diagnosis Date   Barrett's esophagus    Colitis    DDD (degenerative disc disease)    Factor 5 Leiden mutation, heterozygous [facility])    Per [facility].   Frequent UTI    H/O echocardiogram 04/01/2019   Per [facility] Packet.   Hiatal hernia    Per [place] [facility] Patient Packet.   High grade dysplasia of Barrett's epithelium    Per [facility] New Patient  Packet.   History of  bladder infections    Per [facility] New Patient Packet.   History of Papanicolaou smear of cervix 10/16/2011   Per [place] Patient Packet   Hypoglycemia    Hypotension    Per [facility] Patient Packet.   IBS (irritable bowel syndrome)    Per [place] [facility] Packet.   Lactose intolerance    Osteoarthritis    Per [place] New Patient Packet.   Osteopenia    Per [place] [facility] Patient Packet.   Osteoporosis    Premature ventricular complex    Raynaud's disease    Per [place] Patient Packet.   Scoliosis    Per [facility] New Patient Packet.     Medicines No orders of the defined types were placed in this encounter.   I have reviewed the patients home medicines and have made adjustments as needed  Problem List / ED Course: Problem List Items Addressed This Visit   None Visit Diagnoses     Transient disorientation    -  Primary                   This note was created using dictation software, which may contain spelling or grammatical errors.    Audley Hose, MD 08/24/22 [phone]

## 2022-08-24 NOTE — Discharge Instructions (Signed)
Thank you for coming to [facility]. You were seen for transient disorientation, likely related to your chronic symptoms. We did an exam, labs, and these showed no acute findings.  Please follow up with your primary care provider and neurologist within 1 week.   Do not hesitate to return to the ED or call 911 if you experience: -Worsening symptoms -Lightheadedness, passing out -Fevers/chills -Anything else that concerns you

## 2022-08-24 NOTE — ED Notes (Addendum)
Ignore that departure condition, accidentally discharged incorrect chart from Epic.

## 2022-09-04 ENCOUNTER — Encounter: Payer: Self-pay | Admitting: Emergency Medicine

## 2022-09-04 ENCOUNTER — Ambulatory Visit
Admission: EM | Admit: 2022-09-04 | Discharge: 2022-09-04 | Disposition: A | Discharge status: Home / Self Care | Payer: Medicare Other | Attending: Family Medicine | Admitting: Family Medicine

## 2022-09-04 DIAGNOSIS — Z87440 Personal history of urinary (tract) infections: Secondary | ICD-10-CM | POA: Diagnosis not present

## 2022-09-04 DIAGNOSIS — Z87891 Personal history of nicotine dependence: Secondary | ICD-10-CM | POA: Diagnosis not present

## 2022-09-04 DIAGNOSIS — Z90710 Acquired absence of both cervix and uterus: Secondary | ICD-10-CM | POA: Diagnosis not present

## 2022-09-04 DIAGNOSIS — R350 Frequency of micturition: Secondary | ICD-10-CM | POA: Diagnosis not present

## 2022-09-04 DIAGNOSIS — I7300 Raynaud's syndrome without gangrene: Secondary | ICD-10-CM | POA: Diagnosis not present

## 2022-09-04 DIAGNOSIS — Z8719 Personal history of other diseases of the digestive system: Secondary | ICD-10-CM | POA: Diagnosis not present

## 2022-09-04 DIAGNOSIS — N390 Urinary tract infection, site not specified: Secondary | ICD-10-CM

## 2022-09-04 DIAGNOSIS — M549 Dorsalgia, unspecified: Secondary | ICD-10-CM | POA: Insufficient documentation

## 2022-09-04 DIAGNOSIS — D6851 Activated protein C resistance: Secondary | ICD-10-CM | POA: Diagnosis not present

## 2022-09-04 DIAGNOSIS — K589 Irritable bowel syndrome without diarrhea: Secondary | ICD-10-CM | POA: Insufficient documentation

## 2022-09-04 LAB — POCT URINALYSIS DIP (MANUAL ENTRY)
Bilirubin, UA: NEGATIVE
Blood, UA: NEGATIVE
Glucose, UA: NEGATIVE mg/dL
Ketones, POC UA: NEGATIVE mg/dL
Leukocytes, UA: NEGATIVE
Nitrite, UA: NEGATIVE
Protein Ur, POC: NEGATIVE mg/dL
Spec Grav, UA: 1.01 (ref 1.010–1.025)
Urobilinogen, UA: 0.2 E.U./dL
pH, UA: 5.5 (ref 5.0–8.0)

## 2022-09-04 MED ORDER — CEPHALEXIN 500 MG PO CAPS: 500.0000 mg | ORAL_CAPSULE | Freq: Two times a day (BID) | ORAL | 0 refills | Status: DC

## 2022-09-04 NOTE — Discharge Instructions (Signed)
Take the cephalexin antibiotic 2 times a day Drink lots of water  I am sending your urine for culture.  We will call you if any change in antibiotic is needed

## 2022-09-04 NOTE — ED Triage Notes (Signed)
Patient c/o possible bladder infection, having urinary frequency, back pain and headache today.  Patient did start on AZO today.

## 2022-09-04 NOTE — ED Provider Notes (Signed)
[facility]    CSN: [id number] Arrival date & time: 09/04/22  1841      History   Chief Complaint Chief Complaint  Patient presents with   Urinary Frequency    HPI JASPER VANGELDER is a 82 y.o. female.   HPI  Mrs. Edgeworth is here with symptoms of urinary tract infection.  She states that she has had back pain off and on for a couple of days.  She thought that it was from heavy lifting and rash.  She was using an ice pack.  She states that today she developed urinary frequency.  When she urinates she only goes a small amount.  She feels like she has to go again in a few minutes.  No real burning.  No fever or chills.  No abdominal pain or nausea.  The back pain is in the area of her lumbar spine.  No history of kidney stone or kidney infection  Past Medical History:  Diagnosis Date   Barrett's esophagus    Colitis    DDD (degenerative disc disease)    Factor 5 Leiden mutation, heterozygous [facility])    Per [place] [facility] Patient Packet.   Frequent UTI    H/O echocardiogram 04/01/2019   Per [place] Patient Packet.   Hiatal hernia    Per [place] [facility] Patient Packet.   High grade dysplasia of Barrett's epithelium    Per [facility] New Patient Packet.   History of bladder infections    Per [facility] New Patient Packet.   History of Papanicolaou smear of cervix 10/16/2011   Per [place] Patient Packet   Hypoglycemia    Hypotension    Per [facility] New Patient Packet.   IBS (irritable bowel syndrome)    Per [place] New Patient Packet.   Lactose intolerance    Osteoarthritis    Per [place] New Patient Packet.   Osteopenia    Per [place] New Patient Packet.   Osteoporosis    Premature ventricular complex    Raynaud's disease    Per [place] Patient Packet.   Scoliosis    Per [facility] New Patient Packet.    Patient Active Problem List   Diagnosis Date Noted   IFG (impaired fasting glucose) 07/10/2022   Neuropathy of both feet 07/10/2022   Dry eye 07/10/2022   MCI (mild cognitive impairment) 05/16/2022   FHx:  Alzheimer's disease 05/16/2022   Nocturnal leg cramps 11/23/2021   Overactive bladder 11/23/2021   Depression, recurrent ([place]) 08/24/2020   Atrophic vaginitis 08/24/2020   Blood loss anemia 08/24/2020   Hyponatremia    Lumbar scoliosis 08/12/2020   Occipital neuralgia of right side 09/08/2019   Recurrent UTI 06/26/2019   Osteoporosis 06/26/2019   IBS (irritable bowel syndrome) 06/26/2019   Hyperlipidemia 06/26/2019   Back pain 06/26/2019   Barrett's esophagus 06/26/2019   GERD (gastroesophageal reflux disease) 05/01/2019    Past Surgical History:  Procedure Laterality Date   ABDOMINAL EXPOSURE N/A 08/12/2020   Procedure: ABDOMINAL EXPOSURE;  Surgeon: Serafina Mitchell, MD;  Location: [place];  Service: Vascular;  Laterality: N/A;   ABDOMINAL HYSTERECTOMY  06/13/2011   By Dr.Scherer at [facility]. Per [facility] New Patient Packet.   ANTERIOR LAT LUMBAR FUSION Right 08/12/2020   Procedure: Right Lumbar two-three, Lumbar three-four, Lumbar four-five Anterolateral lumbar interbody fusion;  Surgeon: Erline Levine, MD;  Location: [place];  Service: Neurosurgery;  Laterality: Right;   ANTERIOR LUMBAR FUSION N/A 08/12/2020   Procedure: Lumbar five Sacral one  Anterior lumbar interbody fusion;  Surgeon: Erline Levine, MD;  Location: [place];  Service: Neurosurgery;  Laterality: N/A;   BREAST EXCISIONAL BIOPSY Left 30+ yrs ago   COLONOSCOPY  11/23/2011   By Dr.McCune at [facility]. Per [place] Patient Packet.   COLONOSCOPY  08/26/2019   LUMBAR PERCUTANEOUS PEDICLE SCREW 4 LEVEL N/A 08/12/2020   Procedure: Percutaneous pedicle screw fixation from Lumbar two to Sacral one ;  Surgeon: Erline Levine, MD;  Location: [place];  Service: Neurosurgery;  Laterality: N/A;   MAMMOGRAM  04/14/2019   pap smear  10/16/2011   SIGMOIDOSCOPY  11/02/2014   Per [place] New Patient Packet   TONSILLECTOMY  06/12/1946   Per [facility] New Patient Packet.    OB History   No obstetric history on file.      Home  Medications    Prior to Admission medications   Medication Sig Start Date End Date Taking? Authorizing Provider  aspirin EC 81 MG tablet Take 81 mg by mouth 3 (three) times daily.   Yes [provider]  atorvastatin (LIPITOR) 10 MG tablet TAKE ONE (1) TABLET BY MOUTH EACH DAY 06/28/22  Yes Wardell Honour, MD  Bismuth Subsalicylate (PEPTO-BISMOL PO) Take by mouth as needed.   Yes [provider]  buPROPion (WELLBUTRIN XL) 150 MG 24 hr tablet TAKE ONE (1) TABLET BY MOUTH EVERY DAY 06/28/22  Yes Ngetich, Dinah C, NP  CALCIUM PO Take 1,200 mg by mouth daily.    Yes [provider]  cephALEXin (KEFLEX) 500 MG capsule Take 1 capsule (500 mg total) by mouth 2 (two) times daily. 09/04/22  Yes Raylene Everts, MD  Cholecalciferol (VITAMIN D3) 50 MCG (2000 UT) TABS Take 2,000 Units by mouth in the morning, at noon, and at bedtime.   Yes [provider]  conjugated estrogens (PREMARIN) vaginal cream Place 1 applicator vaginally once a week.   Yes [provider]  denosumab (PROLIA) 60 MG/ML SOSY injection Inject 60 mg into the skin every 6 (six) months.   Yes [provider]  Diclofenac Sodium (MOTRIN ARTHRITIS PAIN EX) Apply topically.   Yes [provider]  Donepezil HCl (ADLARITY) 5 MG/DAY PTWK Place 5 mg onto the skin once a week. 07/26/22  Yes Melvenia Beam, MD  Glucosamine HCl 1500 MG TABS Take 1,500 mg by mouth daily.   Yes [provider]  Homeopathic Products ([place] OP) Apply to eye.   Yes [provider]  memantine (NAMENDA) 10 MG tablet Start with one pill at bedtime and in 1 -2 weeks if no side effects increase to twice daily 05/15/22  Yes Melvenia Beam, MD  mesalamine (LIALDA) 1.2 g EC tablet Take 1.2 g by mouth daily.   Yes [provider]  methenamine (HIPREX) 1 g tablet Take 1 g by mouth 2 (two) times daily with a meal. For UTI Prevention. Last UTI: October 20th, November 5th,  and December 28th.   Yes [provider]  Multiple Vitamin (MULTIVITAMIN WITH MINERALS) TABS tablet Take 1 tablet by mouth daily. One A Day for Women   Yes [provider]  mupirocin ointment (BACTROBAN) 2 % Apply to finger BID x 10 days 07/19/22  Yes [name], MD  nystatin-triamcinolone ointment [facility]) Apply 1 Application topically 2 (two) times daily. Apply to affected area in groin 07/19/22  Yes Metheney, Rene Kocher, MD  Omeprazole-Sodium Bicarbonate (ZEGERID) 20-1100 MG CAPS capsule Take 1 capsule by mouth  2 (two) times daily.   Yes [provider]  OVER THE COUNTER MEDICATION Place 1 drop into both ears as needed. Grapefruit Seed Extract Oil   Yes [provider]  Polyethyl Glycol-Propyl Glycol (SYSTANE) 0.4-0.3 % SOLN Place 1 drop into both eyes as needed (dry/irritated eyes).   Yes [provider]  pregabalin (LYRICA) 25 MG capsule Take 25 mg by mouth at bedtime.   Yes [provider]  Probiotic Product (PROBIOTIC COLON SUPPORT PO) Take 1 tablet by mouth in the morning and at bedtime.   Yes [provider]  psyllium (METAMUCIL) 58.6 % powder Take 1 packet by mouth 3 (three) times daily.   Yes [provider]  valACYclovir (VALTREX) 500 MG tablet Take 1 tablet (500 mg total) by mouth daily as needed. 03/30/22  Yes Wardell Honour, MD    Family History Family History  Problem Relation Age of Onset   Cancer Mother    Dementia Mother    Alzheimer's disease Mother    Cancer Father    Alzheimer's disease Maternal Grandmother    Dementia Maternal Grandmother    Alzheimer's disease Maternal Grandfather     Social History Social History   Tobacco Use   Smoking status: Former    Types: Cigarettes    Start date: 06/12/1977   Smokeless tobacco: Never   Tobacco comments:    42 years ago. Smoked from age [age]  Vaping Use   Vaping Use: Never used  Substance Use Topics   Alcohol use: Yes     Alcohol/week: 7.0 standard drinks of alcohol    Types: 7 Shots of liquor per week    Comment: one gin and tonic at night   Drug use: No     Allergies   Banana, Latex, Ciprofloxacin hcl, Gatifloxacin, Levofloxacin, Moxifloxacin, Norfloxacin, Nsaids, Ofloxacin, Other, Penicillins, Donepezil, Methylisothiazolinone, and Tape   Review of Systems Review of Systems See HPI  Physical Exam Triage Vital Signs ED Triage Vitals  Enc Vitals Group     BP 09/04/22 1852 (!) 151/77     Pulse Rate 09/04/22 1852 80     Resp 09/04/22 1852 16     Temp 09/04/22 1852 98.4 F (36.9 C)     Temp Source 09/04/22 1852 Oral     SpO2 09/04/22 1852 96 %     Weight 09/04/22 1853 142 lb (64.4 kg)     Height 09/04/22 1853 5\' 1"  (1.549 m)     Head Circumference --      Peak Flow --      Pain Score 09/04/22 1853 7     Pain Loc --      Pain Edu? --      Excl. in [place]? --    No data found.  Updated Vital Signs BP (!) 151/77 (BP Location: Right Arm)   Pulse 80   Temp 98.4 F (36.9 C) (Oral)   Resp 16   Ht 5\' 1"  (1.549 m)   Wt 64.4 kg   SpO2 96%   BMI 26.83 kg/m      Physical Exam Constitutional:      General: She is not in acute distress.    Appearance: Normal appearance. She is well-developed. She is not ill-appearing.  HENT:     Head: Normocephalic and atraumatic.  Eyes:     Conjunctiva/sclera: Conjunctivae normal.     Pupils: Pupils are equal, round, and reactive to light.  Cardiovascular:     Rate and Rhythm: Normal rate.  Pulmonary:     Effort: Pulmonary effort is normal. No respiratory distress.  Abdominal:     General: There is no distension.     Palpations: Abdomen is soft.     Tenderness: There is no abdominal tenderness. There is no right CVA tenderness or left CVA tenderness.  Musculoskeletal:        General: Normal range of motion.     Cervical back: Normal range of motion.  Skin:    General: Skin is warm and dry.  Neurological:     Mental Status: She is alert.       UC Treatments / Results  Labs (all labs ordered are listed, but only abnormal results are displayed) Labs Reviewed  URINE CULTURE  POCT URINALYSIS DIP (MANUAL ENTRY)    EKG   Radiology No results found.  Procedures Procedures (including critical care time)  Medications Ordered in UC Medications - No data to display  Initial Impression / Assessment and Plan / UC Course  I have reviewed the triage vital signs and the nursing notes.  Pertinent labs & imaging results that were available during my care of the patient were reviewed by me and considered in my medical decision making (see chart for details).     Patient has classic urinary tract infection symptoms but a normal urinalysis.  I explained to the patient that her UA was normal and she informed me that she had had infections all her life and she knew what they felt like.  I am not cover with antibiotics until the culture report.  She will follow-up with her primary care doctor. Patient has multiple antibiotic allergies and these are reviewed with her. Final Clinical Impressions(s) / UC Diagnoses   Final diagnoses:  Recurrent UTI  Urinary frequency     Discharge Instructions      Take the cephalexin antibiotic 2 times a day Drink lots of water  I am sending your urine for culture.  We will call you if any change in antibiotic is needed   ED Prescriptions     Medication Sig Dispense Auth. Provider   cephALEXin (KEFLEX) 500 MG capsule Take 1 capsule (500 mg total) by mouth 2 (two) times daily. 10 capsule Raylene Everts, MD      PDMP not reviewed this encounter.   Raylene Everts, MD 09/04/22 [phone]

## 2022-09-05 ENCOUNTER — Encounter: Payer: Self-pay | Admitting: Neurology

## 2022-09-05 ENCOUNTER — Ambulatory Visit: Payer: Medicare Other | Admitting: Neurology

## 2022-09-05 ENCOUNTER — Telehealth: Payer: Self-pay | Admitting: Neurology

## 2022-09-05 VITALS — BP 141/73 | HR 74 | Ht 61.0 in | Wt 147.2 lb

## 2022-09-05 DIAGNOSIS — G3184 Mild cognitive impairment, so stated: Secondary | ICD-10-CM | POA: Diagnosis not present

## 2022-09-05 DIAGNOSIS — R4189 Other symptoms and signs involving cognitive functions and awareness: Secondary | ICD-10-CM | POA: Diagnosis not present

## 2022-09-05 DIAGNOSIS — Z820 Family history of epilepsy and other diseases of the nervous system: Secondary | ICD-10-CM

## 2022-09-05 DIAGNOSIS — F0280 Dementia in other diseases classified elsewhere without behavioral disturbance: Secondary | ICD-10-CM

## 2022-09-05 DIAGNOSIS — G309 Alzheimer's disease, unspecified: Secondary | ICD-10-CM | POA: Diagnosis not present

## 2022-09-05 DIAGNOSIS — F03A0 Unspecified dementia, mild, without behavioral disturbance, psychotic disturbance, mood disturbance, and anxiety: Secondary | ICD-10-CM

## 2022-09-05 DIAGNOSIS — R413 Other amnesia: Secondary | ICD-10-CM

## 2022-09-05 NOTE — Progress Notes (Signed)
FAQ 26

## 2022-09-05 NOTE — Progress Notes (Addendum)
[id number]    Provider:  Dr Carol Perez Perez Requesting Provider: Hali Perez, * Primary Care Provider:  [name], MD   CC: cognitive decline  09/05/2022: Patient here for follow up on testing. She did not have an ApoE4 gene (e3/e3); discussed lecanumab: Patient's been diagnosed with mild cognitive impairment  PET scan showed amyloid in the brain, markers were positive in the blood for Alzheimer's disease, we performed a Moca today, MMSE, filled out all the paperwork for Lecanemab, extended visit took myself over 70 minutes today to discuss Lecantemab and fill out all the paperwork and answer all questions.  I also performed. functional assessment scale today.  We discussed the following and more, I provided literature:   The main challenge will be arranging the follow up MRIs (baseline, before 5, 7, 14th infusions).  We checked with our infusion suite and with the team and eventually found out MRI in October will be fine for the baseline MRI.  Below is directly from the literature.  I provided more literature.  They understand the risks especially the increased bleeding since patient has a significant burden of amyloid plaque in her brain and is homozygote for you for a e4 Alzheimer's gene she is at a much higher rate of Aria up to 45%.  "Incidence of ARIA Symptomatic ARIA occurred in 3% (29/898) of patients treated with [facility] in Study 2 [see Clinical Studies (14)]. Serious symptoms associated with ARIA were reported in 0.7% (6/898) of patients treated with LEQEMBI. Including asymptomatic radiographic events, ARIA was observed in 21% (191/898) of patients treated with [facility], compared to 9% [id number]) of patients on placebo in Study 2   ApoE e4 Carrier Status and Risk of ARIA Approximately 15% of Alzheimer's disease patients are ApoE e4 homozygotes.   The incidence of ARIA was higher in ApoE e4 homozygotes (45% on LEQEMBI vs. 22% on placebo) than in  heterozygotes (19% on LEQEMBI vs 9% on placebo) and noncarriers (13% on LEQEMBI vs 4% on placebo). Among patients treated with LEQEMBI, symptomatic ARIA-E occurred in 9% of ApoE e4 homozygotes compared with 2% of heterozygotes and 1% noncarriers. Serious events of ARIA occurred in 3% of ApoE e4 homozygotes, and approximately 1% of heterozygotes and noncarriers.  The recommendations on management of ARIA do not differ between ApoE e4 carriers and noncarriers [see Dosage and Administration (2.3)]. Testing for ApoE e4 status should be performed prior to initiation of treatment to inform the risk of developing ARIA. Prior to testing, prescribers should discuss with patients the risk of ARIA across genotypes and the implications of genetic testing results. Prescribers should inform patients that if genotype testing is not performed they can still be treated with [facility]; however, it cannot be determined if they are ApoE e4 homozygotes and at higher risk for ARIA. An FDA-authorized test for the detection of ApoE e4 alleles to identify patients at risk of ARIA if treated with [facility] is not currently available. Currently available tests used to identify ApoE e4 alleles may vary in accuracy and design."    PET Amyloid: FINDINGS: Findings: There is diffusely increased florbetapir uptake seen in the cortical cerebral gray matter of the temporal, frontal, occipital, and parietal lobes, with these regions showing clear loss of the normal gray-white contrast. The cerebellum has no evidence of abnormal uptake.   IMPRESSION: IMPRESSION   The scan is positive for brain amyloid and is most consistent with the presence of moderate to frequent neuritic beta-amyloid plaques in the brain.  Dx:  Alzheimer disease ([place]); MCI (mild co...   2 Result Notes     1 Patient Communication     2 Follow-up Encounters    Component Ref Range & Units 2 mo ago  A -- Beta-amyloid 42/40 Ratio >0.102 0.089 Low   Beta-amyloid  42 pg/mL 21.89  Beta-amyloid 40 pg/mL 245.80  T -- p-tau181 0.00 - 0.97 pg/mL 1.72 High   N -- NfL, Plasma 0.00 - 7.64 pg/mL 3.40  ATN SUMMARY Comment  Comment:                        A+ T+ N- A low beta-amyloid 42/40 and a high pTau181 concentration were observed. A normal NfL concentration was observed at this time. These results are consistent with the presence of Alzheimer's related pathology. Plasma findings may be less precise than CSF or PET. Additional assessments may be necessary. These tests are intended to be used in the context of clinical care.  Information: Comment  Comment: Beta-amyloid 42 and Beta-amyloid 40: Plasma beta-amyloid 1-42/1-40 ratios less than or equal to 0.102 suggest a higher probability of a patient being clinically diagnosed with Alzheimer's Disease (AD), while values above 0.102 suggest a lower probability of AD diagnosis. Precise plasma testing of Beta-amyloid 42 and Beta-amyloid 40 has demonstrated comparable effectiveness to traditional cerebrospinal fluid testing and amyloid positron emission tomography (PET) scans. When assessing the risk of AD pathology as the underlying cause for mild cognitive impairment (MCI) or dementia, it is important to consider various factors such as medical and family history, nutritional deficiency biomarkers, neuroimaging, and physical, neurological, and neuropsychological examinations. These tests were developed and their performance characteristics determined by [facility]. They have not been cleared or approved by the Food and Drug Administration.      07/03/2022: Patient reports Short term memory loss. Bad direction sense. She has trouble coming up with words. She is forgetting conversations but it comes back. APoE4 was E3/E3. Had diarrhea on Ariceot. Started Namenda and started having dizziness and no ear infection she decreased the nemenda and I have advised her to stop it because dizziness still occurring. STOP  NAMENDA. Has not tried the adlarity yet. FDG PET Scan was denied.   Patient complains of symptoms per HPI as well as the following symptoms: memory loss . Pertinent negatives and positives per HPI. All others negative   05/15/2022: This is a patient we have been following for patient reported memory loss.  Last time I saw her was in April, she had formal memory testing completed, largely inconclusive, she was diagnosed with mild cognitive impairment amnestic type and she has a long family history of Alzheimer's with the possibility this could be prodromal Alzheimer's.  She is here for follow-up, SHe had an episode of confusion likely due to UTI. Could not tolerate aricept, diarrhea. Other anticholinestase medications likely to do the same. Difficulty swallowing pills.  We discussed restarting Aricept patch, other medications such as Namenda, we also had a very long conversation about the new monoclonal antibody that is on the market just approved by the FDA in July, we also talked about melatonin which is fine she has had a problem with sleeping, she wakes up with dry mouth, she snores.  We also talked about FDG PET scan and PET amyloid scan.  I answered all questions. Patient complains of symptoms per HPI as well as the following symptoms: none . Pertinent negatives and positives per HPI. All others negative  09/12/2021: Here for  followup after formal memory testing completed.  Memory testing was largely inconclusive, I reviewed Dr. McDermott's notes with patient, but patient never followed back up with Dr. Vikki Ports so that he could review all the testing with her which I highly recommend and asked them to call the office.  She was diagnosed with mild cognitive impairment amnestic type, she has a long family history of Alzheimer's, could be prodromal Alzheimer's at this time unclear.  She will need to undergo a repeat likely in a year if her symptoms worsen, in the meantime given the uncertainty of her memory  loss I am going to recommend an FDG PET scan which is highly sensitive and specific to Alzheimer's versus frontotemporal dementia.  No suspicion for vascular dementia given her MRI of the brain.  Anesthesia and normal cognitive aging could be contributory, but although she was diagnosed with MCI: "Test results revealed intact functioning across most cognitive domains and thinking skills assessed during this evaluation with the exception of exceptionally low performance on certain aspects of memory (i.e. new learning and free recall of contextual information and visually presented material), but with intact verbal free recall and recognition. Other intact areas included executive functions, attention and processing speed, language, and visuospatial judgment. From an emotional standpoint, on self-report measures, there appears to be at least a mild degree of recent depression.  Ms. Bisaillon performance evidences mild neurocognitive challenges primarily manifesting as difficulties with certain aspects of learning and memory. At present, an exact etiology is unclear, though we have to entertain the possibility of a prodromal neurodegenerative process but nothing has been substantiated of yet. It is possible that the surgery and subsequent complications could be playing a role (or at least contributing) but further follow-up and monitoring is recommended  REFERRING DIAGNOSIS:  Memory changes   FINAL DIAGNOSIS (ICD-10 considerations):  Mild Neurocognitive Disorder (i.e., mild cognitive impairment), amnestic (etiology unclear)"   06/28/2021: She has major low back surgery in 2022 and she was under anesthesia for a long time. She feels her memory is impaired since, she can't remember names, she can't remember if she took her pills. She hs alzheimer's and strokes in her family. She is perfoeming hr own IADLs and ADLs. Her husband has to check your bills. She drives fine. No accidents in the home, people are noticing  hr husband. She is having word-finding difficulty.   MRI brain 06/2021: IMPRESSION: personally reviewed and agree with findings (addition 10 minutes to appointment)   MRI brain (with and without) demonstrating: - Stable, mild periventricular and subcortical foci of nonspecific T2 hyperintensities.  No abnormal lesions are seen on post contrast views.   - No acute findings.  Labs 07/07/2021: B12 folate b1, mma, homocysteine, tsh unremarkable   Patient complains of symptoms per HPI as well as the following symptoms: MCI . Pertinent negatives and positives per HPI. All others negative    HPI:  Carol Perez Perez is a 82 y.o. female here as requested by Carol Perez Perez, * for head pain. She woke up one morning with pain in the right scalp. Impulses. They got severe that evening, she couldn't sleep, she tried CBD and melatonin, shooting pain behind the ear, not burning, she took Gabapentin (she had some for her low back pain and sciatica). She has seen a neurosurgeon and he was worried about her neck. She has a lot of neck pain. The pain is gone. The symptoms are better and she has not had it for a few days. Her  low back is better. But she has a lot of neck problems, she has seen a neurosurgeon in the past. She exercises at least 5 morning a week, she exercises her neck. She has tightness in her neck. She has very tight neck muscles.   Reviewed notes, labs and imaging from outside physicians, which showed:  Personally reviewed images of MRI of the brain and MRI trigeminal protocol, essentially normal for age, some very minimal white matter changes, no vascular compression.  Bmp,cbc unremarkable  Reviewed notes from emergency room visit where she was seen for right temporal sharp pain which does not radiate she reported 7 out of 10 pain which was sudden and lasting for 2 days waxing and waning no worsening to bright light coughing or straining, no other associated symptoms, examination and neurologic  examination were unremarkable and nonfocal.  She did not have tenderness in the area, her pains could not be provoked, there were no rashes or focal deficits on exam, normal gait, they were concerned for trigeminal neuralgia.  She was given gabapentin 3 times daily.  Review of Systems: Patient complains of symptoms per HPI as well as the following symptoms: memory loss . Pertinent negatives and positives per HPI. All others negative    Social History   Socioeconomic History   Marital status: Married    Spouse name: Not on file   Number of children: 2   Years of education: Not on file   Highest education level: Associate degree: occupational, Hotel manager, or vocational program  Occupational History   Occupation: Retired   Tobacco Use   Smoking status: Former    Types: Cigarettes    Start date: 06/12/1977   Smokeless tobacco: Never   Tobacco comments:    42 years ago. Smoked from age [age]  Vaping Use   Vaping Use: Never used  Substance and Sexual Activity   Alcohol use: Yes    Alcohol/week: 7.0 standard drinks of alcohol    Types: 7 Shots of liquor per week    Comment: one gin and tonic at night   Drug use: No   Sexual activity: Not on file  Other Topics Concern   Not on file  Social History Narrative   Tobacco use, amount per day now: No   Past tobacco use, amount per day: Smoked ages [age]   How many years did you use tobacco: 17-20 years   Alcohol use (drinks per week): 1 glass of Red Wine per day.   Diet: Good   Do you drink/eat things with caffeine: No   Marital status: Married                                  What year were you married? 1978   Do you live in a house, apartment, assisted living, condo, trailer, etc.? Apartment/Independent Living   Is it one or more stories? 1   How many persons live in your home? 2    Do you have pets in your home?( please list) No   Highest Level of Education completed? 2 years of college.    Current or past profession: Retired  Futures trader   Do you exercise?  Yes                                Type and how often? 9min workout 5 days week. Walking 3-4  Days a Week.    Do you have a living will? Yes   Do you have a DNR form?  Yes                                 If not, do you want to discuss one?   Do you have signed POA/HPOA forms? Yes                       If so, please bring to you appointment      Do you have difficulty bathing or dressing yourself? No   Do you have difficulty preparing food or eating? No   Do you have difficulty managing your medications? No   Do you have difficulty managing your finances? No   Do you have difficulty affording your medications? No       Per [facility] New Patient Packet. Abstracted by Jasmine/RMA.    Social Determinants of Health   Financial Resource Strain: Not on file  Food Insecurity: Not on file  Transportation Needs: Not on file  Physical Activity: Not on file  Stress: Not on file  Social Connections: Not on file  Intimate Partner Violence: Not on file    Family History  Problem Relation Age of Onset   Cancer Mother    Dementia Mother    Alzheimer's disease Mother    Cancer Father    Alzheimer's disease Maternal Grandmother    Dementia Maternal Grandmother    Alzheimer's disease Maternal Grandfather     Past Medical History:  Diagnosis Date   Barrett's esophagus    Colitis    DDD (degenerative disc disease)    Factor 5 Leiden mutation, heterozygous [facility])    Per [place] New Patient Packet.   Frequent UTI    H/O echocardiogram 04/01/2019   Per [place] Patient Packet.   Hiatal hernia    Per [place] New Patient Packet.   High grade dysplasia of Barrett's epithelium    Per [facility] New Patient Packet.   History of bladder infections    Per [facility] New Patient Packet.   History of Papanicolaou smear of cervix 10/16/2011   Per [place] Patient Packet   Hypoglycemia    Hypotension    Per [facility] New Patient Packet.   IBS (irritable bowel syndrome)    Per [place] New Patient  Packet.   Lactose intolerance    Osteoarthritis    Per [place] New Patient Packet.   Osteopenia    Per [place] New Patient Packet.   Osteoporosis    Premature ventricular complex    Raynaud's disease    Per [place] Patient Packet.   Scoliosis    Per [facility] New Patient Packet.    Patient Active Problem List   Diagnosis Date Noted   IFG (impaired fasting glucose) 07/10/2022   Neuropathy of both feet 07/10/2022   Dry eye 07/10/2022   MCI (mild cognitive impairment) 05/16/2022   FHx: Alzheimer's disease 05/16/2022   Nocturnal leg cramps 11/23/2021   Overactive bladder 11/23/2021   Depression, recurrent ([place]) 08/24/2020   Atrophic vaginitis 08/24/2020   Blood loss anemia 08/24/2020   Hyponatremia    Lumbar scoliosis 08/12/2020   Occipital neuralgia of right side 09/08/2019   Recurrent UTI 06/26/2019   Osteoporosis 06/26/2019   IBS (irritable bowel syndrome) 06/26/2019   Hyperlipidemia 06/26/2019   Back pain 06/26/2019   Barrett's esophagus 06/26/2019  GERD (gastroesophageal reflux disease) 05/01/2019    Past Surgical History:  Procedure Laterality Date   ABDOMINAL EXPOSURE N/A 08/12/2020   Procedure: ABDOMINAL EXPOSURE;  Surgeon: Serafina Mitchell, MD;  Location: [place];  Service: Vascular;  Laterality: N/A;   ABDOMINAL HYSTERECTOMY  06/13/2011   By Dr.Scherer at [facility]. Per [facility] New Patient Packet.   ANTERIOR LAT LUMBAR FUSION Right 08/12/2020   Procedure: Right Lumbar two-three, Lumbar three-four, Lumbar four-five Anterolateral lumbar interbody fusion;  Surgeon: Erline Levine, MD;  Location: [place];  Service: Neurosurgery;  Laterality: Right;   ANTERIOR LUMBAR FUSION N/A 08/12/2020   Procedure: Lumbar five Sacral one Anterior lumbar interbody fusion;  Surgeon: Erline Levine, MD;  Location: [place];  Service: Neurosurgery;  Laterality: N/A;   BREAST EXCISIONAL BIOPSY Left 30+ yrs ago   COLONOSCOPY  11/23/2011   By Dr.McCune at [facility]. Per [place] Patient Packet.    COLONOSCOPY  08/26/2019   LUMBAR PERCUTANEOUS PEDICLE SCREW 4 LEVEL N/A 08/12/2020   Procedure: Percutaneous pedicle screw fixation from Lumbar two to Sacral one ;  Surgeon: Erline Levine, MD;  Location: [place];  Service: Neurosurgery;  Laterality: N/A;   MAMMOGRAM  04/14/2019   pap smear  10/16/2011   SIGMOIDOSCOPY  11/02/2014   Per [place] New Patient Packet   TONSILLECTOMY  06/12/1946   Per [facility] New Patient Packet.    Current Outpatient Medications  Medication Sig Dispense Refill   aspirin EC 81 MG tablet Take 81 mg by mouth 3 (three) times daily.     atorvastatin (LIPITOR) 10 MG tablet TAKE ONE (1) TABLET BY MOUTH EACH DAY 90 tablet 1   Bismuth Subsalicylate (PEPTO-BISMOL PO) Take by mouth as needed.     buPROPion (WELLBUTRIN XL) 150 MG 24 hr tablet TAKE ONE (1) TABLET BY MOUTH EVERY DAY 30 tablet 3   CALCIUM PO Take 1,200 mg by mouth daily.      cephALEXin (KEFLEX) 500 MG capsule Take 1 capsule (500 mg total) by mouth 2 (two) times daily. 10 capsule 0   cholecalciferol (VITAMIN D3) 25 MCG (1000 UNIT) tablet Take 3,000 Units by mouth daily.     conjugated estrogens (PREMARIN) vaginal cream Place 1 applicator vaginally once a week.     methenamine (HIPREX) 1 g tablet Take 1 g by mouth 2 (two) times daily with a meal. For UTI Prevention. Last UTI: October 20th, November 5th, and December 28th.     Multiple Vitamin (MULTIVITAMIN WITH MINERALS) TABS tablet Take 1 tablet by mouth daily. One A Day for Women     mupirocin ointment (BACTROBAN) 2 % Apply to finger BID x 10 days 30 g 0   nystatin-triamcinolone ointment (MYCOLOG) Apply 1 Application topically 2 (two) times daily. Apply to affected area in groin 15 g 0   Omeprazole-Sodium Bicarbonate (ZEGERID) 20-1100 MG CAPS capsule Take 1 capsule by mouth 2 (two) times daily.     OVER THE COUNTER MEDICATION Place 1 drop into both ears as needed. Grapefruit Seed Extract Oil     Polyethyl Glycol-Propyl Glycol (SYSTANE) 0.4-0.3 % SOLN Place 1 drop into  both eyes as needed (dry/irritated eyes).     pregabalin (LYRICA) 25 MG capsule Take 25 mg by mouth at bedtime.     Probiotic Product (PROBIOTIC COLON SUPPORT PO) Take 1 tablet by mouth in the morning and at bedtime.     psyllium (METAMUCIL) 58.6 % powder Take 1 packet by mouth 3 (three) times daily.  valACYclovir (VALTREX) 500 MG tablet Take 1 tablet (500 mg total) by mouth daily as needed. 270 tablet 0   No current facility-administered medications for this visit.    Allergies as of 09/05/2022 - Review Complete 09/05/2022  Allergen Reaction Noted   Banana Swelling 09/08/2019   Latex Swelling, Rash, and Other (See Comments) 01/14/2011   Ciprofloxacin hcl Other (See Comments) 08/03/2013   Gatifloxacin Other (See Comments) 08/03/2013   Levofloxacin Other (See Comments) 08/03/2013   Moxifloxacin Other (See Comments) 08/03/2013   Norfloxacin Other (See Comments) 08/03/2013   Nsaids Other (See Comments) 01/14/2011   Ofloxacin Other (See Comments) 08/03/2013   Penicillins Hives 01/14/2011   Donepezil  08/07/2022   Methylisothiazolinone Rash and Other (See Comments) 04/12/2017   Tape Rash 04/12/2017    Vitals: BP (!) 141/73   Pulse 74   Ht 5\' 1"  (1.549 m)   Wt 147 lb 3.2 oz (66.8 kg)   BMI 27.81 kg/m  Last Weight:  Wt Readings from Last 1 Encounters:  09/05/22 147 lb 3.2 oz (66.8 kg)   Last Height:   Ht Readings from Last 1 Encounters:  09/05/22 5\' 1"  (1.549 m)  Physical exam: Exam: Gen: NAD, conversant  Speech:    Speech is normal; fluent and spontaneous with normal comprehension.  Cognition:    The patient is oriented to person, place, and time;     recent and remote memory intact;     language fluent;     normal attention, concentration,     fund of knowledge     09/05/2022    2:17 PM 05/15/2022   11:05 AM 03/28/2022    2:51 PM  MMSE - Mini Mental State Exam  Orientation to time 4 5 3   Orientation to Place 5 5 5   Registration 3 3 3   Attention/ Calculation 3  2 5   Recall 1 3 3   Language- name 2 objects 2 2 2   Language- repeat 1 1 1   Language- follow 3 step command 3 3 3   Language- read & follow direction 1 1 1   Write a sentence 1 1 1   Copy design 1 1 1   Total score 25 27 28      Assessment/Plan: This is a really lovely 82 year old with memory loss. MRI unremarkable for age, EEG. Formal memory tsting with Dr. Vikki Ports revealed MCI amnestic type possibly per prodromal Alzheimer's and she has a family history of Alzheimer's. Patient here for follow up on testing. She did not have an ApoE4 gene (e3/e3); discussed lecanumab: Patient's been diagnosed with mild cognitive impairment,  PET scan showed amyloid in the brain, markers were positive in the blood for Alzheimer's disease, we performed a Moca today, MMSE, filled out all the paperwork for Lecanemab, extended visit took myself over 70 minutes today to discuss Lecantemab and fill out all the paperwork and answer all questions.  I also performed. functional assessment scale today. Need MRI brain within the last year as a baseline for lecanemab and her worsening cognition, will order.  Orders Placed This Encounter  Procedures   MR BRAIN W WO CONTRAST    1.Did not tolerate oral aricept. Memantine BID made her dizzy.   2. Aricept Patch - keep in the fridage, wait and start until AFTER you are on namenda/menantine twice daily and doing well 4-6 weeks. She is going to start it now. Tried it.  3. Consider Lecanemab - new, just approved by FDA in July, provided information and answered questions(see below)  4. Melatonin ok  at night 1-5 mg at bedtime.   5. Wakes up with dry mouth, she snores, naps, memory loss, would like to send her for sleep test, wakes often. Sleep apnea can be a risk factor for dementia   We discussed the following and more, I provided literature:   The main challenge will be arranging the follow up MRIs (baseline, before 5, 7, 14th infusions).  We checked with our infusion suite  and with the team and eventually found out MRI in October will be fine for the baseline MRI.  Below is directly from the literature.  I provided more literature.  They understand the risks especially the increased bleeding since patient has a significant burden of amyloid plaque in her brain and is homozygote for you for a e4 Alzheimer's gene she is at a much higher rate of Aria up to 45%.  "Incidence of ARIA Symptomatic ARIA occurred in 3% (29/898) of patients treated with [facility] [facility] in Study 2 [see Clinical Studies (14)]. Serious symptoms associated with ARIA were reported in 0.7% (6/898) of patients treated with LEQEMBI. Including asymptomatic radiographic events, ARIA was observed in 21% (191/898) of patients treated with [facility], compared to 9% [id number]) of patients on placebo in Study 2   ApoE e4 Carrier Status and Risk of ARIA Approximately 15% of Alzheimer's disease patients are ApoE e4 homozygotes.   The incidence of ARIA was higher in ApoE e4 homozygotes (45% on LEQEMBI vs. 22% on placebo) than in heterozygotes (19% on LEQEMBI vs 9% on placebo) and noncarriers (13% on LEQEMBI vs 4% on placebo). Among patients treated with LEQEMBI, symptomatic ARIA-E occurred in 9% of ApoE e4 homozygotes compared with 2% of heterozygotes and 1% noncarriers. Serious events of ARIA occurred in 3% of ApoE e4 homozygotes, and approximately 1% of heterozygotes and noncarriers.  The recommendations on management of ARIA do not differ between ApoE e4 carriers and noncarriers [see Dosage and Administration (2.3)]. Testing for ApoE e4 status should be performed prior to initiation of treatment to inform the risk of developing ARIA. Prior to testing, prescribers should discuss with patients the risk of ARIA across genotypes and the implications of genetic testing results. Prescribers should inform patients that if genotype testing is not performed they can still be treated with [facility]; however, it cannot be determined if they  are ApoE e4 homozygotes and at higher risk for ARIA. An FDA-authorized test for the detection of ApoE e4 alleles to identify patients at risk of ARIA if treated with [facility] is not currently available. Currently available tests used to identify ApoE e4 alleles may vary in accuracy and design."   Orders Placed This Encounter  Procedures   MR BRAIN W WO CONTRAST   No orders of the defined types were placed in this encounter.    Reviewed the following with patient and husband from dr. Mcdermott's notes:  SUMMARY & IMPRESSION: Carol Perez Perez is a 82 year old, right-handed, White female, who reported experiencing mild cognitive challenges that began within the past 2 years and were exacerbated approximately 1 year ago by a major spinal surgery that took place (March 2022). She reported some complications following the surgery that prolonged her hospitalization to include hyponatremia and pneumonia. While she feels that she has improved to a degree, she has yet to return to baseline.  Test results revealed intact functioning across most cognitive domains and thinking skills assessed during this evaluation with the exception of exceptionally low performance on certain aspects of memory (i.e. new learning and free recall  of contextual information and visually presented material), but with intact verbal free recall and recognition. Other intact areas included executive functions, attention and processing speed, language, and visuospatial judgment. From an emotional standpoint, on self-report measures, there appears to be at least a mild degree of recent depression.  Ms. Laidig performance evidences mild neurocognitive challenges primarily manifesting as difficulties with certain aspects of learning and memory. At present, an exact etiology is unclear, though we have to entertain the possibility of a prodromal neurodegenerative process but nothing has been substantiated of yet. It is possible that the surgery  and subsequent complications could be playing a role (or at least contributing) but further follow-up and monitoring is recommended  REFERRING DIAGNOSIS:  Memory changes   FINAL DIAGNOSIS (ICD-10 considerations):  Mild Neurocognitive Disorder (i.e., mild cognitive impairment), amnestic (etiology unclear)  RECOMMENDATIONS: 1. Follow-up with Dr. Jaynee Perez.   Could consider treatment with cholinesterase inhibitor.   It is recommended that the patient aggressively manage any modifiable risk factors for further cognitive decline such as strict compliance with prescribed medical treatments for any cerebrovascular risk factors (e.g., high cholesterol, high blood pressure, sleep apnea, diabetes).  Follow-up with the [facility] is recommended in 12 months for educational purposes, psychosocial concerns, and neurocognitive testing as necessary. If you wish to make this follow-up appointment, please contact our office at [phone].  2. In response to the growing number of families affected by memory loss and dementia, the Memory Counseling Program (MCP) was established in 2011 with the support of the Section on Gerontology and Geriatric Medicine at [facility]. The MCP provides counseling services for individuals diagnosed with mild cognitive impairment (MCI), Alzheimer's disease, or another form of dementia, as well as to their family members. Services include individual, couple, and family counseling, as well as support groups, all of which provide a safe environment to talk about the journey with mild cognitive impairment or dementia, learn as much as possible about the disease, problem solve some of the common challenges encountered with memory and cognitive loss, and strengthen relationships.  The MCP is staffed by licensed practitioners. All services are rendered in a professional manner consistent with the ethical  standards of the American [employer] Association and the [employer].  The MCP is located in the [facility] on the [place]. In general, we see clients every few weeks or monthly, depending on the need. Sessions are typically 45-60 minutes long, scheduled at mutually agreed upon times. Referrals can be made by a health care professional or by self-referral.   Contact: [phone]  3. The patient is encouraged to attend to lifestyle factors for brain health (e.g., regular physical exercise, good nutrition habits, regular participation in cognitively-stimulating activities, and general stress management techniques), which are likely to have benefits for both emotional adjustment and cognition. In fact, in addition to promoting general good health, regular exercise incorporating aerobic activities (e.g., brisk walking, jogging, bicycling, etc.) has been demonstrated to be a very effective treatment for depression and stress, with similar efficacy rates to both antidepressant medication and psychotherapy. And for those with orthopedic issues, water aerobics may be particularly beneficial.  4. Nutritional factors can have a significant effect on psychological and emotional status, as well as overall brain functioning. The following general recommendations have been associated with improvements in depression and other psychological symptoms, as well as lower risk for dementia and other forms  of cognitive impairment. Please discuss these recommendations with your physician and/or dietitian before initiating:   Consume a wide variety of fresh fruits and vegetables, particularly including brightly colored items such as berries, oranges, tomatoes, peppers, carrots, broccoli, spinach, dark green lettuces, sweet potatoes, etc., all of which are high in vitamins and antioxidants.   Consume foods that are high in fiber, such as legumes (e.g., beans, peas,  lentils) and foods made from whole grains (e.g., whole wheat bread and pasta)   Consume a significant amount of omega-3 essential fats and oils. These can be found in natural food sources such as salmon and other fatty fish, and also products made from flax seed and flax seed oil. Alternately, dietary supplementation with fish oil capsules and flax seed oil capsules is a good way to boost one's level of omega-3 consumption. It is important to check with your doctor before taking these supplements, especially if you take blood thinning medication.   If you do not already do so, consider taking a quality multivitamin supplement under the guidance of your primary care physician.    Consider keeping consumption of the following foods to a minimum: 1) foods made from white flour and white sugar; 2) artificial sweeteners; 3) deep-fried foods; 4) animal fat other than fish; 5) any foods containing "hydrogenated" or "trans" fats; 6) most other types of highly processed packaged/prepared foods.   5. Try to keep in mind that common word finding errors are not necessarily the start of a dreadful decline. Over-focusing on these errors can contribute to further distraction and emotions that can detract from effective retrieval of words; this in turn, can lead to greater distress and more difficulties with recalling the specific word you were looking for to begin with.   The following are several strategies that may help:   Performance will generally be best in a structured, routine, and familiar environment, as opposed to situations involving complex problems.   Designate a place to keep your keys, wallet, cell phone, and other personal belongings.  Take time to register and process information to be remembered. Deeper encoding of information can be gained by forming a mental picture, making meaningful associations, connecting new information to previously learned and related information, paraphrasing and  repetition.   To the extent possible, multitasking should be avoided; break down tasks into smaller steps to help get started and to keep from feeling overwhelmed. And if there are difficulties in organization and planning, maintaining a daily organizer to help keep track of important appointments and information may be beneficial.   Memory problems may at least be minimally addressed using compensatory strategies such as the use of a daily schedule to follow, memos, portable recorder, a centrally located bulletin board, or memory notebook. A large calendar, placed in a highly visible location would be valuable to keep track of dates and appointments. In addition, it would be helpful to keep a log of all of medical appointments with the name of the doctor, date of visit, diagnoses, and treatments.   Use of a medication box is recommended to ensure compliance and decrease confusion regarding medication dosages, times, and dates.  To aid in attention, the patient may consider using some of the following strategies: o The patient should simplify tasks. There may be a need to break overly complex activities into simple step-by-step tasks, keep these steps written down in a note book and then check them off as they are completed which will help to stay on task and  make sure the whole task is finished.  o The patient should set deadlines for everything, even for seemingly small tasks, prioritize time-sensitive tasks and write down every assignment, message, or important thought. o The patient is encouraged to use timers and alarms to stay on track and take breaks at regular intervals. Avoid piles of paperwork or procrastination by dealing with each item as it comes in.      Cc: Beaulah Dinning, MD  Sarina Ill, MD  [facility] [address] [place],  [address] Phone [phone] Fax [phone]  I spent over 70 minutes of  face-to-face and non-face-to-face time with patient on the  1. Mild dementia, unspecified dementia type, unspecified whether behavioral, psychotic, or mood disturbance or anxiety ([place])   2. Alzheimer's disease, unspecified (CODE) ([place])   3. Cognitive decline   4. Memory loss   5. FHx: Alzheimer's disease   6. Gets lost in familiar location    diagnosis.  This included previsit chart review, lab review, study review, order entry, electronic health record documentation, patient education on the different diagnostic and therapeutic options, counseling and coordination of care, risks and benefits of management, compliance, or risk factor reduction

## 2022-09-05 NOTE — Patient Instructions (Signed)
MRI of the brain w/wo contrast 

## 2022-09-05 NOTE — Telephone Encounter (Signed)
[employer] medicare NPR sent to GI [phone] 

## 2022-09-06 LAB — URINE CULTURE
Culture: NO GROWTH
Special Requests: NORMAL

## 2022-09-22 ENCOUNTER — Ambulatory Visit
Admission: RE | Admit: 2022-09-22 | Discharge: 2022-09-22 | Disposition: A | Discharge status: Home / Self Care | Payer: Medicare Other | Source: Ambulatory Visit | Attending: Neurology | Admitting: Neurology

## 2022-09-22 DIAGNOSIS — G309 Alzheimer's disease, unspecified: Secondary | ICD-10-CM | POA: Diagnosis not present

## 2022-09-22 DIAGNOSIS — F03A0 Unspecified dementia, mild, without behavioral disturbance, psychotic disturbance, mood disturbance, and anxiety: Secondary | ICD-10-CM

## 2022-09-22 DIAGNOSIS — R4189 Other symptoms and signs involving cognitive functions and awareness: Secondary | ICD-10-CM

## 2022-09-22 DIAGNOSIS — R413 Other amnesia: Secondary | ICD-10-CM | POA: Diagnosis not present

## 2022-09-22 DIAGNOSIS — Z820 Family history of epilepsy and other diseases of the nervous system: Secondary | ICD-10-CM

## 2022-09-22 MED ORDER — GADOPICLENOL 0.5 MMOL/ML IV SOLN
7.5000 mL | Freq: Once | INTRAVENOUS | Status: AC | PRN
  Administered 2022-09-22: 7.5 mL via INTRAVENOUS

## 2022-09-26 ENCOUNTER — Encounter: Payer: Self-pay | Admitting: Emergency Medicine

## 2022-09-26 ENCOUNTER — Ambulatory Visit
Admission: EM | Admit: 2022-09-26 | Discharge: 2022-09-26 | Disposition: A | Discharge status: Home / Self Care | Payer: Medicare Other | Attending: Family Medicine | Admitting: Family Medicine

## 2022-09-26 DIAGNOSIS — N3001 Acute cystitis with hematuria: Secondary | ICD-10-CM | POA: Diagnosis not present

## 2022-09-26 LAB — POCT URINALYSIS DIP (MANUAL ENTRY)
Bilirubin, UA: NEGATIVE
Glucose, UA: NEGATIVE mg/dL
Ketones, POC UA: NEGATIVE mg/dL
Nitrite, UA: NEGATIVE
Protein Ur, POC: NEGATIVE mg/dL
Spec Grav, UA: 1.015 (ref 1.010–1.025)
Urobilinogen, UA: 0.2 E.U./dL
pH, UA: 7 (ref 5.0–8.0)

## 2022-09-26 MED ORDER — SULFAMETHOXAZOLE-TRIMETHOPRIM 800-160 MG PO TABS: 1.0000 | ORAL_TABLET | Freq: Two times a day (BID) | ORAL | 0 refills | Status: DC

## 2022-09-26 NOTE — Discharge Instructions (Addendum)
Instructed patient to take medication as directed with food to completion.  Encouraged increase daily water intake to 64 ounces per day while taking this medication.  Advised we will follow-up with urine culture results once received.  Advised if symptoms worsen and/or unresolved please follow-up with PCP or here for further evaluation. 

## 2022-09-26 NOTE — ED Triage Notes (Signed)
Patient c/o urgency, frequency and dysuria x 2 days.  Denies any hematuria.  Patient did take AZO yesterday.

## 2022-09-26 NOTE — ED Provider Notes (Signed)
[facility]    CSN: [id number] Arrival date & time: 09/26/22  1251      History   Chief Complaint Chief Complaint  Patient presents with   Dysuria    HPI Carol Perez is a 82 y.o. female.   HPI 81 year old female presents with PMH significant for frequent UTI, history of bladder infections, IBS, and Barrett's esophagus.  Past Medical History:  Diagnosis Date   Barrett's esophagus    Colitis    DDD (degenerative disc disease)    Factor 5 Leiden mutation, heterozygous    Per [facility] New Patient Packet.   Frequent UTI    H/O echocardiogram 04/01/2019   Per [facility] New Patient Packet.   Hiatal hernia    Per [facility] New Patient Packet.   High grade dysplasia of Barrett's epithelium    Per [facility] New Patient Packet.   History of bladder infections    Per [facility] New Patient Packet.   History of Papanicolaou smear of cervix 10/16/2011   Per [facility] New Patient Packet   Hypoglycemia    Hypotension    Per [facility] New Patient Packet.   IBS (irritable bowel syndrome)    Per [facility] New Patient Packet.   Lactose intolerance    Osteoarthritis    Per [facility] New Patient Packet.   Osteopenia    Per [facility] New Patient Packet.   Osteoporosis    Premature ventricular complex    Raynaud's disease    Per [facility] New Patient Packet.   Scoliosis    Per [facility] New Patient Packet.    Patient Active Problem List   Diagnosis Date Noted   IFG (impaired fasting glucose) 07/10/2022   Neuropathy of both feet 07/10/2022   Dry eye 07/10/2022   MCI (mild cognitive impairment) 05/16/2022   FHx: Alzheimer's disease 05/16/2022   Nocturnal leg cramps 11/23/2021   Overactive bladder 11/23/2021   Depression, recurrent 08/24/2020   Atrophic vaginitis 08/24/2020   Blood loss anemia 08/24/2020   Hyponatremia    Lumbar scoliosis 08/12/2020   Occipital neuralgia of right side 09/08/2019   Recurrent UTI 06/26/2019   Osteoporosis 06/26/2019   IBS (irritable bowel syndrome) 06/26/2019   Hyperlipidemia 06/26/2019   Back  pain 06/26/2019   Barrett's esophagus 06/26/2019   GERD (gastroesophageal reflux disease) 05/01/2019    Past Surgical History:  Procedure Laterality Date   ABDOMINAL EXPOSURE N/A 08/12/2020   Procedure: ABDOMINAL EXPOSURE;  Surgeon: Nada Libman, MD;  Location: [facility] OR;  Service: Vascular;  Laterality: N/A;   ABDOMINAL HYSTERECTOMY  06/13/2011   [facility] New Patient Packet.   ANTERIOR LAT LUMBAR FUSION Right 08/12/2020   Procedure: Right Lumbar two-three, Lumbar three-four, Lumbar four-five Anterolateral lumbar interbody fusion;  Surgeon: Maeola Harman, MD;  Location[facility] OR;  Service: Neurosurgery;  Laterality: Right;   ANTERIOR LUMBAR FUSION N/A 08/12/2020   Procedure: Lumbar five Sacral one Anterior lumbar interbody fusion;  Surgeon: Maeola Harman, MD;  Location: [facility] OR;  Service: Neurosurgery;  Laterality: N/A;   BREAST EXCISIONAL BIOPSY Left 30+ yrs ago   COLONOSCOPY  11/23/2011   By Dr[facility]McCune at [facility]. Per [facility] New Patient Packet.   COLONOSCOPY  08/26/2019   LUMBAR PERCUTANEOUS PEDICLE SCREW 4 LEVEL N/A 08/12/2020   Procedure: Percutaneous pedicle screw fixation from Lumbar two to Sacral one ;  Surgeon: Maeola Harman, MD;  Location[facility] OR;  Service: Neurosurgery;  Laterality: N/A;   MAMMOGRAM  04/14/2019   pap smear  10/16/2011   SIGMOIDOSCOPY  11/02/2014   Per [facility] New Patient Packet   TONSILLECTOMY  06/12/1946   Per [facility] New Patient Packet.    OB History   No obstetric history on file.      Home Medications    Prior to Admission medications   Medication Sig Start Date End Date Taking? Authorizing Provider  aspirin EC 81 MG tablet Take 81 mg by mouth 3 (three) times daily.   Yes [provider]  atorvastatin (LIPITOR) 10 MG tablet TAKE ONE (1) TABLET BY MOUTH EACH DAY 06/28/22  Yes Frederica Kuster, MD  Bismuth Subsalicylate (PEPTO-BISMOL PO) Take by mouth as needed.   Yes [provider]  buPROPion  (WELLBUTRIN XL) 150 MG 24 hr tablet TAKE ONE (1) TABLET BY MOUTH EVERY DAY 06/28/22  Yes Ngetich, Dinah C, NP  cholecalciferol (VITAMIN D3) 25 MCG (1000 UNIT) tablet Take 3,000 Units by mouth daily.   Yes [provider]  conjugated estrogens (PREMARIN) vaginal cream Place 1 applicator vaginally once a week.   Yes [provider]  methenamine (HIPREX) 1 g tablet Take 1 g by mouth 2 (two) times daily with a meal. For UTI Prevention. Last UTI: October 20th, November 5th, and December 28th.   Yes [provider]  Multiple Vitamin (MULTIVITAMIN WITH MINERALS) TABS tablet Take 1 tablet by mouth daily. One A Day for Women   Yes [provider]  mupirocin ointment (BACTROBAN) 2 % Apply to finger BID x 10 days 07/19/22  Yes Agapito Games, MD  nystatin-triamcinolone ointment [facility]) Apply 1 Application topically 2 (two) times daily. Apply to affected area in groin 07/19/22  Yes Metheney, Barbarann Ehlers, MD  Omeprazole-Sodium Bicarbonate (ZEGERID) 20-1100 MG CAPS capsule Take 1 capsule by mouth 2 (two) times daily.   Yes [provider]  OVER THE COUNTER MEDICATION Place 1 drop into both ears as needed. Grapefruit Seed Extract Oil   Yes [provider]  Polyethyl Glycol-Propyl Glycol (SYSTANE) 0.4-0.3 % SOLN Place 1 drop into both eyes as needed (dry/irritated eyes).   Yes [provider]  pregabalin (LYRICA) 25 MG capsule Take 25 mg by mouth at bedtime.   Yes [provider]  Probiotic Product (PROBIOTIC COLON SUPPORT PO) Take 1 tablet by mouth in the morning and at bedtime.   Yes [provider]  psyllium (METAMUCIL) 58.6 % powder Take 1 packet by mouth 3 (three) times daily.   Yes [provider]  sulfamethoxazole-trimethoprim (BACTRIM DS) 800-160 MG tablet Take 1 tablet by mouth 2 (two) times daily for 7 days. 09/26/22 10/03/22 Yes Trevor Iha, FNP  valACYclovir (VALTREX) 500 MG tablet Take 1 tablet (500 mg total) by  mouth daily as needed. 03/30/22  Yes Frederica Kuster, MD  CALCIUM PO Take 1,200 mg by mouth daily.     [provider]  cephALEXin (KEFLEX) 500 MG capsule Take 1 capsule (500 mg total) by mouth 2 (two) times daily. 09/04/22   Eustace Moore, MD    Family History Family History  Problem Relation Age of Onset   Cancer Mother    Dementia Mother    Alzheimer's disease Mother    Cancer Father    Alzheimer's disease Maternal Grandmother    Dementia Maternal Grandmother    Alzheimer's disease Maternal Grandfather     Social History Social History   Tobacco Use   Smoking status: Former    Types: Cigarettes    Start date: 06/12/1977   Smokeless  tobacco: Never   Tobacco comments:    42 years ago. Smoked from age [age]  Vaping Use   Vaping Use: Never used  Substance Use Topics   Alcohol use: Yes    Alcohol/week: 7.0 standard drinks of alcohol    Types: 7 Shots of liquor per week    Comment: one gin and tonic at night   Drug use: No     Allergies   Banana, Latex, Ciprofloxacin hcl, Gatifloxacin, Levofloxacin, Moxifloxacin, Norfloxacin, Nsaids, Ofloxacin, Penicillins, Donepezil, Methylisothiazolinone, and Tape   Review of Systems Review of Systems  Genitourinary:  Positive for dysuria, frequency and hematuria.  All other systems reviewed and are negative.    Physical Exam Triage Vital Signs ED Triage Vitals  Enc Vitals Group     BP      Pulse      Resp      Temp      Temp src      SpO2      Weight      Height      Head Circumference      Peak Flow      Pain Score      Pain Loc      Pain Edu?      Excl. in GC?    No data found.  Updated Vital Signs BP 130/71 (BP Location: Left Arm)   Pulse 81   Temp 98.2 F (36.8 C) (Oral)   Resp 16   Ht  (1.549 m)   Wt 146 lb (66.2 kg)   SpO2 99%   BMI 27.59 kg/m      Physical Exam Vitals and nursing note reviewed.  Constitutional:      Appearance: Normal appearance. She is normal weight.   HENT:     Head: Normocephalic and atraumatic.     Mouth/Throat:     Mouth: Mucous membranes are moist.     Pharynx: Oropharynx is clear.  Eyes:     Conjunctiva/sclera: Conjunctivae normal.     Pupils: Pupils are equal, round, and reactive to light.  Cardiovascular:     Rate and Rhythm: Normal rate and regular rhythm.     Pulses: Normal pulses.     Heart sounds: Normal heart sounds.  Pulmonary:     Effort: Pulmonary effort is normal.     Breath sounds: Normal breath sounds. No wheezing or rhonchi.  Abdominal:     Tenderness: There is no right CVA tenderness or left CVA tenderness.  Musculoskeletal:        General: Normal range of motion.     Cervical back: Normal range of motion and neck supple.  Skin:    General: Skin is warm and dry.  Neurological:     General: No focal deficit present.     Mental Status: She is alert and oriented to person, place, and time. Mental status is at baseline.      UC Treatments / Results  Labs (all labs ordered are listed, but only abnormal results are displayed) Labs Reviewed  POCT URINALYSIS DIP (MANUAL ENTRY) - Abnormal; Notable for the following components:      Result Value   Color, UA light yellow (*)    Clarity, UA cloudy (*)    Blood, UA moderate (*)    Leukocytes, UA Moderate (2+) (*)    All other components within normal limits  URINE CULTURE    EKG   Radiology No results found.  Procedures Procedures (including critical care time)  Medications Ordered in UC Medications - No data to display  Initial Impression / Assessment and Plan / UC Course  I have reviewed the triage vital signs and the nursing notes.  Pertinent labs & imaging results that were available during my care of the patient were reviewed by me and considered in my medical decision making (see chart for details).     MDM: 1.  Acute cystitis with hematuria-Rx'd Bactrim 800/160 mg tablet twice daily x 7 days.  UA reveals above, urine culture ordered.  Instructed patient to take medication as directed with food to completion.  Encouraged increase daily water intake to 64 ounces per day while taking this medication.  Advised we will follow-up with urine culture results once received.  Advised if symptoms worsen and/or unresolved please follow-up with PCP or here for further evaluation.  Patient discharged home, hemodynamically stable. Final Clinical Impressions(s) / UC Diagnoses   Final diagnoses:  Acute cystitis with hematuria     Discharge Instructions      Instructed patient to take medication as directed with food to completion.  Encouraged increase daily water intake to 64 ounces per day while taking this medication.  Advised we will follow-up with urine culture results once received.  Advised if symptoms worsen and/or unresolved please follow-up with PCP or here for further evaluation.     ED Prescriptions     Medication Sig Dispense Auth. Provider   sulfamethoxazole-trimethoprim (BACTRIM DS) 800-160 MG tablet Take 1 tablet by mouth 2 (two) times daily for 7 days. 14 tablet Trevor Iha, FNP      PDMP not reviewed this encounter.   Trevor Iha, FNP 09/26/22 1338

## 2022-09-27 LAB — URINE CULTURE

## 2022-10-02 ENCOUNTER — Telehealth: Payer: Self-pay | Admitting: Neurology

## 2022-10-02 ENCOUNTER — Ambulatory Visit (INDEPENDENT_AMBULATORY_CARE_PROVIDER_SITE_OTHER): Payer: Medicare Other | Admitting: Family Medicine

## 2022-10-02 ENCOUNTER — Encounter: Payer: Self-pay | Admitting: Family Medicine

## 2022-10-02 VITALS — BP 155/70 | HR 69 | Ht 61.0 in | Wt 146.0 lb

## 2022-10-02 DIAGNOSIS — F321 Major depressive disorder, single episode, moderate: Secondary | ICD-10-CM

## 2022-10-02 DIAGNOSIS — N390 Urinary tract infection, site not specified: Secondary | ICD-10-CM | POA: Diagnosis not present

## 2022-10-02 DIAGNOSIS — F0280 Dementia in other diseases classified elsewhere without behavioral disturbance: Secondary | ICD-10-CM

## 2022-10-02 DIAGNOSIS — G309 Alzheimer's disease, unspecified: Secondary | ICD-10-CM

## 2022-10-02 LAB — POCT URINALYSIS DIP (CLINITEK)
Bilirubin, UA: NEGATIVE
Blood, UA: NEGATIVE
Glucose, UA: NEGATIVE mg/dL
Ketones, POC UA: NEGATIVE mg/dL
Leukocytes, UA: NEGATIVE
Nitrite, UA: NEGATIVE
POC PROTEIN,UA: NEGATIVE
Spec Grav, UA: 1.01 (ref 1.010–1.025)
Urobilinogen, UA: 0.2 E.U./dL
pH, UA: 7 (ref 5.0–8.0)

## 2022-10-02 MED ORDER — DULOXETINE HCL 30 MG PO CPEP: 30.0000 mg | ORAL_CAPSULE | Freq: Every day | ORAL | 1 refills | Status: DC

## 2022-10-02 NOTE — Telephone Encounter (Signed)
error 

## 2022-10-02 NOTE — Progress Notes (Signed)
Established Patient Office Visit  Subjective   Patient ID: Carol Perez, female    DOB: [birth year]  Age: 82 y.o. MRN: [id number]  Chief Complaint  Patient presents with   Urinary Tract Infection         HPI  She was seen for acute cystitis on April 16 and given a prescription for Bactrim.  After 2 days she started to develop eye and upper lip swelling so she discontinued the medication.  Urine culture was performed but it was not felt to be a clean-catch it grew out multiple species and they suggested recollection.  As I last saw her she was formally diagnosed with early Alzheimer's.  She does not carry the gene for Alzheimer's even though her grandmother and mother both suffered from it.  They are going to start her on one of the new infusions called Leqembi.  She is worried about the possibility of affecting her kidneys.  Also with this new diagnosis she has felt more down and depressed.  She is on Wellbutrin for mood but does not feel like it is really helping and would is open to trying something different.  She is open to therapy/counseling but right now is concerned about the cost of the infusions and so would prefer to hold off.    ROS    Objective:     BP (!) 155/70   Pulse 69   Ht  (1.549 m)   Wt 146 lb (66.2 kg)   SpO2 97%   BMI 27.59 kg/m    Physical Exam Vitals reviewed.  Constitutional:      Appearance: She is well-developed.  HENT:     Head: Normocephalic and atraumatic.  Eyes:     Conjunctiva/sclera: Conjunctivae normal.  Cardiovascular:     Rate and Rhythm: Normal rate.  Pulmonary:     Effort: Pulmonary effort is normal.  Skin:    General: Skin is dry.     Coloration: Skin is not pale.  Neurological:     Mental Status: She is alert and oriented to person, place, and time.  Psychiatric:        Behavior: Behavior normal.      Results for orders placed or performed in visit on 10/02/22  POCT URINALYSIS DIP (CLINITEK)  Result Value Ref  Range   Color, UA yellow yellow   Clarity, UA clear clear   Glucose, UA negative negative mg/dL   Bilirubin, UA negative negative   Ketones, POC UA negative negative mg/dL   Spec Grav, UA 0.865 7.846 - 1.025   Blood, UA negative negative   pH, UA 7.0 5.0 - 8.0   POC PROTEIN,UA negative negative, trace   Urobilinogen, UA 0.2 0.2 or 1.0 E.U./dL   Nitrite, UA Negative Negative   Leukocytes, UA Negative Negative      The 10-year ASCVD risk score (Arnett DK, et al., 2019) is: 34.7%    Assessment & Plan:   Problem List Items Addressed This Visit       Nervous and Auditory   Alzheimer disease    Starting new infusion for Leqembi.  Will have MR brain for monitoring.       Relevant Medications   DULoxetine (CYMBALTA) 30 MG capsule     Genitourinary   Recurrent UTI - Primary   Relevant Orders   POCT URINALYSIS DIP (CLINITEK) (Completed)   Other Visit Diagnoses     Current moderate episode of major depressive disorder without prior episode  Relevant Medications   DULoxetine (CYMBALTA) 30 MG capsule      Recurrent UTI-urinalysis is clear and looks great today but will send cultures to confirm that she has no evidence of residual UTI especially since she had to stop the antibiotic prematurely.  Major depressive disorder-discussed options we could consider increasing the Wellbutrin but she really feels like it has not had any effect and she has been on it for a while now.  We discussed the possibility of switching to an SSRI or an SNRI.  We also discussed counseling/therapy but she is concerned about the potential cost especially since the co-pays for the upcoming infusions are can to be $200 apiece.  We will discontinue Wellbutrin and taper.  Will start Cymbalta 30 mg daily.  Will see how the medication does and still encouraged her to consider therapy/counseling.  Return in about 4 weeks (around 10/30/2022) for New start medication.    Nani Gasser, MD

## 2022-10-02 NOTE — Addendum Note (Signed)
Addended by: Deno Etienne on: 10/02/2022 12:41 PM   Modules accepted: Orders

## 2022-10-02 NOTE — Patient Instructions (Addendum)
The dipstick on the urine today looks great.  But we will send a culture just to confirm that we have completely cleared up the infection.  Decrease the Wellbutrin.  Take 1 tab every other day for 8 days and then stop the medication completely. Please start the duloxetine and 1 tab daily in the morning starting tomorrow.  I did verify that there is no interaction between this new medication and your upcoming infusions

## 2022-10-02 NOTE — Assessment & Plan Note (Signed)
Starting new infusion for Leqembi.  Will have MR brain for monitoring.

## 2022-10-03 NOTE — Progress Notes (Signed)
Is this just extra just wanted to make sure it was not for the culture.

## 2022-10-04 LAB — URINE CULTURE
MICRO NUMBER:: 14856083
SPECIMEN QUALITY:: ADEQUATE

## 2022-10-04 LAB — EXTRA URINE SPECIMEN

## 2022-10-04 NOTE — Progress Notes (Signed)
Call [name] and let her know that her urine culture is negative.  No sign of UTI so it looks like the infection has completely cleared which is wonderful.  Certainly if she develops new symptoms then we will have confidence that it is a new infection and not just a continuation of this last 1.

## 2022-10-05 NOTE — Progress Notes (Signed)
Please see taper written on her AVS.  It was a taper so that she could completely stop it so I am not sure if maybe she did not quite understand the taper.

## 2022-10-05 NOTE — Progress Notes (Signed)
Okay to discontinue taper and just stop the Wellbutrin.  I really do not think it was the cause of the cramping as the medications do not interact with each other.  I would just make sure that she is hydrating well and eating a potassium rich diet.  Also if she can check her blood pressure at home and let us know if it is looking better that would be wonderful.

## 2022-10-05 NOTE — Progress Notes (Signed)
OK to stop the taper early and just quit the Welbutrin.  Personally I did think the combination because the cramping.  These medicines do not interact with each other.  I would encourage her to make sure that she is hydrating and eating potassium rich foods to reduce risk for cramps.

## 2022-10-06 ENCOUNTER — Telehealth: Payer: Self-pay | Admitting: *Deleted

## 2022-10-06 NOTE — Telephone Encounter (Signed)
Pt called and LVM with her home bp readings  1) 116/75 2) 119/72  Will fwd to Dr. Linford Arnold

## 2022-10-06 NOTE — Telephone Encounter (Signed)
These look fantastic.

## 2022-10-17 DIAGNOSIS — Z006 Encounter for examination for normal comparison and control in clinical research program: Secondary | ICD-10-CM | POA: Diagnosis not present

## 2022-10-17 DIAGNOSIS — G3184 Mild cognitive impairment, so stated: Secondary | ICD-10-CM | POA: Diagnosis not present

## 2022-10-18 ENCOUNTER — Encounter: Payer: Self-pay | Admitting: Neurology

## 2022-10-18 ENCOUNTER — Ambulatory Visit: Payer: Medicare Other | Admitting: Neurology

## 2022-10-18 VITALS — BP 119/68 | HR 83 | Ht 61.0 in | Wt 146.8 lb

## 2022-10-18 DIAGNOSIS — Z79899 Other long term (current) drug therapy: Secondary | ICD-10-CM | POA: Diagnosis not present

## 2022-10-18 DIAGNOSIS — E519 Thiamine deficiency, unspecified: Secondary | ICD-10-CM | POA: Diagnosis not present

## 2022-10-18 DIAGNOSIS — E538 Deficiency of other specified B group vitamins: Secondary | ICD-10-CM | POA: Diagnosis not present

## 2022-10-18 DIAGNOSIS — G4762 Sleep related leg cramps: Secondary | ICD-10-CM | POA: Diagnosis not present

## 2022-10-18 DIAGNOSIS — G3184 Mild cognitive impairment, so stated: Secondary | ICD-10-CM

## 2022-10-18 DIAGNOSIS — R790 Abnormal level of blood mineral: Secondary | ICD-10-CM | POA: Diagnosis not present

## 2022-10-18 DIAGNOSIS — E531 Pyridoxine deficiency: Secondary | ICD-10-CM

## 2022-10-18 DIAGNOSIS — R7303 Prediabetes: Secondary | ICD-10-CM

## 2022-10-18 DIAGNOSIS — G629 Polyneuropathy, unspecified: Secondary | ICD-10-CM

## 2022-10-18 DIAGNOSIS — G2581 Restless legs syndrome: Secondary | ICD-10-CM

## 2022-10-18 DIAGNOSIS — R6889 Other general symptoms and signs: Secondary | ICD-10-CM | POA: Diagnosis not present

## 2022-10-18 DIAGNOSIS — R2689 Other abnormalities of gait and mobility: Secondary | ICD-10-CM | POA: Diagnosis not present

## 2022-10-18 MED ORDER — PREGABALIN 50 MG PO CAPS: 50.0000 mg | ORAL_CAPSULE | Freq: Every evening | ORAL | 3 refills | Status: DC

## 2022-10-18 NOTE — Progress Notes (Addendum)
[facility]    Provider:  Dr Lucia Gaskins Requesting Provider: Agapito Games, * Primary Care Provider:  Agapito Games, MD   CC: neuropathy  04/19/2023: LOVELY patient, moving near [facility]. has been on Inger and doing well now wants to try Donanemab will [facility] to [facility]; [facility] referral, memory center, patient is on Lequembi and wants to transition to [place] but we cannot infuse that here now and she is moving near to [facility] does not want to travel back to [place].   10/18/2022: cognitive decline approved for lecanemab but here today for new cause neuropathy in the feet. PMHx has Recurrent UTI; Osteoporosis; IBS (irritable bowel syndrome); Hyperlipidemia; Back pain; Barrett's esophagus; Occipital neuralgia of right side; Lumbar scoliosis; Hyponatremia; Depression, recurrent (HCC); Atrophic vaginitis; Blood loss anemia; Nocturnal leg cramps; Overactive bladder; Alzheimer disease (HCC); FHx: Alzheimer's disease; IFG (impaired fasting glucose); Neuropathy of both feet; Dry eye; and GERD (gastroesophageal reflux disease) on their problem list.   I reviewed her chart for any neuropathy evaluation: I I tested her in 07/07/2021 for memory loss which also happens to coincide with some lab tests we ordered for serum neuropathy workup including B12, folate, MMA, B1, homocystine, TSH, BMP CBC. She always seems to have slightly elevated glucose, and she does have prediabetes from hemoglobin A1c in October 2023 5.9.  ABIs were normal. A few months ago started having restless legs, she has to constantly move them, and cramps in the feet. No numbness and tingling, no sensory changes. No low back pain or shooting into her legs. In March of 2022 she had back surgery but the symptoms do not coincide. She had lecanemab yesterday. Will increase lyrica for restless legs and cramps at bedtime. Check ferritin level.   Patient complains of symptoms per HPI as well as  the following symptoms: sleepy  . Pertinent negatives and positives per HPI. All others negative   09/05/2022: Patient here for follow up on testing. She did not have an ApoE4 gene (e3/e3); discussed lecanumab: Patient's been diagnosed with mild cognitive impairment  PET scan showed amyloid in the brain, markers were positive in the blood for Alzheimer's disease, we performed a Moca today, MMSE, filled out all the paperwork for Lecanemab, extended visit took myself over 70 minutes today to discuss Lecantemab and fill out all the paperwork and answer all questions.  I also performed. functional assessment scale today.  We discussed the following and more, I provided literature:   The main challenge will be arranging the follow up MRIs (baseline, before 5, 7, 14th infusions).  We checked with our infusion suite and with the team and eventually found out MRI in October will be fine for the baseline MRI.  Below is directly from the literature.  I provided more literature.  They understand the risks especially the increased bleeding since patient has a significant burden of amyloid plaque in her brain and is homozygote for you for a e4 Alzheimer's gene she is at a much higher rate of Aria up to 45%.  "Incidence of ARIA Symptomatic ARIA occurred in 3% (29/898) of patients treated with [facility] in Study 2 [see Clinical Studies (14)]. Serious symptoms associated with ARIA were reported in 0.7% (6/898) of patients treated with LEQEMBI. Including asymptomatic radiographic events, ARIA was observed in 21% (191/898) of patients treated with [facility], compared to 9% (40/981) of patients on placebo in Study 2   ApoE e4 Carrier Status and Risk of ARIA Approximately 15% of Alzheimer's  disease patients are ApoE e4 homozygotes.   The incidence of ARIA was higher in ApoE e4 homozygotes (45% on LEQEMBI vs. 22% on placebo) than in heterozygotes (19% on LEQEMBI vs 9% on placebo) and noncarriers (13% on LEQEMBI vs 4% on  placebo). Among patients treated with LEQEMBI, symptomatic ARIA-E occurred in 9% of ApoE e4 homozygotes compared with 2% of heterozygotes and 1% noncarriers. Serious events of ARIA occurred in 3% of ApoE e4 homozygotes, and approximately 1% of heterozygotes and noncarriers.  The recommendations on management of ARIA do not differ between ApoE e4 carriers and noncarriers [see Dosage and Administration (2.3)]. Testing for ApoE e4 status should be performed prior to initiation of treatment to inform the risk of developing ARIA. Prior to testing, prescribers should discuss with patients the risk of ARIA across genotypes and the implications of genetic testing results. Prescribers should inform patients that if genotype testing is not performed they can still be treated with [facility]; however, it cannot be determined if they are ApoE e4 homozygotes and at higher risk for ARIA. An FDA-authorized test for the detection of ApoE e4 alleles to identify patients at risk of ARIA if treated with [facility] is not currently available. Currently available tests used to identify ApoE e4 alleles may vary in accuracy and design."    PET Amyloid: FINDINGS: Findings: There is diffusely increased florbetapir uptake seen in the cortical cerebral gray matter of the temporal, frontal, occipital, and parietal lobes, with these regions showing clear loss of the normal gray-white contrast. The cerebellum has no evidence of abnormal uptake.   IMPRESSION: IMPRESSION   The scan is positive for brain amyloid and is most consistent with the presence of moderate to frequent neuritic beta-amyloid plaques in the brain.  Dx: Alzheimer disease (HCC); MCI (mild co...   2 Result Notes     1 Patient Communication     2 Follow-up Encounters    Component Ref Range & Units 2 mo ago  A -- Beta-amyloid 42/40 Ratio >0.102 0.089 Low   Beta-amyloid 42 pg/mL 21.89  Beta-amyloid 40 pg/mL 245.80  T -- p-tau181 0.00 - 0.97 pg/mL 1.72  High   N -- NfL, Plasma 0.00 - 7.64 pg/mL 3.40  ATN SUMMARY Comment  Comment:                        A+ T+ N- A low beta-amyloid 42/40 and a high pTau181 concentration were observed. A normal NfL concentration was observed at this time. These results are consistent with the presence of Alzheimer's related pathology. Plasma findings may be less precise than CSF or PET. Additional assessments may be necessary. These tests are intended to be used in the context of clinical care.  Information: Comment  Comment: Beta-amyloid 42 and Beta-amyloid 40: Plasma beta-amyloid 1-42/1-40 ratios less than or equal to 0.102 suggest a higher probability of a patient being clinically diagnosed with Alzheimer's Disease (AD), while values above 0.102 suggest a lower probability of AD diagnosis. Precise plasma testing of Beta-amyloid 42 and Beta-amyloid 40 has demonstrated comparable effectiveness to traditional cerebrospinal fluid testing and amyloid positron emission tomography (PET) scans. When assessing the risk of AD pathology as the underlying cause for mild cognitive impairment (MCI) or dementia, it is important to consider various factors such as medical and family history, nutritional deficiency biomarkers, neuroimaging, and physical, neurological, and neuropsychological examinations. These tests were developed and their performance characteristics determined by [facility]. They have not been cleared or approved by  the Food and Drug Administration.      07/03/2022: Patient reports Short term memory loss. Bad direction sense. She has trouble coming up with words. She is forgetting conversations but it comes back. APoE4 was E3/E3. Had diarrhea on Ariceot. Started Namenda and started having dizziness and no ear infection she decreased the nemenda and I have advised her to stop it because dizziness still occurring. STOP NAMENDA. Has not tried the adlarity yet. FDG PET Scan was denied.   Patient complains  of symptoms per HPI as well as the following symptoms: memory loss . Pertinent negatives and positives per HPI. All others negative   05/15/2022: This is a patient we have been following for patient reported memory loss.  Last time I saw her was in April, she had formal memory testing completed, largely inconclusive, she was diagnosed with mild cognitive impairment amnestic type and she has a long family history of Alzheimer's with the possibility this could be prodromal Alzheimer's.  She is here for follow-up, SHe had an episode of confusion likely due to UTI. Could not tolerate aricept, diarrhea. Other anticholinestase medications likely to do the same. Difficulty swallowing pills.  We discussed restarting Aricept patch, other medications such as Namenda, we also had a very long conversation about the new monoclonal antibody that is on the market just approved by the FDA in July, we also talked about melatonin which is fine she has had a problem with sleeping, she wakes up with dry mouth, she snores.  We also talked about FDG PET scan and PET amyloid scan.  I answered all questions. Patient complains of symptoms per HPI as well as the following symptoms: none . Pertinent negatives and positives per HPI. All others negative  09/12/2021: Here for followup after formal memory testing completed.  Memory testing was largely inconclusive, I reviewed Dr. McDermott's notes with patient, but patient never followed back up with Dr. Jacquelyne Balint so that he could review all the testing with her which I highly recommend and asked them to call the office.  She was diagnosed with mild cognitive impairment amnestic type, she has a long family history of Alzheimer's, could be prodromal Alzheimer's at this time unclear.  She will need to undergo a repeat likely in a year if her symptoms worsen, in the meantime given the uncertainty of her memory loss I am going to recommend an FDG PET scan which is highly sensitive and specific to  Alzheimer's versus frontotemporal dementia.  No suspicion for vascular dementia given her MRI of the brain.  Anesthesia and normal cognitive aging could be contributory, but although she was diagnosed with MCI: "Test results revealed intact functioning across most cognitive domains and thinking skills assessed during this evaluation with the exception of exceptionally low performance on certain aspects of memory (i.e. new learning and free recall of contextual information and visually presented material), but with intact verbal free recall and recognition. Other intact areas included executive functions, attention and processing speed, language, and visuospatial judgment. From an emotional standpoint, on self-report measures, there appears to be at least a mild degree of recent depression.  Ms. Kildow performance evidences mild neurocognitive challenges primarily manifesting as difficulties with certain aspects of learning and memory. At present, an exact etiology is unclear, though we have to entertain the possibility of a prodromal neurodegenerative process but nothing has been substantiated of yet. It is possible that the surgery and subsequent complications could be playing a role (or at least contributing) but further follow-up and  monitoring is recommended  REFERRING DIAGNOSIS:  Memory changes   FINAL DIAGNOSIS (ICD-10 considerations):  Mild Neurocognitive Disorder (i.e., mild cognitive impairment), amnestic (etiology unclear)"   06/28/2021: She has major low back surgery in 2022 and she was under anesthesia for a long time. She feels her memory is impaired since, she can't remember names, she can't remember if she took her pills. She hs alzheimer's and strokes in her family. She is perfoeming hr own IADLs and ADLs. Her husband has to check your bills. She drives fine. No accidents in the home, people are noticing hr husband. She is having word-finding difficulty.   MRI brain 06/2021: IMPRESSION:  personally reviewed and agree with findings (addition 10 minutes to appointment)   MRI brain (with and without) demonstrating: - Stable, mild periventricular and subcortical foci of nonspecific T2 hyperintensities.  No abnormal lesions are seen on post contrast views.   - No acute findings.  Labs 07/07/2021: B12 folate b1, mma, homocysteine, tsh unremarkable   Patient complains of symptoms per HPI as well as the following symptoms: MCI . Pertinent negatives and positives per HPI. All others negative    HPI:  KASARA SCHOMER is a 82 y.o. female here as requested by Agapito Games, * for head pain. She woke up one morning with pain in the right scalp. Impulses. They got severe that evening, she couldn't sleep, she tried CBD and melatonin, shooting pain behind the ear, not burning, she took Gabapentin (she had some for her low back pain and sciatica). She has seen a neurosurgeon and he was worried about her neck. She has a lot of neck pain. The pain is gone. The symptoms are better and she has not had it for a few days. Her low back is better. But she has a lot of neck problems, she has seen a neurosurgeon in the past. She exercises at least 5 morning a week, she exercises her neck. She has tightness in her neck. She has very tight neck muscles.   Reviewed notes, labs and imaging from outside physicians, which showed:  Personally reviewed images of MRI of the brain and MRI trigeminal protocol, essentially normal for age, some very minimal white matter changes, no vascular compression.  Bmp,cbc unremarkable  Reviewed notes from emergency room visit where she was seen for right temporal sharp pain which does not radiate she reported 7 out of 10 pain which was sudden and lasting for 2 days waxing and waning no worsening to bright light coughing or straining, no other associated symptoms, examination and neurologic examination were unremarkable and nonfocal.  She did not have tenderness in the area,  her pains could not be provoked, there were no rashes or focal deficits on exam, normal gait, they were concerned for trigeminal neuralgia.  She was given gabapentin 3 times daily.  Review of Systems: Patient complains of symptoms per HPI as well as the following symptoms: memory loss . Pertinent negatives and positives per HPI. All others negative    Social History   Socioeconomic History   Marital status: Married    Spouse name: Not on file   Number of children: 2   Years of education: Not on file   Highest education level: Associate degree: occupational, Scientist, product/process development, or vocational program  Occupational History   Occupation: Retired   Tobacco Use   Smoking status: Former    Types: Cigarettes    Start date: 06/12/1977   Smokeless tobacco: Never   Tobacco comments:  42 years ago. Smoked from age [age]  Vaping Use   Vaping Use: Never used  Substance and Sexual Activity   Alcohol use: Not Currently    Comment: occ   Drug use: No   Sexual activity: Not on file  Other Topics Concern   Not on file  Social History Narrative   Tobacco use, amount per day now: No   Past tobacco use, amount per day: Smoked ages [age]   How many years did you use tobacco: 17-20 years   Alcohol use (drinks per week): 1 glass of Red Wine per day.   Diet: Good   Do you drink/eat things with caffeine: No   Marital status: Married                                  What year were you married? 1978   Do you live in a house, apartment, assisted living, condo, trailer, etc.? Apartment/Independent Living   Is it one or more stories? 1   How many persons live in your home? 2    Do you have pets in your home?( please list) No   Highest Level of Education completed? 2 years of college.    Current or past profession: Retired Network engineer   Do you exercise?  Yes                                Type and how often? workout 5 days week. Walking 3-4 Days a Week.    Do you have a living will? Yes   Do you  have a DNR form?  Yes                                 If not, do you want to discuss one?   Do you have signed POA/HPOA forms? Yes                       If so, please bring to you appointment      Do you have difficulty bathing or dressing yourself? No   Do you have difficulty preparing food or eating? No   Do you have difficulty managing your medications? No   Do you have difficulty managing your finances? No   Do you have difficulty affording your medications? No       Per [facility] New Patient Packet. Abstracted by Jasmine/RMA.    Social Determinants of Health   Financial Resource Strain: Not on file  Food Insecurity: Not on file  Transportation Needs: Not on file  Physical Activity: Not on file  Stress: Not on file  Social Connections: Not on file  Intimate Partner Violence: Not on file    Family History  Problem Relation Age of Onset   Cancer Mother    Dementia Mother    Alzheimer's disease Mother    Cancer Father    Alzheimer's disease Maternal Grandmother    Dementia Maternal Grandmother    Alzheimer's disease Maternal Grandfather    Neuropathy Neg Hx     Past Medical History:  Diagnosis Date   Barrett's esophagus    Colitis    DDD (degenerative disc disease)    Factor 5 Leiden mutation, heterozygous [facility])    Per [facility] New Patient Packet.  Frequent UTI    H/O echocardiogram 04/01/2019   Per [facility] New Patient Packet.   Hiatal hernia    Per [facility] New Patient Packet.   High grade dysplasia of Barrett's epithelium    Per [facility] New Patient Packet.   History of bladder infections    Per [facility] New Patient Packet.   History of Papanicolaou smear of cervix 10/16/2011   Per [facility] New Patient Packet   Hypoglycemia    Hypotension    Per [facility] New Patient Packet.   IBS (irritable bowel syndrome)    Per [facility] New Patient Packet.   Lactose intolerance    Osteoarthritis    Per [facility] New Patient Packet.   Osteopenia    Per [facility] New Patient Packet.   Osteoporosis    Premature ventricular  complex    Raynaud's disease    Per [facility] New Patient Packet.   Scoliosis    Per [facility] New Patient Packet.    Patient Active Problem List   Diagnosis Date Noted   IFG (impaired fasting glucose) 07/10/2022   Neuropathy of both feet 07/10/2022   Dry eye 07/10/2022   Alzheimer disease (HCC) 05/16/2022   FHx: Alzheimer's disease 05/16/2022   Nocturnal leg cramps 11/23/2021   Overactive bladder 11/23/2021   Depression, recurrent (HCC) 08/24/2020   Atrophic vaginitis 08/24/2020   Blood loss anemia 08/24/2020   Hyponatremia    Lumbar scoliosis 08/12/2020   Occipital neuralgia of right side 09/08/2019   Recurrent UTI 06/26/2019   Osteoporosis 06/26/2019   IBS (irritable bowel syndrome) 06/26/2019   Hyperlipidemia 06/26/2019   Back pain 06/26/2019   Barrett's esophagus 06/26/2019   GERD (gastroesophageal reflux disease) 05/01/2019    Past Surgical History:  Procedure Laterality Date   ABDOMINAL EXPOSURE N/A 08/12/2020   Procedure: ABDOMINAL EXPOSURE;  Surgeon: Nada Libman, MD;  Location[facility] OR;  Service: Vascular;  Laterality: N/A;   ABDOMINAL HYSTERECTOMY  06/13/2011   By Dr.Scherer at [facility]. Per [facility] New Patient Packet.   ANTERIOR LAT LUMBAR FUSION Right 08/12/2020   Procedure: Right Lumbar two-three, Lumbar three-four, Lumbar four-five Anterolateral lumbar interbody fusion;  Surgeon: Maeola Harman, MD;  [facility];  Service: Neurosurgery;  Laterality: Right;   ANTERIOR LUMBAR FUSION N/A 08/12/2020   Procedure: Lumbar five Sacral one Anterior lumbar interbody fusion;  Surgeon: Maeola Harman, MD;  Location: [facility] OR;  Service: Neurosurgery;  Laterality: N/A;   BREAST EXCISIONAL BIOPSY Left 30+ yrs ago   COLONOSCOPY  11/23/2011   By Dr.McCune at [facility]. Per [facility] New Patient Packet.   COLONOSCOPY  08/26/2019   LUMBAR PERCUTANEOUS PEDICLE SCREW 4 LEVEL N/A 08/12/2020   Procedure: Percutaneous pedicle screw fixation from Lumbar two to Sacral one ;  Surgeon:  Maeola Harman, MD;  Location[facility] OR;  Service: Neurosurgery;  Laterality: N/A;   MAMMOGRAM  04/14/2019   pap smear  10/16/2011   SIGMOIDOSCOPY  11/02/2014   Per [facility] New Patient Packet   TONSILLECTOMY  06/12/1946   Per [facility] New Patient Packet.    Current Outpatient Medications  Medication Sig Dispense Refill   aspirin EC 81 MG tablet Take 81 mg by mouth 3 (three) times daily.     atorvastatin (LIPITOR) 10 MG tablet TAKE ONE (1) TABLET BY MOUTH EACH DAY 90 tablet 1   Bismuth Subsalicylate (PEPTO-BISMOL PO) Take by mouth as needed.     buPROPion (WELLBUTRIN XL) 150 MG 24 hr tablet TAKE ONE (1) TABLET BY MOUTH EVERY DAY 30 tablet  3   CALCIUM PO Take 1,200 mg by mouth daily.      cholecalciferol (VITAMIN D3) 25 MCG (1000 UNIT) tablet Take 3,000 Units by mouth daily.     conjugated estrogens (PREMARIN) vaginal cream Place 1 applicator vaginally once a week.     DULoxetine (CYMBALTA) 30 MG capsule Take 1 capsule (30 mg total) by mouth daily. 30 capsule 1   methenamine (HIPREX) 1 g tablet Take 1 g by mouth 2 (two) times daily with a meal. For UTI Prevention. Last UTI: October 20th, November 5th, and December 28th.     Multiple Vitamin (MULTIVITAMIN WITH MINERALS) TABS tablet Take 1 tablet by mouth daily. One A Day for Women     Omeprazole-Sodium Bicarbonate (ZEGERID) 20-1100 MG CAPS capsule Take 1 capsule by mouth 2 (two) times daily.     OVER THE COUNTER MEDICATION Place 1 drop into both ears as needed. Grapefruit Seed Extract Oil     Polyethyl Glycol-Propyl Glycol (SYSTANE) 0.4-0.3 % SOLN Place 1 drop into both eyes as needed (dry/irritated eyes).     pregabalin (LYRICA) 50 MG capsule Take 1 capsule (50 mg total) by mouth at bedtime. 90 capsule 3   Probiotic Product (PROBIOTIC COLON SUPPORT PO) Take 1 tablet by mouth in the morning and at bedtime.     psyllium (METAMUCIL) 58.6 % powder Take 1 packet by mouth 3 (three) times daily.     valACYclovir (VALTREX) 500 MG tablet Take 1 tablet (500 mg  total) by mouth daily as needed. 270 tablet 0   No current facility-administered medications for this visit.    Allergies as of 10/18/2022 - Review Complete 10/18/2022  Allergen Reaction Noted   Banana Swelling 09/08/2019   Latex Swelling, Rash, and Other (See Comments) 01/14/2011   Ciprofloxacin hcl Other (See Comments) 08/03/2013   Gatifloxacin Other (See Comments) 08/03/2013   Levofloxacin Other (See Comments) 08/03/2013   Moxifloxacin Other (See Comments) 08/03/2013   Norfloxacin Other (See Comments) 08/03/2013   Nsaids Other (See Comments) 01/14/2011   Ofloxacin Other (See Comments) 08/03/2013   Penicillins Hives 01/14/2011   Bactrim [sulfamethoxazole-trimethoprim] Swelling 10/02/2022   Donepezil  08/07/2022   Methylisothiazolinone Rash and Other (See Comments) 04/12/2017   Tape Rash 04/12/2017    Vitals: BP 119/68   Pulse 83   Ht 5\' 1"  (1.549 m)   Wt 146 lb 12.8 oz (66.6 kg)   BMI 27.74 kg/m  Last Weight:  Wt Readings from Last 1 Encounters:  10/18/22 146 lb 12.8 oz (66.6 kg)   Last Height:   Ht Readings from Last 1 Encounters:  10/18/22 5\' 1"  (1.549 m)    Exam: NAD, pleasant                  Speech:    Speech is normal; fluent and spontaneous with normal comprehension.  Cognition:     09/05/2022    2:17 PM 05/15/2022   11:05 AM 03/28/2022    2:51 PM  MMSE - Mini Mental State Exam  Orientation to time 4 5 3   Orientation to Place 5 5 5   Registration 3 3 3   Attention/ Calculation 3 2 5   Recall 1 3 3   Language- name 2 objects 2 2 2   Language- repeat 1 1 1   Language- follow 3 step command 3 3 3   Language- read & follow direction 1 1 1   Write a sentence 1 1 1   Copy design 1 1 1   Total score 25 27 28  09/05/2022    2:23 PM  Montreal Cognitive Assessment   Visuospatial/ Executive (0/5) 5  Naming (0/3) 3  Attention: Read list of digits (0/2) 2  Attention: Read list of letters (0/1) 1  Attention: Serial 7 subtraction starting at 100 (0/3) 3   Language: Repeat phrase (0/2) 2  Language : Fluency (0/1) 1  Abstraction (0/2) 2  Delayed Recall (0/5) 3  Orientation (0/6) 6  Total 28       The patient is oriented to person, place, and time;     recent and remote memory intact;     language fluent;    Cranial Nerves:    The pupils are equal, round, and reactive to light.Trigeminal sensation is intact and the muscles of mastication are normal. The face is symmetric. The palate elevates in the midline. Hearing intact. Voice is normal. Shoulder shrug is normal. The tongue has normal motion without fasciculations.   Coordination:  No dysmetria  Motor Observation:    No asymmetry, no atrophy, and no involuntary movements noted. Tone:    Normal muscle tone.     Strength:    Strength is V/V in the upper and lower limbs.      Sensation: intact to LT, pin prick, vibration distally in the feet    Assessment/Plan: This is a really lovely 82 year old with memory loss. MRI unremarkable for age, EEG. Formal memory tsting with Dr. 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 Patient here for follow up on testing. She did not have an ApoE4 gene (e3/e3); discussed lecanumab: Patient's been diagnosed with mild cognitive impairment,  PET scan showed amyloid in the brain, markers were positive in the blood for Alzheimer's disease, we performed a Moca today, MMSE, filled out all the paperwork for Lecanemab, extended visit took myself over 70 minutes today to discuss Lecantemab and fill out all the paperwork and answer all questions.  I also performed. functional assessment scale today. Need MRI brain within the last year as a baseline for lecanemab and her worsening cognition, will order.   04/19/2023: LOVELY patient, moving near [facility]. has been on Padroni and doing well now wants to try Donanemab will [facility] to [facility]; [facility] referral, memory center, patient is  on Lequembi and wants to transition to [place] but we cannot infuse that here now and she is moving near to [facility] does not want to travel back to [place].   RLS:  10/18/2022: Patient's sensory exam was normal, sent for neuropathy but sounds like restless legs. ABIs were normal. A few months ago started having restless legs, she has to constantly move them, and cramps in the feet. No numbness and tingling, no sensory changes. No low back pain or shooting into her legs. She had lecanemab yesterday. Will increase lyrica for restless legs and cramps at bedtime. Check ferritin level and will also for completeness check some neuropathy serum labs. She had her first lecanemab yesterday, see below for whole workup and information, this is a different chief complaint.  Orders Placed This Encounter  Procedures   Iron, TIBC and Ferritin Panel   B12 and Folate Panel   Methylmalonic acid, serum   Hemoglobin A1c   Multiple Myeloma Panel (SPEP&IFE w/QIG)   Vitamin B1   Vitamin B6   Meds ordered this encounter  Medications   pregabalin (LYRICA) 50 MG capsule    Sig: Take 1 capsule (50 mg total) by mouth at bedtime.  Dispense:  90 capsule    Refill:  3   Dementia/MCI:  1.Did not tolerate oral aricept. Memantine BID made her dizzy.   2. On lecanemab  We discussed the following and more, I provided literature:   The main challenge will be arranging the follow up MRIs (baseline, before 5, 7, 14th infusions).  We checked with our infusion suite and with the team and eventually found out MRI in October will be fine for the baseline MRI.  Below is directly from the literature.  I provided more literature.  They understand the risks especially the increased bleeding since patient has a significant burden of amyloid plaque in her brain and is homozygote for you for a e4 Alzheimer's gene she is at a much higher rate of Aria up to 45%.  "Incidence of ARIA Symptomatic ARIA occurred in 3% (29/898) of  patients treated with [facility] in Study 2 [see Clinical Studies (14)]. Serious symptoms associated with ARIA were reported in 0.7% (6/898) of patients treated with LEQEMBI. Including asymptomatic radiographic events, ARIA was observed in 21% (191/898) of patients treated with [facility], compared to 9% (56/213) of patients on placebo in Study 2   ApoE e4 Carrier Status and Risk of ARIA Approximately 15% of Alzheimer's disease patients are ApoE e4 homozygotes.   The incidence of ARIA was higher in ApoE e4 homozygotes (45% on LEQEMBI vs. 22% on placebo) than in heterozygotes (19% on LEQEMBI vs 9% on placebo) and noncarriers (13% on LEQEMBI vs 4% on placebo). Among patients treated with LEQEMBI, symptomatic ARIA-E occurred in 9% of ApoE e4 homozygotes compared with 2% of heterozygotes and 1% noncarriers. Serious events of ARIA occurred in 3% of ApoE e4 homozygotes, and approximately 1% of heterozygotes and noncarriers.  The recommendations on management of ARIA do not differ between ApoE e4 carriers and noncarriers [see Dosage and Administration (2.3)]. Testing for ApoE e4 status should be performed prior to initiation of treatment to inform the risk of developing ARIA. Prior to testing, prescribers should discuss with patients the risk of ARIA across genotypes and the implications of genetic testing results. Prescribers should inform patients that if genotype testing is not performed they can still be treated with [facility]; however, it cannot be determined if they are ApoE e4 homozygotes and at higher risk for ARIA. An FDA-authorized test for the detection of ApoE e4 alleles to identify patients at risk of ARIA if treated with [facility] is not currently available. Currently available tests used to identify ApoE e4 alleles may vary in accuracy and design.   Reviewed the following with patient and husband from dr. Mcdermott's notes:  SUMMARY & IMPRESSION: Mrs. Leatrice Parilla is a 82 year old, right-handed, White  female, who reported experiencing mild cognitive challenges that began within the past 2 years and were exacerbated approximately 1 year ago by a major spinal surgery that took place (March 2022). She reported some complications following the surgery that prolonged her hospitalization to include hyponatremia and pneumonia. While she feels that she has improved to a degree, she has yet to return to baseline.  Test results revealed intact functioning across most cognitive domains and thinking skills assessed during this evaluation with the exception of exceptionally low performance on certain aspects of memory (i.e. new learning and free recall of contextual information and visually presented material), but with intact verbal free recall and recognition. Other intact areas included executive functions, attention and processing speed, language, and visuospatial judgment. From an emotional standpoint, on self-report measures, there appears to  be at least a mild degree of recent depression.  Ms. Treese performance evidences mild neurocognitive challenges primarily manifesting as difficulties with certain aspects of learning and memory. At present, an exact etiology is unclear, though we have to entertain the possibility of a prodromal neurodegenerative process but nothing has been substantiated of yet. It is possible that the surgery and subsequent complications could be playing a role (or at least contributing) but further follow-up and monitoring is recommended  REFERRING DIAGNOSIS:  Memory changes   FINAL DIAGNOSIS (ICD-10 considerations):  Mild Neurocognitive Disorder (i.e., mild cognitive impairment), amnestic (etiology unclear)  RECOMMENDATIONS: 1. Follow-up with Dr. Lucia Gaskins.   Could consider treatment with cholinesterase inhibitor.   It is recommended that the patient aggressively manage any modifiable risk factors for further cognitive decline such as strict compliance with prescribed medical  treatments for any cerebrovascular risk factors (e.g., high cholesterol, high blood pressure, sleep apnea, diabetes).  Follow-up with the [facility] at [facility] is recommended in 12 months for educational purposes, psychosocial concerns, and neurocognitive testing as necessary. If you wish to make this follow-up appointment, please contact our office at [phone].  2. In response to the growing number of families affected by memory loss and dementia, the Memory Counsel[facility] Program (MCP) was established in 2011 with the support of the Section on Gerontology and Geriatric Medicine at [facility]. The MCP provides counseling services for individuals diagnosed with mild cognitive impairment (MCI), Alzheimer's disease, or another form of dementia, as well as to their family members. Services include individual, couple, and family counseling, as well as support groups, all of which provide a safe environment to talk about the journey with mild cognitive impairment or dementia, learn as much as possible about the disease, problem solve some of the common challenges encountered with memory and cognitive loss, and strengthen relationships.  The MCP is staffed by licensed practitioners. All services are rendered in a professional manner consistent with the ethical standards of the American Counseling Association and the [employer].  The MCP is located in the [facility] on the [facility]. In general, we see clients every few weeks or monthly, depending on the need. Sessions are typically 45-60 minutes long, scheduled at mutually agreed upon times. Referrals can be made by a health care professional or by self-referral.   Contact: [phone]  3. The patient is encouraged to attend to lifestyle factors for brain health (e.g., regular physical exercise, good nutrition habits,  regular participation in cognitively-stimulating activities, and general stress management techniques), which are likely to have benefits for both emotional adjustment and cognition. In fact, in addition to promoting general good health, regular exercise incorporating aerobic activities (e.g., brisk walking, jogging, bicycling, etc.) has been demonstrated to be a very effective treatment for depression and stress, with similar efficacy rates to both antidepressant medication and psychotherapy. And for those with orthopedic issues, water aerobics may be particularly beneficial.  4. Nutritional factors can have a significant effect on psychological and emotional status, as well as overall brain functioning. The following general recommendations have been associated with improvements in depression and other psychological symptoms, as well as lower risk for dementia and other forms of cognitive impairment. Please discuss these recommendations with your physician and/or dietitian before initiating:   Consume a wide variety of fresh fruits and vegetables, particularly including brightly colored items such as berries, oranges, tomatoes, peppers, carrots, broccoli, spinach,  dark green lettuces, sweet potatoes, etc., all of which are high in vitamins and antioxidants.   Consume foods that are high in fiber, such as legumes (e.g., beans, peas, lentils) and foods made from whole grains (e.g., whole wheat bread and pasta)   Consume a significant amount of omega-3 essential fats and oils. These can be found in natural food sources such as salmon and other fatty fish, and also products made from flax seed and flax seed oil. Alternately, dietary supplementation with fish oil capsules and flax seed oil capsules is a good way to boost one's level of omega-3 consumption. It is important to check with your doctor before taking these supplements, especially if you take blood thinning medication.   If you do not already do so,  consider taking a quality multivitamin supplement under the guidance of your primary care physician.    Consider keeping consumption of the following foods to a minimum: 1) foods made from white flour and white sugar; 2) artificial sweeteners; 3) deep-fried foods; 4) animal fat other than fish; 5) any foods containing "hydrogenated" or "trans" fats; 6) most other types of highly processed packaged/prepared foods.   5. Try to keep in mind that common word finding errors are not necessarily the start of a dreadful decline. Over-focusing on these errors can contribute to further distraction and emotions that can detract from effective retrieval of words; this in turn, can lead to greater distress and more difficulties with recalling the specific word you were looking for to begin with.   The following are several strategies that may help:   Performance will generally be best in a structured, routine, and familiar environment, as opposed to situations involving complex problems.   Designate a place to keep your keys, wallet, cell phone, and other personal belongings.  Take time to register and process information to be remembered. Deeper encoding of information can be gained by forming a mental picture, making meaningful associations, connecting new information to previously learned and related information, paraphrasing and repetition.   To the extent possible, multitasking should be avoided; break down tasks into smaller steps to help get started and to keep from feeling overwhelmed. And if there are difficulties in organization and planning, maintaining a daily organizer to help keep track of important appointments and information may be beneficial.   Memory problems may at least be minimally addressed using compensatory strategies such as the use of a daily schedule to follow, memos, portable recorder, a centrally located bulletin board, or memory notebook. A large calendar, placed in a highly visible  location would be valuable to keep track of dates and appointments. In addition, it would be helpful to keep a log of all of medical appointments with the name of the doctor, date of visit, diagnoses, and treatments.   Use of a medication box is recommended to ensure compliance and decrease confusion regarding medication dosages, times, and dates.  To aid in attention, the patient may consider using some of the following strategies: o The patient should simplify tasks. There may be a need to break overly complex activities into simple step-by-step tasks, keep these steps written down in a note book and then check them off as they are completed which will help to stay on task and make sure the whole task is finished.  o The patient should set deadlines for everything, even for seemingly small tasks, prioritize time-sensitive tasks and write down every assignment, message, or important thought. o The patient is encouraged to  use timers and alarms to stay on track and take breaks at regular intervals. Avoid piles of paperwork or procrastination by dealing with each item as it comes in.      Cc: Rutherford Limerick, MD  Naomie Dean, MD  [facility] [address] [place], [place] [address] Phone [phone] Fax [phone]  I spent over 45 minutes of face-to-face and non-face-to-face time with patient on the  1. Restless legs   2. Sleep related leg cramps   3. Prediabetes   4. screen for Vitamin B1 deficiency   5. screen for Vitamin B6 deficiency   6. screen for Low ferritin   7. screen for Neuropathy   8. B12 deficiency     diagnosis.  This included previsit chart review, lab review, study review, order entry, electronic health record documentation, patient education on the different diagnostic and therapeutic options, counseling and coordination of care, risks and benefits of management, compliance, or risk factor  reduction

## 2022-10-18 NOTE — Patient Instructions (Addendum)
Increase lyrica at bedtime to 100mg  Blood work Follow up in 6 months  Restless Legs Syndrome Restless legs syndrome is a condition that causes uncomfortable feelings or sensations in the legs, especially while sitting or lying down. The sensations usually cause an overwhelming urge to move the legs. The arms can also sometimes be affected. The condition can range from mild to severe. The symptoms often interfere with a person's ability to sleep. What are the causes? The cause of this condition is not known. What increases the risk? The following factors may make you more likely to develop this condition: Being older than 50. Pregnancy. Being a woman. In general, the condition is more common in women than in men. A family history of the condition. Having iron deficiency. Overuse of caffeine, nicotine, or alcohol. Certain medical conditions, such as kidney disease, Parkinson's disease, or nerve damage. Certain medicines, such as those for high blood pressure, nausea, colds, allergies, depression, and some heart conditions. What are the signs or symptoms? The main symptom of this condition is uncomfortable sensations in the legs, such as: Pulling. Tingling. Prickling. Throbbing. Crawling. Burning. cramps Usually, the sensations: Affect both sides of the body. Are worse when you sit or lie down. Are worse at night. These may make it difficult to fall asleep. Make you have a strong urge to move your legs. Are temporarily relieved by moving your legs or standing. The arms can also be affected, but this is rare. People who have this condition often have tiredness during the day because of their lack of sleep at night. How is this diagnosed? This condition may be diagnosed based on: Your symptoms. Blood tests. In some cases, you may be monitored in a sleep lab by a specialist (a sleep study). This can detect any disruptions in your sleep. How is this treated? This condition is  treated by managing the symptoms. This may include: Lifestyle changes, such as exercising, using relaxation techniques, and avoiding caffeine, alcohol, or tobacco. Iron supplements. Medicines. Parkinson's medications may be tried first. Anti-seizure medications can also be helpful. Follow these instructions at home: General instructions Take over-the-counter and prescription medicines only as told by your health care provider. Use methods to help relieve the uncomfortable sensations, such as: Massaging your legs. Walking or stretching. Taking a cold or hot bath. Keep all follow-up visits. This is important. Lifestyle     Practice good sleep habits. For example, go to bed and get up at the same time every day. Most adults should get 7-9 hours of sleep each night. Exercise regularly. Try to get at least 30 minutes of exercise most days of the week. Practice ways of relaxing, such as yoga or meditation. Avoid caffeine and alcohol. Do not use any products that contain nicotine or tobacco. These products include cigarettes, chewing tobacco, and vaping devices, such as e-cigarettes. If you need help quitting, ask your health care provider. Where to find more information [employer] of Neurological Disorders and Stroke: [employer] Contact a health care provider if: Your symptoms get worse or they do not improve with treatment. Summary Restless legs syndrome is a condition that causes uncomfortable feelings or sensations in the legs, especially while sitting or lying down. The symptoms often interfere with your ability to sleep. This condition is treated by managing the symptoms. You may need to make lifestyle changes or take medicines. This information is not intended to replace advice given to you by your health care provider. Make sure you discuss any questions you  have with your health care provider. Document Revised: 01/09/2021 Document Reviewed: 01/09/2021 Elsevier Patient  Education  2023 [employer].

## 2022-10-19 DIAGNOSIS — K2270 Barrett's esophagus without dysplasia: Secondary | ICD-10-CM | POA: Diagnosis not present

## 2022-10-19 DIAGNOSIS — K52832 Lymphocytic colitis: Secondary | ICD-10-CM | POA: Diagnosis not present

## 2022-10-23 DIAGNOSIS — H16223 Keratoconjunctivitis sicca, not specified as Sjogren's, bilateral: Secondary | ICD-10-CM | POA: Diagnosis not present

## 2022-10-25 ENCOUNTER — Ambulatory Visit: Payer: Medicare Other | Admitting: Family Medicine

## 2022-10-29 LAB — MULTIPLE MYELOMA PANEL, SERUM
Albumin SerPl Elph-Mcnc: 3.4 g/dL (ref 2.9–4.4)
Albumin/Glob SerPl: 1.2 (ref 0.7–1.7)
Alpha 1: 0.3 g/dL (ref 0.0–0.4)
Alpha2 Glob SerPl Elph-Mcnc: 0.8 g/dL (ref 0.4–1.0)
B-Globulin SerPl Elph-Mcnc: 1 g/dL (ref 0.7–1.3)
Gamma Glob SerPl Elph-Mcnc: 0.8 g/dL (ref 0.4–1.8)
Globulin, Total: 2.9 g/dL (ref 2.2–3.9)
IgA/Immunoglobulin A, Serum: 191 mg/dL (ref 64–422)
IgG (Immunoglobin G), Serum: 795 mg/dL (ref 586–1602)
IgM (Immunoglobulin M), Srm: 65 mg/dL (ref 26–217)
Total Protein: 6.3 g/dL (ref 6.0–8.5)

## 2022-10-29 LAB — IRON,TIBC AND FERRITIN PANEL
Ferritin: 93 ng/mL (ref 15–150)
Iron Saturation: 17 % (ref 15–55)
Iron: 51 ug/dL (ref 27–139)
Total Iron Binding Capacity: 304 ug/dL (ref 250–450)
UIBC: 253 ug/dL (ref 118–369)

## 2022-10-29 LAB — B12 AND FOLATE PANEL
Folate: >20 ng/mL (ref >3.0)
Vitamin B-12: 488 pg/mL (ref 232–1245)

## 2022-10-29 LAB — HEMOGLOBIN A1C
Est. average glucose Bld gHb Est-mCnc: 120 mg/dL
Hgb A1c MFr Bld: 5.8 % — ABNORMAL HIGH (ref 4.8–5.6)

## 2022-10-29 LAB — VITAMIN B6: Vitamin B6: 46.2 ug/L (ref 3.4–65.2)

## 2022-10-29 LAB — METHYLMALONIC ACID, SERUM: Methylmalonic Acid: 220 nmol/L (ref 0–378)

## 2022-10-29 LAB — VITAMIN B1: Thiamine: 344.4 nmol/L — ABNORMAL HIGH (ref 66.5–200.0)

## 2022-10-30 ENCOUNTER — Ambulatory Visit (INDEPENDENT_AMBULATORY_CARE_PROVIDER_SITE_OTHER): Payer: Medicare Other | Admitting: Family Medicine

## 2022-10-30 ENCOUNTER — Encounter: Payer: Self-pay | Admitting: Family Medicine

## 2022-10-30 VITALS — BP 126/59 | HR 74 | Ht 61.0 in | Wt 145.0 lb

## 2022-10-30 DIAGNOSIS — F0280 Dementia in other diseases classified elsewhere without behavioral disturbance: Secondary | ICD-10-CM

## 2022-10-30 DIAGNOSIS — G309 Alzheimer's disease, unspecified: Secondary | ICD-10-CM

## 2022-10-30 DIAGNOSIS — G2581 Restless legs syndrome: Secondary | ICD-10-CM

## 2022-10-30 DIAGNOSIS — F321 Major depressive disorder, single episode, moderate: Secondary | ICD-10-CM

## 2022-10-30 DIAGNOSIS — F339 Major depressive disorder, recurrent, unspecified: Secondary | ICD-10-CM

## 2022-10-30 NOTE — Progress Notes (Signed)
Established Patient Office Visit  Subjective   Patient ID: Carol Perez, female    DOB: Nov 26, 1940  Age: 82 y.o. MRN: [id number]  Chief Complaint  Patient presents with   mood    Pt was started on Cymbalta 30 mg she reports that she hasn't noticed a difference.     HPI She is here today to follow-up on new start previously on Wellbutrin but felt like it really was not helping and so we discussed switching to an SSRI.  She has been on it for almost 4 weeks at this point.  Just not sure how much it is really helping her at this point.  She is unfortunately suspicious that her husband may be seeing another woman.  This has been an issue in their marriage before.  She feels like he sometimes belittles her and makes her feel like she does not know what is going on even though she does have early stages of dementia she feels like he tries to answer questions for her etc.  And that is really frustrating.  She did states she made an error with her medication she said she had told Dr. Daisy Blossom that she was taking the pregabalin but really had not she been holding it.  And so they upped her dose to 50 mg.  She did have her iron levels checked with Dr. Daisy Blossom as well if she was having a lot of movement in her legs at night.  She did stop her atorvastatin for a little while as well but says she just restarted it.  She would get weakness on her legs during the day.  She really did not notice a difference off of the medication.  In regards to her Alzheimer's she has her second infusion tomorrow.  She said she actually did okay with the first 1 she had some body aches and fatigue for about 3 days but was expecting worse symptoms.  Brought in home BP.      ROS    Objective:     BP (!) 126/59   Pulse 74   Ht 5\' 1"  (1.549 m)   Wt 145 lb (65.8 kg)   SpO2 98%   BMI 27.40 kg/m    Physical Exam Vitals and nursing note reviewed.  Constitutional:      Appearance: She is well-developed.  HENT:      Head: Normocephalic and atraumatic.  Cardiovascular:     Rate and Rhythm: Normal rate and regular rhythm.     Heart sounds: Normal heart sounds.  Pulmonary:     Effort: Pulmonary effort is normal.     Breath sounds: Normal breath sounds.  Skin:    General: Skin is warm and dry.  Neurological:     Mental Status: She is alert and oriented to person, place, and time.  Psychiatric:        Behavior: Behavior normal.      No results found for any visits on 10/30/22.    The ASCVD Risk score (Arnett DK, et al., 2019) failed to calculate for the following reasons:   The 2019 ASCVD risk score is only valid for ages 44 to 48    Assessment & Plan:   Problem List Items Addressed This Visit       Nervous and Auditory   Alzheimer disease (HCC)    Flu she will continue to do well with her infusions next 1 is tomorrow.      Relevant Medications   pregabalin (LYRICA)  25 MG capsule     Other   Restless legs    Okay to restart the pregabalin/Lyrica 25 mg daily.  If tolerating well and you want to try the higher dose you can go up to the 50 mg after about 10 days. If it is not helping your leg symptoms then you can discontinue it.      Depression, recurrent (HCC)    She is not sure at this point that the Cymbalta has been helpful for her mood will continue for another month to see but if not we can discontinue and try something else if she is open to it.  We also discussed the possibility of talking with a therapist.  Is that her husband would complain about the cost.      Other Visit Diagnoses     Current moderate episode of major depressive disorder without prior episode (HCC)    -  Primary       Return in about 2 months (around 12/30/2022) for Mood.   I spent 30 minutes on the day of the encounter to include pre-visit record review, face-to-face time with the patient and post visit ordering of test.  Nani Gasser, MD

## 2022-10-30 NOTE — Assessment & Plan Note (Signed)
She is not sure at this point that the Cymbalta has been helpful for her mood will continue for another month to see but if not we can discontinue and try something else if she is open to it.  We also discussed the possibility of talking with a therapist.  Is that her husband would complain about the cost.

## 2022-10-30 NOTE — Progress Notes (Signed)
She brought in her home bp cuff: 130/76 p:78

## 2022-10-30 NOTE — Assessment & Plan Note (Signed)
Flu she will continue to do well with her infusions next 1 is tomorrow.

## 2022-10-30 NOTE — Assessment & Plan Note (Signed)
Okay to restart the pregabalin/Lyrica 25 mg daily.  If tolerating well and you want to try the higher dose you can go up to the 50 mg after about 10 days. If it is not helping your leg symptoms then you can discontinue it.

## 2022-10-30 NOTE — Patient Instructions (Addendum)
Okay to restart the pregabalin/Lyrica 25 mg daily.  If tolerating well and you want to try the higher dose you can go up to the 50 mg after about 10 days. If it is not helping your leg symptoms then you can discontinue it. Okay to restart your atorvastatin to reduce your risk for stroke and heart attack. You Can STOP the extra iron. You do not need the extra iron.  Your iron levels look good.

## 2022-10-31 DIAGNOSIS — G3184 Mild cognitive impairment, so stated: Secondary | ICD-10-CM | POA: Diagnosis not present

## 2022-10-31 DIAGNOSIS — Z006 Encounter for examination for normal comparison and control in clinical research program: Secondary | ICD-10-CM | POA: Diagnosis not present

## 2022-11-13 ENCOUNTER — Other Ambulatory Visit: Payer: Self-pay | Admitting: Neurology

## 2022-11-13 ENCOUNTER — Telehealth: Payer: Self-pay | Admitting: Neurology

## 2022-11-13 DIAGNOSIS — Z79899 Other long term (current) drug therapy: Secondary | ICD-10-CM | POA: Insufficient documentation

## 2022-11-13 NOTE — Telephone Encounter (Signed)
[employer] medicare NPR sent to GI [phone] 

## 2022-11-14 ENCOUNTER — Encounter: Payer: Self-pay | Admitting: Neurology

## 2022-11-14 DIAGNOSIS — Z006 Encounter for examination for normal comparison and control in clinical research program: Secondary | ICD-10-CM | POA: Diagnosis not present

## 2022-11-14 DIAGNOSIS — G3184 Mild cognitive impairment, so stated: Secondary | ICD-10-CM | POA: Diagnosis not present

## 2022-11-21 ENCOUNTER — Ambulatory Visit: Payer: Medicare Other | Admitting: Neurology

## 2022-11-28 ENCOUNTER — Other Ambulatory Visit: Payer: Self-pay | Admitting: Family Medicine

## 2022-11-28 ENCOUNTER — Telehealth: Payer: Self-pay | Admitting: *Deleted

## 2022-11-28 DIAGNOSIS — G3184 Mild cognitive impairment, so stated: Secondary | ICD-10-CM | POA: Diagnosis not present

## 2022-11-28 DIAGNOSIS — F321 Major depressive disorder, single episode, moderate: Secondary | ICD-10-CM

## 2022-11-28 DIAGNOSIS — Z006 Encounter for examination for normal comparison and control in clinical research program: Secondary | ICD-10-CM | POA: Diagnosis not present

## 2022-11-28 NOTE — Telephone Encounter (Signed)
Need order for MRI for 12-27-2022 thru 01-05-2023.  Has MRI scheduled for 12-06-2022 mobile mri.   Lequembi protocol.

## 2022-11-29 ENCOUNTER — Encounter: Payer: Self-pay | Admitting: Family Medicine

## 2022-11-29 ENCOUNTER — Ambulatory Visit (INDEPENDENT_AMBULATORY_CARE_PROVIDER_SITE_OTHER): Payer: Medicare Other | Admitting: Family Medicine

## 2022-11-29 ENCOUNTER — Other Ambulatory Visit: Payer: Self-pay | Admitting: Neurology

## 2022-11-29 VITALS — BP 150/81 | HR 85 | Resp 18 | Ht 61.0 in | Wt 145.2 lb

## 2022-11-29 DIAGNOSIS — R300 Dysuria: Secondary | ICD-10-CM | POA: Insufficient documentation

## 2022-11-29 DIAGNOSIS — Z79899 Other long term (current) drug therapy: Secondary | ICD-10-CM

## 2022-11-29 DIAGNOSIS — S00211A Abrasion of right eyelid and periocular area, initial encounter: Secondary | ICD-10-CM

## 2022-11-29 LAB — POCT URINALYSIS DIP (CLINITEK)
Bilirubin, UA: NEGATIVE
Blood, UA: NEGATIVE
Glucose, UA: NEGATIVE mg/dL
Ketones, POC UA: NEGATIVE mg/dL
Leukocytes, UA: NEGATIVE
Nitrite, UA: NEGATIVE
POC PROTEIN,UA: NEGATIVE
Spec Grav, UA: 1.015 (ref 1.010–1.025)
Urobilinogen, UA: 0.2 E.U./dL
pH, UA: 7.5 (ref 5.0–8.0)

## 2022-11-29 MED ORDER — CEPHALEXIN 500 MG PO CAPS: 500.0000 mg | ORAL_CAPSULE | Freq: Two times a day (BID) | ORAL | 0 refills | Status: AC

## 2022-11-29 NOTE — Patient Instructions (Signed)
-   increase water intake to four 12oz water glasses per day  - try not to take benadryl to go to sleep. Take 3mg  of melatonin instead  - take kelfex

## 2022-11-29 NOTE — Progress Notes (Signed)
Acute Office Visit  Subjective:     Patient ID: Carol Perez, female    DOB: 04/06/1941, 82 y.o.   MRN: [id number]  Chief Complaint  Patient presents with   Urinary Tract Infection    HPI Patient is in today for dysuria. Has had four days of urinary pain and pressure along with urgency. She took OTC azo for three days without much improvement.  Review of Systems  Constitutional:  Negative for chills and fever.  Respiratory:  Negative for cough and shortness of breath.   Cardiovascular:  Negative for chest pain.  Genitourinary:  Positive for dysuria and urgency.  Neurological:  Negative for headaches.        Objective:    BP (!) 150/81 (BP Location: Left Arm, Patient Position: Sitting, Cuff Size: Normal)   Pulse 85   Resp 18   Ht 5\' 1"  (1.549 m)   Wt 145 lb 4 oz (65.9 kg)   SpO2 96%   BMI 27.44 kg/m    Physical Exam Vitals and nursing note reviewed.  Constitutional:      General: She is not in acute distress.    Appearance: Normal appearance.  HENT:     Head: Normocephalic and atraumatic.     Right Ear: External ear normal.     Left Ear: External ear normal.     Nose: Nose normal.  Eyes:     Conjunctiva/sclera: Conjunctivae normal.  Cardiovascular:     Rate and Rhythm: Normal rate and regular rhythm.  Pulmonary:     Effort: Pulmonary effort is normal.     Breath sounds: Normal breath sounds.  Abdominal:     Tenderness: There is no right CVA tenderness or left CVA tenderness.  Neurological:     General: No focal deficit present.     Mental Status: She is alert and oriented to person, place, and time.  Psychiatric:        Mood and Affect: Mood normal.        Behavior: Behavior normal.        Thought Content: Thought content normal.        Judgment: Judgment normal.     Results for orders placed or performed in visit on 11/29/22  POCT URINALYSIS DIP (CLINITEK)  Result Value Ref Range   Color, UA yellow yellow   Clarity, UA clear clear   Glucose, UA  negative negative mg/dL   Bilirubin, UA negative negative   Ketones, POC UA negative negative mg/dL   Spec Grav, UA 8.119 1.478 - 1.025   Blood, UA negative negative   pH, UA 7.5 5.0 - 8.0   POC PROTEIN,UA negative negative, trace   Urobilinogen, UA 0.2 0.2 or 1.0 E.U./dL   Nitrite, UA Negative Negative   Leukocytes, UA Negative Negative        Assessment & Plan:   Problem List Items Addressed This Visit       Other   Dysuria - Primary    - UA negative for leuks and nitrites but pt is still symptomatic I will treat with keflex and obtain urine culture to see if there is any abnormal growth. From chart review this is the second time in a few months this has happened. It looks like she may need to go to urology for this urgency or try a medication for urinary urgency.      Relevant Orders   POCT URINALYSIS DIP (CLINITEK) (Completed)   Urine Culture    Meds ordered this  encounter  Medications   cephALEXin (KEFLEX) 500 MG capsule    Sig: Take 1 capsule (500 mg total) by mouth 2 (two) times daily for 5 days.    Dispense:  10 capsule    Refill:  0    No follow-ups on file.  Charlton Amor, DO

## 2022-11-29 NOTE — Telephone Encounter (Signed)
Ordered thank you

## 2022-11-29 NOTE — Assessment & Plan Note (Signed)
-   UA negative for leuks and nitrites but pt is still symptomatic I will treat with keflex and obtain urine culture to see if there is any abnormal growth. From chart review this is the second time in a few months this has happened. It looks like she may need to go to urology for this urgency or try a medication for urinary urgency.

## 2022-11-30 NOTE — Telephone Encounter (Signed)
[employer] medicare NPR sent to schedule at [facility] [phone]

## 2022-12-02 LAB — URINE CULTURE
MICRO NUMBER:: 15102215
SPECIMEN QUALITY:: ADEQUATE

## 2022-12-06 ENCOUNTER — Ambulatory Visit (HOSPITAL_BASED_OUTPATIENT_CLINIC_OR_DEPARTMENT_OTHER)
Admission: RE | Admit: 2022-12-06 | Discharge: 2022-12-06 | Disposition: A | Discharge status: Home / Self Care | Payer: Medicare Other | Source: Ambulatory Visit | Attending: Neurology | Admitting: Neurology

## 2022-12-06 DIAGNOSIS — I6782 Cerebral ischemia: Secondary | ICD-10-CM | POA: Diagnosis not present

## 2022-12-06 DIAGNOSIS — I639 Cerebral infarction, unspecified: Secondary | ICD-10-CM | POA: Diagnosis not present

## 2022-12-06 DIAGNOSIS — G319 Degenerative disease of nervous system, unspecified: Secondary | ICD-10-CM | POA: Diagnosis not present

## 2022-12-06 MED ORDER — GADOBUTROL 1 MMOL/ML IV SOLN
6.5000 mL | Freq: Once | INTRAVENOUS | Status: AC | PRN
  Administered 2022-12-06: 6.5 mL via INTRAVENOUS

## 2022-12-11 ENCOUNTER — Telehealth: Payer: Self-pay | Admitting: Neurology

## 2022-12-11 ENCOUNTER — Ambulatory Visit: Payer: Medicare Other | Admitting: Family Medicine

## 2022-12-11 NOTE — Telephone Encounter (Signed)
I called [facility] Radiology reading room and asked for MRI to be read today as patient has Leqembi infusion tomorrow and it has to be read before then. The staff will ask a radiologist to read it today.

## 2022-12-11 NOTE — Telephone Encounter (Signed)
Pt stated she is following up on MRI results. Stated she she has a infusion treatment for tomorrow and she needs to know what to do.

## 2022-12-11 NOTE — Telephone Encounter (Signed)
Called [employer]. Rep advised me that results were not complete and that physician would need to contact reading room at [phone] to have results read to them. She advised me that physician would have to call, I am not able to contact reading room on their behalf.

## 2022-12-11 NOTE — Telephone Encounter (Signed)
MRI was read. Dr Lucia Gaskins sent results to patient. I notified Holly w/ [facility].

## 2022-12-12 DIAGNOSIS — G3184 Mild cognitive impairment, so stated: Secondary | ICD-10-CM | POA: Diagnosis not present

## 2022-12-12 DIAGNOSIS — Z006 Encounter for examination for normal comparison and control in clinical research program: Secondary | ICD-10-CM | POA: Diagnosis not present

## 2022-12-15 ENCOUNTER — Ambulatory Visit (INDEPENDENT_AMBULATORY_CARE_PROVIDER_SITE_OTHER): Payer: Medicare Other | Admitting: Family Medicine

## 2022-12-15 VITALS — BP 108/68 | HR 89 | Ht 61.0 in | Wt 144.0 lb

## 2022-12-15 DIAGNOSIS — N390 Urinary tract infection, site not specified: Secondary | ICD-10-CM | POA: Diagnosis not present

## 2022-12-15 DIAGNOSIS — R8290 Unspecified abnormal findings in urine: Secondary | ICD-10-CM | POA: Diagnosis not present

## 2022-12-15 LAB — POCT URINALYSIS DIP (CLINITEK)
Bilirubin, UA: NEGATIVE
Blood, UA: NEGATIVE
Glucose, UA: NEGATIVE mg/dL
Nitrite, UA: NEGATIVE
POC PROTEIN,UA: 30 — AB
Spec Grav, UA: 1.02 (ref 1.010–1.025)
Urobilinogen, UA: 0.2 E.U./dL
pH, UA: 6.5 (ref 5.0–8.0)

## 2022-12-15 MED ORDER — NITROFURANTOIN MONOHYD MACRO 100 MG PO CAPS: 100.0000 mg | ORAL_CAPSULE | Freq: Two times a day (BID) | ORAL | 0 refills | Status: DC

## 2022-12-15 NOTE — Progress Notes (Signed)
Possible UTI-we will go ahead and treat with Macrobid.  Culture sent to confirm.  Last UTI was about 3 weeks ago and urine culture grew out E. coli Enterococcus faecalis.  But both isolate's were under 50,000.  So question whether or not a true UTI.

## 2022-12-15 NOTE — Progress Notes (Signed)
Patient is here for frequency and burning for 2 days   Patient reports recent/no recent antibiotic use or recent catherization.   Patient has taken Azo x 2 days  Denies flank pain, pelvic pain, fever, chills, or sweats.  POCT UA completed. Urine sent for culture.   Dr. Linford Arnold will send medication for patient.

## 2022-12-16 LAB — URINE CULTURE
MICRO NUMBER:: 15164837
SPECIMEN QUALITY:: ADEQUATE

## 2022-12-18 NOTE — Progress Notes (Signed)
Hi Coralee, the urine culture is negative.  If you still have some antibiotics you can stop them.  No sign of active infection.

## 2022-12-26 ENCOUNTER — Other Ambulatory Visit: Payer: Medicare Other

## 2022-12-26 DIAGNOSIS — G3184 Mild cognitive impairment, so stated: Secondary | ICD-10-CM | POA: Diagnosis not present

## 2022-12-26 DIAGNOSIS — Z006 Encounter for examination for normal comparison and control in clinical research program: Secondary | ICD-10-CM | POA: Diagnosis not present

## 2023-01-01 ENCOUNTER — Encounter: Payer: Self-pay | Admitting: Family Medicine

## 2023-01-01 ENCOUNTER — Ambulatory Visit: Payer: Medicare Other | Admitting: Family Medicine

## 2023-01-01 VITALS — BP 153/79 | HR 76 | Ht 61.0 in | Wt 143.0 lb

## 2023-01-01 DIAGNOSIS — F339 Major depressive disorder, recurrent, unspecified: Secondary | ICD-10-CM | POA: Diagnosis not present

## 2023-01-01 DIAGNOSIS — R7301 Impaired fasting glucose: Secondary | ICD-10-CM

## 2023-01-01 DIAGNOSIS — M4186 Other forms of scoliosis, lumbar region: Secondary | ICD-10-CM

## 2023-01-01 DIAGNOSIS — M810 Age-related osteoporosis without current pathological fracture: Secondary | ICD-10-CM | POA: Diagnosis not present

## 2023-01-01 DIAGNOSIS — E785 Hyperlipidemia, unspecified: Secondary | ICD-10-CM

## 2023-01-01 DIAGNOSIS — G2581 Restless legs syndrome: Secondary | ICD-10-CM | POA: Diagnosis not present

## 2023-01-01 DIAGNOSIS — R252 Cramp and spasm: Secondary | ICD-10-CM

## 2023-01-01 DIAGNOSIS — M25562 Pain in left knee: Secondary | ICD-10-CM

## 2023-01-01 DIAGNOSIS — F321 Major depressive disorder, single episode, moderate: Secondary | ICD-10-CM

## 2023-01-01 DIAGNOSIS — G4762 Sleep related leg cramps: Secondary | ICD-10-CM

## 2023-01-01 MED ORDER — ATORVASTATIN CALCIUM 10 MG PO TABS: ORAL_TABLET | ORAL | 1 refills | Status: DC

## 2023-01-01 MED ORDER — DENOSUMAB 60 MG/ML ~~LOC~~ SOSY
60.0000 mg | PREFILLED_SYRINGE | Freq: Once | SUBCUTANEOUS | Status: AC
  Administered 2023-01-01: 60 mg via SUBCUTANEOUS

## 2023-01-01 MED ORDER — DULOXETINE HCL 30 MG PO CPEP: 30.0000 mg | ORAL_CAPSULE | Freq: Every day | ORAL | 1 refills | Status: DC

## 2023-01-01 NOTE — Progress Notes (Signed)
Established Patient Office Visit  Subjective   Patient ID: Carol Perez, female    DOB: 06/01/1941  Age: 82 y.o. MRN: [id number]  Chief Complaint  Patient presents with   Follow-up   Osteoporosis    HPI  F/U Anxiety - we d/c'd wellbutrin and changed to Cymbalta.  She feels like it is helping some.  She denies any recent depressive type symptoms.  F/U RLS  - restarted Lyrica.    IFG - too soon for A1C.    Osteoporosis follow-up-due for Prolia today.   Is not been sleeping well lately she says she goes to bed around 9 and then she gets wide-awake around 10 and then it usually 1 or 2 AM before she actually falls asleep she has been taking some 5 mg melatonin and she does feel like it helps when she takes it.  She avoids caffeine.  She does try to go to bed around the same time.  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  She just had her follow-up MRI about 3 weeks ago and everything was stable she did bring in a copy of that report today and wanted me to look at it and go over it with her.    ROS    Objective:     BP (!) 153/79   Pulse 76   Ht 5\' 1"  (1.549 m)   Wt 143 lb (64.9 kg)   SpO2 99%   BMI 27.02 kg/m    Physical Exam Vitals and nursing note reviewed.  Constitutional:      Appearance: She is well-developed.  HENT:     Head: Normocephalic and atraumatic.  Cardiovascular:     Rate and Rhythm: Normal rate and regular rhythm.     Heart sounds: Normal heart sounds.  Pulmonary:     Effort: Pulmonary effort is normal.     Breath sounds: Normal breath sounds.  Skin:    General: Skin is warm and dry.  Neurological:     Mental Status: She is alert and oriented to person, place, and time.  Psychiatric:        Behavior: Behavior normal.      No results found for any visits on  01/01/23.    The ASCVD Risk score (Arnett DK, et al., 2019) failed to calculate for the following reasons:   The 2019 ASCVD risk score is only valid for ages 16 to 86    Assessment & Plan:   Problem List Items Addressed This Visit       Endocrine   IFG (impaired fasting glucose) - Primary    Due to recheck A1c at next office visit        Musculoskeletal and Integument   Osteoporosis    Given Prolia injection today.  Labs were normal 4 months ago.  Repeat in 6 months.  Her authorization was obtained please see notes and records.      Lumbar scoliosis     Other   Restless legs    She does note that her restless leg seems better when she is able to attend her exercise classes but lately she has not been going because of her left knee pain when she goes it tends to exacerbate her knee.      Nocturnal leg cramps    Does use  an over-the-counter cream that she gets off [employer] which does help some.  She also notes that when she is more active her cramping is better.      Hyperlipidemia   Relevant Medications   atorvastatin (LIPITOR) 10 MG tablet   Depression, recurrent (HCC)    Currently well controlled, but struggling a little bit more with anxiety right now.  Did discuss that we can always adjust the duloxetine if needed but she would prefer to stay on her current regimen.  Change to 90-day supply.      Relevant Medications   DULoxetine (CYMBALTA) 30 MG capsule   Other Visit Diagnoses     Current moderate episode of major depressive disorder without prior episode (HCC)       Relevant Medications   DULoxetine (CYMBALTA) 30 MG capsule   Leg cramping       Acute pain of left knee           Knee pain, left-recommend schedule with Dr. Benjamin Stain for further treatment options.  I am concerned that it is affecting her ability to go to her regular exercise classes which in turn help with her sleep and her restless leg and her leg cramps.  Leg cramping and restless  leg-seems to be better when she is able to go to her regular Monday, Wednesday, Friday exercise class.  But it has been limited because of her knee pain.  BP is high today but was normal 2 weeks ago.    Return in about 3 months (around 04/03/2023) for Prediabetes and Restless Legs.  Nani Gasser, MD

## 2023-01-01 NOTE — Assessment & Plan Note (Signed)
Due to recheck A1c at next office visit

## 2023-01-01 NOTE — Patient Instructions (Addendum)
Please schedule an appointment with Dr. Benjamin Stain here in our office for your left knee.  I want you to be able to get to your exercise classes if your knee is feeling better.  Continue the duloxetine for the anxiety.  We did remove the pregabalin and added it to your intolerance list.  Okay use Tylenol as needed for pain in that left knee or low back.  Try moving your melatonin to about 7:30 in the evening to see if that is improving your sleep quality at night.

## 2023-01-01 NOTE — Assessment & Plan Note (Addendum)
Currently well controlled, but struggling a little bit more with anxiety right now.  Did discuss that we can always adjust the duloxetine if needed but she would prefer to stay on her current regimen.  Change to 90-day supply.

## 2023-01-01 NOTE — Assessment & Plan Note (Signed)
She does note that her restless leg seems better when she is able to attend her exercise classes but lately she has not been going because of her left knee pain when she goes it tends to exacerbate her knee.

## 2023-01-01 NOTE — Assessment & Plan Note (Signed)
Given Prolia injection today.  Labs were normal 4 months ago.  Repeat in 6 months.  Her authorization was obtained please see notes and records.

## 2023-01-01 NOTE — Assessment & Plan Note (Signed)
Does use an over-the-counter cream that she gets off [employer] which does help some.  She also notes that when she is more active her cramping is better.

## 2023-01-02 ENCOUNTER — Ambulatory Visit: Payer: Medicare Other | Admitting: Family Medicine

## 2023-01-03 ENCOUNTER — Ambulatory Visit (HOSPITAL_BASED_OUTPATIENT_CLINIC_OR_DEPARTMENT_OTHER)
Admission: RE | Admit: 2023-01-03 | Discharge: 2023-01-03 | Disposition: A | Discharge status: Home / Self Care | Payer: Medicare Other | Source: Ambulatory Visit | Attending: Neurology | Admitting: Neurology

## 2023-01-03 DIAGNOSIS — G319 Degenerative disease of nervous system, unspecified: Secondary | ICD-10-CM | POA: Diagnosis not present

## 2023-01-03 DIAGNOSIS — I6782 Cerebral ischemia: Secondary | ICD-10-CM | POA: Diagnosis not present

## 2023-01-03 DIAGNOSIS — Z79899 Other long term (current) drug therapy: Secondary | ICD-10-CM | POA: Insufficient documentation

## 2023-01-03 DIAGNOSIS — I639 Cerebral infarction, unspecified: Secondary | ICD-10-CM | POA: Diagnosis not present

## 2023-01-03 DIAGNOSIS — S00211A Abrasion of right eyelid and periocular area, initial encounter: Secondary | ICD-10-CM | POA: Insufficient documentation

## 2023-01-05 ENCOUNTER — Ambulatory Visit: Payer: Medicare Other

## 2023-01-09 DIAGNOSIS — Z006 Encounter for examination for normal comparison and control in clinical research program: Secondary | ICD-10-CM | POA: Diagnosis not present

## 2023-01-09 DIAGNOSIS — G3184 Mild cognitive impairment, so stated: Secondary | ICD-10-CM | POA: Diagnosis not present

## 2023-01-10 ENCOUNTER — Telehealth: Payer: Self-pay | Admitting: *Deleted

## 2023-01-10 NOTE — Telephone Encounter (Signed)
Holly from [facility]  is aware of MRI results

## 2023-01-10 NOTE — Telephone Encounter (Signed)
-----   Message from Anson Fret sent at 01/10/2023  2:48 PM EDT ----- Can you let the infusion center know so they can continue infusing her? MRI of the brain without ARIA changes.

## 2023-01-12 ENCOUNTER — Telehealth: Payer: Self-pay

## 2023-01-12 NOTE — Telephone Encounter (Signed)
Patient came into office to drop [employer], [employer] for her and her spouse, they will be going to [facility], forms placed in Dr. Norman Herrlich box, thanks.

## 2023-01-15 ENCOUNTER — Encounter: Payer: Self-pay | Admitting: *Deleted

## 2023-01-15 ENCOUNTER — Ambulatory Visit (INDEPENDENT_AMBULATORY_CARE_PROVIDER_SITE_OTHER): Payer: Medicare Other

## 2023-01-15 ENCOUNTER — Telehealth: Payer: Self-pay | Admitting: Family Medicine

## 2023-01-15 ENCOUNTER — Ambulatory Visit (INDEPENDENT_AMBULATORY_CARE_PROVIDER_SITE_OTHER): Payer: Medicare Other | Admitting: Sports Medicine

## 2023-01-15 DIAGNOSIS — Z0189 Encounter for other specified special examinations: Secondary | ICD-10-CM | POA: Diagnosis not present

## 2023-01-15 DIAGNOSIS — M1712 Unilateral primary osteoarthritis, left knee: Secondary | ICD-10-CM

## 2023-01-15 DIAGNOSIS — G4762 Sleep related leg cramps: Secondary | ICD-10-CM

## 2023-01-15 DIAGNOSIS — M25561 Pain in right knee: Secondary | ICD-10-CM | POA: Diagnosis not present

## 2023-01-15 DIAGNOSIS — M25462 Effusion, left knee: Secondary | ICD-10-CM | POA: Diagnosis not present

## 2023-01-15 DIAGNOSIS — M25562 Pain in left knee: Secondary | ICD-10-CM | POA: Diagnosis not present

## 2023-01-15 MED ORDER — ACETAMINOPHEN ER 650 MG PO TBCR
650.0000 mg | EXTENDED_RELEASE_TABLET | Freq: Three times a day (TID) | ORAL | Status: AC | PRN
Start: 2023-01-15 — End: ?

## 2023-01-15 NOTE — Progress Notes (Signed)
    Procedures performed today:    None.  Independent interpretation of notes and tests performed by another provider:   None.  Brief History, Exam, Impression, and Recommendations:    Primary osteoarthritis of left knee This is an exquisitely pleasant 82 year old female with history of dementia, she has had a long history of on and off knee pain, more recently left-sided, medial joint line and anterior. On exam she has a mild effusion, tenderness at the medial joint line. She has tried some Tylenol which seems to help, it was regular strength, I explained to her the treatment protocol, starting with over-the-counter medications followed by prescription medications, if this fails we try injections, and if that fails would consider things like surgery. We are just at the beginning so we will add arthritis strength Tylenol, formal physical therapy, updated x-rays today, return to see me in 4 to 6 weeks.  Nocturnal leg cramps Carol Perez does have some nocturnal leg cramping, she tried magnesium that gave her diarrhea, she did try some pain relief homeopathic cream that seems to help a bit. Her cramping is likely related to her lumbar degenerative processes, she is post L2-S1 fusion. For now we will hold off on treatment and focus on her knees which may also be contributing.    ____________________________________________ Ihor Austin. Benjamin Stain, M.D., ABFM., CAQSM., AME. Primary Care and Sports Medicine [facility]  Adjunct Professor of Family Medicine  [employer]  Restaurant manager, fast food

## 2023-01-15 NOTE — Assessment & Plan Note (Signed)
This is an exquisitely pleasant 82 year old female with history of dementia, she has had a long history of on and off knee pain, more recently left-sided, medial joint line and anterior. On exam she has a mild effusion, tenderness at the medial joint line. She has tried some Tylenol which seems to help, it was regular strength, I explained to her the treatment protocol, starting with over-the-counter medications followed by prescription medications, if this fails we try injections, and if that fails would consider things like surgery. We are just at the beginning so we will add arthritis strength Tylenol, formal physical therapy, updated x-rays today, return to see me in 4 to 6 weeks.

## 2023-01-15 NOTE — Assessment & Plan Note (Signed)
Carol Perez does have some nocturnal leg cramping, she tried magnesium that gave her diarrhea, she did try some pain relief homeopathic cream that seems to help a bit. Her cramping is likely related to her lumbar degenerative processes, she is post L2-S1 fusion. For now we will hold off on treatment and focus on her knees which may also be contributing.

## 2023-01-15 NOTE — Telephone Encounter (Signed)
Patient called in needed a refill for Valtrex 500mg . Please Advise  [facility] [phone]

## 2023-01-17 MED ORDER — VALACYCLOVIR HCL 500 MG PO TABS: 500.0000 mg | ORAL_TABLET | Freq: Every day | ORAL | 0 refills | Status: DC | PRN

## 2023-01-17 NOTE — Telephone Encounter (Signed)
Medication sent to requested pharmacy.

## 2023-01-17 NOTE — Telephone Encounter (Signed)
Form completed and placed in tony B basket.  Need to attach problem list and needs to be mailed.

## 2023-01-18 ENCOUNTER — Ambulatory Visit: Payer: Medicare Other | Attending: Sports Medicine

## 2023-01-18 ENCOUNTER — Other Ambulatory Visit: Payer: Self-pay

## 2023-01-18 DIAGNOSIS — M25562 Pain in left knee: Secondary | ICD-10-CM | POA: Diagnosis present

## 2023-01-18 DIAGNOSIS — G8929 Other chronic pain: Secondary | ICD-10-CM | POA: Diagnosis present

## 2023-01-18 DIAGNOSIS — R2681 Unsteadiness on feet: Secondary | ICD-10-CM | POA: Insufficient documentation

## 2023-01-18 DIAGNOSIS — M25662 Stiffness of left knee, not elsewhere classified: Secondary | ICD-10-CM | POA: Diagnosis present

## 2023-01-18 DIAGNOSIS — M1712 Unilateral primary osteoarthritis, left knee: Secondary | ICD-10-CM | POA: Insufficient documentation

## 2023-01-18 NOTE — Telephone Encounter (Signed)
Spoke with patient, informed patient of forms completed, forms put out to be mailed out to [employer], thanks.

## 2023-01-18 NOTE — Therapy (Signed)
OUTPATIENT PHYSICAL THERAPY LOWER EXTREMITY EVALUATION   Patient Name: Carol Perez MRN: [id number] DOB:27-Feb-1941, 82 y.o., female Today's Date: 01/18/2023  END OF SESSION:  PT End of Session - 01/18/23 0918     Visit Number 1    Number of Visits 8    Date for PT Re-Evaluation 02/15/23    Authorization Type [employer] Medicare    Progress Note Due on Visit 10    PT Start Time [phone]    PT Stop Time 1030    PT Time Calculation (min) 54 min             Past Medical History:  Diagnosis Date   Barrett's esophagus    Colitis    DDD (degenerative disc disease)    Factor 5 Leiden mutation, heterozygous (HCC)    Per [facility] Patient Packet.   Frequent UTI    H/O echocardiogram 04/01/2019   Per [facility] Packet.   Hiatal hernia    Per [facility] Packet.   High grade dysplasia of Barrett's epithelium    Per [facility] New Patient Packet.   History of bladder infections    Per [facility] New Patient Packet.   History of Papanicolaou smear of cervix 10/16/2011   Per [facility] New [facility] Packet   Hypoglycemia    Hypotension    Per [facility] New Patient Packet.   IBS (irritable bowel syndrome)    Per [facility].   Lactose intolerance    Osteoarthritis    Per [facility] Packet.   Osteopenia    Per [facility].   Osteoporosis    Premature ventricular complex    Raynaud's disease    Per [facility] New Patient Packet.   Scoliosis    Per [facility] New Patient Packet.   Past Surgical History:  Procedure Laterality Date   ABDOMINAL EXPOSURE N/A 08/12/2020   Procedure: ABDOMINAL EXPOSURE;  Surgeon: Nada Libman, MD;  Location: [facility] OR;  Service: Vascular;  Laterality: N/A;   ABDOMINAL HYSTERECTOMY  06/13/2011   By Dr.Scherer at [facility]. Per [facility] New Patient Packet.   ANTERIOR LAT LUMBAR FUSION Right 08/12/2020   Procedure: Right Lumbar two-three, Lumbar three-four, Lumbar four-five Anterolateral lumbar interbody fusion;  Surgeon: Maeola Harman, MD;  Location: [facility] OR;  Service:  Neurosurgery;  Laterality: Right;   ANTERIOR LUMBAR FUSION N/A 08/12/2020   Procedure: Lumbar five Sacral one Anterior lumbar interbody fusion;  Surgeon: Maeola Harman, MD;  Location: [facility] OR;  Service: Neurosurgery;  Laterality: N/A;   BREAST EXCISIONAL BIOPSY Left 30+ yrs ago   COLONOSCOPY  11/23/2011   By Dr.McCune at [facility]. Per [facility] New Patient Packet.   COLONOSCOPY  08/26/2019   LUMBAR PERCUTANEOUS PEDICLE SCREW 4 LEVEL N/A 08/12/2020   Procedure: Percutaneous pedicle screw fixation from Lumbar two to Sacral one ;  Surgeon: Maeola Harman, MD;  Location: [facility] OR;  Service: Neurosurgery;  Laterality: N/A;   MAMMOGRAM  04/14/2019   pap smear  10/16/2011   SIGMOIDOSCOPY  11/02/2014   Per [facility] New Patient Packet   TONSILLECTOMY  06/12/1948   Per [facility] New Patient Packet.   Patient Active Problem List   Diagnosis Date Noted   screen for Dalene Carrow syndrome 11/13/2022   Long term use of drug 11/13/2022   Restless legs 10/18/2022   Sleep related leg cramps 10/18/2022   IFG (impaired fasting glucose) 07/10/2022   Neuropathy of both feet 07/10/2022   Dry eye 07/10/2022   Alzheimer  disease (HCC) 05/16/2022   FHx: Alzheimer's disease 05/16/2022   Nocturnal leg cramps 11/23/2021   Overactive bladder 11/23/2021   Depression, recurrent (HCC) 08/24/2020   Atrophic vaginitis 08/24/2020   Blood loss anemia 08/24/2020   Hyponatremia    Lumbar scoliosis 08/12/2020   Occipital neuralgia of right side 09/08/2019   Recurrent UTI 06/26/2019   Osteoporosis 06/26/2019   IBS (irritable bowel syndrome) 06/26/2019   Hyperlipidemia 06/26/2019   Back pain 06/26/2019   Barrett's esophagus 06/26/2019   GERD (gastroesophageal reflux disease) 05/01/2019   Post-menopause 05/01/2019   Chondromalacia patellae 01/16/2013   Primary osteoarthritis of left knee 01/16/2013   Neoplasm 06/24/2012   Midline cystocele 05/14/2012    PCP: Agapito Games, MD  REFERRING PROVIDER: Monica Becton,*  REFERRING DIAG:  A54.09 (ICD-10-CM) - Primary osteoarthritis of left knee    THERAPY DIAG:  Unsteadiness on feet  Chronic pain of left knee  Stiffness of left knee, not elsewhere classified  Rationale for Evaluation and Treatment: Rehabilitation  ONSET DATE: Subacute  SUBJECTIVE:   SUBJECTIVE STATEMENT: The patient stated her L knee has been "acting up, being ugly". She initially injured it getting down on her knees to pull weeds in her flower garden - when she went to stand she found couldn't get back up due to pain. She was able to pull herself up on a window sill to get back to standing. After this incident, her L knee was sore, but she was able to manage her pain with moist heat and salve. Normally, she attends an exercise class at the senior center on Monday/Wednesday/Friday. She found her L knee pain was always worse after class so discontinued attending. In general, she feels her L leg is weaker than her R after her back surgery - she did not note any L leg weakness prior to the surgery. When the pain began, it started at knee cap, then moved to the medial knee, and now is experienced in lateral knee. Since onset, the pain has been behaving about the same, not worsening or improving. Pivoting on the L leg increases the pain, as does being on her feet for extended periods. Stairs are also difficult because of the L knee pain with descending being worse than ascending. The patient also noted that going to stand/move after periods of rest is difficult as the L knee gets stiff and sore. When possible, she does some ankle pumps in multiple directions before moving to help get things loosened up. Once she is up and moving, she feels better. The patient stated this pain is not so bad that she could not tolerate it, but would still like it to get better. She did not note any major prior pain events at the L knee and denied any other pain in her lower extremities. Along with pain, the  patient also described balance issues. She is currently receiving Leqembi infusions for Alzheimer's and thinks the medication may be affecting her balance - it also causes hand tremors for her. The patient lives in a condo with her husband. They have someone come in to vacuum once per month as the patient is unable due to her low back.   PERTINENT HISTORY: 2022 - Lumbar fusion L2-S1 PAIN:  Current pain: 1/10 NPRS Max pain: 7-8/10 NPRS  PRECAUTIONS: None  RED FLAGS: None   WEIGHT BEARING RESTRICTIONS: No  FALLS:  Has patient fallen in last 6 months? No  LIVING ENVIRONMENT: Lives with: lives with their family and lives  with their spouse Lives in: House/apartment Stairs: No Has following equipment at home: None Moving into a retirement center in [place] in near future  OCCUPATION: Retired  PLOF: Independent  PATIENT GOALS: decrease pain, improve balance, return to exercise classes  NEXT MD VISIT: 4-6 weeks  OBJECTIVE:   DIAGNOSTIC FINDINGS: L knee radiographs taken on 01/15/2023. Results are pending  PATIENT SURVEYS:  FOTO Patient's Physical FS Primary Measure 49;  Risk Adjusted Statistical FOTO* 50; Predicted Discharge FS Score 58  COGNITION: Overall cognitive status: Within functional limits for tasks assessed     SENSATION: Not tested  PALPATION: Generally tender throughout the lower extremity musculature; L musculature does not seem more painful than R  LOWER EXTREMITY ROM:  Passive ROM Right eval Left eval  Hip flexion unrestricted unrestricted  Hip extension unrestricted unrestricted  Hip abduction    Hip adduction    Hip internal rotation 15 15*  Hip external rotation unrestricted unrestricted  Knee flexion unrestricted 100  Knee extension unrestricted unrestricted*  Ankle dorsiflexion unrestricted unrestricted  Ankle plantarflexion unrestricted unrestricted  Ankle inversion    Ankle eversion     (Blank rows = not tested)  *L knee pain  noted with L hip internal rotation testing  *L knee pain noted with overpressure into knee extension  LOWER EXTREMITY MMT:  MMT Right eval Left eval  Hip flexion 4/5 4/5  Hip extension 4+/5 4+/5  Hip abduction 4/5 4/5  Hip adduction 5/5 3+/5  Hip internal rotation 4/5 4/5  Hip external rotation 4/5 4/5  Knee flexion 4/5 4/5  Knee extension 5-/5 5-/5  Ankle dorsiflexion    Ankle plantarflexion 5/5 4+/5  Ankle inversion    Ankle eversion     (Blank rows = not tested)  *No reproduction of L knee pain with testing of contractile structures  TODAY'S TREATMENT:                                                                                                                              DATE: 01/18/2023  Therapeutic exercise (20 min): Instructed and performed home exercise program:  1) Sidelying hip adduction, L side, x 10 reps (will perform 3 sets at home)  2) Seated knee extension isometric at wall, 3x30 sec holds  3) Seated plantarflexion isometric at wall, 3x30 sec holds  4) Seated hamstring isometric at wall, 3x30 sec holds   PATIENT EDUCATION:  Education details: Examination findings and plan of care Person educated: Patient Education method: Explanation Education comprehension: verbalized understanding  HOME EXERCISE PROGRAM: Access Code: [email or web address] URL: [email or web address] Date: 01/18/2023 Prepared by: Edmonia Caprio  Exercises - Sidelying Hip Adduction  - 1 x daily - 7 x weekly - 3 sets - 30-60 sec hold - Seated knee extension set at wall  - 1 x daily - 7 x weekly - 3 sets - 30-60 sec hold - Seated plantarflexion set at wall  - 1 x daily - 7 x weekly -  3 sets - 30-60 sec hold - Seated hamstring set at wall  - 1 x daily - 7 x weekly - 3 sets - 30-60 sec hold  ASSESSMENT:  CLINICAL IMPRESSION: Patient is a 82 y.o. who was seen today for physical therapy evaluation and treatment for L knee pain with referring diagnosis of L knee osteoarthritis. She  demonstrated global weakness of the R and L lower extremities, with the exception of L hip adduction, which was markedly weaker on the L than the R. L knee flexion was notably more restricted than the R. Hip ROM was fairly symmetrical, but L hip internal rotation did produce some L knee pain - will consider over the plan of care as it was difficult to determine if this was referred pain from the L hip or L knee pain related to the testing position. She did also have pain/tenderness with soft tissue palpation throughout the lower extremities but this also seemed symmetrical L and R. The lumbar region was not screened today but will also be considered at future visits given history of lumbar fusion and potential to contribute to L knee symptoms. Plan to focus on lower extremity strengthening R and L and manual therapy/joint mobilizations to improve L knee ROM. Balance was not formally tested today, but will be a focus of therapy sessions as patient reports unsteadiness on feet.  OBJECTIVE IMPAIRMENTS: decreased balance, difficulty walking, decreased ROM, decreased strength, and pain.   ACTIVITY LIMITATIONS: squatting and stairs  PARTICIPATION LIMITATIONS: cleaning, community activity, and yard work  PERSONAL FACTORS: 1-2 comorbidities: history of lumbar fusion, side effects from medication infusions  are also potentially affecting patient's functional outcome.   REHAB POTENTIAL: Good  CLINICAL DECISION MAKING: Stable/uncomplicated  EVALUATION COMPLEXITY: Low   GOALS: Goals reviewed with patient? No  SHORT TERM GOALS: Target date: 02/01/2023  Patient will report at least a 50% improvement in L knee symptoms with daily activities. Baseline: Goal status: INITIAL  2.  Patient will improve L hip adduction strength to at least 4/5 to improve activity participation. Baseline:  Goal status: INITIAL  LONG TERM GOALS: Target date: 02/15/2023   Patient will report at least a 75% improvement in  symptoms at the L knee with daily activities. Baseline:  Goal status: INITIAL  2.  Patient will return to exercises classes at the senior center with no more than mild interference from L knee symptoms. Baseline:  Goal status: INITIAL  3.  Patient will reach a FOTO score of at least 58 Baseline:  Goal status: INITIAL  PLAN:  PT FREQUENCY: 1-2x/week  PT DURATION: 4 weeks  PLANNED INTERVENTIONS: Therapeutic exercises, Therapeutic activity, Neuromuscular re-education, Balance training, Gait training, Patient/Family education, Self Care, Joint mobilization, Stair training, Electrical stimulation, Moist heat, Taping, and Manual therapy  PLAN FOR NEXT SESSION: Lower extremity strengthening, balance training, manual therapy/joint mobilizations to improve L knee flexion, consider further assessment/treatment of L hip and/or lumbar spine if patient does not progress as expected.  Edmonia Caprio, PT, PhD, DPT  01/18/2023, 11:57 AM

## 2023-01-22 ENCOUNTER — Other Ambulatory Visit: Payer: Self-pay | Admitting: Family Medicine

## 2023-01-22 DIAGNOSIS — F321 Major depressive disorder, single episode, moderate: Secondary | ICD-10-CM

## 2023-01-23 DIAGNOSIS — G3184 Mild cognitive impairment, so stated: Secondary | ICD-10-CM | POA: Diagnosis not present

## 2023-01-23 DIAGNOSIS — Z006 Encounter for examination for normal comparison and control in clinical research program: Secondary | ICD-10-CM | POA: Diagnosis not present

## 2023-01-24 DIAGNOSIS — H16223 Keratoconjunctivitis sicca, not specified as Sjogren's, bilateral: Secondary | ICD-10-CM | POA: Diagnosis not present

## 2023-01-29 ENCOUNTER — Ambulatory Visit: Payer: Medicare Other

## 2023-01-29 DIAGNOSIS — G8929 Other chronic pain: Secondary | ICD-10-CM | POA: Diagnosis not present

## 2023-01-29 DIAGNOSIS — M25662 Stiffness of left knee, not elsewhere classified: Secondary | ICD-10-CM | POA: Diagnosis not present

## 2023-01-29 DIAGNOSIS — R2681 Unsteadiness on feet: Secondary | ICD-10-CM | POA: Diagnosis not present

## 2023-01-29 DIAGNOSIS — M25562 Pain in left knee: Secondary | ICD-10-CM | POA: Diagnosis not present

## 2023-01-29 DIAGNOSIS — M1712 Unilateral primary osteoarthritis, left knee: Secondary | ICD-10-CM | POA: Diagnosis not present

## 2023-01-29 NOTE — Therapy (Signed)
OUTPATIENT PHYSICAL THERAPY LOWER EXTREMITY TREATMENT  Patient Name: Carol Perez MRN: [id number] DOB:04-30-1941, 82 y.o., female Today's Date: 01/29/2023  END OF SESSION:  PT End of Session - 01/29/23 0918     Visit Number 2    Number of Visits 8    Date for PT Re-Evaluation 02/15/23    Authorization Type [employer] Medicare    Progress Note Due on Visit 10    PT Start Time 0920    PT Stop Time 1005    PT Time Calculation (min) 45 min             Past Medical History:  Diagnosis Date   Barrett's esophagus    Colitis    DDD (degenerative disc disease)    Factor 5 Leiden mutation, heterozygous (HCC)    Per [facility] New Patient Packet.   Frequent UTI    H/O echocardiogram 04/01/2019   Per [facility] New Patient Packet.   Hiatal hernia    Per [facility] New Patient Packet.   High grade dysplasia of Barrett's epithelium    Per [facility] New Patient Packet.   History of bladder infections    Per [facility] New Patient Packet.   History of Papanicolaou smear of cervix 10/16/2011   Per [facility] New Patient Packet   Hypoglycemia    Hypotension    Per [facility] New Patient Packet.   IBS (irritable bowel syndrome)    Per [facility] New Patient Packet.   Lactose intolerance    Osteoarthritis    Per [facility] New Patient Packet.   Osteopenia    Per [facility] New Patient Packet.   Osteoporosis    Premature ventricular complex    Raynaud's disease    Per [facility] New Patient Packet.   Scoliosis    Per [facility] New Patient Packet.   Past Surgical History:  Procedure Laterality Date   ABDOMINAL EXPOSURE N/A 08/12/2020   Procedure: ABDOMINAL EXPOSURE;  Surgeon: Nada Libman, MD;  Location: [facility] OR;  Service: Vascular;  Laterality: N/A;   ABDOMINAL HYSTERECTOMY  06/13/2011   By Dr.Scherer at [facility]. Per [facility] New Patient Packet.   ANTERIOR LAT LUMBAR FUSION Right 08/12/2020   Procedure: Right Lumbar two-three, Lumbar three-four, Lumbar four-five Anterolateral lumbar interbody fusion;  Surgeon: Maeola Harman, MD;  Location: [facility] OR;  Service:  Neurosurgery;  Laterality: Right;   ANTERIOR LUMBAR FUSION N/A 08/12/2020   Procedure: Lumbar five Sacral one Anterior lumbar interbody fusion;  Surgeon: Maeola Harman, MD;  Location: [facility] OR;  Service: Neurosurgery;  Laterality: N/A;   BREAST EXCISIONAL BIOPSY Left 30+ yrs ago   COLONOSCOPY  11/23/2011   By Dr.McCune at [facility]. Per [facility] New Patient Packet.   COLONOSCOPY  08/26/2019   LUMBAR PERCUTANEOUS PEDICLE SCREW 4 LEVEL N/A 08/12/2020   Procedure: Percutaneous pedicle screw fixation from Lumbar two to Sacral one ;  Surgeon: Maeola Harman, MD;  Location: [facility] OR;  Service: Neurosurgery;  Laterality: N/A;   MAMMOGRAM  04/14/2019   pap smear  10/16/2011   SIGMOIDOSCOPY  11/02/2014   Per [facility] New Patient Packet   TONSILLECTOMY  06/12/1948   Per [facility] New Patient Packet.   Patient Active Problem List   Diagnosis Date Noted   screen for Dalene Carrow syndrome 11/13/2022   Long term use of drug 11/13/2022   Restless legs 10/18/2022   Sleep related leg cramps 10/18/2022   IFG (impaired fasting glucose) 07/10/2022   Neuropathy of both feet 07/10/2022   Dry eye 07/10/2022   Alzheimer disease (  HCC) 05/16/2022   FHx: Alzheimer's disease 05/16/2022   Nocturnal leg cramps 11/23/2021   Overactive bladder 11/23/2021   Depression, recurrent (HCC) 08/24/2020   Atrophic vaginitis 08/24/2020   Blood loss anemia 08/24/2020   Hyponatremia    Lumbar scoliosis 08/12/2020   Occipital neuralgia of right side 09/08/2019   Recurrent UTI 06/26/2019   Osteoporosis 06/26/2019   IBS (irritable bowel syndrome) 06/26/2019   Hyperlipidemia 06/26/2019   Back pain 06/26/2019   Barrett's esophagus 06/26/2019   GERD (gastroesophageal reflux disease) 05/01/2019   Post-menopause 05/01/2019   Chondromalacia patellae 01/16/2013   Primary osteoarthritis of left knee 01/16/2013   Neoplasm 06/24/2012   Midline cystocele 05/14/2012    PCP: Agapito Games, MD  REFERRING PROVIDER: Agapito Games, *  REFERRING DIAG:  [phone] (ICD-10-CM) - Primary osteoarthritis of left knee    THERAPY DIAG:  Unsteadiness on feet  Chronic pain of left knee  Stiffness of left knee, not elsewhere classified  Rationale for Evaluation and Treatment: Rehabilitation  ONSET DATE: Subacute  SUBJECTIVE:   SUBJECTIVE STATEMENT: Patient reports 4/10 pain in L knee today; states both her knees are feeling better today since eval, states HEP has already made a difference in pain/mobiltiy. Patient states she has issues tripping over her L foot and states she is worried about falling as a result. Patient states she switched from heat to ice packs as recommended by Dr. Karie Schwalbe.   PERTINENT HISTORY: 2022 - Lumbar fusion L2-S1 PAIN:  Current pain: 1/10 NPRS Max pain: 7-8/10 NPRS  PRECAUTIONS: None  RED FLAGS: None   WEIGHT BEARING RESTRICTIONS: No  FALLS:  Has patient fallen in last 6 months? No  LIVING ENVIRONMENT: Lives with: lives with their family and lives with their spouse Lives in: House/apartment Stairs: No Has following equipment at home: None Moving into a retirement center in [place] in near future  OCCUPATION: Retired  PLOF: Independent  PATIENT GOALS: decrease pain, improve balance, return to exercise classes  NEXT MD VISIT: 4-6 weeks  OBJECTIVE:   DIAGNOSTIC FINDINGS: L knee radiographs taken on 01/15/2023. Results are pending  PATIENT SURVEYS:  FOTO Patient's Physical FS Primary Measure 49;  Risk Adjusted Statistical FOTO* 50; Predicted Discharge FS Score 58  COGNITION: Overall cognitive status: Within functional limits for tasks assessed     SENSATION: Not tested  PALPATION: Generally tender throughout the lower extremity musculature; L musculature does not seem more painful than R  LOWER EXTREMITY ROM:  Passive ROM Right eval Left eval  Hip flexion unrestricted unrestricted  Hip extension unrestricted unrestricted  Hip abduction    Hip adduction     Hip internal rotation 15 15*  Hip external rotation unrestricted unrestricted  Knee flexion unrestricted 100  Knee extension unrestricted unrestricted*  Ankle dorsiflexion unrestricted unrestricted  Ankle plantarflexion unrestricted unrestricted  Ankle inversion    Ankle eversion     (Blank rows = not tested)  *L knee pain noted with L hip internal rotation testing  *L knee pain noted with overpressure into knee extension  LOWER EXTREMITY MMT:  MMT Right eval Left eval  Hip flexion 4/5 4/5  Hip extension 4+/5 4+/5  Hip abduction 4/5 4/5  Hip adduction 5/5 3+/5  Hip internal rotation 4/5 4/5  Hip external rotation 4/5 4/5  Knee flexion 4/5 4/5  Knee extension 5-/5 5-/5  Ankle dorsiflexion    Ankle plantarflexion 5/5 4+/5  Ankle inversion    Ankle eversion     (Blank rows = not tested)  *  No reproduction of L knee pain with testing of contractile structures  TODAY'S TREATMENT: [facility] Adult PT Treatment:                                                DATE: 01/29/2023 Therapeutic Exercise: Recumbent bike L 2 x Mat Table: SLR (small range) 10x5" (B) SAQ (green bolster) 4#AW 10x3" (B) Hooklying hip add iso (ball squeeze) 10x5" Bridges + ball squeeze x10 Hooklying LAQ + ball squeeze x10 (B) Seated:  Resisted knee extension RTB 10x3" (B)  HS set (ball against wall) PF iso (ball against wall) Standing: Alt heel/toe raises x20 Resisted step out YTB crossed at ankles (fwd/bkwd diagonal) x6 (B)                                                                                                                                   DATE: 01/18/2023  Therapeutic exercise (20 min): Instructed and performed home exercise program:  1) Sidelying hip adduction, L side, x 10 reps (will perform 3 sets at home)  2) Seated knee extension isometric at wall, 3x30 sec holds  3) Seated plantarflexion isometric at wall, 3x30 sec holds  4) Seated hamstring isometric at wall, 3x30 sec holds    PATIENT EDUCATION:  Education details: Examination findings and plan of care Person educated: Patient Education method: Explanation Education comprehension: verbalized understanding  HOME EXERCISE PROGRAM: Access Code: [email or web address] URL: [email or web address] Date: 01/29/2023 Prepared by: Carlynn Herald  Exercises - Sidelying Hip Adduction  - 1 x daily - 7 x weekly - 3 sets - 10 reps - Seated Long Arc Quad  - 1 x daily - 7 x weekly - 3 sets - 10 reps - Long Sitting Isometric Ankle Plantarflexion with Ball at [employer]  - 1 x daily - 7 x weekly - 3 sets - 10 reps - Seated Hamstring Set  - 1 x daily - 7 x weekly - 3 sets - 1 reps - 30-60 hold - Supine Knee Extension Strengthening  - 1 x daily - 7 x weekly - 1 sets - 10 reps - 5 sec hold - Supine Bridge with Mini Swiss Ball Between Knees  - 1 x daily - 7 x weekly - 3 sets - 10 reps - 3 sec hold - Seated Knee Extension with Resistance  - 1 x daily - 7 x weekly - 3 sets - 10 reps - 3-5 sec hold - Heel Toe Raises with Counter Support  - 1 x daily - 7 x weekly - 3 sets - 10 reps - Single Leg Step Outs with Resistance  - 1 x daily - 7 x weekly - 3 sets - 10 reps  ASSESSMENT:  CLINICAL IMPRESSION: Exercises progressed with focus on quad and hip strengthening. Cueing increased hip add isometric activation  during bridges alleviate L medial knee pain. Functional ankle strengthening addressed with standing heel/toe raises. HEP updated with new exercises to progress LE strength and resistance bands provided with instruction on how to progress exercises as tolerated.  EVAL: Patient is a 82 y.o. who was seen today for physical therapy evaluation and treatment for L knee pain with referring diagnosis of L knee osteoarthritis. She demonstrated global weakness of the R and L lower extremities, with the exception of L hip adduction, which was markedly weaker on the L than the R. L knee flexion was notably more restricted than the R. Hip ROM was fairly  symmetrical, but L hip internal rotation did produce some L knee pain - will consider over the plan of care as it was difficult to determine if this was referred pain from the L hip or L knee pain related to the testing position. She did also have pain/tenderness with soft tissue palpation throughout the lower extremities but this also seemed symmetrical L and R. The lumbar region was not screened today but will also be considered at future visits given history of lumbar fusion and potential to contribute to L knee symptoms. Plan to focus on lower extremity strengthening R and L and manual therapy/joint mobilizations to improve L knee ROM. Balance was not formally tested today, but will be a focus of therapy sessions as patient reports unsteadiness on feet.  OBJECTIVE IMPAIRMENTS: decreased balance, difficulty walking, decreased ROM, decreased strength, and pain.   ACTIVITY LIMITATIONS: squatting and stairs  PARTICIPATION LIMITATIONS: cleaning, community activity, and yard work  PERSONAL FACTORS: 1-2 comorbidities: history of lumbar fusion, side effects from medication infusions  are also potentially affecting patient's functional outcome.   REHAB POTENTIAL: Good  CLINICAL DECISION MAKING: Stable/uncomplicated  EVALUATION COMPLEXITY: Low   GOALS: Goals reviewed with patient? No  SHORT TERM GOALS: Target date: 02/01/2023  Patient will report at least a 50% improvement in L knee symptoms with daily activities. Baseline: Goal status: INITIAL  2.  Patient will improve L hip adduction strength to at least 4/5 to improve activity participation. Baseline:  Goal status: INITIAL  LONG TERM GOALS: Target date: 02/15/2023   Patient will report at least a 75% improvement in symptoms at the L knee with daily activities. Baseline:  Goal status: INITIAL  2.  Patient will return to exercises classes at the senior center with no more than mild interference from L knee symptoms. Baseline:  Goal  status: INITIAL  3.  Patient will reach a FOTO score of at least 58 Baseline:  Goal status: INITIAL  PLAN:  PT FREQUENCY: 1-2x/week  PT DURATION: 4 weeks  PLANNED INTERVENTIONS: Therapeutic exercises, Therapeutic activity, Neuromuscular re-education, Balance training, Gait training, Patient/Family education, Self Care, Joint mobilization, Stair training, Electrical stimulation, Moist heat, Taping, and Manual therapy  PLAN FOR NEXT SESSION: Lower extremity strengthening, balance training, manual therapy/joint mobilizations to improve L knee flexion, consider further assessment/treatment of L hip and/or lumbar spine if patient does not progress as expected.  Carlynn Herald, PTA 01/29/2023, 10:11 AM

## 2023-01-30 ENCOUNTER — Emergency Department (HOSPITAL_COMMUNITY): Payer: Medicare Other

## 2023-01-30 ENCOUNTER — Emergency Department (HOSPITAL_COMMUNITY)
Admission: EM | Admit: 2023-01-30 | Discharge: 2023-01-31 | Disposition: A | Discharge status: Home / Self Care | Payer: Medicare Other | Attending: Emergency Medicine | Admitting: Emergency Medicine

## 2023-01-30 ENCOUNTER — Encounter (HOSPITAL_COMMUNITY): Payer: Self-pay

## 2023-01-30 DIAGNOSIS — E876 Hypokalemia: Secondary | ICD-10-CM | POA: Diagnosis not present

## 2023-01-30 DIAGNOSIS — Z743 Need for continuous supervision: Secondary | ICD-10-CM | POA: Diagnosis not present

## 2023-01-30 DIAGNOSIS — R410 Disorientation, unspecified: Secondary | ICD-10-CM | POA: Diagnosis not present

## 2023-01-30 DIAGNOSIS — R404 Transient alteration of awareness: Secondary | ICD-10-CM | POA: Insufficient documentation

## 2023-01-30 DIAGNOSIS — Z471 Aftercare following joint replacement surgery: Secondary | ICD-10-CM | POA: Diagnosis not present

## 2023-01-30 DIAGNOSIS — Z7982 Long term (current) use of aspirin: Secondary | ICD-10-CM | POA: Diagnosis not present

## 2023-01-30 DIAGNOSIS — R4182 Altered mental status, unspecified: Secondary | ICD-10-CM | POA: Diagnosis present

## 2023-01-30 DIAGNOSIS — R0902 Hypoxemia: Secondary | ICD-10-CM | POA: Diagnosis not present

## 2023-01-30 DIAGNOSIS — R6889 Other general symptoms and signs: Secondary | ICD-10-CM | POA: Diagnosis not present

## 2023-01-30 DIAGNOSIS — I672 Cerebral atherosclerosis: Secondary | ICD-10-CM | POA: Diagnosis not present

## 2023-01-30 DIAGNOSIS — G459 Transient cerebral ischemic attack, unspecified: Secondary | ICD-10-CM | POA: Diagnosis not present

## 2023-01-30 DIAGNOSIS — R197 Diarrhea, unspecified: Secondary | ICD-10-CM | POA: Diagnosis not present

## 2023-01-30 DIAGNOSIS — Z91040 Latex allergy status: Secondary | ICD-10-CM | POA: Insufficient documentation

## 2023-01-30 DIAGNOSIS — R9431 Abnormal electrocardiogram [ECG] [EKG]: Secondary | ICD-10-CM | POA: Diagnosis not present

## 2023-01-30 LAB — CBC WITH DIFFERENTIAL/PLATELET
Abs Immature Granulocytes: 0.03 10*3/uL (ref 0.00–0.07)
Basophils Absolute: 0.1 10*3/uL (ref 0.0–0.1)
Basophils Relative: 1 %
Eosinophils Absolute: 0 10*3/uL (ref 0.0–0.5)
Eosinophils Relative: 1 %
HCT: 38.7 % (ref 36.0–46.0)
Hemoglobin: 12.2 g/dL (ref 12.0–15.0)
Immature Granulocytes: 1 %
Lymphocytes Relative: 22 %
Lymphs Abs: 1.4 10*3/uL (ref 0.7–4.0)
MCH: 32.3 pg (ref 26.0–34.0)
MCHC: 31.5 g/dL (ref 30.0–36.0)
MCV: 102.4 fL — ABNORMAL HIGH (ref 80.0–100.0)
Monocytes Absolute: 0.6 10*3/uL (ref 0.1–1.0)
Monocytes Relative: 10 %
Neutro Abs: 4 10*3/uL (ref 1.7–7.7)
Neutrophils Relative %: 65 %
Platelets: 298 10*3/uL (ref 150–400)
RBC: 3.78 MIL/uL — ABNORMAL LOW (ref 3.87–5.11)
RDW: 12.4 % (ref 11.5–15.5)
WBC: 6 10*3/uL (ref 4.0–10.5)
nRBC: 0 % (ref 0.0–0.2)

## 2023-01-30 LAB — COMPREHENSIVE METABOLIC PANEL
ALT: 19 U/L (ref 0–44)
AST: 30 U/L (ref 15–41)
Albumin: 4.1 g/dL (ref 3.5–5.0)
Alkaline Phosphatase: 65 U/L (ref 38–126)
Anion gap: 12 (ref 5–15)
BUN: 10 mg/dL (ref 8–23)
CO2: 24 mmol/L (ref 22–32)
Calcium: 9.2 mg/dL (ref 8.9–10.3)
Chloride: 100 mmol/L (ref 98–111)
Creatinine, Ser: 0.83 mg/dL (ref 0.44–1.00)
GFR, Estimated: >60 mL/min (ref >60)
Glucose, Bld: 101 mg/dL — ABNORMAL HIGH (ref 70–99)
Potassium: 3.4 mmol/L — ABNORMAL LOW (ref 3.5–5.1)
Sodium: 136 mmol/L (ref 135–145)
Total Bilirubin: 0.4 mg/dL (ref 0.3–1.2)
Total Protein: 6.9 g/dL (ref 6.5–8.1)

## 2023-01-30 LAB — CBG MONITORING, ED: Glucose-Capillary: 77 mg/dL (ref 70–99)

## 2023-01-30 NOTE — ED Triage Notes (Signed)
Pt is coming in for supposed " worsening mentation over the last hour " reported by husband, does have a Dx hx of dementia and TIAs. Its only reported confusion for the alteration, no neuro symptoms, and passes medics stroke screen. Has had diarrhea today as well.   Medic Vitals  152/94 76hr 107bgl 18rr 98%ra  20g left fa

## 2023-01-30 NOTE — ED Provider Triage Note (Signed)
Emergency Medicine Provider Triage Evaluation Note  Carol Perez , a 82 y.o. female  was evaluated in triage.  Pt complains of altered mental status.  Patient is apparently at the kitchen and then collapsed suddenly.  Per the husband, she had some slurred speech at that time.  She had a stroke previously with similar symptoms.  Patient states that she feeling fine right now.  She did not remember what happened  Review of Systems  Positive: Loss of consciousness Negative: Weakness  Physical Exam  BP (!) 180/84   Pulse 79   Temp 98 F (36.7 C) (Oral)   Resp 18   SpO2 99%  Gen:   Awake, no distress   Resp:  Normal effort  MSK:   Moves extremities without difficulty  Other:    Medical Decision Making  Medically screening exam initiated at 7:51 PM.  Appropriate orders placed.  Carol Perez was informed that the remainder of the evaluation will be completed by another provider, this initial triage assessment does not replace that evaluation, and the importance of remaining in the ED until their evaluation is complete.  Carol Perez is a 82 y.o. female who presented with loss of consciousness.  Unclear if she had a seizure versus syncope versus TIA.  Patient is back to baseline.  Does not qualify for code stroke right now.  Will get labs and CT head and MRI brain.    Charlynne Pander, MD 01/30/23 [phone]

## 2023-01-31 ENCOUNTER — Telehealth: Payer: Self-pay | Admitting: Adult Health

## 2023-01-31 LAB — URINALYSIS, ROUTINE W REFLEX MICROSCOPIC
Bacteria, UA: NONE SEEN
Bilirubin Urine: NEGATIVE
Glucose, UA: NEGATIVE mg/dL
Hgb urine dipstick: NEGATIVE
Ketones, ur: NEGATIVE mg/dL
Nitrite: NEGATIVE
Protein, ur: NEGATIVE mg/dL
Specific Gravity, Urine: 1.009 (ref 1.005–1.030)
pH: 6 (ref 5.0–8.0)

## 2023-01-31 NOTE — Telephone Encounter (Signed)
Pt was scheduled for Nov for a f/u but called to move it sooner due to having a hospital visit. Pt states she had what she calls "spells" and she is fearing it has to do with her infusions.

## 2023-01-31 NOTE — Telephone Encounter (Signed)
Spoke to pt states went to ED yesterday  due to having increased confusion Pt states had MRI and CT scan and both were negative Pt states was told her BP was elevated Informed pt call pcp to make aware Pt states her next Laqembi infusion is Sept 27th 2024 and her next office visit with Megan,NP is Sept 4th 2024  Informed pt if she has  those symptoms again please go to ED to get evaluated Pt expressed understanding and thanked me for calling

## 2023-01-31 NOTE — ED Provider Notes (Signed)
[name] Provider Note   CSN: [id number] Arrival date & time: 01/30/23  1918     History  Chief Complaint  Patient presents with   Altered Mental Status    Carol Perez is a 82 y.o. female.  The history is provided by the patient, the spouse and medical records.  Altered Mental Status Carol Perez is a 82 y.o. female who presents to the Emergency Department complaining of altered mental status.  She presents to the emergency department for evaluation of 10-minute episode of altered mental status.  She was doing well throughout the day aside from 1 episode of loose stool.  This evening she was getting ready to prepare dinner when she did not feel well and called her husband.  He came to her side and assisted her to the couch.  She had about 10 minutes of persistent repetitive questioning, inquiring about events throughout the day that she did not recall.  He called EMS.  He states that she has returned to baseline since this event occurred.  No prior similar episodes.  She reports feeling fatigued currently but it is late in the day.  No fevers, chest pain, nausea, vomiting.  She did have a headache earlier, none currently.  No dysuria.  She is currently followed for early Alzheimer's and is on infusion therapy. Per husband during the event there was no loss of muscle tone or abnormal breathing or movements.    Home Medications Prior to Admission medications   Medication Sig Start Date End Date Taking? Authorizing Provider  acetaminophen (TYLENOL) 650 MG CR tablet Take 1 tablet (650 mg total) by mouth every 8 (eight) hours as needed for pain. 01/15/23   Monica Becton, MD  aspirin EC 81 MG tablet Take 81 mg by mouth 3 (three) times daily.    [provider]  atorvastatin (LIPITOR) 10 MG tablet TAKE ONE (1) TABLET BY MOUTH EACH DAY 01/01/23   Agapito Games, MD  CALCIUM PO Take 1,200 mg by mouth daily.     [provider]  conjugated estrogens (PREMARIN) vaginal cream Place 1 applicator vaginally once a week.    [provider]  DULoxetine (CYMBALTA) 30 MG capsule TAKE ONE (1) CAPSULE EACH DAY BY MOUTH 01/22/23   Agapito Games, MD  methenamine (HIPREX) 1 g tablet Take 1 g by mouth 2 (two) times daily with a meal. For UTI Prevention. Last UTI: October 20th, November 5th, and December 28th.    [provider]  Multiple Vitamin (MULTIVITAMIN WITH MINERALS) TABS tablet Take 1 tablet by mouth daily. One A Day for Women    [provider]  Polyethyl Glycol-Propyl Glycol (SYSTANE) 0.4-0.3 % SOLN Place 1 drop into both eyes as needed (dry/irritated eyes).    [provider]  psyllium (METAMUCIL) 58.6 % powder Take 1 packet by mouth 3 (three) times daily.    [provider]  valACYclovir (VALTREX) 500 MG tablet Take 1 tablet (500 mg total) by mouth daily as needed. 01/17/23   Agapito Games, MD      Allergies    Banana, Latex, Ciprofloxacin hcl, Gatifloxacin, Levofloxacin, Moxifloxacin, Norfloxacin, Nsaids, Ofloxacin, Penicillins, Bactrim [sulfamethoxazole-trimethoprim], Banana extract allergy skin test, Donepezil, Pregabalin, Methylisothiazolinone, and Tape    Review of Systems   Review of Systems  All other systems reviewed and are negative.   Physical Exam Updated Vital Signs BP (!) 148/74 (BP Location: Left Arm)   Pulse  77   Temp 98 F (36.7 C)   Resp 18   SpO2 98%  Physical Exam Vitals and nursing note reviewed.  Constitutional:      Appearance: She is well-developed.  HENT:     Head: Normocephalic and atraumatic.  Cardiovascular:     Rate and Rhythm: Normal rate and regular rhythm.     Heart sounds: No murmur heard. Pulmonary:     Effort: Pulmonary effort is normal. No respiratory distress.     Breath sounds: Normal breath sounds.  Abdominal:     Palpations: Abdomen is soft.     Tenderness: There is no abdominal tenderness.  There is no guarding or rebound.  Musculoskeletal:        General: No tenderness.  Skin:    General: Skin is warm and dry.  Neurological:     Mental Status: She is alert and oriented to person, place, and time.     Comments: No asymmetry of facial movements.  Visual fields grossly intact.  5 out of 5 strength in all 4 extremities with sensation to light touch intact in all 4 extremities  Psychiatric:        Behavior: Behavior normal.     ED Results / Procedures / Treatments   Labs (all labs ordered are listed, but only abnormal results are displayed) Labs Reviewed  CBC WITH DIFFERENTIAL/PLATELET - Abnormal; Notable for the following components:      Result Value   RBC 3.78 (*)    MCV 102.4 (*)    All other components within normal limits  COMPREHENSIVE METABOLIC PANEL - Abnormal; Notable for the following components:   Potassium 3.4 (*)    Glucose, Bld 101 (*)    All other components within normal limits  URINALYSIS, ROUTINE W REFLEX MICROSCOPIC - Abnormal; Notable for the following components:   Leukocytes,Ua TRACE (*)    All other components within normal limits  CBG MONITORING, ED    EKG None  Radiology MR BRAIN WO CONTRAST  Result Date: 01/30/2023 CLINICAL DATA:  Transient ischemic attack EXAM: MRI HEAD WITHOUT CONTRAST TECHNIQUE: Multiplanar, multiecho pulse sequences of the brain and surrounding structures were obtained without intravenous contrast. COMPARISON:  01/03/2023 MRI head, correlation is also made with CT head 01/30/2023 FINDINGS: Brain: No restricted diffusion to suggest acute or subacute infarct. No acute hemorrhage, mass, mass effect, or midline shift. No hydrocephalus or extra-axial collection. Normal pituitary and craniocervical junction. No hemosiderin deposition to suggest remote hemorrhage. T2 hyperintense signal in the periventricular white matter, likely the sequela of mild to moderate chronic small vessel ischemic disease. Vascular: Normal arterial  flow voids. Skull and upper cervical spine: Normal marrow signal. Sinuses/Orbits: Clear paranasal sinuses. Status post bilateral lens replacements. Other: The mastoid air cells are well aerated. IMPRESSION: No acute intracranial process. No evidence of acute or subacute infarct. Electronically Signed   By: Wiliam Ke M.D.   On: 01/30/2023 23:57   CT HEAD WO CONTRAST ( )  Result Date: 01/30/2023 CLINICAL DATA:  Mental status changes, unknown cause. Worsening mentation over past hour reported by husband. EXAM: CT HEAD WITHOUT CONTRAST TECHNIQUE: Contiguous axial images were obtained from the base of the skull through the vertex without intravenous contrast. RADIATION DOSE REDUCTION: This exam was performed according to the departmental dose-optimization program which includes automated exposure control, adjustment of the mA and/or kV according to patient size and/or use of iterative reconstruction technique. COMPARISON:  MRI brain 01/03/2023 FINDINGS: Brain: There is mild atrophy, small-vessel disease and atrophic  ventriculomegaly commensurate with age. No new asymmetry is seen worrisome for a cortical based acute infarct, hemorrhage, mass, mass effect or midline shift. An asymmetric perivascular space is again noted in the left basal ganglia. Vascular: There are moderate calcific plaques in both siphons both distal vertebral arteries. No hyperdense central vessel is seen. Skull: Negative for fractures or focal lesions. Sinuses/Orbits: No acute findings. Old lens replacements. Clear sinuses and mastoids with midline nasal septum. Other: None. IMPRESSION: Atrophy and small-vessel disease. No acute intracranial CT findings or interval changes. Electronically Signed   By: Almira Bar M.D.   On: 01/30/2023 21:27    Procedures Procedures    Medications Ordered in ED Medications - No data to display  ED Course/ Medical Decision Making/ A&P                                 Medical Decision  Making  Patient here for evaluation of transient alteration of awareness.  She is at her baseline on evaluation in the emergency department.  MRI is negative for acute process.  UA not consistent with UTI.  BMP with mild hypokalemia.  Discussed with patient, spouse unclear source of symptoms.  Her symptoms have completely resolved at this time.  She is followed currently by neurology.  Episode does not appear to be consistent with seizure or TIA at this time.  Plan to discharge home with outpatient follow-up and return precautions.        Final Clinical Impression(s) / ED Diagnoses Final diagnoses:  Transient alteration of awareness    Rx / DC Orders ED Discharge Orders     None         Tilden Fossa, MD 01/31/23 [phone]

## 2023-02-05 ENCOUNTER — Ambulatory Visit: Payer: Medicare Other

## 2023-02-05 NOTE — Telephone Encounter (Signed)
Aundra Millet, if you would like to discuss please let me know thanks

## 2023-02-06 DIAGNOSIS — G3184 Mild cognitive impairment, so stated: Secondary | ICD-10-CM | POA: Diagnosis not present

## 2023-02-06 DIAGNOSIS — Z006 Encounter for examination for normal comparison and control in clinical research program: Secondary | ICD-10-CM | POA: Diagnosis not present

## 2023-02-08 ENCOUNTER — Ambulatory Visit: Payer: Medicare Other

## 2023-02-09 ENCOUNTER — Telehealth: Payer: Self-pay

## 2023-02-09 NOTE — Telephone Encounter (Signed)
Transition Care Management Unsuccessful Follow-up Telephone Call  Date of discharge and from where:  01/31/2023 [facility]  Attempts:  1st Attempt  Reason for unsuccessful TCM follow-up call:  Left voice message  Divina Neale [facility]  [facility] Population Health Community Resource Care Guide   [email or web address]  ?? [phone]   Website: [email or web address]  [email or web address]

## 2023-02-09 NOTE — Telephone Encounter (Signed)
Transition Care Management Unsuccessful Follow-up Telephone Call  Date of discharge and from where:  01/31/2023 [facility]  Attempts:  2nd Attempt  Reason for unsuccessful TCM follow-up call:  Left voice message  Carol Perez [facility]  [facility] Population Health Community Resource Care Guide   [email or web address]  ?? [phone]   Website: [email or web address]

## 2023-02-13 ENCOUNTER — Encounter: Payer: Self-pay | Admitting: Sports Medicine

## 2023-02-13 ENCOUNTER — Ambulatory Visit (INDEPENDENT_AMBULATORY_CARE_PROVIDER_SITE_OTHER): Payer: Medicare Other | Admitting: Sports Medicine

## 2023-02-13 DIAGNOSIS — M7742 Metatarsalgia, left foot: Secondary | ICD-10-CM

## 2023-02-13 DIAGNOSIS — M1712 Unilateral primary osteoarthritis, left knee: Secondary | ICD-10-CM | POA: Diagnosis not present

## 2023-02-13 NOTE — Assessment & Plan Note (Signed)
Resolved with conservative treatment including formal physical therapy, arthritis Tylenol, return to see me as needed.

## 2023-02-13 NOTE — Progress Notes (Addendum)
PATIENT: Carol Perez DOB: 17-Jan-1941  REASON FOR VISIT: follow up HISTORY FROM: patient PRIMARY NEUROLOGIST: Dr. Lucia Gaskins   Chief Complaint  Patient presents with   Follow-up    Rm 4 with spouse Chrissie Noa Pt is well, reports her cognition and memory is stable since last visit. Did have recent altered mental status episode and went to ED in Aug, needs clearance to drive.  Her RLS is also stable.      HISTORY OF PRESENT ILLNESS: Today 02/13/23  ATIANA Perez is a 82 y.o. female who has been followed in this office for all timers disease. Returns today for follow-up.  She is currently on Leqembi.  Reports that a week ago she had about 10 minutes of confusion, word salad.  She did go to the emergency room.  MRI of the brain and CT scan was unremarkable.  Since then the patient has not had any additional symptoms.  Overall she feels that her memory has remained stable.  She still has trouble with names.  At home she is able to complete all ADLs independently.  She still is able to complete housework.  She manages the finances.  She does all the cooking.  Overall she feels that she is doing relatively well.remains on Leqembi. Reports the day after infusion she notices a tremor in the hands- improves by the time she is due for the next infusion.   HISTORY 10/18/2022: cognitive decline approved for lecanemab but here today for new cause neuropathy in the feet. PMHx has Recurrent UTI; Osteoporosis; IBS (irritable bowel syndrome); Hyperlipidemia; Back pain; Barrett's esophagus; Occipital neuralgia of right side; Lumbar scoliosis; Hyponatremia; Depression, recurrent (HCC); Atrophic vaginitis; Blood loss anemia; Nocturnal leg cramps; Overactive bladder; Alzheimer disease (HCC); FHx: Alzheimer's disease; IFG (impaired fasting glucose); Neuropathy of both feet; Dry eye; and GERD (gastroesophageal reflux disease) on their problem list.    I reviewed her chart for any neuropathy evaluation: I I tested her in  07/07/2021 for memory loss which also happens to coincide with some lab tests we ordered for serum neuropathy workup including B12, folate, MMA, B1, homocystine, TSH, BMP CBC. She always seems to have slightly elevated glucose, and she does have prediabetes from hemoglobin A1c in October 2023 5.9.   ABIs were normal. A few months ago started having restless legs, she has to constantly move them, and cramps in the feet. No numbness and tingling, no sensory changes. No low back pain or shooting into her legs. In March of 2022 she had back surgery but the symptoms do not coincide. She had lecanemab yesterday. Will increase lyrica for restless legs and cramps at bedtime. Check ferritin level.    Patient complains of symptoms per HPI as well as the following symptoms: sleepy  . Pertinent negatives and positives per HPI. All others negative     09/05/2022: Patient here for follow up on testing. She did not have an ApoE4 gene (e3/e3); discussed lecanumab: Patient's been diagnosed with mild cognitive impairment  PET scan showed amyloid in the brain, markers were positive in the blood for Alzheimer's disease, we performed a Moca today, MMSE, filled out all the paperwork for Lecanemab, extended visit took myself over 70 minutes today to discuss Lecantemab and fill out all the paperwork and answer all questions.  I also performed. functional assessment scale today.   We discussed the following and more, I provided literature:    The main challenge will be arranging the follow up MRIs (baseline, before 5,  7, 14th infusions).  We checked with our infusion suite and with the team and eventually found out MRI in October will be fine for the baseline MRI.  Below is directly from the literature.  I provided more literature.  They understand the risks especially the increased bleeding since patient has a significant burden of amyloid plaque in her brain and is homozygote for you for a e4 Alzheimer's gene she is at a much  higher rate of Aria up to 45%.   "Incidence of ARIA Symptomatic ARIA occurred in 3% (29/898) of patients treated with [facility] in Study 2 [see Clinical Studies (14)]. Serious symptoms associated with ARIA were reported in 0.7% (6/898) of patients treated with LEQEMBI. Including asymptomatic radiographic events, ARIA was observed in 21% (191/898) of patients treated with [facility], compared to 9% (40/981) of patients on placebo in Study 2   ApoE e4 Carrier Status and Risk of ARIA Approximately 15% of Alzheimer's disease patients are ApoE e4 homozygotes.    The incidence of ARIA was higher in ApoE e4 homozygotes (45% on LEQEMBI vs. 22% on placebo) than in heterozygotes (19% on LEQEMBI vs 9% on placebo) and noncarriers (13% on LEQEMBI vs 4% on placebo). Among patients treated with LEQEMBI, symptomatic ARIA-E occurred in 9% of ApoE e4 homozygotes compared with 2% of heterozygotes and 1% noncarriers. Serious events of ARIA occurred in 3% of ApoE e4 homozygotes, and approximately 1% of heterozygotes and noncarriers.   The recommendations on management of ARIA do not differ between ApoE e4 carriers and noncarriers [see Dosage and Administration (2.3)]. Testing for ApoE e4 status should be performed prior to initiation of treatment to inform the risk of developing ARIA. Prior to testing, prescribers should discuss with patients the risk of ARIA across genotypes and the implications of genetic testing results. Prescribers should inform patients that if genotype testing is not performed they can still be treated with [facility]; however, it cannot be determined if they are ApoE e4 homozygotes and at higher risk for ARIA. An FDA-authorized test for the detection of ApoE e4 alleles to identify patients at risk of ARIA if treated with [facility] is not currently available. Currently available tests used to identify ApoE e4 alleles may vary in accuracy and design."  07/03/2022: Patient reports Short term memory loss. Bad  direction sense. She has trouble coming up with words. She is forgetting conversations but it comes back. APoE4 was E3/E3. Had diarrhea on Ariceot. Started Namenda and started having dizziness and no ear infection she decreased the nemenda and I have advised her to stop it because dizziness still occurring. STOP NAMENDA. Has not tried the adlarity yet. FDG PET Scan was denied.    Patient complains of symptoms per HPI as well as the following symptoms: memory loss . Pertinent negatives and positives per HPI. All others negative     05/15/2022: This is a patient we have been following for patient reported memory loss.  Last time I saw her was in April, she had formal memory testing completed, largely inconclusive, she was diagnosed with mild cognitive impairment amnestic type and she has a long family history of Alzheimer's with the possibility this could be prodromal Alzheimer's.  She is here for follow-up, SHe had an episode of confusion likely due to UTI. Could not tolerate aricept, diarrhea. Other anticholinestase medications likely to do the same. Difficulty swallowing pills.  We discussed restarting Aricept patch, other medications such as Namenda, we also had a very long conversation about the new monoclonal  antibody that is on the market just approved by the FDA in July, we also talked about melatonin which is fine she has had a problem with sleeping, she wakes up with dry mouth, she snores.  We also talked about FDG PET scan and PET amyloid scan.  I answered all questions. Patient complains of symptoms per HPI as well as the following symptoms: none . Pertinent negatives and positives per HPI. All others negative   09/12/2021: Here for followup after formal memory testing completed.  Memory testing was largely inconclusive, I reviewed Dr. McDermott's notes with patient, but patient never followed back up with Dr. Jacquelyne Balint so that he could review all the testing with her which I highly recommend and  asked them to call the office.  She was diagnosed with mild cognitive impairment amnestic type, she has a long family history of Alzheimer's, could be prodromal Alzheimer's at this time unclear.  She will need to undergo a repeat likely in a year if her symptoms worsen, in the meantime given the uncertainty of her memory loss I am going to recommend an FDG PET scan which is highly sensitive and specific to Alzheimer's versus frontotemporal dementia.  No suspicion for vascular dementia given her MRI of the brain.  Anesthesia and normal cognitive aging could be contributory, but although she was diagnosed with MCI: "Test results revealed intact functioning across most cognitive domains and thinking skills assessed during this evaluation with the exception of exceptionally low performance on certain aspects of memory (i.e. new learning and free recall of contextual information and visually presented material), but with intact verbal free recall and recognition. Other intact areas included executive functions, attention and processing speed, language, and visuospatial judgment. From an emotional standpoint, on self-report measures, there appears to be at least a mild degree of recent depression.  Ms. Petri performance evidences mild neurocognitive challenges primarily manifesting as difficulties with certain aspects of learning and memory. At present, an exact etiology is unclear, though we have to entertain the possibility of a prodromal neurodegenerative process but nothing has been substantiated of yet. It is possible that the surgery and subsequent complications could be playing a role (or at least contributing) but further follow-up and monitoring is recommended  REFERRING DIAGNOSIS:  Memory changes   FINAL DIAGNOSIS (ICD-10 considerations):  Mild Neurocognitive Disorder (i.e., mild cognitive impairment), amnestic (etiology unclear)"     06/28/2021: She has major low back surgery in 2022 and she was under  anesthesia for a long time. She feels her memory is impaired since, she can't remember names, she can't remember if she took her pills. She hs alzheimer's and strokes in her family. She is perfoeming hr own IADLs and ADLs. Her husband has to check your bills. She drives fine. No accidents in the home, people are noticing hr husband. She is having word-finding difficulty.    MRI brain 06/2021: IMPRESSION: personally reviewed and agree with findings (addition 10 minutes to appointment)   MRI brain (with and without) demonstrating: - Stable, mild periventricular and subcortical foci of nonspecific T2 hyperintensities.  No abnormal lesions are seen on post contrast views.   - No acute findings.   Labs 07/07/2021: B12 folate b1, mma, homocysteine, tsh unremarkable   Patient complains of symptoms per HPI as well as the following symptoms: MCI . Pertinent negatives and positives per HPI. All others negative     HPI:  SACHEEN KRUMWIEDE is a 82 y.o. female here as requested by [employer], *  for head pain. She woke up one morning with pain in the right scalp. Impulses. They got severe that evening, she couldn't sleep, she tried CBD and melatonin, shooting pain behind the ear, not burning, she took Gabapentin (she had some for her low back pain and sciatica). She has seen a neurosurgeon and he was worried about her neck. She has a lot of neck pain. The pain is gone. The symptoms are better and she has not had it for a few days. Her low back is better. But she has a lot of neck problems, she has seen a neurosurgeon in the past. She exercises at least 5 morning a week, she exercises her neck. She has tightness in her neck. She has very tight neck muscles.    Reviewed notes, labs and imaging from outside physicians, which showed:   Personally reviewed images of MRI of the brain and MRI trigeminal protocol, essentially normal for age, some very minimal white matter changes, no vascular compression.   Bmp,cbc  unremarkable   Reviewed notes from emergency room visit where she was seen for right temporal sharp pain which does not radiate she reported 7 out of 10 pain which was sudden and lasting for 2 days waxing and waning no worsening to bright light coughing or straining, no other associated symptoms, examination and neurologic examination were unremarkable and nonfocal.  She did not have tenderness in the area, her pains could not be provoked, there were no rashes or focal deficits on exam, normal gait, they were concerned for trigeminal neuralgia.  She was given gabapentin 3 times daily.        REVIEW OF SYSTEMS: Out of a complete 14 system review of symptoms, the patient complains only of the following symptoms, and all other reviewed systems are negative.  ALLERGIES: Allergies  Allergen Reactions   Banana Swelling    Swelling around mouth and eyes and red blotches   Latex Swelling, Rash and Other (See Comments)   Ciprofloxacin Hcl Other (See Comments)    Severe knee inflammation    Gatifloxacin Other (See Comments)    Severe knee inflammation    Levofloxacin Other (See Comments)    Severe knee inflammation    Moxifloxacin Other (See Comments)    Severe knee inflammation    Norfloxacin Other (See Comments)    Severe knee inflammation    Nsaids Other (See Comments)    Upsets ulcers   Ofloxacin Other (See Comments)    Severe knee inflammation   Penicillins Hives    Tolerates (KEFLEX-cephalexin)    Bactrim [Sulfamethoxazole-Trimethoprim] Swelling    FAce and upper lip swollen    Banana Extract Allergy Skin Test     Other Reaction(s): swelling around mouth and eyes, red blotches   Donepezil     ADLARITY 5mg  PATCH  allergic skin reaction   Pregabalin Other (See Comments)   Methylisothiazolinone Rash and Other (See Comments)    A preservative found in a cream pt used   Tape Rash, Swelling and Other (See Comments)    HOME MEDICATIONS: Outpatient Medications Prior to Visit   Medication Sig Dispense Refill   acetaminophen (TYLENOL) 650 MG CR tablet Take 1 tablet (650 mg total) by mouth every 8 (eight) hours as needed for pain.     aspirin EC 81 MG tablet Take 81 mg by mouth 3 (three) times daily.     atorvastatin (LIPITOR) 10 MG tablet TAKE ONE (1) TABLET BY MOUTH EACH DAY 90 tablet 1   CALCIUM  PO Take 1,200 mg by mouth daily.      conjugated estrogens (PREMARIN) vaginal cream Place 1 applicator vaginally once a week.     DULoxetine (CYMBALTA) 30 MG capsule TAKE ONE (1) CAPSULE EACH DAY BY MOUTH 30 capsule 5   methenamine (HIPREX) 1 g tablet Take 1 g by mouth 2 (two) times daily with a meal. For UTI Prevention. Last UTI: October 20th, November 5th, and December 28th.     Multiple Vitamin (MULTIVITAMIN WITH MINERALS) TABS tablet Take 1 tablet by mouth daily. One A Day for Women     Polyethyl Glycol-Propyl Glycol (SYSTANE) 0.4-0.3 % SOLN Place 1 drop into both eyes as needed (dry/irritated eyes).     psyllium (METAMUCIL) 58.6 % powder Take 1 packet by mouth 3 (three) times daily.     valACYclovir (VALTREX) 500 MG tablet Take 1 tablet (500 mg total) by mouth daily as needed. 90 tablet 0   No facility-administered medications prior to visit.    PAST MEDICAL HISTORY: Past Medical History:  Diagnosis Date   Barrett's esophagus    Colitis    DDD (degenerative disc disease)    Factor 5 Leiden mutation, heterozygous [facility])    Per [facility] New Patient Packet.   Frequent UTI    H/O echocardiogram 04/01/2019   Per [facility] New Patient Packet.   Hiatal hernia    Per [facility] New Patient Packet.   High grade dysplasia of Barrett's epithelium    Per [facility] New Patient Packet.   History of bladder infections    Per [facility] New Patient Packet.   History of Papanicolaou smear of cervix 10/16/2011   Per [facility] New Patient Packet   Hypoglycemia    Hypotension    Per [facility] New Patient Packet.   IBS (irritable bowel syndrome)    Per [facility] New Patient Packet.   Lactose intolerance     Osteoarthritis    Per [facility] New Patient Packet.   Osteopenia    Per [facility] New Patient Packet.   Osteoporosis    Premature ventricular complex    Raynaud's disease    Per [facility] New Patient Packet.   Scoliosis    Per [facility] New Patient Packet.    PAST SURGICAL HISTORY: Past Surgical History:  Procedure Laterality Date   ABDOMINAL EXPOSURE N/A 08/12/2020   Procedure: ABDOMINAL EXPOSURE;  Surgeon: Nada Libman, MD;  Location: [facility] OR;  Service: Vascular;  Laterality: N/A;   ABDOMINAL HYSTERECTOMY  06/13/2011   By Dr.Scherer at [facility]. Per [facility] New Patient Packet.   ANTERIOR LAT LUMBAR FUSION Right 08/12/2020   Procedure: Right Lumbar two-three, Lumbar three-four, Lumbar four-five Anterolateral lumbar interbody fusion;  Surgeon: Maeola Harman, MD;  Location: [facility] OR;  Service: Neurosurgery;  Laterality: Right;   ANTERIOR LUMBAR FUSION N/A 08/12/2020   Procedure: Lumbar five Sacral one Anterior lumbar interbody fusion;  Surgeon: Maeola Harman, MD;  Location: [facility] OR;  Service: Neurosurgery;  Laterality: N/A;   BREAST EXCISIONAL BIOPSY Left 30+ yrs ago   COLONOSCOPY  11/23/2011   By Dr.McCune at [facility]. Per [facility] New Patient Packet.   COLONOSCOPY  08/26/2019   LUMBAR PERCUTANEOUS PEDICLE SCREW 4 LEVEL N/A 08/12/2020   Procedure: Percutaneous pedicle screw fixation from Lumbar two to Sacral one ;  Surgeon: Maeola Harman, MD;  Location: [facility] OR;  Service: Neurosurgery;  Laterality: N/A;   MAMMOGRAM  04/14/2019   pap smear  10/16/2011   SIGMOIDOSCOPY  11/02/2014   Per [facility] New Patient Packet  TONSILLECTOMY  06/12/1948   Per [facility] New Patient Packet.    FAMILY HISTORY: Family History  Problem Relation Age of Onset   Cancer Mother    Dementia Mother    Alzheimer's disease Mother    Cancer Father    Alzheimer's disease Maternal Grandmother    Dementia Maternal Grandmother    Alzheimer's disease Maternal Grandfather    Neuropathy Neg Hx     SOCIAL HISTORY: Social History    Socioeconomic History   Marital status: Married    Spouse name: Not on file   Number of children: 2   Years of education: Not on file   Highest education level: Associate degree: occupational, Scientist, product/process development, or vocational program  Occupational History   Occupation: Retired   Tobacco Use   Smoking status: Former    Types: Cigarettes    Start date: 06/12/1977   Smokeless tobacco: Never   Tobacco comments:    42 years ago. Smoked from age [age]  Vaping Use   Vaping status: Never Used  Substance and Sexual Activity   Alcohol use: Not Currently    Comment: occ   Drug use: No   Sexual activity: Not on file  Other Topics Concern   Not on file  Social History Narrative   Tobacco use, amount per day now: No   Past tobacco use, amount per day: Smoked ages [age]   How many years did you use tobacco: 17-20 years   Alcohol use (drinks per week): 1 glass of Red Wine per day.   Diet: Good   Do you drink/eat things with caffeine: No   Marital status: Married                                  What year were you married? 1978   Do you live in a house, apartment, assisted living, condo, trailer, etc.? Apartment/Independent Living   Is it one or more stories? 1   How many persons live in your home? 2    Do you have pets in your home?( please list) No   Highest Level of Education completed? 2 years of college.    Current or past profession: Retired Network engineer   Do you exercise?  Yes                                Type and how often? workout 5 days week. Walking 3-4 Days a Week.    Do you have a living will? Yes   Do you have a DNR form?  Yes                                 If not, do you want to discuss one?   Do you have signed POA/HPOA forms? Yes                       If so, please bring to you appointment      Do you have difficulty bathing or dressing yourself? No   Do you have difficulty preparing food or eating? No   Do you have difficulty managing your medications? No   Do  you have difficulty managing your finances? No   Do you have difficulty affording your medications? No  Per [facility] New Patient Packet. Abstracted by Jasmine/RMA.    Social Determinants of Health   Financial Resource Strain: Low Risk  (10/26/2022)   Overall Financial Resource Strain (CARDIA)    Difficulty of Paying Living Expenses: Not hard at all  Food Insecurity: No Food Insecurity (10/26/2022)   Hunger Vital Sign    Worried About Running Out of Food in the Last Year: Never true    Ran Out of Food in the Last Year: Never true  Transportation Needs: No Transportation Needs (10/26/2022)   PRAPARE - Administrator, Civil Service (Medical): No    Lack of Transportation (Non-Medical): No  Physical Activity: Sufficiently Active (10/26/2022)   Exercise Vital Sign    Days of Exercise per Week: 3 days    Minutes of Exercise per Session: 50 min  Stress: Stress Concern Present (10/26/2022)   [employer] of Occupational Health - Occupational Stress Questionnaire    Feeling of Stress : To some extent  Social Connections: Socially Integrated (10/26/2022)   Social Connection and Isolation Panel [NHANES]    Frequency of Communication with Friends and Family: Three times a week    Frequency of Social Gatherings with Friends and Family: Twice a week    Attends Religious Services: More than 4 times per year    Active Member of [employer] or Organizations: Yes    Attends Banker Meetings: Never    Marital Status: Married  Catering manager Violence: Not on file      PHYSICAL EXAM  Vitals:   02/14/23 0847  BP: 127/69  Pulse: 77  Weight: 138 lb (62.6 kg)  Height: 5\' 1"  (1.549 m)   Body mass index is 26.07 kg/m.     02/14/2023    8:50 AM 09/05/2022    2:17 PM 05/15/2022   11:05 AM  MMSE - Mini Mental State Exam  Orientation to time 5 4 5   Orientation to Place 5 5 5   Registration 3 3 3   Attention/ Calculation 5 3 2   Recall 3 1 3   Language- name 2 objects 2 2 2    Language- repeat 1 1 1   Language- follow 3 step command 3 3 3   Language- read & follow direction 1 1 1   Write a sentence 1 1 1   Copy design 1 1 1   Total score 30 25 27      Generalized: Well developed, in no acute distress   Neurological examination  Mentation: Alert oriented to time, place, history taking. Follows all commands speech and language fluent Cranial nerve II-XII: Pupils were equal round reactive to light. Extraocular movements were full, visual field were full on confrontational test. Facial sensation and strength were normal. Uvula tongue midline. Head turning and shoulder shrug  were normal and symmetric. Motor: The motor testing reveals 5 over 5 strength of all 4 extremities. Good symmetric motor tone is noted throughout.  Sensory: Sensory testing is intact to soft touch on all 4 extremities. No evidence of extinction is noted.  Coordination: Cerebellar testing reveals good finger-nose-finger and heel-to-shin bilaterally.  Gait and station: Gait is normal.  Reflexes: Deep tendon reflexes are symmetric and normal bilaterally.   DIAGNOSTIC DATA (LABS, IMAGING, TESTING) - I reviewed patient records, labs, notes, testing and imaging myself where available.  Lab Results  Component Value Date   WBC 6.0 01/30/2023   HGB 12.2 01/30/2023   HCT 38.7 01/30/2023   MCV 102.4 (H) 01/30/2023   PLT 298 01/30/2023      Component  Value Date/Time   NA 136 01/30/2023 1936   NA 141 05/15/2022 1231   K 3.4 (L) 01/30/2023 1936   CL 100 01/30/2023 1936   CO2 24 01/30/2023 1936   GLUCOSE 101 (H) 01/30/2023 1936   BUN 10 01/30/2023 1936   BUN 14 05/15/2022 1231   CREATININE 0.83 01/30/2023 1936   CREATININE 0.97 05/22/2022 1336   CALCIUM 9.2 01/30/2023 1936   PROT 6.9 01/30/2023 1936   PROT 6.3 10/18/2022 0941   ALBUMIN 4.1 01/30/2023 1936   ALBUMIN 4.8 05/15/2022 1231   AST 30 01/30/2023 1936   ALT 19 01/30/2023 1936   ALKPHOS 65 01/30/2023 1936   BILITOT 0.4 01/30/2023  1936   BILITOT 0.3 05/15/2022 1231   GFRNONAA >60 01/30/2023 1936   GFRNONAA 85 09/20/2020 0815   GFRAA 99 09/20/2020 0815   Lab Results  Component Value Date   CHOL 234 (H) 10/18/2021   HDL 82 10/18/2021   LDLCALC 124 (H) 10/18/2021   TRIG 163 (H) 10/18/2021   CHOLHDL 2.9 10/18/2021   Lab Results  Component Value Date   HGBA1C 5.8 (H) 10/18/2022   Lab Results  Component Value Date   VITAMINB12 488 10/18/2022   Lab Results  Component Value Date   TSH 3.220 05/15/2022      ASSESSMENT AND PLAN 82 y.o. year old female  has a past medical history of Barrett's esophagus, Colitis, DDD (degenerative disc disease), Factor 5 Leiden mutation, heterozygous (HCC), Frequent UTI, H/O echocardiogram (04/01/2019), Hiatal hernia, High grade dysplasia of Barrett's epithelium, History of bladder infections, History of Papanicolaou smear of cervix (10/16/2011), Hypoglycemia, Hypotension, IBS (irritable bowel syndrome), Lactose intolerance, Osteoarthritis, Osteopenia, Osteoporosis, Premature ventricular complex, Raynaud's disease, and Scoliosis. here with:  1.  Alzheimer's disease 2.  Transient alteration of awareness  - MMSE 30/30-improved. Next visit do MOCa -Discussed patient's brief episode of confusion with Dr. Lucia Gaskins.  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 -Okay to resume driving if the patient feels comfortable. -Continue with Leqembi infusions -Follow-up in 6 months or sooner if needed      Butch Penny, MSN, NP-C 02/13/2023, 4:16 PM [facility] [address] [place], [place] [address] [phone]

## 2023-02-13 NOTE — Progress Notes (Signed)
    Procedures performed today:    None.  Independent interpretation of notes and tests performed by another provider:   None.  Brief History, Exam, Impression, and Recommendations:    Metatarsalgia, left foot Carol Perez has developed some pain plantar aspect left foot on the second and third metatarsal heads. She has on some low-profile, thin slip on shoes today. I did suggest switching to athletic shoes, avoidance of barefoot walking and letting me know if things were not better in about 2 weeks.  Primary osteoarthritis of left knee Resolved with conservative treatment including formal physical therapy, arthritis Tylenol, return to see me as needed.    ____________________________________________ Ihor Austin. Benjamin Stain, M.D., ABFM., CAQSM., AME. Primary Care and Sports Medicine [facility]  Adjunct Professor of Family Medicine  [employer]  Restaurant manager, fast food

## 2023-02-13 NOTE — Assessment & Plan Note (Signed)
Carol Perez has developed some pain plantar aspect left foot on the second and third metatarsal heads. She has on some low-profile, thin slip on shoes today. I did suggest switching to athletic shoes, avoidance of barefoot walking and letting me know if things were not better in about 2 weeks.

## 2023-02-14 ENCOUNTER — Ambulatory Visit: Payer: Medicare Other | Admitting: Adult Health

## 2023-02-14 ENCOUNTER — Encounter: Payer: Self-pay | Admitting: Adult Health

## 2023-02-14 VITALS — BP 127/69 | HR 77 | Ht 61.0 in | Wt 138.0 lb

## 2023-02-14 DIAGNOSIS — G309 Alzheimer's disease, unspecified: Secondary | ICD-10-CM | POA: Diagnosis not present

## 2023-02-14 DIAGNOSIS — F0280 Dementia in other diseases classified elsewhere without behavioral disturbance: Secondary | ICD-10-CM

## 2023-02-20 ENCOUNTER — Other Ambulatory Visit: Payer: Self-pay | Admitting: *Deleted

## 2023-02-20 ENCOUNTER — Other Ambulatory Visit: Payer: Self-pay

## 2023-02-20 DIAGNOSIS — R42 Dizziness and giddiness: Secondary | ICD-10-CM

## 2023-02-20 DIAGNOSIS — Z79899 Other long term (current) drug therapy: Secondary | ICD-10-CM

## 2023-02-20 DIAGNOSIS — G3184 Mild cognitive impairment, so stated: Secondary | ICD-10-CM

## 2023-02-20 DIAGNOSIS — Z006 Encounter for examination for normal comparison and control in clinical research program: Secondary | ICD-10-CM | POA: Diagnosis not present

## 2023-02-21 LAB — URINALYSIS, ROUTINE W REFLEX MICROSCOPIC
Bilirubin, UA: NEGATIVE
Glucose, UA: NEGATIVE
Ketones, UA: NEGATIVE
Leukocytes,UA: NEGATIVE
Nitrite, UA: NEGATIVE
Protein,UA: NEGATIVE
RBC, UA: NEGATIVE
Specific Gravity, UA: 1.015 (ref 1.005–1.030)
Urobilinogen, Ur: 0.2 mg/dL (ref 0.2–1.0)
pH, UA: 5.5 (ref 5.0–7.5)

## 2023-02-21 LAB — CBC WITH DIFFERENTIAL/PLATELET
Basophils Absolute: 0.1 10*3/uL (ref 0.0–0.2)
Basos: 2 %
EOS (ABSOLUTE): 0.1 10*3/uL (ref 0.0–0.4)
Eos: 1 %
Hematocrit: 37 % (ref 34.0–46.6)
Hemoglobin: 11.8 g/dL (ref 11.1–15.9)
Immature Grans (Abs): 0 10*3/uL (ref 0.0–0.1)
Immature Granulocytes: 0 %
Lymphocytes Absolute: 1.8 10*3/uL (ref 0.7–3.1)
Lymphs: 29 %
MCH: 32.6 pg (ref 26.6–33.0)
MCHC: 31.9 g/dL (ref 31.5–35.7)
MCV: 102 fL — ABNORMAL HIGH (ref 79–97)
Monocytes Absolute: 0.8 10*3/uL (ref 0.1–0.9)
Monocytes: 13 %
Neutrophils Absolute: 3.4 10*3/uL (ref 1.4–7.0)
Neutrophils: 55 %
Platelets: 265 10*3/uL (ref 150–450)
RBC: 3.62 x10E6/uL — ABNORMAL LOW (ref 3.77–5.28)
RDW: 11.6 % — ABNORMAL LOW (ref 11.7–15.4)
WBC: 6.2 10*3/uL (ref 3.4–10.8)

## 2023-02-21 LAB — COMPREHENSIVE METABOLIC PANEL
ALT: 13 IU/L (ref 0–32)
AST: 25 IU/L (ref 0–40)
Albumin: 4.2 g/dL (ref 3.8–4.8)
Alkaline Phosphatase: 73 IU/L (ref 44–121)
BUN/Creatinine Ratio: 11 — ABNORMAL LOW (ref 12–28)
BUN: 11 mg/dL (ref 8–27)
Bilirubin Total: <0.2 mg/dL (ref 0.0–1.2)
CO2: 25 mmol/L (ref 20–29)
Calcium: 9.1 mg/dL (ref 8.7–10.3)
Chloride: 104 mmol/L (ref 96–106)
Creatinine, Ser: 0.97 mg/dL (ref 0.57–1.00)
Globulin, Total: 2 g/dL (ref 1.5–4.5)
Glucose: 79 mg/dL (ref 70–99)
Potassium: 4.4 mmol/L (ref 3.5–5.2)
Sodium: 143 mmol/L (ref 134–144)
Total Protein: 6.2 g/dL (ref 6.0–8.5)
eGFR: 59 mL/min/{1.73_m2} — ABNORMAL LOW (ref >59)

## 2023-02-23 LAB — URINE CULTURE

## 2023-03-06 ENCOUNTER — Encounter: Payer: Medicare Other | Admitting: Family

## 2023-03-06 DIAGNOSIS — Z006 Encounter for examination for normal comparison and control in clinical research program: Secondary | ICD-10-CM | POA: Diagnosis not present

## 2023-03-06 DIAGNOSIS — G3184 Mild cognitive impairment, so stated: Secondary | ICD-10-CM | POA: Diagnosis not present

## 2023-03-20 ENCOUNTER — Other Ambulatory Visit: Payer: Self-pay | Admitting: *Deleted

## 2023-03-20 DIAGNOSIS — G3184 Mild cognitive impairment, so stated: Secondary | ICD-10-CM

## 2023-03-20 DIAGNOSIS — Z79899 Other long term (current) drug therapy: Secondary | ICD-10-CM

## 2023-03-20 DIAGNOSIS — Z006 Encounter for examination for normal comparison and control in clinical research program: Secondary | ICD-10-CM | POA: Diagnosis not present

## 2023-03-20 NOTE — Progress Notes (Signed)
MRI ordered for surveillance while patient on Leqembi. MRI must occur between 04/04/23 -04/13/23 per Leqembi protocol.

## 2023-04-03 ENCOUNTER — Encounter: Payer: Self-pay | Admitting: Family Medicine

## 2023-04-03 ENCOUNTER — Ambulatory Visit (INDEPENDENT_AMBULATORY_CARE_PROVIDER_SITE_OTHER): Payer: Medicare Other | Admitting: Family Medicine

## 2023-04-03 VITALS — BP 135/49 | HR 85 | Ht 61.0 in | Wt 136.0 lb

## 2023-04-03 DIAGNOSIS — F0280 Dementia in other diseases classified elsewhere without behavioral disturbance: Secondary | ICD-10-CM

## 2023-04-03 DIAGNOSIS — G3184 Mild cognitive impairment, so stated: Secondary | ICD-10-CM | POA: Diagnosis not present

## 2023-04-03 DIAGNOSIS — N390 Urinary tract infection, site not specified: Secondary | ICD-10-CM | POA: Diagnosis not present

## 2023-04-03 DIAGNOSIS — F339 Major depressive disorder, recurrent, unspecified: Secondary | ICD-10-CM

## 2023-04-03 DIAGNOSIS — G309 Alzheimer's disease, unspecified: Secondary | ICD-10-CM

## 2023-04-03 DIAGNOSIS — Z006 Encounter for examination for normal comparison and control in clinical research program: Secondary | ICD-10-CM | POA: Diagnosis not present

## 2023-04-03 DIAGNOSIS — R7301 Impaired fasting glucose: Secondary | ICD-10-CM

## 2023-04-03 LAB — POCT URINALYSIS DIP (CLINITEK)
Bilirubin, UA: NEGATIVE
Blood, UA: NEGATIVE
Glucose, UA: NEGATIVE mg/dL
Ketones, POC UA: NEGATIVE mg/dL
Leukocytes, UA: NEGATIVE
Nitrite, UA: NEGATIVE
POC PROTEIN,UA: NEGATIVE
Spec Grav, UA: 1.02 (ref 1.010–1.025)
Urobilinogen, UA: 0.2 U/dL
pH, UA: 6 (ref 5.0–8.0)

## 2023-04-03 LAB — POCT GLYCOSYLATED HEMOGLOBIN (HGB A1C): Hemoglobin A1C: 5.5 % (ref 4.0–5.6)

## 2023-04-03 NOTE — Progress Notes (Unsigned)
Established Patient Office Visit  Subjective   Patient ID: Carol Perez, female    DOB: 12/11/1940  Age: 82 y.o. MRN: [id number]  Chief Complaint  Patient presents with   ifg    HPI  Impaired fasting glucose-no increased thirst or urination. No symptoms consistent with hypoglycemia.  She still doing well with her treatments for mild cognitive impairment with good for neurology.  She says she really has not had a lot of side effects with the medication and she is done well.  She is another treatment today.  Has noticed a tremor in the mornings that started after she started her treatments.  But says it is not too bothersome.  She is also complaining of some urinary symptoms for about a week she has had a little bit of burning sensation and little bit of urinary frequency and some slight dribbling.  No fevers chills or sweats.  She did do a home test and it came back positive so she has been taking some Azo.  She feels like her anxiety is doing okay right now.  She and her husband will be moving to an assisted living facility in [place] they currently live in [place].  She knows that will be a little bit stressful as well.   {History (Optional):23778}  ROS    Objective:     BP (!) 135/49   Pulse 85   Ht 5\' 1"  (1.549 m)   Wt 136 lb (61.7 kg)   SpO2 100%   BMI 25.70 kg/m  {Vitals History (Optional):23777}  Physical Exam Vitals and nursing note reviewed.  Constitutional:      Appearance: Normal appearance.  HENT:     Head: Normocephalic and atraumatic.  Eyes:     Conjunctiva/sclera: Conjunctivae normal.  Cardiovascular:     Rate and Rhythm: Normal rate and regular rhythm.  Pulmonary:     Effort: Pulmonary effort is normal.     Breath sounds: Normal breath sounds.  Skin:    General: Skin is warm and dry.  Neurological:     Mental Status: She is alert.  Psychiatric:        Mood and Affect: Mood normal.      Results for orders placed or performed in visit on  04/03/23  POCT HgB A1C  Result Value Ref Range   Hemoglobin A1C 5.5 4.0 - 5.6 %   HbA1c POC (<> result, manual entry)     HbA1c, POC (prediabetic range)     HbA1c, POC (controlled diabetic range)    POCT URINALYSIS DIP (CLINITEK)  Result Value Ref Range   Color, UA yellow yellow   Clarity, UA clear clear   Glucose, UA negative negative mg/dL   Bilirubin, UA negative negative   Ketones, POC UA negative negative mg/dL   Spec Grav, UA 4.132 4.401 - 1.025   Blood, UA negative negative   pH, UA 6.0 5.0 - 8.0   POC PROTEIN,UA negative negative, trace   Urobilinogen, UA 0.2 0.2 or 1.0 E.U./dL   Nitrite, UA Negative Negative   Leukocytes, UA Negative Negative    {Labs (Optional):23779}  The ASCVD Risk score (Arnett DK, et al., 2019) failed to calculate for the following reasons:   The 2019 ASCVD risk score is only valid for ages 41 to 22    Assessment & Plan:   Problem List Items Addressed This Visit       Endocrine   IFG (impaired fasting glucose) - Primary    1C  looks phenomenal today at 5.0.  Plan to recheck again in 6 months.      Relevant Orders   POCT HgB A1C (Completed)     Genitourinary   Recurrent UTI    Urinalysis actually looks negative today so we will send culture for confirmation.  Encouraged her to continue to use her Premarin cream 2-3 times a week      Relevant Orders   POCT URINALYSIS DIP (CLINITEK) (Completed)    Return in about 4 months (around 08/04/2023) for Pre-diabetes.    Nani Gasser, MD

## 2023-04-03 NOTE — Assessment & Plan Note (Addendum)
Urinalysis actually looks negative today so we will send culture for confirmation.  Encouraged her to continue to use her Premarin cream 2-3 times a week

## 2023-04-03 NOTE — Assessment & Plan Note (Signed)
1C looks phenomenal today at 5.0.  Plan to recheck again in 6 months.

## 2023-04-04 NOTE — Assessment & Plan Note (Addendum)
Slight tremor. Doing really well with infusions.

## 2023-04-04 NOTE — Assessment & Plan Note (Signed)
Stable. No changes to cymbalta today.

## 2023-04-09 DIAGNOSIS — R29898 Other symptoms and signs involving the musculoskeletal system: Secondary | ICD-10-CM | POA: Diagnosis not present

## 2023-04-09 DIAGNOSIS — R9431 Abnormal electrocardiogram [ECG] [EKG]: Secondary | ICD-10-CM | POA: Diagnosis not present

## 2023-04-09 DIAGNOSIS — R410 Disorientation, unspecified: Secondary | ICD-10-CM | POA: Diagnosis not present

## 2023-04-09 DIAGNOSIS — I6782 Cerebral ischemia: Secondary | ICD-10-CM | POA: Diagnosis not present

## 2023-04-09 DIAGNOSIS — Z79899 Other long term (current) drug therapy: Secondary | ICD-10-CM | POA: Diagnosis not present

## 2023-04-09 DIAGNOSIS — G4489 Other headache syndrome: Secondary | ICD-10-CM | POA: Diagnosis not present

## 2023-04-09 DIAGNOSIS — G319 Degenerative disease of nervous system, unspecified: Secondary | ICD-10-CM | POA: Diagnosis not present

## 2023-04-09 DIAGNOSIS — R9089 Other abnormal findings on diagnostic imaging of central nervous system: Secondary | ICD-10-CM | POA: Diagnosis not present

## 2023-04-09 DIAGNOSIS — Z881 Allergy status to other antibiotic agents status: Secondary | ICD-10-CM | POA: Diagnosis not present

## 2023-04-09 DIAGNOSIS — I6523 Occlusion and stenosis of bilateral carotid arteries: Secondary | ICD-10-CM | POA: Diagnosis not present

## 2023-04-09 DIAGNOSIS — R29818 Other symptoms and signs involving the nervous system: Secondary | ICD-10-CM | POA: Diagnosis not present

## 2023-04-09 DIAGNOSIS — G309 Alzheimer's disease, unspecified: Secondary | ICD-10-CM | POA: Diagnosis not present

## 2023-04-09 DIAGNOSIS — R6889 Other general symptoms and signs: Secondary | ICD-10-CM | POA: Diagnosis not present

## 2023-04-09 DIAGNOSIS — Z743 Need for continuous supervision: Secondary | ICD-10-CM | POA: Diagnosis not present

## 2023-04-09 DIAGNOSIS — Z886 Allergy status to analgesic agent status: Secondary | ICD-10-CM | POA: Diagnosis not present

## 2023-04-09 DIAGNOSIS — Z880 Allergy status to penicillin: Secondary | ICD-10-CM | POA: Diagnosis not present

## 2023-04-09 DIAGNOSIS — R531 Weakness: Secondary | ICD-10-CM | POA: Diagnosis not present

## 2023-04-10 ENCOUNTER — Telehealth: Payer: Self-pay | Admitting: Adult Health

## 2023-04-10 NOTE — Telephone Encounter (Signed)
I called and LMVM for pt/ husband that she does not need MRI that is scheduled tomorrow since she had one 10-28/2024.  If they can call to cancel.  I may ask our MRI coor to do as well.

## 2023-04-10 NOTE — Telephone Encounter (Signed)
Yesterday morning patient became disoriented and lost her memory (did not know where she was) . All came back after 45 minutes and by this time she was on her way to [facility] in the Ambulance. Was instructed to schedule a f/u appt with neurologist. Would like a call from nurse to discuss a work in appointment. She needs to be seen as soon possible with Dr. Lucia Gaskins.

## 2023-04-10 NOTE — Telephone Encounter (Signed)
Pt is wanting to cancel the MRI for tomorrow

## 2023-04-10 NOTE — Telephone Encounter (Signed)
MRI has been cancelled.

## 2023-04-10 NOTE — Telephone Encounter (Signed)
MRI is cancelled

## 2023-04-10 NOTE — Telephone Encounter (Signed)
I called pt, husband on phone.  Seen in [facility] for 45 min episode of Disorientation/memory, work up negative.  Similar to previous episode.  Had MRI brain wo.  Needs f/u appt with Dr. Lucia Gaskins.  Seen last by [facility] NP 02-14-2023. Amyloid spell. Has question if needs MRI scheduled tomorrow for lequembi follow up.  (Scheduled at [facility] mobile at 1400).    Please advise.   Carol Friend, DO - 04/09/2023 Formatting of this note might be different from the original. CLINICAL DATA:  Provided history: Neuro deficit, acute, stroke suspected. Altered mental status.  EXAM: MRI HEAD WITHOUT CONTRAST  TECHNIQUE: Multiplanar, multiecho pulse sequences of the brain and surrounding structures were obtained without intravenous contrast.  COMPARISON:  Non-contrast head CT, CT angiogram head/neck and CT perfusion head 04/09/2023.  FINDINGS: Brain:  Mild generalized cerebral volume loss.  Multifocal T2 FLAIR hyperintense signal abnormality within the cerebral white matter, nonspecific but compatible with mild chronic small vessel ischemic disease.  There is no acute infarct.  No evidence of an intracranial mass.  No chronic intracranial blood products.  No extra-axial fluid collection.  No midline shift.  Vascular: Maintained flow voids within the proximal large arterial vessels.  Skull and upper cervical spine: No focal worrisome marrow lesion.  Sinuses/Orbits: No mass or acute finding within the imaged orbits. No significant paranasal sinus disease.  IMPRESSION: 1. No evidence of an acute intracranial abnormality. 2. Mild chronic small vessel ischemic changes within the cerebral white matter. 3. Mild generalized cerebral volume loss.   Electronically Signed   By: Jackey Loge D.O.   On: 04/09/2023 14:56 Exam End: 04/09/23 12:35  Specimen Collected: 04/09/23 14:43 Last Resulted: 04/09/23 14:56 Received From: [facility]  Result Received: 04/10/23 13:30

## 2023-04-11 ENCOUNTER — Encounter (HOSPITAL_BASED_OUTPATIENT_CLINIC_OR_DEPARTMENT_OTHER): Payer: Self-pay

## 2023-04-11 ENCOUNTER — Ambulatory Visit (HOSPITAL_BASED_OUTPATIENT_CLINIC_OR_DEPARTMENT_OTHER): Payer: Medicare Other

## 2023-04-19 ENCOUNTER — Other Ambulatory Visit: Payer: Self-pay | Admitting: Neurology

## 2023-04-19 ENCOUNTER — Telehealth: Payer: Self-pay | Admitting: Neurology

## 2023-04-19 DIAGNOSIS — Z006 Encounter for examination for normal comparison and control in clinical research program: Secondary | ICD-10-CM | POA: Diagnosis not present

## 2023-04-19 DIAGNOSIS — G3184 Mild cognitive impairment, so stated: Secondary | ICD-10-CM | POA: Diagnosis not present

## 2023-04-19 NOTE — Telephone Encounter (Signed)
Pt is moving to [facility] and wants to know if she still needs to continue infusion, can we recommend a Neurologist in [place] so she can continue closer to home

## 2023-04-19 NOTE — Telephone Encounter (Signed)
Noted  

## 2023-04-19 NOTE — Telephone Encounter (Signed)
Spoke to patient, referred to [facility]

## 2023-04-24 ENCOUNTER — Telehealth: Payer: Self-pay | Admitting: Adult Health

## 2023-04-24 NOTE — Telephone Encounter (Signed)
Referral for neurology fax to [facility]. Phone: [phone],Fax: [phone]

## 2023-04-30 ENCOUNTER — Ambulatory Visit: Payer: Medicare Other | Admitting: Adult Health

## 2023-05-01 DIAGNOSIS — N3020 Other chronic cystitis without hematuria: Secondary | ICD-10-CM | POA: Diagnosis not present

## 2023-05-01 DIAGNOSIS — N3000 Acute cystitis without hematuria: Secondary | ICD-10-CM | POA: Diagnosis not present

## 2023-05-15 ENCOUNTER — Telehealth: Payer: Self-pay | Admitting: Adult Health

## 2023-05-15 NOTE — Telephone Encounter (Signed)
Pt states she just finished an infusion for Alzheimer's , the [facility] that Dr Lucia Gaskins referred her to can not see her for 7-8 months.  Pt would like to know if she can do the other infusion here Dr Lucia Gaskins told her about for Alzheimer's  as well, please call

## 2023-05-16 ENCOUNTER — Other Ambulatory Visit: Payer: Self-pay

## 2023-05-16 ENCOUNTER — Telehealth: Payer: Self-pay

## 2023-05-16 MED ORDER — VALACYCLOVIR HCL 500 MG PO TABS: 500.0000 mg | ORAL_TABLET | Freq: Every day | ORAL | 2 refills | Status: AC | PRN

## 2023-05-16 NOTE — Telephone Encounter (Signed)
Medication sent yesterday 

## 2023-05-16 NOTE — Telephone Encounter (Signed)
Copied from CRM [id number]. Topic: Clinical - Medication Refill >> May 15, 2023  4:28 PM [name] wrote: Most Recent Primary Care Visit:  Provider: Nani Gasser D  Department: [facility]  Visit Type: OFFICE VISIT  Date: 04/03/2023  Medication: valACYclovir (VALTREX) 500 MG tablet  Has the patient contacted their pharmacy? Yes -  (Agent: If no, request that the patient contact the pharmacy for the refill. If patient does not wish to contact the pharmacy document the reason why and proceed with request.) (Agent: If yes, when and what did the pharmacy advise?)  Is this the correct pharmacy for this prescription? No - [facility] [address] [place], [place], [place] [address] P: [phone] F: [phone] If no, delete pharmacy and type the correct one.  This is the patient's preferred pharmacy:  [facility] [place], [place] - [address] [place] [address] Phone: [phone] Fax: [phone]  [id number] - [place], [place] - [address] [facility] [address] [place] [address] Phone: [phone] Fax: [phone]   Has the prescription been filled recently? No  Is the patient out of the medication? Yes  Has the patient been seen for an appointment in the last year OR does the patient have an upcoming appointment? Yes  Can we respond through MyChart? No  Agent: Please be advised that Rx refills may take up to 3 business days. We ask that you follow-up with your pharmacy.

## 2023-05-16 NOTE — Telephone Encounter (Signed)
I checked with [facility] and they are not able to infuse patients who reside in AL facility (a medicare paying issue).

## 2023-05-17 NOTE — Telephone Encounter (Signed)
Contacted [facility] transferred to Geriatric attempted to reach out to the patient, but appt has not been schedule yet. Informed the receptionist will have pt call to schedule appt.  Ms. Haggan said she spoke with someone, but they could not schedule an appt until 8 months out. Pt asking if can come back to [facility] to start the new medication for infusion. Would like a call back.

## 2023-05-17 NOTE — Telephone Encounter (Signed)
Contacted [facility] transferred to Geriatric attempted to reach out to the patient, but appt has not been schedule yet. Informed the receptionist will have pt call to schedule appt.  Ms. Handelman said she spoke with someone, but they could not schedule an appt until 8 months out. Pt asking if can come back to [facility] to start the new medication for infusion. Would like a call back.    Message from [place] in phone room

## 2023-05-17 NOTE — Telephone Encounter (Signed)
See other note. [facility] cannot infuse patients who live in ALF.

## 2023-05-21 ENCOUNTER — Other Ambulatory Visit: Payer: Self-pay | Admitting: Family Medicine

## 2023-05-21 DIAGNOSIS — Z1231 Encounter for screening mammogram for malignant neoplasm of breast: Secondary | ICD-10-CM

## 2023-05-21 DIAGNOSIS — G309 Alzheimer's disease, unspecified: Secondary | ICD-10-CM | POA: Diagnosis not present

## 2023-05-21 DIAGNOSIS — R1031 Right lower quadrant pain: Secondary | ICD-10-CM | POA: Diagnosis not present

## 2023-05-21 DIAGNOSIS — K5900 Constipation, unspecified: Secondary | ICD-10-CM | POA: Diagnosis not present

## 2023-05-21 DIAGNOSIS — K22719 Barrett's esophagus with dysplasia, unspecified: Secondary | ICD-10-CM | POA: Diagnosis not present

## 2023-05-21 DIAGNOSIS — R194 Change in bowel habit: Secondary | ICD-10-CM | POA: Diagnosis not present

## 2023-05-21 DIAGNOSIS — K219 Gastro-esophageal reflux disease without esophagitis: Secondary | ICD-10-CM | POA: Diagnosis not present

## 2023-05-23 NOTE — Telephone Encounter (Signed)
I spoke with the patient.  She states she is currently waiting on an appointment.  She filled out the paperwork for [facility] and she was told the current wait is 8 months but once an appt is scheduled she'll be eligible for the wait list.  I let her know that I was told [facility] is unable to infuse patient who reside in AL facility. She verbalized understanding but did ask if there is anyway to contact [facility] and ask them to see her sooner.

## 2023-05-23 NOTE — Telephone Encounter (Signed)
If she makes an appointment she should get on the wait list. Many people cancel and she may get in much sooner. thanks

## 2023-05-24 NOTE — Telephone Encounter (Signed)
I called and LMVM for pt that she just needs to get appt and then be placed on waitlist, they do have cancellations so maybe could get in sooner.  And she may even call to see if any cancellations.  We do not call.  She may call back if needed.

## 2023-06-07 DIAGNOSIS — H524 Presbyopia: Secondary | ICD-10-CM | POA: Diagnosis not present

## 2023-06-07 DIAGNOSIS — H0101B Ulcerative blepharitis left eye, upper and lower eyelids: Secondary | ICD-10-CM | POA: Diagnosis not present

## 2023-06-07 DIAGNOSIS — H16223 Keratoconjunctivitis sicca, not specified as Sjogren's, bilateral: Secondary | ICD-10-CM | POA: Diagnosis not present

## 2023-06-07 DIAGNOSIS — E119 Type 2 diabetes mellitus without complications: Secondary | ICD-10-CM | POA: Diagnosis not present

## 2023-06-07 DIAGNOSIS — H26491 Other secondary cataract, right eye: Secondary | ICD-10-CM | POA: Diagnosis not present

## 2023-06-08 LAB — HM DIABETES EYE EXAM

## 2023-06-15 ENCOUNTER — Telehealth: Payer: Self-pay

## 2023-06-15 NOTE — Telephone Encounter (Signed)
 Copied from CRM [id number]. Topic: Clinical - Prescription Issue >> Jun 15, 2023  2:48 PM Joesph PARAS wrote: Reason for CRM: [facility] calling to state that all future CYMBALTA  and LIPITOR prescriptions be sent to them per patient request. They can be reached at [phone].

## 2023-06-18 ENCOUNTER — Other Ambulatory Visit: Payer: Self-pay | Admitting: *Deleted

## 2023-06-18 DIAGNOSIS — F321 Major depressive disorder, single episode, moderate: Secondary | ICD-10-CM

## 2023-06-18 DIAGNOSIS — E785 Hyperlipidemia, unspecified: Secondary | ICD-10-CM

## 2023-06-18 MED ORDER — ATORVASTATIN CALCIUM 10 MG PO TABS
ORAL_TABLET | ORAL | 1 refills | Status: DC
Start: 2023-06-18 — End: 2024-02-29

## 2023-06-18 MED ORDER — DULOXETINE HCL 30 MG PO CPEP
30.0000 mg | ORAL_CAPSULE | Freq: Every day | ORAL | 5 refills | Status: DC
Start: 2023-06-18 — End: 2024-02-19

## 2023-06-18 NOTE — Telephone Encounter (Signed)
 Pt's medication sent to requested pharmacy

## 2023-06-21 ENCOUNTER — Ambulatory Visit
Admission: RE | Admit: 2023-06-21 | Discharge: 2023-06-21 | Disposition: A | Discharge status: Home / Self Care | Payer: Medicare Other | Source: Ambulatory Visit | Attending: Family Medicine | Admitting: Family Medicine

## 2023-06-21 DIAGNOSIS — Z1231 Encounter for screening mammogram for malignant neoplasm of breast: Secondary | ICD-10-CM | POA: Diagnosis not present

## 2023-07-04 ENCOUNTER — Other Ambulatory Visit: Payer: Self-pay

## 2023-07-04 ENCOUNTER — Ambulatory Visit: Payer: Medicare Other

## 2023-07-04 ENCOUNTER — Telehealth: Payer: Self-pay

## 2023-07-04 DIAGNOSIS — M810 Age-related osteoporosis without current pathological fracture: Secondary | ICD-10-CM

## 2023-07-04 NOTE — Telephone Encounter (Signed)
Carol Perez has a Prolia injection next week. PA required for Prolia.

## 2023-07-04 NOTE — Telephone Encounter (Signed)
Prior auth for: Prolia Determination: Pending fax correspondence of form required for determination as of 07/04/23 Auth #: [id number] Valid from:n/a Insurance #: [phone] Insurance fax#:[phone]

## 2023-07-04 NOTE — Progress Notes (Signed)
Ordered labs for Prolia injection.  

## 2023-07-06 ENCOUNTER — Ambulatory Visit (INDEPENDENT_AMBULATORY_CARE_PROVIDER_SITE_OTHER): Payer: Medicare Other | Admitting: Medical-Surgical

## 2023-07-06 ENCOUNTER — Ambulatory Visit: Payer: Medicare Other

## 2023-07-06 VITALS — BP 137/76 | HR 94 | Temp 99.0°F | Ht 61.0 in | Wt 141.0 lb

## 2023-07-06 DIAGNOSIS — R0602 Shortness of breath: Secondary | ICD-10-CM | POA: Diagnosis not present

## 2023-07-06 DIAGNOSIS — J069 Acute upper respiratory infection, unspecified: Secondary | ICD-10-CM | POA: Diagnosis not present

## 2023-07-06 DIAGNOSIS — K449 Diaphragmatic hernia without obstruction or gangrene: Secondary | ICD-10-CM | POA: Diagnosis not present

## 2023-07-06 DIAGNOSIS — R0989 Other specified symptoms and signs involving the circulatory and respiratory systems: Secondary | ICD-10-CM | POA: Diagnosis not present

## 2023-07-06 DIAGNOSIS — R059 Cough, unspecified: Secondary | ICD-10-CM | POA: Diagnosis not present

## 2023-07-06 DIAGNOSIS — R140 Abdominal distension (gaseous): Secondary | ICD-10-CM | POA: Diagnosis not present

## 2023-07-06 DIAGNOSIS — Z20828 Contact with and (suspected) exposure to other viral communicable diseases: Secondary | ICD-10-CM

## 2023-07-06 LAB — POC COVID19 BINAXNOW: SARS Coronavirus 2 Ag: NEGATIVE

## 2023-07-06 LAB — POCT INFLUENZA A/B
Influenza A, POC: NEGATIVE
Influenza B, POC: NEGATIVE

## 2023-07-06 MED ORDER — BENZONATATE 100 MG PO CAPS: 100.0000 mg | ORAL_CAPSULE | Freq: Three times a day (TID) | ORAL | 0 refills | Status: AC | PRN

## 2023-07-06 MED ORDER — IPRATROPIUM BROMIDE 0.03 % NA SOLN: 2.0000 | Freq: Two times a day (BID) | NASAL | 0 refills | Status: AC

## 2023-07-06 MED ORDER — PROMETHAZINE-DM 6.25-15 MG/5ML PO SYRP: 5.0000 mL | ORAL_SOLUTION | Freq: Four times a day (QID) | ORAL | 0 refills | Status: DC | PRN

## 2023-07-06 NOTE — Progress Notes (Unsigned)
        Established patient visit  History, exam, impression, and plan:  1. Viral URI with cough (Primary) Pleasant 83 year old female presenting today with complaints of 2 days of sinus congestion with nonproductive cough, post tussive nausea, ear pressure, headache and generalized malaise. Feels like she can't take a deep breath.  Has been using Atrovent nasal spray which is helpful. Taking Ibuprofen 400mg  every 4 hours as needed. Lives in an assisted living facility with her husband who had the flu last week. POCT flu and covid negative today. Refilling Atrovent. Adding tessalon perls for daytime cough. Start promethazine-DM for night-time cough management. Getting chest x-ray today. Adding Tamiflu due to exposure and current symptoms.  - POCT Influenza A/B - POC COVID-19 - ipratropium (ATROVENT) 0.03 % nasal spray; Place 2 sprays into both nostrils every 12 (twelve) hours.  Dispense: 30 mL; Refill: 0 - benzonatate (TESSALON) 100 MG capsule; Take 1 capsule (100 mg total) by mouth 3 (three) times daily as needed for up to 7 days for cough.  Dispense: 21 capsule; Refill: 0 - promethazine-dextromethorphan (PROMETHAZINE-DM) 6.25-15 MG/5ML syrup; Take 5 mLs by mouth 4 (four) times daily as needed for cough.  Dispense: 118 mL; Refill: 0 - DG Chest 2 View; Future  Physical Exam Vitals reviewed.  Constitutional:      General: She is not in acute distress.    Appearance: Normal appearance. She is not ill-appearing.  HENT:     Head: Normocephalic and atraumatic.     Right Ear: Ear canal and external ear normal. There is no impacted cerumen.     Left Ear: Ear canal and external ear normal. There is no impacted cerumen.     Nose: Congestion and rhinorrhea present.     Mouth/Throat:     Mouth: Mucous membranes are moist.     Pharynx: Oropharyngeal exudate (clear) and posterior oropharyngeal erythema (mild) present.  Eyes:     General: No scleral icterus.       Right eye: No discharge.        Left  eye: No discharge.     Extraocular Movements: Extraocular movements intact.     Conjunctiva/sclera: Conjunctivae normal.     Pupils: Pupils are equal, round, and reactive to light.  Cardiovascular:     Rate and Rhythm: Normal rate and regular rhythm.     Pulses: Normal pulses.     Heart sounds: Normal heart sounds. No murmur heard.    No friction rub. No gallop.  Pulmonary:     Effort: Pulmonary effort is normal. No respiratory distress.     Breath sounds: Normal breath sounds. No wheezing.  Musculoskeletal:     Cervical back: Neck supple.  Lymphadenopathy:     Cervical: No cervical adenopathy.  Skin:    General: Skin is warm and dry.  Neurological:     Mental Status: She is alert and oriented to person, place, and time.  Psychiatric:        Mood and Affect: Mood normal.        Behavior: Behavior normal.        Thought Content: Thought content normal.        Judgment: Judgment normal.   Procedures performed this visit: None.  Return if symptoms worsen or fail to improve.  __________________________________ Thayer Ohm, DNP, APRN, FNP-BC Primary Care and Sports Medicine [facility] [place]

## 2023-07-07 ENCOUNTER — Encounter: Payer: Self-pay | Admitting: Medical-Surgical

## 2023-07-07 LAB — CMP14+EGFR
ALT: 19 [IU]/L (ref 0–32)
AST: 33 [IU]/L (ref 0–40)
Albumin: 4.4 g/dL (ref 3.8–4.8)
Alkaline Phosphatase: 94 [IU]/L (ref 44–121)
BUN/Creatinine Ratio: 11 — ABNORMAL LOW (ref 12–28)
BUN: 13 mg/dL (ref 8–27)
Bilirubin Total: 0.3 mg/dL (ref 0.0–1.2)
CO2: 22 mmol/L (ref 20–29)
Calcium: 9.9 mg/dL (ref 8.7–10.3)
Chloride: 100 mmol/L (ref 96–106)
Creatinine, Ser: 1.16 mg/dL — ABNORMAL HIGH (ref 0.57–1.00)
Globulin, Total: 2.4 g/dL (ref 1.5–4.5)
Glucose: 116 mg/dL — ABNORMAL HIGH (ref 70–99)
Potassium: 4.6 mmol/L (ref 3.5–5.2)
Sodium: 143 mmol/L (ref 134–144)
Total Protein: 6.8 g/dL (ref 6.0–8.5)
eGFR: 48 mL/min/{1.73_m2} — ABNORMAL LOW (ref >59)

## 2023-07-07 MED ORDER — OSELTAMIVIR PHOSPHATE 75 MG PO CAPS: 75.0000 mg | ORAL_CAPSULE | Freq: Two times a day (BID) | ORAL | 0 refills | Status: DC

## 2023-07-09 ENCOUNTER — Encounter: Payer: Self-pay | Admitting: Family Medicine

## 2023-07-09 NOTE — Progress Notes (Signed)
Hi Narcissa, kidney function is up just a little bit normal year between 0.9 and 1.0.  It was 1.1 this time I do want a keep an eye on that and plan to recheck again in about 2 weeks.  Liver function looks great.

## 2023-07-10 ENCOUNTER — Encounter: Payer: Self-pay | Admitting: Medical-Surgical

## 2023-07-10 ENCOUNTER — Telehealth: Payer: Self-pay

## 2023-07-10 NOTE — Telephone Encounter (Signed)
Patient informed  and scheduled for nurse visit for Prolia injection next week 07/19/23

## 2023-07-10 NOTE — Telephone Encounter (Signed)
In speaking with patient. States she has not heard from CXR that she had done on 07/06/2023 ( ordered by Christen Butter, NP) . She states her cough is pretty severe and coughing up yellow phlegm from chest.  She is taking cough medication and Tamiflu . She is worried that she could have pneumonia.  Should I go ahead and schedule the patient Prolia injection for this week or await the results of the CXR - I have called reading room and had x-ray moved up for reading .

## 2023-07-10 NOTE — Telephone Encounter (Signed)
Copied from CRM [id number]. Topic: Clinical - Medical Advice >> Jul 09, 2023 11:20 AM Hector Shade B wrote: Reason for CRM: Patient's spouse called in regards to having the prolia injection changed to another time and date please call patient at [phone]

## 2023-07-10 NOTE — Telephone Encounter (Signed)
Ok to schedule for end of next week for prolia  now. We can call imaging and see if they can read the report sooner.    CC'd [employer]

## 2023-07-11 ENCOUNTER — Other Ambulatory Visit: Payer: Self-pay

## 2023-07-11 ENCOUNTER — Ambulatory Visit: Payer: Medicare Other

## 2023-07-11 DIAGNOSIS — J069 Acute upper respiratory infection, unspecified: Secondary | ICD-10-CM

## 2023-07-11 MED ORDER — PROMETHAZINE-DM 6.25-15 MG/5ML PO SYRP: 5.0000 mL | ORAL_SOLUTION | Freq: Four times a day (QID) | ORAL | 0 refills | Status: DC | PRN

## 2023-07-11 NOTE — Telephone Encounter (Signed)
Patient requesting rx rf of cough medication - she states her cough is still pretty severe and is almost out of cough syrup.

## 2023-07-11 NOTE — Telephone Encounter (Signed)
Patient informed  of CXR results as well .

## 2023-07-19 ENCOUNTER — Ambulatory Visit: Payer: Medicare Other

## 2023-07-19 VITALS — BP 125/69 | HR 93

## 2023-07-19 DIAGNOSIS — M810 Age-related osteoporosis without current pathological fracture: Secondary | ICD-10-CM | POA: Diagnosis not present

## 2023-07-19 MED ORDER — DENOSUMAB 60 MG/ML ~~LOC~~ SOSY: 60.0000 mg | PREFILLED_SYRINGE | Freq: Once | SUBCUTANEOUS | Status: AC

## 2023-07-19 MED ORDER — DENOSUMAB 60 MG/ML ~~LOC~~ SOSY
60.0000 mg | PREFILLED_SYRINGE | Freq: Once | SUBCUTANEOUS | Status: AC
  Administered 2023-07-19: 60 mg via SUBCUTANEOUS

## 2023-07-19 NOTE — Progress Notes (Signed)
   Established Patient Office Visit  Subjective   Patient ID: Carol Perez, female    DOB: Sep 14, 1940  Age: 83 y.o. MRN: [id number]  Chief Complaint  Patient presents with   Osteoporosis    HPI  Carol Perez is here for Prolia  injection.   ROS    Objective:     BP 125/69   Pulse 93   SpO2 99%    Physical Exam   No results found for any visits on 07/19/23.    The ASCVD Risk score (Arnett DK, et al., 2019) failed to calculate for the following reasons:   The 2019 ASCVD risk score is only valid for ages 25 to 71    Assessment & Plan:  Prolia  injection - Patient tolerated injection well without complications. Patient advised to schedule next injection 6 months and 1 day from today. On or after 01/17/2024.  Problem List Items Addressed This Visit       Unprioritized   Osteoporosis - Primary   Relevant Medications   denosumab  (PROLIA ) injection 60 mg (Start on 01/17/2024 12:00 AM)    Return in about 26 weeks (around 01/17/2024) for Prolia  injection. SABRA Pear, Jon Mayor, CMA

## 2023-07-30 DIAGNOSIS — G309 Alzheimer's disease, unspecified: Secondary | ICD-10-CM | POA: Diagnosis not present

## 2023-08-06 ENCOUNTER — Ambulatory Visit: Payer: Medicare Other | Admitting: Family Medicine

## 2023-08-06 VITALS — BP 129/59 | HR 89 | Ht 61.0 in | Wt 138.0 lb

## 2023-08-06 DIAGNOSIS — E162 Hypoglycemia, unspecified: Secondary | ICD-10-CM

## 2023-08-06 DIAGNOSIS — R42 Dizziness and giddiness: Secondary | ICD-10-CM | POA: Diagnosis not present

## 2023-08-06 DIAGNOSIS — R7301 Impaired fasting glucose: Secondary | ICD-10-CM | POA: Diagnosis not present

## 2023-08-06 DIAGNOSIS — R0981 Nasal congestion: Secondary | ICD-10-CM

## 2023-08-06 DIAGNOSIS — G309 Alzheimer's disease, unspecified: Secondary | ICD-10-CM

## 2023-08-06 DIAGNOSIS — R252 Cramp and spasm: Secondary | ICD-10-CM | POA: Diagnosis not present

## 2023-08-06 DIAGNOSIS — F0280 Dementia in other diseases classified elsewhere without behavioral disturbance: Secondary | ICD-10-CM

## 2023-08-06 DIAGNOSIS — R350 Frequency of micturition: Secondary | ICD-10-CM

## 2023-08-06 LAB — POCT URINALYSIS DIP (CLINITEK)
Bilirubin, UA: NEGATIVE
Blood, UA: NEGATIVE
Glucose, UA: NEGATIVE mg/dL
Ketones, POC UA: NEGATIVE mg/dL
Nitrite, UA: NEGATIVE
POC PROTEIN,UA: 30 — AB
Spec Grav, UA: 1.015 (ref 1.010–1.025)
Urobilinogen, UA: 0.2 U/dL
pH, UA: 6 (ref 5.0–8.0)

## 2023-08-06 LAB — POCT GLYCOSYLATED HEMOGLOBIN (HGB A1C): Hemoglobin A1C: 5.5 % (ref 4.0–5.6)

## 2023-08-06 MED ORDER — BLOOD GLUCOSE TEST VI STRP: 1.0000 | ORAL_STRIP | Freq: Every day | 99 refills | Status: AC | PRN

## 2023-08-06 MED ORDER — BLOOD GLUCOSE MONITORING SUPPL DEVI: 1.0000 | Freq: Every day | 0 refills | Status: AC | PRN

## 2023-08-06 MED ORDER — LANCET DEVICE MISC: 1.0000 | Freq: Every day | 99 refills | Status: AC | PRN

## 2023-08-06 NOTE — Progress Notes (Unsigned)
 Established Patient Office Visit  Subjective  Patient ID: Carol Perez, female    DOB: [birth year]  Age: 83 y.o. MRN: [id number]  Chief Complaint  Patient presents with   ifg    HPI  Impaired fasting glucose-no increased thirst or urination. No symptoms consistent with hypoglycemia.  Getting epidose of nausea and feeling weak.  She says 3 times she has had an episode where she suddenly felt like a veil was coming over her almost like she was going to pass out and then started to feel shaky.  The whole episode lasted maybe 15 to 30 minutes and she felt a little nauseated with it.  This last time she drank a little bit of juice and felt better.  She was not able to check her blood pressure or her blood glucose levels.  Also reports some chronic congestion and nasal discharge since she had the flu 3 weeks ago and initially was a yellow color it is now more of a white color but she gets a lot of the drainage especially at night.  She can hear little wheezing in her upper chest at night as well.   {History (Optional):23778}  ROS    Objective:     BP (!) 129/59   Pulse 89   Ht 5\' 1"  (1.549 m)   Wt 138 lb (62.6 kg)   SpO2 97%   BMI 26.07 kg/m  {Vitals History (Optional):23777}  Physical Exam Vitals and nursing note reviewed.  Constitutional:      Appearance: Normal appearance.  HENT:     Head: Normocephalic and atraumatic.     Right Ear: Tympanic membrane, ear canal and external ear normal. There is no impacted cerumen.     Left Ear: Tympanic membrane, ear canal and external ear normal. There is no impacted cerumen.     Nose: Nose normal.     Mouth/Throat:     Pharynx: Oropharynx is clear.  Eyes:     Conjunctiva/sclera: Conjunctivae normal.  Cardiovascular:     Rate and Rhythm: Normal rate and regular rhythm.  Pulmonary:     Effort: Pulmonary effort is normal.     Breath sounds: Normal breath sounds.  Musculoskeletal:     Cervical back: Neck supple. No tenderness.   Lymphadenopathy:     Cervical: No cervical adenopathy.  Skin:    General: Skin is warm and dry.  Neurological:     Mental Status: She is alert and oriented to person, place, and time.  Psychiatric:        Mood and Affect: Mood normal.      Results for orders placed or performed in visit on 08/06/23  POCT HgB A1C  Result Value Ref Range   Hemoglobin A1C 5.5 4.0 - 5.6 %   HbA1c POC (<> result, manual entry)     HbA1c, POC (prediabetic range)     HbA1c, POC (controlled diabetic range)    POCT URINALYSIS DIP (CLINITEK)  Result Value Ref Range   Color, UA yellow yellow   Clarity, UA cloudy (A) clear   Glucose, UA negative negative mg/dL   Bilirubin, UA negative negative   Ketones, POC UA negative negative mg/dL   Spec Grav, UA 4.010 2.725 - 1.025   Blood, UA negative negative   pH, UA 6.0 5.0 - 8.0   POC PROTEIN,UA =30 (A) negative, trace   Urobilinogen, UA 0.2 0.2 or 1.0 E.U./dL   Nitrite, UA Negative Negative   Leukocytes, UA Small (1+) (A) Negative    {  Labs (Optional):23779}  The ASCVD Risk score (Arnett DK, et al., 2019) failed to calculate for the following reasons:   The 2019 ASCVD risk score is only valid for ages 50 to 98    Assessment & Plan:   Problem List Items Addressed This Visit       Endocrine   IFG (impaired fasting glucose) - Primary   A1c is stable and looks great.  Please try to check your blood sugar if you have another 1 of those spells.  I did send a prescription over to the pharmacy to see if they will cover a glucometer and lancets and strips.  If you also have access to a blood pressure machine try to check your blood pressure when this happens as well as we can make sure it is not dropping too low.      Relevant Medications   Blood Glucose Monitoring Suppl DEVI   Glucose Blood (BLOOD GLUCOSE TEST STRIPS) STRP   Lancet Device MISC   Other Relevant Orders   POCT HgB A1C (Completed)   CMP14+EGFR   TSH   CBC with Differential/Platelet    Magnesium     Nervous and Auditory   Alzheimer disease (HCC)   She is now following over at [facility] and has a follow-up in 8 weeks to discuss treatment options.  She will probably go back on the infusions.      Other Visit Diagnoses       Hypoglycemia       Relevant Medications   Blood Glucose Monitoring Suppl DEVI   Glucose Blood (BLOOD GLUCOSE TEST STRIPS) STRP   Lancet Device MISC   Other Relevant Orders   CMP14+EGFR   TSH   CBC with Differential/Platelet   Magnesium     Lightheaded       Relevant Orders   CMP14+EGFR   TSH   CBC with Differential/Platelet   Magnesium     Muscle cramp       Relevant Orders   CMP14+EGFR   TSH   CBC with Differential/Platelet   Magnesium     Urinary frequency       Relevant Orders   POCT URINALYSIS DIP (CLINITEK) (Completed)   Urine Culture     Sinus congestion           Nose congestion-we will give her send nasal saline sample to do on her own at home if she is not improving then please let us know.  Urin\ary frequency -  will check ua and culture today. No burning just frequency and urgency for about 4-week she says it started right before she got the flu.    Return in about 3 months (around 11/03/2023) for weak spells.    Nani Gasser, MD

## 2023-08-06 NOTE — Assessment & Plan Note (Signed)
 She is now following over at [facility] and has a follow-up in 8 weeks to discuss treatment options.  She will probably go back on the infusions.

## 2023-08-06 NOTE — Patient Instructions (Signed)
 Please try to check your blood sugar if you have another 1 of those spells.  I did send a prescription over to the pharmacy to see if they will cover a glucometer and lancets and strips.  If you also have access to a blood pressure machine try to check your blood pressure when this happens as well as we can make sure it is not dropping too low.

## 2023-08-06 NOTE — Assessment & Plan Note (Signed)
 A1c is stable and looks great.  Please try to check your blood sugar if you have another 1 of those spells.  I did send a prescription over to the pharmacy to see if they will cover a glucometer and lancets and strips.  If you also have access to a blood pressure machine try to check your blood pressure when this happens as well as we can make sure it is not dropping too low.

## 2023-08-07 DIAGNOSIS — R252 Cramp and spasm: Secondary | ICD-10-CM | POA: Diagnosis not present

## 2023-08-07 DIAGNOSIS — E162 Hypoglycemia, unspecified: Secondary | ICD-10-CM | POA: Diagnosis not present

## 2023-08-07 DIAGNOSIS — R42 Dizziness and giddiness: Secondary | ICD-10-CM | POA: Diagnosis not present

## 2023-08-07 DIAGNOSIS — R7301 Impaired fasting glucose: Secondary | ICD-10-CM | POA: Diagnosis not present

## 2023-08-09 LAB — CMP14+EGFR
ALT: 14 [IU]/L (ref 0–32)
AST: 25 [IU]/L (ref 0–40)
Albumin: 4.4 g/dL (ref 3.8–4.8)
Alkaline Phosphatase: 100 [IU]/L (ref 44–121)
BUN/Creatinine Ratio: 20 (ref 12–28)
BUN: 18 mg/dL (ref 8–27)
Bilirubin Total: 0.3 mg/dL (ref 0.0–1.2)
CO2: 21 mmol/L (ref 20–29)
Calcium: 9.2 mg/dL (ref 8.7–10.3)
Chloride: 100 mmol/L (ref 96–106)
Creatinine, Ser: 0.9 mg/dL (ref 0.57–1.00)
Globulin, Total: 2.3 g/dL (ref 1.5–4.5)
Glucose: 109 mg/dL — ABNORMAL HIGH (ref 70–99)
Potassium: 4.6 mmol/L (ref 3.5–5.2)
Sodium: 143 mmol/L (ref 134–144)
Total Protein: 6.7 g/dL (ref 6.0–8.5)
eGFR: 65 mL/min/{1.73_m2} (ref >59)

## 2023-08-09 LAB — URINE CULTURE

## 2023-08-09 LAB — TSH: TSH: 15.7 u[IU]/mL — ABNORMAL HIGH (ref 0.450–4.500)

## 2023-08-09 LAB — CBC WITH DIFFERENTIAL/PLATELET
Basophils Absolute: 0.1 10*3/uL (ref 0.0–0.2)
Basos: 2 %
EOS (ABSOLUTE): 0.2 10*3/uL (ref 0.0–0.4)
Eos: 4 %
Hematocrit: 38.1 % (ref 34.0–46.6)
Hemoglobin: 12.2 g/dL (ref 11.1–15.9)
Immature Grans (Abs): 0 10*3/uL (ref 0.0–0.1)
Immature Granulocytes: 0 %
Lymphocytes Absolute: 1.6 10*3/uL (ref 0.7–3.1)
Lymphs: 24 %
MCH: 31.3 pg (ref 26.6–33.0)
MCHC: 32 g/dL (ref 31.5–35.7)
MCV: 98 fL — ABNORMAL HIGH (ref 79–97)
Monocytes Absolute: 0.7 10*3/uL (ref 0.1–0.9)
Monocytes: 11 %
Neutrophils Absolute: 3.9 10*3/uL (ref 1.4–7.0)
Neutrophils: 59 %
Platelets: 281 10*3/uL (ref 150–450)
RBC: 3.9 x10E6/uL (ref 3.77–5.28)
RDW: 13.1 % (ref 11.7–15.4)
WBC: 6.6 10*3/uL (ref 3.4–10.8)

## 2023-08-09 LAB — MAGNESIUM: Magnesium: 2.1 mg/dL (ref 1.6–2.3)

## 2023-08-10 ENCOUNTER — Encounter: Payer: Self-pay | Admitting: Family Medicine

## 2023-08-10 MED ORDER — NITROFURANTOIN MONOHYD MACRO 100 MG PO CAPS: 100.0000 mg | ORAL_CAPSULE | Freq: Two times a day (BID) | ORAL | 0 refills | Status: AC

## 2023-08-10 NOTE — Progress Notes (Signed)
 Hi Carol Perez, kidney function looks better this time.  The thyroid is off.  Have you had any problems with your thyroid before?  As far as I am aware you do not take any medication for this.  We will call the lab and see if we can add a free T4 as well.  Blood count looks good.  Urine culture did come back positive for Klebsiella.  Going to send over an antibiotic to clear this up.  If you are not feeling better let me know I do think this probably could have been contributing to you just not feeling well and having some nausea so hopefully once we get that cleared up you will be feeling better.  These call the lab and see if we can add a free T4 to her blood work.

## 2023-09-13 DIAGNOSIS — R194 Change in bowel habit: Secondary | ICD-10-CM | POA: Diagnosis not present

## 2023-09-13 DIAGNOSIS — R1031 Right lower quadrant pain: Secondary | ICD-10-CM | POA: Diagnosis not present

## 2023-09-13 DIAGNOSIS — G309 Alzheimer's disease, unspecified: Secondary | ICD-10-CM | POA: Diagnosis not present

## 2023-09-13 DIAGNOSIS — K5900 Constipation, unspecified: Secondary | ICD-10-CM | POA: Diagnosis not present

## 2023-09-13 DIAGNOSIS — K219 Gastro-esophageal reflux disease without esophagitis: Secondary | ICD-10-CM | POA: Diagnosis not present

## 2023-09-13 DIAGNOSIS — K22719 Barrett's esophagus with dysplasia, unspecified: Secondary | ICD-10-CM | POA: Diagnosis not present

## 2023-09-14 ENCOUNTER — Ambulatory Visit: Payer: Self-pay | Admitting: Family Medicine

## 2023-09-14 DIAGNOSIS — N3001 Acute cystitis with hematuria: Secondary | ICD-10-CM | POA: Diagnosis not present

## 2023-09-14 DIAGNOSIS — R309 Painful micturition, unspecified: Secondary | ICD-10-CM | POA: Diagnosis not present

## 2023-09-14 NOTE — Telephone Encounter (Signed)
  Chief Complaint: Pt called back in after the call got disconnected while talking with another triage nurse.   She is c/o urinary symptoms Symptoms: Burning, pain, cloudy urine.  Symptoms becoming worse even with Cystex Frequency: Started burning yesterday Pertinent Negatives: Patient denies blood in urine Disposition: [] ED /[x] Urgent Care (no appt availability in office) / [] Appointment(In office/virtual)/ []  [facility]/ [] Home Care/ [] Refused Recommended Disposition /[] [facility]/ []  Follow-up with PCP Additional Notes: No appts available with PCP or other providers there so referred to the urgent care which she was agreeable to doing.

## 2023-09-14 NOTE — Telephone Encounter (Signed)
 Reason for Disposition . Age > 50 years  Protocols used: Urination Pain - Female-A-AH

## 2023-09-14 NOTE — Telephone Encounter (Signed)
 The call dropped in the middle of call. This RN made first attempt to contact pt with no answer and lvm.   Chief Complaint: Urination pain since yesterday Symptoms: Urinary frequency, "burning" pain 6/10  Pertinent Negatives: Patient denies fever, blood in urine Disposition: [] ED /[] Urgent Care (no appt availability in office) / [] Appointment(In office/virtual)/ []  [facility]/ [] Home Care/ [] Refused Recommended Disposition /[] [facility]/ []  Follow-up with PCP Additional Notes: Pt started taking Cystex yesterday but she is still having symptoms.   Copied from CRM [id number]. Topic: Clinical - Red Word Triage >> Sep 14, 2023  3:18 PM Suzette B wrote: Patient is having problems with urinating, burning, pain, cloudy urination has taken cystex but getting worse Answer Assessment - Initial Assessment Questions SEVERITY: "How bad is the pain?"  (e.g., Scale 1-10; mild, moderate, or severe)   - MILD (1-3): complains slightly about urination hurting   - MODERATE (4-7): interferes with normal activities     - SEVERE (8-10): excruciating, unwilling or unable to urinate because of the pain      6/10 with urination  Protocols used: Urination Pain - [facility]

## 2023-09-17 DIAGNOSIS — G309 Alzheimer's disease, unspecified: Secondary | ICD-10-CM | POA: Diagnosis not present

## 2023-09-26 DIAGNOSIS — Z8673 Personal history of transient ischemic attack (TIA), and cerebral infarction without residual deficits: Secondary | ICD-10-CM | POA: Diagnosis not present

## 2023-09-26 DIAGNOSIS — G3184 Mild cognitive impairment, so stated: Secondary | ICD-10-CM | POA: Diagnosis not present

## 2023-09-26 DIAGNOSIS — F5102 Adjustment insomnia: Secondary | ICD-10-CM | POA: Diagnosis not present

## 2023-09-26 DIAGNOSIS — K22719 Barrett's esophagus with dysplasia, unspecified: Secondary | ICD-10-CM | POA: Diagnosis not present

## 2023-09-26 DIAGNOSIS — J301 Allergic rhinitis due to pollen: Secondary | ICD-10-CM | POA: Diagnosis not present

## 2023-09-26 DIAGNOSIS — N390 Urinary tract infection, site not specified: Secondary | ICD-10-CM | POA: Diagnosis not present

## 2023-09-26 DIAGNOSIS — D6851 Activated protein C resistance: Secondary | ICD-10-CM | POA: Diagnosis not present

## 2023-09-26 DIAGNOSIS — M810 Age-related osteoporosis without current pathological fracture: Secondary | ICD-10-CM | POA: Diagnosis not present

## 2023-10-04 DIAGNOSIS — G309 Alzheimer's disease, unspecified: Secondary | ICD-10-CM | POA: Diagnosis not present

## 2023-10-04 DIAGNOSIS — Z79899 Other long term (current) drug therapy: Secondary | ICD-10-CM | POA: Diagnosis not present

## 2023-10-09 DIAGNOSIS — R300 Dysuria: Secondary | ICD-10-CM | POA: Diagnosis not present

## 2023-10-10 DIAGNOSIS — R058 Other specified cough: Secondary | ICD-10-CM | POA: Diagnosis not present

## 2023-10-10 DIAGNOSIS — G3184 Mild cognitive impairment, so stated: Secondary | ICD-10-CM | POA: Diagnosis not present

## 2023-10-10 DIAGNOSIS — G4762 Sleep related leg cramps: Secondary | ICD-10-CM | POA: Diagnosis not present

## 2023-10-11 DIAGNOSIS — R0989 Other specified symptoms and signs involving the circulatory and respiratory systems: Secondary | ICD-10-CM | POA: Diagnosis not present

## 2023-10-11 DIAGNOSIS — R059 Cough, unspecified: Secondary | ICD-10-CM | POA: Diagnosis not present

## 2023-10-17 DIAGNOSIS — J301 Allergic rhinitis due to pollen: Secondary | ICD-10-CM | POA: Diagnosis not present

## 2023-10-17 DIAGNOSIS — G3184 Mild cognitive impairment, so stated: Secondary | ICD-10-CM | POA: Diagnosis not present

## 2023-10-23 DIAGNOSIS — G301 Alzheimer's disease with late onset: Secondary | ICD-10-CM | POA: Diagnosis not present

## 2023-10-31 DIAGNOSIS — K22719 Barrett's esophagus with dysplasia, unspecified: Secondary | ICD-10-CM | POA: Diagnosis not present

## 2023-11-06 ENCOUNTER — Ambulatory Visit: Payer: Medicare Other | Admitting: Family Medicine

## 2023-11-06 DIAGNOSIS — G301 Alzheimer's disease with late onset: Secondary | ICD-10-CM | POA: Diagnosis not present

## 2023-11-07 DIAGNOSIS — J301 Allergic rhinitis due to pollen: Secondary | ICD-10-CM | POA: Diagnosis not present

## 2023-11-07 DIAGNOSIS — G4762 Sleep related leg cramps: Secondary | ICD-10-CM | POA: Diagnosis not present

## 2023-11-07 DIAGNOSIS — K22719 Barrett's esophagus with dysplasia, unspecified: Secondary | ICD-10-CM | POA: Diagnosis not present

## 2023-11-07 DIAGNOSIS — R058 Other specified cough: Secondary | ICD-10-CM | POA: Diagnosis not present

## 2023-11-18 DIAGNOSIS — R0789 Other chest pain: Secondary | ICD-10-CM | POA: Diagnosis not present

## 2023-11-18 DIAGNOSIS — R072 Precordial pain: Secondary | ICD-10-CM | POA: Diagnosis not present

## 2023-11-18 DIAGNOSIS — R12 Heartburn: Secondary | ICD-10-CM | POA: Diagnosis not present

## 2023-11-18 DIAGNOSIS — R252 Cramp and spasm: Secondary | ICD-10-CM | POA: Diagnosis not present

## 2023-11-18 DIAGNOSIS — R140 Abdominal distension (gaseous): Secondary | ICD-10-CM | POA: Diagnosis not present

## 2023-11-18 DIAGNOSIS — I340 Nonrheumatic mitral (valve) insufficiency: Secondary | ICD-10-CM | POA: Diagnosis not present

## 2023-11-18 DIAGNOSIS — K449 Diaphragmatic hernia without obstruction or gangrene: Secondary | ICD-10-CM | POA: Diagnosis not present

## 2023-11-18 DIAGNOSIS — M79605 Pain in left leg: Secondary | ICD-10-CM | POA: Diagnosis not present

## 2023-11-18 DIAGNOSIS — I3481 Nonrheumatic mitral (valve) annulus calcification: Secondary | ICD-10-CM | POA: Diagnosis not present

## 2023-11-19 DIAGNOSIS — R079 Chest pain, unspecified: Secondary | ICD-10-CM | POA: Diagnosis not present

## 2023-11-19 DIAGNOSIS — R0602 Shortness of breath: Secondary | ICD-10-CM | POA: Diagnosis not present

## 2023-11-19 DIAGNOSIS — I340 Nonrheumatic mitral (valve) insufficiency: Secondary | ICD-10-CM | POA: Diagnosis not present

## 2023-11-19 DIAGNOSIS — I3481 Nonrheumatic mitral (valve) annulus calcification: Secondary | ICD-10-CM | POA: Diagnosis not present

## 2023-11-19 DIAGNOSIS — K449 Diaphragmatic hernia without obstruction or gangrene: Secondary | ICD-10-CM | POA: Diagnosis not present

## 2023-11-19 DIAGNOSIS — R072 Precordial pain: Secondary | ICD-10-CM | POA: Diagnosis not present

## 2023-11-20 DIAGNOSIS — G301 Alzheimer's disease with late onset: Secondary | ICD-10-CM | POA: Diagnosis not present

## 2023-12-03 DIAGNOSIS — K449 Diaphragmatic hernia without obstruction or gangrene: Secondary | ICD-10-CM | POA: Diagnosis not present

## 2023-12-03 DIAGNOSIS — K2210 Ulcer of esophagus without bleeding: Secondary | ICD-10-CM | POA: Diagnosis not present

## 2023-12-03 DIAGNOSIS — K2270 Barrett's esophagus without dysplasia: Secondary | ICD-10-CM | POA: Diagnosis not present

## 2023-12-03 DIAGNOSIS — K219 Gastro-esophageal reflux disease without esophagitis: Secondary | ICD-10-CM | POA: Diagnosis not present

## 2023-12-03 DIAGNOSIS — K2289 Other specified disease of esophagus: Secondary | ICD-10-CM | POA: Diagnosis not present

## 2023-12-04 DIAGNOSIS — G301 Alzheimer's disease with late onset: Secondary | ICD-10-CM | POA: Diagnosis not present

## 2023-12-05 DIAGNOSIS — G3184 Mild cognitive impairment, so stated: Secondary | ICD-10-CM | POA: Diagnosis not present

## 2023-12-05 DIAGNOSIS — J301 Allergic rhinitis due to pollen: Secondary | ICD-10-CM | POA: Diagnosis not present

## 2023-12-05 DIAGNOSIS — K22719 Barrett's esophagus with dysplasia, unspecified: Secondary | ICD-10-CM | POA: Diagnosis not present

## 2023-12-13 DIAGNOSIS — H02885 Meibomian gland dysfunction left lower eyelid: Secondary | ICD-10-CM | POA: Diagnosis not present

## 2023-12-13 DIAGNOSIS — H527 Unspecified disorder of refraction: Secondary | ICD-10-CM | POA: Diagnosis not present

## 2023-12-13 DIAGNOSIS — H43813 Vitreous degeneration, bilateral: Secondary | ICD-10-CM | POA: Diagnosis not present

## 2023-12-13 DIAGNOSIS — H02882 Meibomian gland dysfunction right lower eyelid: Secondary | ICD-10-CM | POA: Diagnosis not present

## 2023-12-13 DIAGNOSIS — H04123 Dry eye syndrome of bilateral lacrimal glands: Secondary | ICD-10-CM | POA: Diagnosis not present

## 2023-12-17 ENCOUNTER — Other Ambulatory Visit (HOSPITAL_COMMUNITY): Payer: Self-pay

## 2023-12-17 ENCOUNTER — Telehealth: Payer: Self-pay

## 2023-12-17 NOTE — Telephone Encounter (Signed)
 Carol Perez

## 2023-12-17 NOTE — Telephone Encounter (Signed)
 Pt ready for scheduling for PROLIA  on or after : 01/16/24  Option# 1: Buy/Bill (Office supplied medication)  Out-of-pocket cost due at time of clinic visit: $362  Number of injection/visits approved: 2  Primary: [employer] MEDICARE Prolia  co-insurance: 20% Admin fee co-insurance: $30  Secondary: --- Prolia  co-insurance:  Admin fee co-insurance:   Medical Benefit Details: Date Benefits were checked: 12/17/23 Deductible: NO/ Coinsurance: 20%/ Admin Fee: $30  Prior Auth: APPROVED PA# [id number] Expiration Date: 07/04/23-07/03/24  # of doses approved: 2 ----------------------------------------------------------------------- Option# 2- Med Obtained from pharmacy:  Pharmacy benefit: Copay $300 (Paid to pharmacy) Admin Fee: $30 (Pay at clinic)  Prior Auth: N/A PA# Expiration Date:   # of doses approved:   If patient wants fill through the pharmacy benefit please send prescription to: [email or web address], and include estimated need by date in rx notes. Pharmacy will ship medication directly to the office.  Patient NOT eligible for Prolia  Copay Card. Copay Card can make patient's cost as little as $25. Link to apply: [email or web address]  ** This summary of benefits is an estimation of the patient's out-of-pocket cost. Exact cost may very based on individual plan coverage.

## 2023-12-17 NOTE — Telephone Encounter (Signed)
 Prolia  VOB initiated via [email or web address]  Next Prolia  inj DUE: 01/16/24

## 2023-12-18 DIAGNOSIS — G301 Alzheimer's disease with late onset: Secondary | ICD-10-CM | POA: Diagnosis not present

## 2023-12-26 DIAGNOSIS — K22719 Barrett's esophagus with dysplasia, unspecified: Secondary | ICD-10-CM | POA: Diagnosis not present

## 2023-12-26 DIAGNOSIS — G4762 Sleep related leg cramps: Secondary | ICD-10-CM | POA: Diagnosis not present

## 2023-12-26 DIAGNOSIS — R300 Dysuria: Secondary | ICD-10-CM | POA: Diagnosis not present

## 2023-12-26 DIAGNOSIS — N390 Urinary tract infection, site not specified: Secondary | ICD-10-CM | POA: Diagnosis not present

## 2024-01-01 DIAGNOSIS — G301 Alzheimer's disease with late onset: Secondary | ICD-10-CM | POA: Diagnosis not present

## 2024-01-15 DIAGNOSIS — G301 Alzheimer's disease with late onset: Secondary | ICD-10-CM | POA: Diagnosis not present

## 2024-01-15 DIAGNOSIS — Z006 Encounter for examination for normal comparison and control in clinical research program: Secondary | ICD-10-CM | POA: Diagnosis not present

## 2024-01-23 DIAGNOSIS — M810 Age-related osteoporosis without current pathological fracture: Secondary | ICD-10-CM | POA: Diagnosis not present

## 2024-01-23 DIAGNOSIS — M545 Low back pain, unspecified: Secondary | ICD-10-CM | POA: Diagnosis not present

## 2024-01-23 DIAGNOSIS — N390 Urinary tract infection, site not specified: Secondary | ICD-10-CM | POA: Diagnosis not present

## 2024-01-23 DIAGNOSIS — G3184 Mild cognitive impairment, so stated: Secondary | ICD-10-CM | POA: Diagnosis not present

## 2024-01-23 DIAGNOSIS — M48062 Spinal stenosis, lumbar region with neurogenic claudication: Secondary | ICD-10-CM | POA: Diagnosis not present

## 2024-01-29 DIAGNOSIS — Z006 Encounter for examination for normal comparison and control in clinical research program: Secondary | ICD-10-CM | POA: Diagnosis not present

## 2024-01-29 DIAGNOSIS — G301 Alzheimer's disease with late onset: Secondary | ICD-10-CM | POA: Diagnosis not present

## 2024-01-31 DIAGNOSIS — G301 Alzheimer's disease with late onset: Secondary | ICD-10-CM | POA: Diagnosis not present

## 2024-02-12 ENCOUNTER — Encounter: Payer: Self-pay | Admitting: Sports Medicine

## 2024-02-18 ENCOUNTER — Other Ambulatory Visit: Payer: Self-pay | Admitting: Family Medicine

## 2024-02-18 DIAGNOSIS — F321 Major depressive disorder, single episode, moderate: Secondary | ICD-10-CM

## 2024-02-19 ENCOUNTER — Other Ambulatory Visit: Payer: Self-pay | Admitting: Family Medicine

## 2024-02-19 DIAGNOSIS — F321 Major depressive disorder, single episode, moderate: Secondary | ICD-10-CM

## 2024-02-20 DIAGNOSIS — G3184 Mild cognitive impairment, so stated: Secondary | ICD-10-CM | POA: Diagnosis not present

## 2024-02-20 DIAGNOSIS — M48062 Spinal stenosis, lumbar region with neurogenic claudication: Secondary | ICD-10-CM | POA: Diagnosis not present

## 2024-02-28 ENCOUNTER — Other Ambulatory Visit: Payer: Self-pay | Admitting: Family Medicine

## 2024-02-28 DIAGNOSIS — E785 Hyperlipidemia, unspecified: Secondary | ICD-10-CM

## 2024-03-31 DIAGNOSIS — G309 Alzheimer's disease, unspecified: Secondary | ICD-10-CM | POA: Diagnosis not present

## 2024-03-31 DIAGNOSIS — Z79620 Long term (current) use of immunosuppressive biologic: Secondary | ICD-10-CM | POA: Diagnosis not present

## 2024-03-31 DIAGNOSIS — Z5181 Encounter for therapeutic drug level monitoring: Secondary | ICD-10-CM | POA: Diagnosis not present

## 2024-04-01 DIAGNOSIS — G301 Alzheimer's disease with late onset: Secondary | ICD-10-CM | POA: Diagnosis not present

## 2024-04-01 DIAGNOSIS — F5102 Adjustment insomnia: Secondary | ICD-10-CM | POA: Diagnosis not present

## 2024-04-02 DIAGNOSIS — K22719 Barrett's esophagus with dysplasia, unspecified: Secondary | ICD-10-CM | POA: Diagnosis not present

## 2024-04-08 DIAGNOSIS — H04123 Dry eye syndrome of bilateral lacrimal glands: Secondary | ICD-10-CM | POA: Diagnosis not present

## 2024-04-08 DIAGNOSIS — H02882 Meibomian gland dysfunction right lower eyelid: Secondary | ICD-10-CM | POA: Diagnosis not present

## 2024-04-08 DIAGNOSIS — H02885 Meibomian gland dysfunction left lower eyelid: Secondary | ICD-10-CM | POA: Diagnosis not present
